# Patient Record
Sex: Male | Born: 1990
Health system: Southern US, Community
[De-identification: ages and names within clinical notes are randomized; demographics above are authoritative.]

## PROBLEM LIST (undated history)

## (undated) DIAGNOSIS — F3181 Bipolar II disorder: Secondary | ICD-10-CM

## (undated) DIAGNOSIS — G4700 Insomnia, unspecified: Secondary | ICD-10-CM

## (undated) DIAGNOSIS — G4730 Sleep apnea, unspecified: Secondary | ICD-10-CM

## (undated) DIAGNOSIS — F32A Depression, unspecified: Secondary | ICD-10-CM

## (undated) DIAGNOSIS — E785 Hyperlipidemia, unspecified: Secondary | ICD-10-CM

## (undated) DIAGNOSIS — F419 Anxiety disorder, unspecified: Secondary | ICD-10-CM

## (undated) DIAGNOSIS — F329 Major depressive disorder, single episode, unspecified: Secondary | ICD-10-CM

## (undated) DIAGNOSIS — F909 Attention-deficit hyperactivity disorder, unspecified type: Secondary | ICD-10-CM

## (undated) HISTORY — DX: Depression, unspecified: F32.A

## (undated) HISTORY — PX: OTHER SURGICAL HISTORY: SHX169

## (undated) HISTORY — DX: Major depressive disorder, single episode, unspecified: F32.9

## (undated) HISTORY — DX: Anxiety disorder, unspecified: F41.9

---
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
## 2011-02-21 ENCOUNTER — Ambulatory Visit (INDEPENDENT_AMBULATORY_CARE_PROVIDER_SITE_OTHER): Payer: BC Managed Care – PPO | Admitting: Internal Medicine

## 2011-02-21 ENCOUNTER — Other Ambulatory Visit (INDEPENDENT_AMBULATORY_CARE_PROVIDER_SITE_OTHER): Payer: BC Managed Care – PPO

## 2011-02-21 ENCOUNTER — Encounter: Payer: Self-pay | Admitting: Internal Medicine

## 2011-02-21 VITALS — BP 108/80 | HR 97 | Temp 97.4°F | Wt 172.0 lb

## 2011-02-21 DIAGNOSIS — Z0000 Encounter for general adult medical examination without abnormal findings: Secondary | ICD-10-CM

## 2011-02-21 DIAGNOSIS — L858 Other specified epidermal thickening: Secondary | ICD-10-CM | POA: Insufficient documentation

## 2011-02-21 DIAGNOSIS — F411 Generalized anxiety disorder: Secondary | ICD-10-CM

## 2011-02-21 DIAGNOSIS — F419 Anxiety disorder, unspecified: Secondary | ICD-10-CM

## 2011-02-21 DIAGNOSIS — Z79899 Other long term (current) drug therapy: Secondary | ICD-10-CM | POA: Insufficient documentation

## 2011-02-21 LAB — CBC WITH DIFFERENTIAL/PLATELET
Basophils Absolute: 0 10*3/uL (ref 0.0–0.1)
Basophils Relative: 0.6 % (ref 0.0–3.0)
Eosinophils Absolute: 0.1 10*3/uL (ref 0.0–0.7)
Eosinophils Relative: 0.8 % (ref 0.0–5.0)
HCT: 44.8 % (ref 39.0–52.0)
Hemoglobin: 15 g/dL (ref 13.0–17.0)
Lymphocytes Relative: 32.4 % (ref 12.0–46.0)
Lymphs Abs: 2.6 10*3/uL (ref 0.7–4.0)
MCHC: 33.5 g/dL (ref 30.0–36.0)
MCV: 86.2 fl (ref 78.0–100.0)
Monocytes Absolute: 0.5 10*3/uL (ref 0.1–1.0)
Monocytes Relative: 6.7 % (ref 3.0–12.0)
Neutro Abs: 4.8 10*3/uL (ref 1.4–7.7)
Neutrophils Relative %: 59.5 % (ref 43.0–77.0)
Platelets: 362 10*3/uL (ref 150.0–400.0)
RBC: 5.2 Mil/uL (ref 4.22–5.81)
RDW: 12.7 % (ref 11.5–14.6)
WBC: 8.1 10*3/uL (ref 4.5–10.5)

## 2011-02-21 LAB — LIPID PANEL
Cholesterol: 198 mg/dL (ref 0–200)
HDL: 46.3 mg/dL (ref >39.00)
LDL Cholesterol: 121 mg/dL — ABNORMAL HIGH (ref 0–99)
Total CHOL/HDL Ratio: 4
Triglycerides: 152 mg/dL — ABNORMAL HIGH (ref 0.0–149.0)
VLDL: 30.4 mg/dL (ref 0.0–40.0)

## 2011-02-21 MED ORDER — AMMONIUM LACTATE 12 % EX LOTN: TOPICAL_LOTION | Freq: Every day | CUTANEOUS | Status: DC

## 2011-02-21 NOTE — Patient Instructions (Signed)
Health Maintenance in Males MAINTAIN REGULAR HEALTH EXAMS  Maintain a healthy diet and normal weight. Increased weight leads to problems with blood pressure and diabetes. Decrease fat in the diet and increase exercise. Obtain a proper diet from your caregiver if necessary.   High blood pressure causes heart and blood vessel problems. Check blood pressures regularly and keep your blood pressure at normal limits. Aerobic exercise helps this. Persistent elevations of blood pressure should be treated with medications if weight loss and exercise are ineffective.   Avoid smoking, drinking in excess (more than 2 drinks per day), or use of street drugs. Do not share needles with anyone. Ask for help if you need assistance or instructions on stopping the use of alcohol, cigarettes, or drugs.   Maintain normal blood lipids and cholesterol. Your caregiver can give you information to lower your risk of heart disease or stroke.   Ask your caregiver if you are in need of early heart disease screening because of a strong family history of heart disease or signs of elevated testosterone (male sex hormone) levels. These can predispose you to early heart disease.   Practice safe sex. Practicing safe sex decreases your risk for a sexually transmitted infection (STI). Some of the STIs are gonorrhea, chlamydia, syphilis, trichimonas, herpes, human papillomavirus (HPV), and human immunodeficiency virus (HIV). Herpes, HIV, and HPV are viral illnesses that have no cure. These can result in disability, cancer, and death.   It is not safe for someone who has AIDS or is HIV positive to have unprotected sex with a partner who is HIV positive. The reason for this is the fact that there are many different strains of HIV. If you have a strain that is readily treated with medications and then suddenly introduce a strain from a partner that has no further treatment options, you may suddenly have a strain of HIV that is untreatable.  Even if you are both positive for HIV, it is still necessary to practice safe sex.   Use sunscreen with a SPF of 15 or greater. Being outside in the sun when your shadow caused by the sun is shorter than you are, means you are being exposed to sun at greater intensity. Lighter skinned people are at a greater risk of skin cancer.   Keep carbon monoxide and smoke detectors in your home and functioning at all times. Change the batteries every 6 months.   Do monthly examinations of your testicles. The best time to do this is after a hot shower or bath when the tissues are loose. Notify your caregivers of any lumps, tenderness, or changes in size or shape.   Notify your caregiver of new moles or changes in moles, especially if there is a change in shape or color. Also notify your caregiver if a mole is larger than the size of a pencil eraser.   Stay current with your tetanus shots and other required immunizations.  The Body Mass Index (BMI) is a way of measuring how much of your body is fat. Having a BMI above 27 increases the risk of heart disease, diabetes, hypertension, stroke, and other problems related to obesity. Document Released: 01/05/2008 Document Re-Released: 12/27/2009 [employer] Patient Information 2011 [employer]. 

## 2011-02-22 LAB — COMPREHENSIVE METABOLIC PANEL
AST: 18 U/L (ref 0–37)
Alkaline Phosphatase: 52 U/L (ref 39–117)
BUN: 10 mg/dL (ref 6–23)
Creatinine, Ser: 0.8 mg/dL (ref 0.4–1.5)
Total Bilirubin: 0.6 mg/dL (ref 0.3–1.2)

## 2011-02-22 NOTE — Assessment & Plan Note (Signed)
Labs ordered.

## 2011-02-22 NOTE — Assessment & Plan Note (Signed)
Exam done, labs ordered, and pt ed material was given 

## 2011-02-22 NOTE — Assessment & Plan Note (Signed)
Try lac-hydrin lotion

## 2011-02-22 NOTE — Progress Notes (Signed)
  Subjective:    Patient ID: Alexander Harrington, male    DOB: 1990/11/16, 20 y.o.   MRN: [id number]  HPI New to me for a complete physical, his only complaint is a long history of rash on his upper arms.    Review of Systems  Constitutional: Negative for fever, chills, diaphoresis, activity change, appetite change, fatigue and unexpected weight change.  HENT: Negative.   Eyes: Negative.   Respiratory: Negative.   Cardiovascular: Negative.   Gastrointestinal: Negative.   Genitourinary: Negative.   Musculoskeletal: Negative.   Skin: Positive for rash. Negative for color change, pallor and wound.  Neurological: Negative.   Hematological: Negative.   Psychiatric/Behavioral: Negative for suicidal ideas, hallucinations, behavioral problems, confusion, sleep disturbance, self-injury, dysphoric mood, decreased concentration and agitation. The patient is nervous/anxious. The patient is not hyperactive.        Objective:   Physical Exam  Vitals reviewed. Constitutional: He is oriented to person, place, and time. He appears well-developed and well-nourished. No distress.  HENT:  Head: Normocephalic and atraumatic.  Right Ear: External ear normal.  Left Ear: External ear normal.  Nose: Nose normal.  Mouth/Throat: Oropharynx is clear and moist. No oropharyngeal exudate.  Eyes: Conjunctivae and EOM are normal. Pupils are equal, round, and reactive to light. Right eye exhibits no discharge. Left eye exhibits no discharge. No scleral icterus.  Neck: Normal range of motion. Neck supple. No JVD present. No tracheal deviation present. No thyromegaly present.  Cardiovascular: Normal rate, regular rhythm, normal heart sounds and intact distal pulses.  Exam reveals no gallop and no friction rub.   No murmur heard. Pulmonary/Chest: Effort normal and breath sounds normal. No stridor. No respiratory distress. He has no wheezes. He has no rales. He exhibits no tenderness.  Abdominal: Soft. Bowel sounds are  normal. He exhibits no distension and no mass. There is no tenderness. There is no rebound and no guarding. Hernia confirmed negative in the right inguinal area and confirmed negative in the left inguinal area.  Genitourinary: Testes normal and penis normal. Right testis shows no mass, no swelling and no tenderness. Right testis is descended. Cremasteric reflex is not absent on the right side. Left testis shows no mass, no swelling and no tenderness. Left testis is descended. Cremasteric reflex is not absent on the left side. Circumcised. No penile tenderness. No discharge found.  Musculoskeletal: Normal range of motion. He exhibits no edema and no tenderness.  Lymphadenopathy:    He has no cervical adenopathy.       Right: No inguinal adenopathy present.       Left: No inguinal adenopathy present.  Neurological: He is alert and oriented to person, place, and time. He has normal reflexes. He displays normal reflexes. No cranial nerve deficit. He exhibits normal muscle tone. Coordination normal.  Skin: Skin is warm, dry and intact. Rash noted. No abrasion, no bruising, no burn, no ecchymosis, no lesion, no petechiae and no purpura noted. Rash is papular. Rash is not macular, not nodular, not pustular, not vesicular and not urticarial. He is not diaphoretic. No cyanosis or erythema. No pallor. Nails show no clubbing.       He has hyperpigmented perifollicular lesions on both of his upper arms  Psychiatric: He has a normal mood and affect. His behavior is normal. Judgment and thought content normal.          Assessment & Plan:

## 2011-02-22 NOTE — Assessment & Plan Note (Signed)
Cont f/up with Dr. Evelene Croon

## 2011-02-23 ENCOUNTER — Encounter: Payer: Self-pay | Admitting: Internal Medicine

## 2011-02-24 LAB — LAMOTRIGINE LEVEL: Lamotrigine Lvl: 7.2 ug/mL (ref 4.0–18.0)

## 2011-11-28 ENCOUNTER — Encounter (HOSPITAL_COMMUNITY): Payer: Self-pay | Admitting: Emergency Medicine

## 2011-11-28 ENCOUNTER — Emergency Department (HOSPITAL_COMMUNITY)
Admission: EM | Admit: 2011-11-28 | Discharge: 2011-11-28 | Disposition: A | Discharge status: Home / Self Care | Payer: BC Managed Care – PPO | Attending: Emergency Medicine | Admitting: Emergency Medicine

## 2011-11-28 ENCOUNTER — Emergency Department (HOSPITAL_COMMUNITY): Payer: BC Managed Care – PPO

## 2011-11-28 DIAGNOSIS — Z79899 Other long term (current) drug therapy: Secondary | ICD-10-CM | POA: Insufficient documentation

## 2011-11-28 DIAGNOSIS — F341 Dysthymic disorder: Secondary | ICD-10-CM | POA: Insufficient documentation

## 2011-11-28 DIAGNOSIS — R05 Cough: Secondary | ICD-10-CM | POA: Insufficient documentation

## 2011-11-28 DIAGNOSIS — R1031 Right lower quadrant pain: Secondary | ICD-10-CM | POA: Insufficient documentation

## 2011-11-28 DIAGNOSIS — R112 Nausea with vomiting, unspecified: Secondary | ICD-10-CM | POA: Insufficient documentation

## 2011-11-28 DIAGNOSIS — R5381 Other malaise: Secondary | ICD-10-CM | POA: Insufficient documentation

## 2011-11-28 DIAGNOSIS — R059 Cough, unspecified: Secondary | ICD-10-CM | POA: Insufficient documentation

## 2011-11-28 DIAGNOSIS — J189 Pneumonia, unspecified organism: Secondary | ICD-10-CM

## 2011-11-28 DIAGNOSIS — R509 Fever, unspecified: Secondary | ICD-10-CM | POA: Insufficient documentation

## 2011-11-28 LAB — BASIC METABOLIC PANEL
BUN: 9 mg/dL (ref 6–23)
Chloride: 101 mEq/L (ref 96–112)
GFR calc Af Amer: >90 mL/min (ref >90)
GFR calc non Af Amer: >90 mL/min (ref >90)
Glucose, Bld: 98 mg/dL (ref 70–99)
Potassium: 3.4 mEq/L — ABNORMAL LOW (ref 3.5–5.1)
Sodium: 136 mEq/L (ref 135–145)

## 2011-11-28 LAB — URINALYSIS, ROUTINE W REFLEX MICROSCOPIC
Hgb urine dipstick: NEGATIVE
Nitrite: NEGATIVE
Specific Gravity, Urine: 1.009 (ref 1.005–1.030)
Urobilinogen, UA: 0.2 mg/dL (ref 0.0–1.0)
pH: 6.5 (ref 5.0–8.0)

## 2011-11-28 LAB — DIFFERENTIAL
Eosinophils Absolute: 0.2 10*3/uL (ref 0.0–0.7)
Lymphocytes Relative: 7 % — ABNORMAL LOW (ref 12–46)
Lymphs Abs: 1.7 10*3/uL (ref 0.7–4.0)
Monocytes Relative: 8 % (ref 3–12)
Neutro Abs: 19.1 10*3/uL — ABNORMAL HIGH (ref 1.7–7.7)
Neutrophils Relative %: 83 % — ABNORMAL HIGH (ref 43–77)

## 2011-11-28 LAB — CBC
Hemoglobin: 14.3 g/dL (ref 13.0–17.0)
MCH: 28.4 pg (ref 26.0–34.0)
Platelets: 324 10*3/uL (ref 150–400)
RBC: 5.03 MIL/uL (ref 4.22–5.81)
WBC: 22.8 10*3/uL — ABNORMAL HIGH (ref 4.0–10.5)

## 2011-11-28 MED ORDER — IOHEXOL 300 MG/ML  SOLN
100.0000 mL | Freq: Once | INTRAMUSCULAR | Status: AC | PRN
  Administered 2011-11-28: 100 mL via INTRAVENOUS

## 2011-11-28 MED ORDER — ONDANSETRON HCL 4 MG/2ML IJ SOLN
4.0000 mg | Freq: Once | INTRAMUSCULAR | Status: AC
  Administered 2011-11-28: 4 mg via INTRAVENOUS
  Filled 2011-11-28: qty 2

## 2011-11-28 MED ORDER — AZITHROMYCIN 250 MG PO TABS: ORAL_TABLET | ORAL | Status: AC

## 2011-11-28 MED ORDER — HYDROMORPHONE HCL PF 1 MG/ML IJ SOLN
1.0000 mg | Freq: Once | INTRAMUSCULAR | Status: AC
  Administered 2011-11-28: 1 mg via INTRAVENOUS
  Filled 2011-11-28: qty 1

## 2011-11-28 MED ORDER — SODIUM CHLORIDE 0.9 % IV BOLUS (SEPSIS)
1000.0000 mL | Freq: Once | INTRAVENOUS | Status: AC
  Administered 2011-11-28: 1000 mL via INTRAVENOUS

## 2011-11-28 MED ORDER — AZITHROMYCIN 250 MG PO TABS
500.0000 mg | ORAL_TABLET | Freq: Once | ORAL | Status: AC
  Administered 2011-11-28: 500 mg via ORAL
  Filled 2011-11-28: qty 2

## 2011-11-28 NOTE — ED Notes (Signed)
Pt transported to CT ?

## 2011-11-28 NOTE — ED Provider Notes (Signed)
Medical screening examination/treatment/procedure(s) were performed by non-physician practitioner and as supervising physician I was immediately available for consultation/collaboration.  Leler Brion, MD 11/28/11 2350 

## 2011-11-28 NOTE — ED Provider Notes (Signed)
History     CSN: [id number]  Arrival date & time 11/28/11  1443   None     Chief Complaint  Patient presents with  . Nausea  . Emesis  . elevated WBC     (Consider location/radiation/quality/duration/timing/severity/associated sxs/prior treatment) HPI  Patient presents to the ED with abdominal pain. He states that he has been having fevers, nausea, RLQ pain, some weakness for the past couple of days. He went to his [facility] clinic and was told that he may have appendicitis and that his white count was elevated and that he needed to go to the ED to be evaluated and get a scan. The patient states that his pain has been intermittent sharp pains. He does have a mild cough.His temperature is 99.4 degrees. He is in no acute distress and is answering questions appropriately.   Past Medical History  Diagnosis Date  . Depression   . Anxiety     Dr. Evelene Croon    History reviewed. No pertinent past surgical history.  Family History  Problem Relation Age of Onset  . Arthritis Other   . Hyperlipidemia Other   . Stroke Other   . Heart disease Other   . Mental illness Other     History  Substance Use Topics  . Smoking status: Never Smoker   . Smokeless tobacco: Not on file  . Alcohol Use: Yes      Review of Systems   HEENT: denies blurry vision or change in hearing PULMONARY: Denies difficulty breathing and SOB CARDIAC: denies chest pain or heart palpitations MUSCULOSKELETAL:  denies being unable to ambulate ABDOMEN AL: denies vomiting and diarrhea GU: denies loss of bowel or urinary control NEURO: denies numbness and tingling in extremities   Allergies  Codeine  Home Medications   Current Outpatient Rx  Name Route Sig Dispense Refill  . CLONAZEPAM 0.5 MG PO TABS Oral Take 0.5 mg by mouth See admin instructions. He can take up to 6 times a day.    . IBUPROFEN 200 MG PO TABS Oral Take 400 mg by mouth every 6 (six) hours as needed. For fever.    Marland Kitchen LITHIUM CARBONATE 300 MG PO  CAPS Oral Take 300-600 mg by mouth See admin instructions. He takes one capsule in the morning and two capsules at bedtime.    Marland Kitchen OLANZAPINE-FLUOXETINE HCL 6-25 MG PO CAPS Oral Take 1-2 capsules by mouth every evening.     . AZITHROMYCIN 250 MG PO TABS  Take 1 tab PO q day for 5 days. 5 each 0    BP 103/65  Pulse 103  Temp(Src) 99.4 F (37.4 C) (Oral)  Resp 18  SpO2 94%  Physical Exam  Nursing note and vitals reviewed. Constitutional: He is oriented to person, place, and time. He appears well-developed and well-nourished. No distress.  HENT:  Head: Normocephalic and atraumatic.  Right Ear: External ear normal.  Left Ear: External ear normal.  Eyes: Conjunctivae and EOM are normal. Pupils are equal, round, and reactive to light.  Neck: Normal range of motion. Neck supple.  Cardiovascular: Normal rate and regular rhythm.   Pulmonary/Chest: Effort normal. He has no wheezes. He has no rales. He exhibits no tenderness.  Abdominal: Soft. He exhibits no mass. There is tenderness (RLQ). There is no rebound and no guarding.  Musculoskeletal: Normal range of motion.  Neurological: He is oriented to person, place, and time.  Skin: Skin is warm and dry.    ED Course  Procedures (including critical care  time)  Labs Reviewed  CBC - Abnormal; Notable for the following:    WBC 22.8 (*)    All other components within normal limits  DIFFERENTIAL - Abnormal; Notable for the following:    Neutrophils Relative 83 (*)    Neutro Abs 19.1 (*)    Lymphocytes Relative 7 (*)    Monocytes Absolute 1.9 (*)    All other components within normal limits  BASIC METABOLIC PANEL - Abnormal; Notable for the following:    Potassium 3.4 (*)    All other components within normal limits  URINALYSIS, ROUTINE W REFLEX MICROSCOPIC   Ct Abdomen Pelvis W Contrast  11/28/2011  *RADIOLOGY REPORT*  Clinical Data: Right lower quadrant pain.  Nausea vomiting.  CT ABDOMEN AND PELVIS WITH CONTRAST  Technique:   Multidetector CT imaging of the abdomen and pelvis was performed following the standard protocol during bolus administration of intravenous contrast.  Contrast: OMNIPAQUE IOHEXOL 300 MG/ML  SOLN  Comparison: None.  Findings: Imaging through the lung bases shows a central patchy alveolar disease in the right middle lobe and both lower lobes. There is confluent airspace disease in the lingula.  The liver, spleen, stomach, duodenum, pancreas, gallbladder, and adrenal glands are unremarkable.  Persistent fetal lobation is seen in the kidneys bilaterally.  No renal stone or mass.  No hydronephrosis.  Bowel loops are unremarkable aside from prominent stool volume in the colon.  No abdominal aortic aneurysm.  There is no free fluid in the abdomen.  An increased number of lymph nodes is seen in the small bowel mesentery although no individual node meets CT criteria for pathologic enlargement.  Imaging through the pelvis shows a distended bladder.  There is no pelvic sidewall lymphadenopathy.  Mild bilateral ureteral fullness is probably related to the distended bladder.  No free fluid in the pelvis.  No substantial diverticular change in the colon.  There is no diverticulitis.  The terminal ileum is normal.  The appendix is normal.  Bone windows reveal no worrisome lytic or sclerotic osseous lesions.  IMPRESSION: No acute findings in the abdomen or pelvis. Prominent stool volume raises the question of clinical constipation.  Patchy airspace disease in both lower lobes with confluent airspace consolidation in the posterior lingula.  Original Report Authenticated By: ERIC A. MANSELL, M.D.     1. Community acquired pneumonia       MDM  Patients CT scan shows that the patient has a bilateral pneumonia and no appendicitis. The patient was given 500mg  Azithromycin in the ED and a Rx for 5 more days of 250mg  Azithromycin. He is to see his PCP Dr. Yetta Barre in two weeks for a repeat xray. He is to come back to the ED or  see his PCP sooner if his symptoms worsen.  Pt has been advised of the symptoms that warrant their return to the ED. Patient has voiced understanding and has agreed to follow-up with the PCP or specialist.        Dorthula Matas, PA 11/28/11 1918

## 2011-11-28 NOTE — ED Notes (Signed)
Pt reports having dizziness, N/V, weakness, headache, blurred vision, decreased appetite, with right-sided abdominal pain and urinary frequency x2-3 days. Pt is A/O x4. Skin warm and dry. Respirations even and unlabored. NAD noted at this time.

## 2011-11-28 NOTE — Discharge Instructions (Signed)

## 2011-12-31 ENCOUNTER — Ambulatory Visit (INDEPENDENT_AMBULATORY_CARE_PROVIDER_SITE_OTHER)
Admission: RE | Admit: 2011-12-31 | Discharge: 2011-12-31 | Disposition: A | Discharge status: Home / Self Care | Payer: BC Managed Care – PPO | Source: Ambulatory Visit | Attending: Internal Medicine | Admitting: Internal Medicine

## 2011-12-31 ENCOUNTER — Other Ambulatory Visit (INDEPENDENT_AMBULATORY_CARE_PROVIDER_SITE_OTHER): Payer: BC Managed Care – PPO

## 2011-12-31 ENCOUNTER — Encounter: Payer: Self-pay | Admitting: Internal Medicine

## 2011-12-31 ENCOUNTER — Ambulatory Visit (INDEPENDENT_AMBULATORY_CARE_PROVIDER_SITE_OTHER): Payer: BC Managed Care – PPO | Admitting: Internal Medicine

## 2011-12-31 VITALS — BP 118/70 | HR 88 | Temp 98.0°F | Resp 20 | Wt 201.8 lb

## 2011-12-31 DIAGNOSIS — Z79899 Other long term (current) drug therapy: Secondary | ICD-10-CM

## 2011-12-31 DIAGNOSIS — R918 Other nonspecific abnormal finding of lung field: Secondary | ICD-10-CM

## 2011-12-31 DIAGNOSIS — R9389 Abnormal findings on diagnostic imaging of other specified body structures: Secondary | ICD-10-CM | POA: Insufficient documentation

## 2011-12-31 DIAGNOSIS — F329 Major depressive disorder, single episode, unspecified: Secondary | ICD-10-CM

## 2011-12-31 DIAGNOSIS — J189 Pneumonia, unspecified organism: Secondary | ICD-10-CM

## 2011-12-31 DIAGNOSIS — F3289 Other specified depressive episodes: Secondary | ICD-10-CM

## 2011-12-31 DIAGNOSIS — D72829 Elevated white blood cell count, unspecified: Secondary | ICD-10-CM

## 2011-12-31 DIAGNOSIS — F32A Depression, unspecified: Secondary | ICD-10-CM | POA: Insufficient documentation

## 2011-12-31 LAB — CBC WITH DIFFERENTIAL/PLATELET
Basophils Absolute: 0 10*3/uL (ref 0.0–0.1)
Eosinophils Absolute: 0.5 10*3/uL (ref 0.0–0.7)
HCT: 45.9 % (ref 39.0–52.0)
Hemoglobin: 15.3 g/dL (ref 13.0–17.0)
Lymphs Abs: 3.3 10*3/uL (ref 0.7–4.0)
MCHC: 33.3 g/dL (ref 30.0–36.0)
MCV: 86.2 fl (ref 78.0–100.0)
Monocytes Absolute: 0.7 10*3/uL (ref 0.1–1.0)
Neutro Abs: 5.6 10*3/uL (ref 1.4–7.7)
RDW: 13.5 % (ref 11.5–14.6)

## 2011-12-31 LAB — COMPREHENSIVE METABOLIC PANEL
AST: 33 U/L (ref 0–37)
Albumin: 4.4 g/dL (ref 3.5–5.2)
Alkaline Phosphatase: 49 U/L (ref 39–117)
Calcium: 9.1 mg/dL (ref 8.4–10.5)
Chloride: 104 mEq/L (ref 96–112)
Potassium: 4.1 mEq/L (ref 3.5–5.1)
Sodium: 140 mEq/L (ref 135–145)
Total Protein: 8 g/dL (ref 6.0–8.3)

## 2011-12-31 NOTE — Assessment & Plan Note (Signed)
CXR today.  

## 2011-12-31 NOTE — Assessment & Plan Note (Signed)
I will check his CXR today 

## 2011-12-31 NOTE — Progress Notes (Signed)
Subjective:    Patient ID: Alexander Harrington, male    DOB: May 19, 1991, 21 y.o.   MRN: [id number]  HPI He returns for f/up after a recent ER visit where he was treated for PNA with a Zpak. He feels well today and he offers no complaints.   Review of Systems  Constitutional: Negative for fever, chills, diaphoresis, activity change, appetite change, fatigue and unexpected weight change.  HENT: Negative.   Eyes: Negative.   Respiratory: Negative for apnea, cough, chest tightness, shortness of breath, wheezing and stridor.   Cardiovascular: Negative for chest pain, palpitations and leg swelling.  Gastrointestinal: Negative for nausea, vomiting, abdominal pain, diarrhea, constipation, blood in stool and abdominal distention.  Genitourinary: Negative.   Musculoskeletal: Negative for myalgias, back pain, joint swelling, arthralgias and gait problem.  Skin: Negative for color change, pallor, rash and wound.  Neurological: Negative.   Hematological: Negative for adenopathy. Does not bruise/bleed easily.  Psychiatric/Behavioral: Negative.        Objective:   Physical Exam  Vitals reviewed. Constitutional: He is oriented to person, place, and time. He appears well-developed and well-nourished. No distress.  HENT:  Head: Normocephalic and atraumatic.  Mouth/Throat: Oropharynx is clear and moist. No oropharyngeal exudate.  Eyes: Conjunctivae are normal. Right eye exhibits no discharge. Left eye exhibits no discharge. No scleral icterus.  Neck: Normal range of motion. Neck supple. No JVD present. No tracheal deviation present. No thyromegaly present.  Cardiovascular: Normal rate, regular rhythm, normal heart sounds and intact distal pulses.  Exam reveals no gallop and no friction rub.   No murmur heard. Pulmonary/Chest: Effort normal and breath sounds normal. No stridor. No respiratory distress. He has no wheezes. He has no rales. He exhibits no tenderness.  Abdominal: Soft. Bowel sounds are normal.  He exhibits no distension and no mass. There is no tenderness. There is no rebound and no guarding.  Musculoskeletal: Normal range of motion. He exhibits no tenderness.  Lymphadenopathy:    He has no cervical adenopathy.  Neurological: He is oriented to person, place, and time.  Skin: Skin is warm and dry. No rash noted. He is not diaphoretic. No erythema. No pallor.  Psychiatric: He has a normal mood and affect. His behavior is normal. Judgment and thought content normal.     Lab Results  Component Value Date   WBC 22.8* 11/28/2011   HGB 14.3 11/28/2011   HCT 42.4 11/28/2011   PLT 324 11/28/2011   GLUCOSE 98 11/28/2011   CHOL 198 02/21/2011   TRIG 152.0* 02/21/2011   HDL 46.30 02/21/2011   LDLCALC 121* 02/21/2011   ALT 15 02/21/2011   AST 18 02/21/2011   NA 136 11/28/2011   K 3.4* 11/28/2011   CL 101 11/28/2011   CREATININE 0.82 11/28/2011   BUN 9 11/28/2011   CO2 24 11/28/2011   TSH 1.71 02/21/2011   Ct Abdomen Pelvis W Contrast  11/28/2011  *RADIOLOGY REPORT*  Clinical Data: Right lower quadrant pain.  Nausea vomiting.  CT ABDOMEN AND PELVIS WITH CONTRAST  Technique:  Multidetector CT imaging of the abdomen and pelvis was performed following the standard protocol during bolus administration of intravenous contrast.  Contrast: OMNIPAQUE IOHEXOL 300 MG/ML  SOLN  Comparison: None.  Findings: Imaging through the lung bases shows a central patchy alveolar disease in the right middle lobe and both lower lobes. There is confluent airspace disease in the lingula.  The liver, spleen, stomach, duodenum, pancreas, gallbladder, and adrenal glands are unremarkable.  Persistent fetal lobation  is seen in the kidneys bilaterally.  No renal stone or mass.  No hydronephrosis.  Bowel loops are unremarkable aside from prominent stool volume in the colon.  No abdominal aortic aneurysm.  There is no free fluid in the abdomen.  An increased number of lymph nodes is seen in the small bowel mesentery although no individual node meets CT  criteria for pathologic enlargement.  Imaging through the pelvis shows a distended bladder.  There is no pelvic sidewall lymphadenopathy.  Mild bilateral ureteral fullness is probably related to the distended bladder.  No free fluid in the pelvis.  No substantial diverticular change in the colon.  There is no diverticulitis.  The terminal ileum is normal.  The appendix is normal.  Bone windows reveal no worrisome lytic or sclerotic osseous lesions.  IMPRESSION: No acute findings in the abdomen or pelvis. Prominent stool volume raises the question of clinical constipation.  Patchy airspace disease in both lower lobes with confluent airspace consolidation in the posterior lingula.  Original Report Authenticated By: ERIC A. MANSELL, M.D.     Assessment & Plan:

## 2011-12-31 NOTE — Assessment & Plan Note (Signed)
In light of the lithium therapy I will check his lytes, renal function, and thyroid function

## 2011-12-31 NOTE — Patient Instructions (Signed)

## 2011-12-31 NOTE — Assessment & Plan Note (Signed)
He appears to be doing well.  

## 2011-12-31 NOTE — Assessment & Plan Note (Signed)
For a repeat CBC today 

## 2012-01-01 ENCOUNTER — Encounter: Payer: Self-pay | Admitting: Internal Medicine

## 2012-04-07 ENCOUNTER — Encounter (HOSPITAL_COMMUNITY): Payer: Self-pay

## 2012-04-07 ENCOUNTER — Emergency Department (HOSPITAL_COMMUNITY)
Admission: EM | Admit: 2012-04-07 | Discharge: 2012-04-08 | Disposition: A | Discharge status: Other Acute Inpatient Hospital | Payer: BC Managed Care – PPO | Attending: Emergency Medicine | Admitting: Emergency Medicine

## 2012-04-07 DIAGNOSIS — R45851 Suicidal ideations: Secondary | ICD-10-CM | POA: Insufficient documentation

## 2012-04-07 DIAGNOSIS — F329 Major depressive disorder, single episode, unspecified: Secondary | ICD-10-CM | POA: Insufficient documentation

## 2012-04-07 DIAGNOSIS — F3289 Other specified depressive episodes: Secondary | ICD-10-CM | POA: Insufficient documentation

## 2012-04-07 DIAGNOSIS — F32A Depression, unspecified: Secondary | ICD-10-CM

## 2012-04-07 HISTORY — DX: Bipolar II disorder: F31.81

## 2012-04-07 LAB — COMPREHENSIVE METABOLIC PANEL
ALT: 26 U/L (ref 0–53)
AST: 23 U/L (ref 0–37)
Albumin: 4.5 g/dL (ref 3.5–5.2)
Alkaline Phosphatase: 68 U/L (ref 39–117)
BUN: 10 mg/dL (ref 6–23)
CO2: 26 mEq/L (ref 19–32)
Calcium: 9.4 mg/dL (ref 8.4–10.5)
Chloride: 99 mEq/L (ref 96–112)
Creatinine, Ser: 0.99 mg/dL (ref 0.50–1.35)
GFR calc Af Amer: >90 mL/min (ref >90)
GFR calc non Af Amer: >90 mL/min (ref >90)
Glucose, Bld: 95 mg/dL (ref 70–99)
Potassium: 3.5 mEq/L (ref 3.5–5.1)
Sodium: 136 mEq/L (ref 135–145)
Total Bilirubin: 0.4 mg/dL (ref 0.3–1.2)
Total Protein: 8 g/dL (ref 6.0–8.3)

## 2012-04-07 LAB — RAPID URINE DRUG SCREEN, HOSP PERFORMED
Amphetamines: POSITIVE — AB
Barbiturates: NOT DETECTED
Benzodiazepines: NOT DETECTED
Cocaine: NOT DETECTED
Opiates: NOT DETECTED
Tetrahydrocannabinol: NOT DETECTED

## 2012-04-07 LAB — URINALYSIS, ROUTINE W REFLEX MICROSCOPIC
Bilirubin Urine: NEGATIVE
Glucose, UA: NEGATIVE mg/dL
Hgb urine dipstick: NEGATIVE
Ketones, ur: NEGATIVE mg/dL
Nitrite: NEGATIVE
Protein, ur: NEGATIVE mg/dL
Specific Gravity, Urine: 1.015 (ref 1.005–1.030)
Urobilinogen, UA: 0.2 mg/dL (ref 0.0–1.0)
pH: 7 (ref 5.0–8.0)

## 2012-04-07 LAB — CBC WITH DIFFERENTIAL/PLATELET
Basophils Absolute: 0.1 10*3/uL (ref 0.0–0.1)
Basophils Relative: 1 % (ref 0–1)
Eosinophils Absolute: 0.2 10*3/uL (ref 0.0–0.7)
Eosinophils Relative: 2 % (ref 0–5)
HCT: 45.7 % (ref 39.0–52.0)
Hemoglobin: 15.3 g/dL (ref 13.0–17.0)
Lymphocytes Relative: 21 % (ref 12–46)
Lymphs Abs: 2.5 10*3/uL (ref 0.7–4.0)
MCH: 27.9 pg (ref 26.0–34.0)
MCHC: 33.5 g/dL (ref 30.0–36.0)
MCV: 83.4 fL (ref 78.0–100.0)
Monocytes Absolute: 0.9 10*3/uL (ref 0.1–1.0)
Monocytes Relative: 8 % (ref 3–12)
Neutro Abs: 7.9 10*3/uL — ABNORMAL HIGH (ref 1.7–7.7)
Neutrophils Relative %: 69 % (ref 43–77)
Platelets: 465 10*3/uL — ABNORMAL HIGH (ref 150–400)
RBC: 5.48 MIL/uL (ref 4.22–5.81)
RDW: 11.7 % (ref 11.5–15.5)
WBC: 11.6 10*3/uL — ABNORMAL HIGH (ref 4.0–10.5)

## 2012-04-07 LAB — URINE MICROSCOPIC-ADD ON

## 2012-04-07 LAB — ETHANOL: Alcohol, Ethyl (B): <11 mg/dL (ref 0–11)

## 2012-04-07 LAB — LITHIUM LEVEL: Lithium Lvl: 0.35 mEq/L — ABNORMAL LOW (ref 0.80–1.40)

## 2012-04-07 MED ORDER — LITHIUM CARBONATE 300 MG PO CAPS: 300.0000 mg | ORAL_CAPSULE | ORAL | Status: DC

## 2012-04-07 MED ORDER — LORAZEPAM 1 MG PO TABS: 1.0000 mg | ORAL_TABLET | Freq: Three times a day (TID) | ORAL | Status: DC | PRN

## 2012-04-07 MED ORDER — IBUPROFEN 200 MG PO TABS: 600.0000 mg | ORAL_TABLET | Freq: Three times a day (TID) | ORAL | Status: DC | PRN

## 2012-04-07 MED ORDER — LITHIUM CARBONATE 300 MG PO CAPS
300.0000 mg | ORAL_CAPSULE | Freq: Every day | ORAL | Status: DC
  Administered 2012-04-08: 300 mg via ORAL
  Filled 2012-04-07: qty 1

## 2012-04-07 MED ORDER — CLONAZEPAM 0.5 MG PO TABS
1.0000 mg | ORAL_TABLET | Freq: Three times a day (TID) | ORAL | Status: DC | PRN
  Administered 2012-04-07 (×2): 1 mg via ORAL
  Filled 2012-04-07 (×2): qty 2

## 2012-04-07 MED ORDER — BUPROPION HCL ER (XL) 300 MG PO TB24
450.0000 mg | ORAL_TABLET | Freq: Every day | ORAL | Status: DC
  Administered 2012-04-07 – 2012-04-08 (×2): 450 mg via ORAL
  Filled 2012-04-07 (×2): qty 1

## 2012-04-07 MED ORDER — ONDANSETRON HCL 4 MG PO TABS: 4.0000 mg | ORAL_TABLET | Freq: Three times a day (TID) | ORAL | Status: DC | PRN

## 2012-04-07 MED ORDER — ACETAMINOPHEN 325 MG PO TABS: 650.0000 mg | ORAL_TABLET | ORAL | Status: DC | PRN

## 2012-04-07 MED ORDER — LURASIDONE HCL 40 MG PO TABS
120.0000 mg | ORAL_TABLET | Freq: Every day | ORAL | Status: DC
  Administered 2012-04-07: 120 mg via ORAL
  Filled 2012-04-07 (×2): qty 3

## 2012-04-07 MED ORDER — ZOLPIDEM TARTRATE 5 MG PO TABS
5.0000 mg | ORAL_TABLET | Freq: Every evening | ORAL | Status: DC | PRN
  Administered 2012-04-07: 5 mg via ORAL
  Filled 2012-04-07: qty 1

## 2012-04-07 MED ORDER — LITHIUM CARBONATE 300 MG PO CAPS
600.0000 mg | ORAL_CAPSULE | Freq: Every day | ORAL | Status: DC
  Administered 2012-04-07: 600 mg via ORAL
  Filled 2012-04-07: qty 2

## 2012-04-07 NOTE — ED Notes (Signed)
Pt has one bag of personal belongings locked in locker 26,

## 2012-04-07 NOTE — ED Provider Notes (Signed)
History    21yM with depression and SI. Hx of depression, but worsening. Recently transferred from [employer] to [place]. States he feels like his friends are more distant and he is having trouble adjusting. Was living by self until transferred and now living with parents. States he is taking his medications like he is supposed to. Reports feeling hazy at times and states he is having trouble remembering things, especially when he is trying to study. Reports thoughts of suicide. States over the summer he was planning on using the money he earned from his summer job to buy a handgun. States he never followed through with his plan. States he would usually just cry it out or try to sleep it off, but thoughts intensifying  CSN: [id number]  Arrival date & time 04/07/12  1229   First MD Initiated Contact with Patient 04/07/12 1416      Chief Complaint  Patient presents with  . Suicidal  . Medical Clearance    (Consider location/radiation/quality/duration/timing/severity/associated sxs/prior treatment) HPI  Past Medical History  Diagnosis Date  . Depression   . Anxiety     Dr. Evelene Croon  . Bipolar 2 disorder     History reviewed. No pertinent past surgical history.  Family History  Problem Relation Age of Onset  . Arthritis Other   . Hyperlipidemia Other   . Stroke Other   . Heart disease Other   . Mental illness Other     History  Substance Use Topics  . Smoking status: Never Smoker   . Smokeless tobacco: Never Used  . Alcohol Use: Yes     occasionally      Review of Systems   Review of symptoms negative unless otherwise noted in HPI.   Allergies  Codeine  Home Medications   Current Outpatient Rx  Name Route Sig Dispense Refill  . BUPROPION HCL ER (XL) 150 MG PO TB24 Oral Take 450 mg by mouth daily.    Marland Kitchen CLONAZEPAM 0.5 MG PO TABS Oral Take 1 mg by mouth 3 (three) times daily as needed. He can take up to 6 times a day, as needed for anxiety    . IBUPROFEN 200  MG PO TABS Oral Take 400 mg by mouth every 6 (six) hours as needed. Headache, pain    . LITHIUM CARBONATE 300 MG PO CAPS Oral Take 300-600 mg by mouth See admin instructions. He takes one capsule in the morning and two capsules at bedtime.    Marland Kitchen LURASIDONE HCL 120 MG PO TABS Oral Take 1 tablet by mouth at bedtime.      BP 142/89  Pulse 102  Temp 98.7 F (37.1 C) (Oral)  Resp 18  SpO2 100%  Physical Exam  Nursing note and vitals reviewed. Constitutional: He appears well-developed and well-nourished. No distress.  HENT:  Head: Normocephalic and atraumatic.  Eyes: Conjunctivae normal are normal. Right eye exhibits no discharge. Left eye exhibits no discharge.  Neck: Neck supple.  Cardiovascular: Normal rate, regular rhythm and normal heart sounds.  Exam reveals no gallop and no friction rub.   No murmur heard. Pulmonary/Chest: Effort normal and breath sounds normal. No respiratory distress.  Abdominal: Soft. He exhibits no distension. There is no tenderness.  Musculoskeletal: He exhibits no edema and no tenderness.  Neurological: He is alert.  Skin: Skin is warm and dry.  Psychiatric: Thought content normal.       Speech is clear and content is appropriate. Affect is flat. Does not appear  to be responding to internal stimuli. No evidence of significant cognitive impairment.    ED Course  Procedures (including critical care time)  Labs Reviewed  CBC WITH DIFFERENTIAL - Abnormal; Notable for the following:    WBC 11.6 (*)     Platelets 465 (*)     Neutro Abs 7.9 (*)     All other components within normal limits  URINE RAPID DRUG SCREEN (HOSP PERFORMED) - Abnormal; Notable for the following:    Amphetamines POSITIVE (*)     All other components within normal limits  URINALYSIS, ROUTINE W REFLEX MICROSCOPIC - Abnormal; Notable for the following:    Leukocytes, UA TRACE (*)     All other components within normal limits  LITHIUM LEVEL - Abnormal; Notable for the following:     Lithium Lvl 0.35 (*)     All other components within normal limits  COMPREHENSIVE METABOLIC PANEL  ETHANOL  URINE MICROSCOPIC-ADD ON   No results found.   1. Depression   2. Suicidal ideation       MDM  21 year old male with severe depression and suicidal thoughts. W/u initiated. Will obtain telepsych consult.        Raeford Razor, MD 04/07/12 1650

## 2012-04-07 NOTE — ED Notes (Signed)
Sitter at bedside.

## 2012-04-07 NOTE — ED Notes (Signed)
Pt's father at bedside. NAD noted at this time.

## 2012-04-07 NOTE — ED Notes (Signed)
Hamid Broerman(pt's father) home number:[phone] and the cell number: [phone]

## 2012-04-07 NOTE — ED Notes (Signed)
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 States since he transferred he had to move back home with his parents. States he feels like his friends are more distant and he is having trouble adjusting. States he has been battling severe depression for a long time. States he is taking his medications like he is supposed to. Reports feeling hazy at times and states he is having trouble remembering things, especially when he is trying to study. Reports thoughts of suicide. States over the summer he was planning on using the money he earned from his summer job to buy a handgun.  States he never followed through with his plan. States he would usually just cry it out or try to sleep it off, but this time the depression has been worse and he decided to get some help. Pt went to counseling center at [facility] and they referred him here.  Pt denies HI or hallucinations.

## 2012-04-07 NOTE — ED Notes (Signed)
Dr. Reece Leader called from [facility] and states he will call back when he is ready for tele-psych. States pt is 3rd on his list.

## 2012-04-07 NOTE — ED Notes (Signed)
Patient reports that he is "miserable" When asked to explain stated "opposite of happy."  Patient states he had a plan for suicide, but did not want to talk about it. Patient has had previous attempts by smashing his face into glass objects like picture frames, doors, etc. And cutting his wrists.  Patient also reports that he was banging his today on a window, table, and a wall.

## 2012-04-07 NOTE — ED Notes (Signed)
Dr. Kohut at bedside 

## 2012-04-07 NOTE — ED Notes (Signed)
Family at bedside. 

## 2012-04-07 NOTE — ED Notes (Signed)
SOC notified of need for telepsych. Waiting for callback.

## 2012-04-08 ENCOUNTER — Encounter (HOSPITAL_COMMUNITY): Payer: Self-pay | Admitting: *Deleted

## 2012-04-08 ENCOUNTER — Inpatient Hospital Stay (HOSPITAL_COMMUNITY)
Admission: AD | Admit: 2012-04-08 | Discharge: 2012-04-11 | DRG: 430 | Disposition: A | Discharge status: Home / Self Care | Payer: BC Managed Care – PPO | Attending: Psychiatry | Admitting: Psychiatry

## 2012-04-08 DIAGNOSIS — F1994 Other psychoactive substance use, unspecified with psychoactive substance-induced mood disorder: Secondary | ICD-10-CM | POA: Diagnosis present

## 2012-04-08 DIAGNOSIS — F419 Anxiety disorder, unspecified: Secondary | ICD-10-CM

## 2012-04-08 DIAGNOSIS — Z79899 Other long term (current) drug therapy: Secondary | ICD-10-CM

## 2012-04-08 DIAGNOSIS — F332 Major depressive disorder, recurrent severe without psychotic features: Principal | ICD-10-CM

## 2012-04-08 DIAGNOSIS — F411 Generalized anxiety disorder: Secondary | ICD-10-CM | POA: Diagnosis present

## 2012-04-08 MED ORDER — LURASIDONE HCL 40 MG PO TABS
120.0000 mg | ORAL_TABLET | Freq: Every day | ORAL | Status: DC
  Administered 2012-04-08: 120 mg via ORAL
  Filled 2012-04-08 (×4): qty 3

## 2012-04-08 MED ORDER — LITHIUM CARBONATE 300 MG PO CAPS: 300.0000 mg | ORAL_CAPSULE | ORAL | Status: DC

## 2012-04-08 MED ORDER — ALUM & MAG HYDROXIDE-SIMETH 200-200-20 MG/5ML PO SUSP: 30.0000 mL | ORAL | Status: DC | PRN

## 2012-04-08 MED ORDER — ACETAMINOPHEN 325 MG PO TABS
650.0000 mg | ORAL_TABLET | Freq: Four times a day (QID) | ORAL | Status: DC | PRN
  Administered 2012-04-09: 650 mg via ORAL

## 2012-04-08 MED ORDER — BUPROPION HCL ER (XL) 150 MG PO TB24
450.0000 mg | ORAL_TABLET | Freq: Every day | ORAL | Status: DC
  Administered 2012-04-08 – 2012-04-11 (×4): 450 mg via ORAL
  Filled 2012-04-08 (×9): qty 1

## 2012-04-08 MED ORDER — IBUPROFEN 200 MG PO TABS: 400.0000 mg | ORAL_TABLET | Freq: Four times a day (QID) | ORAL | Status: DC | PRN

## 2012-04-08 MED ORDER — TRAZODONE HCL 50 MG PO TABS
50.0000 mg | ORAL_TABLET | Freq: Every evening | ORAL | Status: DC | PRN
  Administered 2012-04-08 – 2012-04-10 (×5): 50 mg via ORAL
  Filled 2012-04-08 (×2): qty 6
  Filled 2012-04-08 (×8): qty 1

## 2012-04-08 MED ORDER — MAGNESIUM HYDROXIDE 400 MG/5ML PO SUSP: 30.0000 mL | Freq: Every day | ORAL | Status: DC | PRN

## 2012-04-08 MED ORDER — HYDROXYZINE HCL 25 MG PO TABS: 25.0000 mg | ORAL_TABLET | Freq: Three times a day (TID) | ORAL | Status: DC | PRN

## 2012-04-08 MED ORDER — NICOTINE 21 MG/24HR TD PT24: 21.0000 mg | MEDICATED_PATCH | Freq: Every day | TRANSDERMAL | Status: DC

## 2012-04-08 NOTE — ED Provider Notes (Addendum)
Resting easily.  Stable vitals.  Psych recommends inpatient for now, increase wellbutrin to 450 mg QD which has been done.  Awaiting placement.  Alexander Harrington. [name], MD 04/08/12 0839    12:30 PM Pt accepted at [facility] by Dr. Dorathy Daft. Micahel Omlor, MD 04/08/12 1231

## 2012-04-08 NOTE — BH Assessment (Signed)
Assessment Note   Alexander Harrington is a 21 y.o. male who presents to [facility] with SI and plan to shoot self.  This writer attempted to assess pt, however pt was too drowsy and sleeping, the info gathered is collateral from parents and telepsych consult.  Pt states that he recently transferred from [facility] [employer] [employer] to [place].  Pt is now living with his parents and was living alone previously and also left his friends at [employer].  Pt says she feels distant from them and having trouble adjusting.  Pt has long hx of depression and is compliant with meds.  Pt reports decrease in memory and "feeling hazy" at times.  Pt told the that he has a current plan but did not disclose details, however pt says during the summer he was going to buy a gun with the money he made from a working.  Pt never purchased gun.  Pt says depression is worsening and to cope he usually cries or sleeps.  Telepsych completed and recommends inpt admission for stabalization.    Axis I: Major Depression, Recurrent severe Axis II: Deferred Axis III:  Past Medical History  Diagnosis Date  . Depression   . Anxiety     Dr. Evelene Croon  . Bipolar 2 disorder    Axis IV: other psychosocial or environmental problems and problems related to social environment Axis V: 41-50 serious symptoms  Past Medical History:  Past Medical History  Diagnosis Date  . Depression   . Anxiety     Dr. Evelene Croon  . Bipolar 2 disorder     History reviewed. No pertinent past surgical history.  Family History:  Family History  Problem Relation Age of Onset  . Arthritis Other   . Hyperlipidemia Other   . Stroke Other   . Heart disease Other   . Mental illness Other     Social History:  reports that he has never smoked. He has never used smokeless tobacco. He reports that he drinks alcohol. He reports that he does not use illicit drugs.  Additional Social History:  Alcohol / Drug Use Pain Medications: None  Prescriptions: None  Over the Counter:  None  History of alcohol / drug use?: Yes Longest period of sobriety (when/how long): None   CIWA: CIWA-Ar BP: 133/81 mmHg Pulse Rate: 115  COWS:    Allergies:  Allergies  Allergen Reactions  . Codeine Rash    Home Medications:  (Not in a hospital admission)  OB/GYN Status:  No LMP for male patient.  General Assessment Data Location of Assessment: [facility] Living Arrangements: Parent Can pt return to current living arrangement?: Yes Admission Status: Voluntary Is patient capable of signing voluntary admission?: Yes Transfer from: Acute Hospital Referral Source: MD  Education Status Is patient currently in school?: Yes Current Grade: Junior  Highest grade of school patient has completed: Sophomore  Name of school: Education officer, community person: None   Risk to self Suicidal Ideation: Yes-Currently Present Suicidal Intent: Yes-Currently Present Is patient at risk for suicide?: Yes Suicidal Plan?: Yes-Currently Present Specify Current Suicidal Plan: Shoot self with gun  Access to Means: Yes Specify Access to Suicidal Means: Pt was going to use money earned from summer job to buy gun  What has been your use of drugs/alcohol within the last 12 months?: Pt denies  Previous Attempts/Gestures: No How many times?: 0  Other Self Harm Risks: None  Triggers for Past Attempts: Unknown Intentional Self Injurious Behavior: None Family  Suicide History: No Recent stressful life event(s): Other (Comment) (Adjustment to new school and living with parents) Persecutory voices/beliefs?: No Depression: Yes Depression Symptoms: Loss of interest in usual pleasures Substance abuse history and/or treatment for substance abuse?: No Suicide prevention information given to non-admitted patients: Not applicable  Risk to Others Homicidal Ideation: No Thoughts of Harm to Others: No Current Homicidal Intent: No Current Homicidal Plan: No Access to Homicidal Means: No Identified Victim: None    History of harm to others?: No Assessment of Violence: None Noted Violent Behavior Description: None  Does patient have access to weapons?: No Criminal Charges Pending?: No Does patient have a court date: No  Psychosis Hallucinations: None noted Delusions: None noted  Mental Status Report Appear/Hygiene: Other (Comment) (Appropriate ) Eye Contact: Poor Motor Activity: Unremarkable Speech: Logical/coherent Level of Consciousness: Drowsy;Sleeping Mood: Depressed;Sad Affect: Depressed;Sad Anxiety Level: None Thought Processes: Coherent;Relevant Judgement: Impaired Orientation: Person;Place;Time;Situation Obsessive Compulsive Thoughts/Behaviors: None  Cognitive Functioning Concentration: Normal Memory: Recent Intact;Remote Intact IQ: Average Insight: Poor Impulse Control: Poor Appetite: Fair Weight Loss: 0  Weight Gain: 0  Sleep: Decreased Total Hours of Sleep:  (Unk ) Vegetative Symptoms: None  ADLScreening [facility] Assessment Services) Patient's cognitive ability adequate to safely complete daily activities?: Yes Patient able to express need for assistance with ADLs?: Yes Independently performs ADLs?: Yes (appropriate for developmental age)  Abuse/Neglect [facility]) Physical Abuse: Denies Verbal Abuse: Denies Sexual Abuse: Denies  Prior Inpatient Therapy Prior Inpatient Therapy: No Prior Therapy Dates: None  Prior Therapy Facilty/Provider(s): None  Reason for Treatment: None   Prior Outpatient Therapy Prior Outpatient Therapy: No Prior Therapy Dates: None  Prior Therapy Facilty/Provider(s): None  Reason for Treatment: None   ADL Screening (condition at time of admission) Patient's cognitive ability adequate to safely complete daily activities?: Yes Patient able to express need for assistance with ADLs?: Yes Independently performs ADLs?: Yes (appropriate for developmental age) Weakness of Legs: None Weakness of Arms/Hands: None  Home Assistive  Devices/Equipment Home Assistive Devices/Equipment: None  Therapy Consults (therapy consults require a physician order) PT Evaluation Needed: No OT Evalulation Needed: No SLP Evaluation Needed: No Abuse/Neglect Assessment (Assessment to be complete while patient is alone) Physical Abuse: Denies Verbal Abuse: Denies Sexual Abuse: Denies Exploitation of patient/patient's resources: Denies Self-Neglect: Denies Values / Beliefs Cultural Requests During Hospitalization: None Spiritual Requests During Hospitalization: None Consults Spiritual Care Consult Needed: No Social Work Consult Needed: No Merchant navy officer (For Healthcare) Advance Directive: Patient does not have advance directive;Patient would not like information Pre-existing out of facility DNR order (yellow form or pink MOST form): No Nutrition Screen- [facility] Adult/[facility] Patient's home diet: Regular Have you recently lost weight without trying?: No Have you been eating poorly because of a decreased appetite?: No Malnutrition Screening Tool Score: 0   Additional Information 1:1 In Past 12 Months?: No CIRT Risk: No Elopement Risk: No Does patient have medical clearance?: Yes     Disposition:  Disposition Disposition of Patient: Inpatient treatment program;Referred to [facility] ) Type of inpatient treatment program: Adult Patient referred to: Other (Comment) [facility] )  On Site Evaluation by:   Reviewed with Physician:     Murrell Redden 04/08/2012 4:40 AM

## 2012-04-08 NOTE — BHH Counselor (Signed)
Pt accepted by Dr. Georgiana Spinner. Dan Humphreys [phone]. Support paperwork completed.

## 2012-04-08 NOTE — Progress Notes (Signed)
Patient ID: Alexander Harrington, male   DOB: Dec 11, 1990, 21 y.o.   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 Pt c/o increased anxiety and depression since changing his medications from Cymblax to Latuda 2 weeks ago and is also having difficulty with concentration and insomnia. Pt also takes lithium and reports fuzzy thinking and poor memory. He feels helpless and hopeless with crying spells and panic attacks and mania. Pt affect is flat and anxious and his mood is depressed but he is cooperative with staff. Pt has a hx bipolar, anxiety and depression. Pt endorses passive SI but contracts for safety. Writer oriented pt to [facility] and pt hand boook was provided. 15 minute checks were initiated for safety.

## 2012-04-08 NOTE — Progress Notes (Signed)
Trayvion denied SI and HI.  Denied A/V hallucinations.  Denied pain.   Patient's family visited tonight.   Went to recreation and dining room for dinner.

## 2012-04-08 NOTE — Progress Notes (Signed)
Psychoeducational Group Note  Date:  04/08/2012 Time:  2000  Group Topic/Focus:  Wrap-Up Group:   The focus of this group is to help patients review their daily goal of treatment and discuss progress on daily workbooks.  Participation Level:  Minimal  Participation Quality:  Attentive  Affect:  Depressed and Flat  Cognitive:  Oriented  Insight:  Limited  Engagement in Group:  Limited  Additional Comments:  Patient shared that he is apprehensive about what will happen tomorrow and that he will try to attend groups and that he was not able to attend any groups since he only arrived at the hospital late this afternoon.  Tasheem Elms, Newton Pigg 04/08/2012, 9:34 PM

## 2012-04-08 NOTE — Tx Team (Signed)
Initial Interdisciplinary Treatment Plan  PATIENT STRENGTHS: (choose at least two) Ability for insight Average or above average intelligence Communication skills General fund of knowledge Motivation for treatment/growth Physical Health Supportive family/friends  PATIENT STRESSORS: Educational concerns Loss of Girlfriend* Medication change or noncompliance   PROBLEM LIST: Problem List/Patient Goals Date to be addressed Date deferred Reason deferred Estimated date of resolution  Anxiety 04/08/2012     Depression 04/08/2012     Suicidal Ideation 04/08/2012     Mental Illness 04/08/2012                                    DISCHARGE CRITERIA:  Ability to meet basic life and health needs Improved stabilization in mood, thinking, and/or behavior Motivation to continue treatment in a less acute level of care Reduction of life-threatening or endangering symptoms to within safe limits Verbal commitment to aftercare and medication compliance  PRELIMINARY DISCHARGE PLAN: Attend aftercare/continuing care group Outpatient therapy Participate in family therapy Return to previous living arrangement  PATIENT/FAMIILY INVOLVEMENT: This treatment plan has been presented to and reviewed with the patient, Alexander Harrington, and/or family member.  The patient and family have been given the opportunity to ask questions and make suggestions.  Alexander Harrington Alexander Harrington 04/08/2012, 4:11 PM

## 2012-04-08 NOTE — Progress Notes (Signed)
Pt is new admit to the unit this afternoon.  Pt reports he has had increasing depression since changing schools.  He now lives with his parents.  He has not had any contact with his girlfriend since he changed schools and it "feels like she has died".  There are financial issues involved also.  Pt went to the psychologist at [facility] who took him to the ED.  Pt says he was having crying spells and thoughts to kill himself.  Pt has been appropriate on the unit.  He contracts for safety.  Pt encouraged to make his needs known to staff.  He voices no needs/concerns at this time.  Safety maintained with q15 minute checks.

## 2012-04-09 DIAGNOSIS — F332 Major depressive disorder, recurrent severe without psychotic features: Secondary | ICD-10-CM | POA: Diagnosis present

## 2012-04-09 MED ORDER — RISPERIDONE 0.25 MG PO TABS
0.2500 mg | ORAL_TABLET | Freq: Three times a day (TID) | ORAL | Status: DC
  Administered 2012-04-10 – 2012-04-11 (×5): 0.25 mg via ORAL
  Filled 2012-04-09 (×2): qty 1
  Filled 2012-04-09 (×2): qty 15
  Filled 2012-04-09 (×3): qty 1
  Filled 2012-04-09: qty 15
  Filled 2012-04-09 (×4): qty 1

## 2012-04-09 MED ORDER — RISPERIDONE 0.5 MG PO TABS
0.5000 mg | ORAL_TABLET | Freq: Every day | ORAL | Status: DC
  Administered 2012-04-09 – 2012-04-10 (×2): 0.5 mg via ORAL
  Filled 2012-04-09 (×5): qty 1

## 2012-04-09 MED ORDER — RISPERIDONE 0.25 MG PO TABS: 0.2500 mg | ORAL_TABLET | Freq: Two times a day (BID) | ORAL | Status: DC

## 2012-04-09 NOTE — BHH Suicide Risk Assessment (Signed)
Suicide Risk Assessment  Admission Assessment     Nursing information obtained from:  Patient Demographic factors:  Male;Adolescent or young adult;Caucasian;Gay, lesbian, or bisexual orientation Current Mental Status:  Suicidal ideation indicated by patient;Suicide plan;Self-harm behaviors Loss Factors:  Loss of significant relationship (girlfriend from old school) Historical Factors:  Prior suicide attempts;Family history of mental illness or substance abuse Risk Reduction Factors:  Sense of responsibility to family;Employed;Living with another person, especially a relative;Positive social support  CLINICAL FACTORS:   Severe Anxiety and/or Agitation Personality Disorders:   Cluster B Previous Psychiatric Diagnoses and Treatments  COGNITIVE FEATURES THAT CONTRIBUTE TO RISK:  Thought constriction (tunnel vision)    SUICIDE RISK:   Moderate:  Frequent suicidal ideation with limited intensity, and duration, some specificity in terms of plans, no associated intent, good self-control, limited dysphoria/symptomatology, some risk factors present, and identifiable protective factors, including available and accessible social support.  Reason for hospitalization: .severe depression with past history of suicide attempt Diagnosis:   Axis I: Anxiety Disorder NOS, Major Depression, Recurrent severe and Benzodiazepine use. Axis II: Deferred Axis III:  Past Medical History  Diagnosis Date  . Anxiety     Dr. Evelene Croon  . Depression   . Bipolar 2 disorder    Axis IV: other psychosocial or environmental problems Axis V: 21-30 behavior considerably influenced by delusions or hallucinations OR serious impairment in judgment, communication OR inability to function in almost all areas  ADL's:  Intact  Sleep: Fair  Appetite:  Fair  Suicidal Ideation:  Has has suicide attempt in the past, been seriously depressed and fearful that he might attempt. Homicidal Ideation:  Pt denies any thoughts, plans,  intent of homicide  AEB (as evidenced by): per pt report  Mental Status Examination/Evaluation: Objective:  Appearance: Casual  Eye Contact::  Fair  Speech:  Clear and Coherent  Volume:  Normal  Mood:  Anxious, Depressed and Irritable  Affect:  Congruent  Thought Process:  Coherent  Orientation:  Full  Thought Content:  WDL  Suicidal Thoughts:  Yes.  without intent/plan  Homicidal Thoughts:  No  Memory:  Immediate;   Fair Recent;   Fair Remote;   Fair  Judgement:  Impaired  Insight:  Lacking  Psychomotor Activity:  Normal  Concentration:  Fair  Recall:  Fair  Akathisia:  No  Handed:  Right  AIMS (if indicated):     Assets:  Communication Skills Desire for Improvement  Sleep:  Number of Hours: 5.5    Vital Signs:Blood pressure 140/90, pulse 69, temperature 97.9 F (36.6 C), temperature source Oral, resp. rate 16, height 5\' 8"  (1.727 m), weight 89.812 kg (198 lb). Current Medications: Current Facility-Administered Medications  Medication Dose Route Frequency Provider Last Rate Last Dose  . acetaminophen (TYLENOL) tablet 650 mg  650 mg Oral Q6H PRN Sanjuana Kava, NP   650 mg at 04/09/12 1721  . alum & mag hydroxide-simeth (MAALOX/MYLANTA) 200-200-20 MG/5ML suspension 30 mL  30 mL Oral Q4H PRN Sanjuana Kava, NP      . buPROPion (WELLBUTRIN XL) 24 hr tablet 450 mg  450 mg Oral Daily Sanjuana Kava, NP   450 mg at 04/09/12 0803  . hydrOXYzine (ATARAX/VISTARIL) tablet 25 mg  25 mg Oral TID PRN Sanjuana Kava, NP      . ibuprofen (ADVIL,MOTRIN) tablet 400 mg  400 mg Oral Q6H PRN Sanjuana Kava, NP      . magnesium hydroxide (MILK OF MAGNESIA) suspension 30 mL  30 mL  Oral Daily PRN Sanjuana Kava, NP      . risperiDONE (RISPERDAL) tablet 0.25 mg  0.25 mg Oral TID AC Mike Craze, MD      . risperiDONE (RISPERDAL) tablet 0.5 mg  0.5 mg Oral QHS Mike Craze, MD      . traZODone (DESYREL) tablet 50 mg  50 mg Oral QHS,MR X 1 Mickie D. Adams, PA   50 mg at 04/08/12 2142  . DISCONTD:  lurasidone (LATUDA) tablet 120 mg  120 mg Oral QHS Sanjuana Kava, NP   120 mg at 04/08/12 2141  . DISCONTD: risperiDONE (RISPERDAL) tablet 0.25 mg  0.25 mg Oral BID Mike Craze, MD        Lab Results: No results found for this or any previous visit (from the past 48 hour(s)).  Physical Findings: AIMS: Facial and Oral Movements Muscles of Facial Expression: None, normal Lips and Perioral Area: None, normal Jaw: None, normal Tongue: None, normal,Extremity Movements Upper (arms, wrists, hands, fingers): None, normal Lower (legs, knees, ankles, toes): None, normal, Trunk Movements Neck, shoulders, hips: None, normal, Overall Severity Severity of abnormal movements (highest score from questions above): None, normal Incapacitation due to abnormal movements: None, normal Patient's awareness of abnormal movements (rate only patient's report): No Awareness, Dental Status Current problems with teeth and/or dentures?: Yes (feels he has cavities) Does patient usually wear dentures?: No  CIWA:  CIWA-Ar Total: 6  COWS:  COWS Total Score: 5   Risk: Risk of harm to self is elevated by his impulsivity and depression and past history of suicide attempt  Risk of harm to others is minimal in that he has not been involved in fights or had any legal charges filed on him.  Treatment Plan Summary: Daily contact with patient to assess and evaluate symptoms and progress in treatment Medication management Mood/anxiety less than 3/10 where the scale is 1 is the best and 10 is the worst No suicidal or homicidal thoughts for at least 48 hours.  Plan: Admit, start Risperdal for empty feeling inside, Wellbutrin for focus benefit without causing depression to be bad.  Discussed the risks, benefits, and probable clinical course with and without treatment.  Pt is agreeable to the current course of treatment. We will continue on q. 15 checks the unit protocol. At this time there is no clinical indication for  one-to-one observation as patient contract for safety and presents little risk to harm themself and others.  We will increase collateral information. I encourage patient to participate in group milieu therapy. Pt will be seen in treatment team soon for further treatment and appropriate discharge planning. Please see history and physical note for more detailed information ELOS: 3 to 5 days.   Ardith Lewman 04/09/2012, 7:10 PM

## 2012-04-09 NOTE — Tx Team (Signed)
Interdisciplinary Treatment Plan Update (Adult)  Date:  04/09/2012  Time Reviewed:  10:03 AM   Progress in Treatment: Attending groups:   Yes   Participating in groups:  Yes Taking medication as prescribed:  Yes Tolerating medication:  Yes Family/Significant othe contact made:  Patient understands diagnosis:  Yes Discussing patient identified problems/goals with staff: Yes Medical problems stabilized or resolved: Yes Denies suicidal/homicidal ideation:Yes Issues/concerns per patient self-inventory:  Other:  New problem(s) identified:  Reason for Continuation of Hospitalization: Anxiety Depression Medication stabilization  Interventions implemented related to continuation of hospitalization:  Medication management, coping skills development and   Medication Management; safety checks q 15 mins  Additional comments:  Estimated length of stay: 2-4 days  Discharge Plan:   New goal(s):  Review of initial/current patient goals per problem list:    1.  Goal(s):  Eliminate SI  Met:  Yes  Target date: d/c  As evidenced by: Patient will no longer endorse SI/other thought self harm    2.  Goal (s):  Reduce depression/anxiety ( Rated at five and ten today)   Met:  No  Target date: d/c  As evidenced by:  Patient will rate symptoms at four or below    3.  Goal(s):  .stabilize on meds   Met:  No  Target date: d/c  As evidenced by: Patient will report being stable on medications - symptoms have decreased    4.  Goal(s):  Refer for outpatient follow up   Met:  No  Target date: d/c  As evidenced by: Outpatient follow up will be scheduled  Attendees: Patien 04/09/2012 10:03 AM  Family:     Physician:  Orson Aloe, MD 04/09/2012 10:03 AM   Nursing:   Berneice Heinrich, RN 04/09/2012 10:03 AM   CaseManager:  Juline Patch, LCSW 04/09/2012 10:03 AM   Counselor:  Angus Palms, LCSW 04/09/2012 10:03 AM   Other:  Reyes Ivan, LCSWA     04/09/2012  04/09/2012 Other:  Fredia Beets, MSW Intern    04/09/2012 10:04 AM

## 2012-04-09 NOTE — Progress Notes (Signed)
Group Note  Date:  04/09/2012 Time:  1:15  Group Topic/Focus:  Emotion Regulation  Participation Level:  Minimal  Participation Quality:  Attentive  Affect:  Appropriate  Cognitive:  Alert  Insight:  Good  Engagement in Group:  Limited  Additional Comments:  Alexander Harrington was mostly quiet, but seemed to pay attention throughout group and shared that walking out of the room when feeling overwhelmed has been a useful strategy for him when arguments escalate.  Annaston Upham S 04/09/2012, 3:09 PM

## 2012-04-09 NOTE — Progress Notes (Signed)
Psychoeducational Group Note  Date:  04/09/2012 Time:  1100  Group Topic/Focus:  Personal Choices and Values:   The focus of this group is to help patients assess and explore the importance of values in their lives, how their values affect their decisions, how they express their values and what opposes their expression.  Participation Level:  Active  Participation Quality:  Appropriate, Sharing and Supportive  Affect:  Appropriate  Cognitive:  Appropriate  Insight:  Good  Engagement in Group:  Good  Additional Comments:  none  Kay Shippy, Genia Plants 04/09/2012, 12:07 PM

## 2012-04-09 NOTE — H&P (Signed)
Medical/psychiatric screening examination/treatment/procedure(s) were performed by non-physician practitioner and as supervising physician I was immediately available for consultation/collaboration.  I have seen and examined this patient and agree with the major elements of this evaluation.  
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
## 2012-04-09 NOTE — Progress Notes (Signed)
D) Patient quiet and isolative upon my assessment. Patient states slept " fair", appetite is "improving" today. Patient endorses passive SI, contracts verbally for safety with RN. Patient denies HI, denies A/V hallucinations at this time.   A) Patient offered support and encouragement. Patient seen by MD to evaluate care plan and medications. Patient verbalized understanding of care plan. Patient remains safe on unit with Q15 minute checks for safety.   R) Patient verbalizes understanding of care plan.  Patient has a plan to "maintain a better diet, change my environment and outlook, and cut out stressful activities from my life" once he is discharged from [facility]. Patient engaging with peers on unit. Patient attending groups in day room and meals in dining room. Patient verbalizes no questions/concerns at this time. Will continue to monitor.

## 2012-04-09 NOTE — Progress Notes (Signed)
[facility] MD Progress Note  04/09/2012 7:03 PM  Diagnosis:   Axis I: Anxiety Disorder NOS, Major Depression, Recurrent severe and Benzodiazepine use. Axis II: Deferred Axis III:  Past Medical History  Diagnosis Date  . Anxiety     Dr. Evelene Croon  . Depression   . Bipolar 2 disorder    Axis IV: other psychosocial or environmental problems Axis V: 21-30 behavior considerably influenced by delusions or hallucinations OR serious impairment in judgment, communication OR inability to function in almost all areas  ADL's:  Intact  Sleep: Fair  Appetite:  Fair  Suicidal Ideation:  Has has suicide attempt in the past, been seriously depressed and fearful that he might attempt. Homicidal Ideation:  Pt denies any thoughts, plans, intent of homicide  AEB (as evidenced by): per pt report  Mental Status Examination/Evaluation: Objective:  Appearance: Casual  Eye Contact::  Fair  Speech:  Clear and Coherent  Volume:  Normal  Mood:  Anxious, Depressed and Irritable  Affect:  Congruent  Thought Process:  Coherent  Orientation:  Full  Thought Content:  WDL  Suicidal Thoughts:  Yes.  without intent/plan  Homicidal Thoughts:  No  Memory:  Immediate;   Fair Recent;   Fair Remote;   Fair  Judgement:  Impaired  Insight:  Lacking  Psychomotor Activity:  Normal  Concentration:  Fair  Recall:  Fair  Akathisia:  No  Handed:  Right  AIMS (if indicated):     Assets:  Communication Skills Desire for Improvement  Sleep:  Number of Hours: 5.5    Vital Signs:Blood pressure 140/90, pulse 69, temperature 97.9 F (36.6 C), temperature source Oral, resp. rate 16, height 5\' 8"  (1.727 m), weight 89.812 kg (198 lb). Current Medications: Current Facility-Administered Medications  Medication Dose Route Frequency Provider Last Rate Last Dose  . acetaminophen (TYLENOL) tablet 650 mg  650 mg Oral Q6H PRN Sanjuana Kava, NP   650 mg at 04/09/12 1721  . alum & mag hydroxide-simeth (MAALOX/MYLANTA) 200-200-20  MG/5ML suspension 30 mL  30 mL Oral Q4H PRN Sanjuana Kava, NP      . buPROPion (WELLBUTRIN XL) 24 hr tablet 450 mg  450 mg Oral Daily Sanjuana Kava, NP   450 mg at 04/09/12 0803  . hydrOXYzine (ATARAX/VISTARIL) tablet 25 mg  25 mg Oral TID PRN Sanjuana Kava, NP      . ibuprofen (ADVIL,MOTRIN) tablet 400 mg  400 mg Oral Q6H PRN Sanjuana Kava, NP      . magnesium hydroxide (MILK OF MAGNESIA) suspension 30 mL  30 mL Oral Daily PRN Sanjuana Kava, NP      . risperiDONE (RISPERDAL) tablet 0.25 mg  0.25 mg Oral TID AC Mike Craze, MD      . risperiDONE (RISPERDAL) tablet 0.5 mg  0.5 mg Oral QHS Mike Craze, MD      . traZODone (DESYREL) tablet 50 mg  50 mg Oral QHS,MR X 1 Mickie D. Adams, PA   50 mg at 04/08/12 2142  . DISCONTD: lurasidone (LATUDA) tablet 120 mg  120 mg Oral QHS Sanjuana Kava, NP   120 mg at 04/08/12 2141  . DISCONTD: risperiDONE (RISPERDAL) tablet 0.25 mg  0.25 mg Oral BID Mike Craze, MD        Lab Results: No results found for this or any previous visit (from the past 48 hour(s)).  Physical Findings: AIMS: Facial and Oral Movements Muscles of Facial Expression: None, normal Lips and Perioral  Area: None, normal Jaw: None, normal Tongue: None, normal,Extremity Movements Upper (arms, wrists, hands, fingers): None, normal Lower (legs, knees, ankles, toes): None, normal, Trunk Movements Neck, shoulders, hips: None, normal, Overall Severity Severity of abnormal movements (highest score from questions above): None, normal Incapacitation due to abnormal movements: None, normal Patient's awareness of abnormal movements (rate only patient's report): No Awareness, Dental Status Current problems with teeth and/or dentures?: Yes (feels he has cavities) Does patient usually wear dentures?: No  CIWA:  CIWA-Ar Total: 6  COWS:  COWS Total Score: 5   Treatment Plan Summary: Daily contact with patient to assess and evaluate symptoms and progress in treatment Medication  management Mood/anxiety less than 3/10 where the scale is 1 is the best and 10 is the worst No suicidal or homicidal thoughts for at least 48 hours.  Plan: Admit, start Risperdal for empty feeling inside, Wellbutrin for focus benefit without causing depression to be bad.  Discussed the risks, benefits, and probable clinical course with and without treatment.  Pt is agreeable to the current course of treatment.   Alexander Harrington 04/09/2012, 7:03 PM

## 2012-04-09 NOTE — Progress Notes (Signed)
[facility] Group Notes:  (Counselor/Nursing/MHT/Case Management/Adjunct)  04/09/2012 12:56 PM  Type of Therapy:  Psychoeducational Skills  Participation Level:  Minimal  Participation Quality:  Appropriate and Attentive  Affect:  Appropriate  Cognitive:  Alert, Appropriate and Oriented  Insight:  Good  Engagement in Group:  Good  Engagement in Therapy:  n/a  Modes of Intervention:  Activity, Education, Problem-solving, Socialization and Support  Summary of Progress/Problems: Alexander Harrington attended Psychoeducational group on labels. 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 Alexander Harrington was called out of group for a consult before group discussed what labels are, how we use them, and listed positive and negative labels the have used or been called.    [name] 04/09/2012, 12:56 PM

## 2012-04-09 NOTE — Progress Notes (Signed)
04/09/2012         Time: 1500      Group Topic/Focus: The focus of this group is on enhancing patients' problem solving skills, which involves identifying the problem, brainstorming solutions and choosing and trying a solution.  Participation Level: Active  Participation Quality: Attentive  Affect: Blunted  Cognitive: Oriented   Additional Comments: None.    Alexander Harrington 04/09/2012 3:36 PM

## 2012-04-10 MED ORDER — BUPROPION HCL ER (XL) 150 MG PO TB24: 450.0000 mg | ORAL_TABLET | Freq: Every day | ORAL | Status: DC

## 2012-04-10 MED ORDER — RISPERIDONE 0.25 MG PO TABS: ORAL_TABLET | ORAL | Status: DC

## 2012-04-10 MED ORDER — TRAZODONE HCL 50 MG PO TABS: 50.0000 mg | ORAL_TABLET | Freq: Every evening | ORAL | Status: DC | PRN

## 2012-04-10 NOTE — Progress Notes (Signed)
  D) Patient quiet but cooperative upon my assessment. Patient states slept "okay," and  appetite is " improving." Patient rates depression as   3/10, patient rates hopeless feelings as  4/10. Patient denies SI/HI, denies A/V hallucinations.   A) Patient offered support and encouragement, patient encouraged to discuss feelings/concerns with staff. Patient verbalized understanding. Patient monitored Q15 minutes for safety. Patient met with MD to discuss today's goals and plan of care.  R) Patient active on unit, attending groups in day room and meals in dining room.  Patient very insightful today with a plan to "maintain better emotional care of myself and seek help earlier rather than later. Patient taking medications as ordered. Will continue to monitor.

## 2012-04-10 NOTE — Progress Notes (Signed)
Pt observed in the dayroom watching TV and interacting with his peers.  He attended evening group.  He contracts for safety.  Denies HI/AV.  He still rates his depression/hopelessness high.  He looks sad with flat affect.  Encouraged pt to make his needs known to staff.  Pt voiced understanding.  Pt is unsure of his discharge plan, but does plan to return to his parents home.  Safety maintained with q15 minute checks.

## 2012-04-10 NOTE — Progress Notes (Signed)
[facility] MD Progress Note  04/10/2012 12:50 PM  S: "I'm doing better today, just tired. I slept a lot last night. My mood is better today. No suicidal thoughts".  Diagnosis:   Axis I: Major depressive disorder, recurrent episode, severe, without mention of psychotic behavior Axis II: Cluster B Traits Axis III:  Past Medical History  Diagnosis Date  . Anxiety     Dr. Evelene Croon  . Depression   . Bipolar 2 disorder    Axis IV: educational problems and other psychosocial or environmental problems Axis V: 41-50 serious symptoms  ADL's:  Intact  Sleep: Good  Appetite:  Good  Suicidal Ideation: "No" Plan:  No Intent:  No Means:  No Homicidal Ideation:  Plan:  No Intent:  No Means:  No  AEB (as evidenced by): Per patient's reports.  Mental Status Examination/Evaluation: Objective:  Appearance: Casual  Eye Contact::  Good  Speech:  Clear and Coherent  Volume:  Normal  Mood:  Depressed, but I feel a little better today.  Affect:  Flat  Thought Process:  Coherent  Orientation:  Full  Thought Content:  Obsessions and Rumination  Suicidal Thoughts:  No  Homicidal Thoughts:  No  Memory:  Immediate;   Good Recent;   Good Remote;   Good  Judgement:  Fair  Insight:  Fair  Psychomotor Activity:  Normal  Concentration:  Good  Recall:  Good  Akathisia:  No  Handed:  Right  AIMS (if indicated):     Assets:  Desire for Improvement  Sleep:  Number of Hours: 6.75    Vital Signs:Blood pressure 140/90, pulse 69, temperature 97.9 F (36.6 C), temperature source Oral, resp. rate 16, height 5\' 8"  (1.727 m), weight 89.812 kg (198 lb). Current Medications: Current Facility-Administered Medications  Medication Dose Route Frequency Provider Last Rate Last Dose  . acetaminophen (TYLENOL) tablet 650 mg  650 mg Oral Q6H PRN Sanjuana Kava, NP   650 mg at 04/09/12 1721  . alum & mag hydroxide-simeth (MAALOX/MYLANTA) 200-200-20 MG/5ML suspension 30 mL  30 mL Oral Q4H PRN Sanjuana Kava, NP        . buPROPion (WELLBUTRIN XL) 24 hr tablet 450 mg  450 mg Oral Daily Sanjuana Kava, NP   450 mg at 04/10/12 0803  . hydrOXYzine (ATARAX/VISTARIL) tablet 25 mg  25 mg Oral TID PRN Sanjuana Kava, NP      . ibuprofen (ADVIL,MOTRIN) tablet 400 mg  400 mg Oral Q6H PRN Sanjuana Kava, NP      . magnesium hydroxide (MILK OF MAGNESIA) suspension 30 mL  30 mL Oral Daily PRN Sanjuana Kava, NP      . risperiDONE (RISPERDAL) tablet 0.25 mg  0.25 mg Oral TID AC Mike Craze, MD   0.25 mg at 04/10/12 1148  . risperiDONE (RISPERDAL) tablet 0.5 mg  0.5 mg Oral QHS Mike Craze, MD   0.5 mg at 04/09/12 2139  . traZODone (DESYREL) tablet 50 mg  50 mg Oral QHS,MR X 1 Mickie D. Adams, PA   50 mg at 04/09/12 2341  . DISCONTD: lurasidone (LATUDA) tablet 120 mg  120 mg Oral QHS Sanjuana Kava, NP   120 mg at 04/08/12 2141  . DISCONTD: risperiDONE (RISPERDAL) tablet 0.25 mg  0.25 mg Oral BID Mike Craze, MD        Lab Results: No results found for this or any previous visit (from the past 48 hour(s)).  Physical Findings: AIMS: Facial and  Oral Movements Muscles of Facial Expression: None, normal Lips and Perioral Area: None, normal Jaw: None, normal Tongue: None, normal,Extremity Movements Upper (arms, wrists, hands, fingers): None, normal Lower (legs, knees, ankles, toes): None, normal, Trunk Movements Neck, shoulders, hips: None, normal, Overall Severity Severity of abnormal movements (highest score from questions above): None, normal Incapacitation due to abnormal movements: None, normal Patient's awareness of abnormal movements (rate only patient's report): No Awareness, Dental Status Current problems with teeth and/or dentures?: Yes (feels he has cavities) Does patient usually wear dentures?: No  CIWA:  CIWA-Ar Total: 6  COWS:  COWS Total Score: 5   Treatment Plan Summary: Daily contact with patient to assess and evaluate symptoms and progress in treatment Medication management  Plan: Continue  current treatment plan.  Armandina Stammer I 04/10/2012, 12:50 PM

## 2012-04-10 NOTE — Progress Notes (Signed)
D: Patient calm and cooperative.  Patient in dayroom on approach.  Patient watching peers play card game.  Patient initiated conversation with Clinical research associate.  Patient states he has been tired and sluggish today.  Patient states it could be from his medications.  Patient states she has learned that he is not the only one going through something.  Patient states he enjoys groups and although he does not share much he states he is listening to others stories.  Patient states he does not feel alone. A: Staff to monitor Q 15 mins for safety.  Encouragement and support offered.  Scheduled mediations administered per orders R: Patient remains safe on the unit.  Patient attended karaoke group tonight.  Patient taking administered medications.  Patient remains calm and cooperative and interacting in milieu.

## 2012-04-10 NOTE — Progress Notes (Signed)
  [facility] Group Notes: (Counselor/Nursing/MHT/Case Management/Adjunct)  04/10/2012 @1 :15pm  Advocacy and Community in Recovery  Type of Therapy: Group Therapy  Participation Level: Minimal Participation Quality: Appropriate  Affect: Appropriate  Cognitive: Appropriate  Insight: None Engagement in Group: Minimal  Engagement in Therapy: None  Modes of Intervention: Support and Exploration  Summary of Progress/Problems: Alexander Harrington participated in discussion of recovery stories. He was attentive but did not engage verbally.  [name]  04/10/2012 1:03 PM

## 2012-04-10 NOTE — Progress Notes (Signed)
I agree with this note.  

## 2012-04-11 DIAGNOSIS — F1994 Other psychoactive substance use, unspecified with psychoactive substance-induced mood disorder: Secondary | ICD-10-CM | POA: Diagnosis present

## 2012-04-11 MED ORDER — BUPROPION HCL ER (XL) 150 MG PO TB24
450.0000 mg | ORAL_TABLET | Freq: Every day | ORAL | Status: DC
  Filled 2012-04-11 (×2): qty 9

## 2012-04-11 NOTE — Progress Notes (Signed)
Psychoeducational Group Note  Date:  04/11/2012 Time: 1030  Group Topic/Focus:  Therapuetic activity   Participation Level:  Activity   Participation Quality:  Appropriate   Affect:  Appropriate  Cognitive:  Appropriate  Insight:  Good  Engagement in Group:  Good  Additional Comments:  Pt. Participated in a therapeutic activity in which patients engage with each other and learn about their commonalities.   Ruta Hinds [facility] 04/11/2012, 1:36 PM

## 2012-04-11 NOTE — Discharge Summary (Signed)
HomePhysician Discharge Summary Note  Patient:  Alexander Harrington is an 21 y.o., male MRN:  [id number] DOB:  July 31, 1990 Patient phone:  [phone] (home)  Patient address:   [address] [place] [address]   Date of Admission:  04/08/2012 Date of Discharge: 04/11/2012  Discharge Diagnoses: Principal Problem:  *Major depressive disorder, recurrent episode, severe, without mention of psychotic behavior  Axis Diagnosis:  Axis I: Major depressive disorder, recurrent episode, severe, without mention of psychotic behavior  Axis II: Cluster B Traits  Axis III:  Past Medical History   Diagnosis  Date   .  Anxiety      Dr. Evelene Croon   .  Depression    .  Bipolar 2 disorder    Axis IV: educational problems and other psychosocial or environmental problems  Axis V: 61-70 mild symptoms   Level of Care:  OP  Hospital Course:   This is a 21 year old Caucasian male, admitted to [facility] from the [facility] ED with complaints of suicidal ideations with plans to shoot self. Patient reports, "My dad took me to [facility] last Monday morning per recommendation of my school psychiatrist. The school psychiatrist thought that I was going downwards. I have been feeling very depressed, a lot of crying spells, unable to function at school and self isolation. I have been having a very difficult time sleeping at night. I have problems completing home work, unable to attend classes because I was feeling down and out. I feel very fatigued emotionally and physically tired to complete any task. I have a lot of anxiety and poor focus. I have been depressed x 2 years. It has worsened this past 2 weeks due to stress and social problems. I am stressing myself out because I know I am not doing well at school. My grades dropped from a B average to barely a C. GPA average. I know that I was not doing well in what I should be doing at school in the first place. I transferred from the [employer] to [employer]  because the program that I was doing at the [employer] was not going well. I had to transfer to this school that I don't like. I also, left my friends at my old school, one friend in particular, a girl. We were very good friends, but I think that I am falling in love with her. I can't get to tell her how I feel about her because she stopped talking to me. She has visited me once here at [facility] since I transferred out of the [employer]. I can't get her off my mind. I am constantly thinking about what she is doing and whom, I keep thinking, who is she with now? Since starting at [employer], I have been living with my parents to curtail some college cost. I find myself feeling detatched from my parents. I believe living at home with my parents is making my depression worse. I don't like [employer]. My memory is slipping away from me, it feels hazy and lapses on me. I have attempted suicide by overdose in the past".   While a patient in this hospital, [name] received medication management for headache, depression, and insomnia. They were ordered and received Motrin, Wellbutrin, Risperdal, and Trazodone for these conditions. They were also enrolled in group counseling sessions and activities in which they participated actively.   Patient attended treatment team meeting this am and met with  treatment team members. Pt symptoms, treatment plan and response to treatment discussed. [name] endorsed that their symptoms have improved. Pt also stated that they are stable for discharge.  In other to maintain control of mood, pain problems, and sleep wake cycle, they will continue psychiatric care on outpatient basis. They will follow-up at [facility] on 9/25 with Tomasa Rand at 1000 as well as with Dr Westley Chandler on 9/21 at 1345.  In addition they were instructed to take all your medications as prescribed by your mental healthcare provider, to report any adverse effects and or reactions from your medicines  to your outpatient provider promptly, patient is instructed and cautioned to not engage in alcohol and or illegal drug use while on prescription medicines, in the event of worsening symptoms, patient is instructed to call the crisis hotline, 911 and or go to the nearest ED for appropriate evaluation and treatment of symptoms.   Upon discharge, patient adamantly denies suicidal, homicidal ideations, auditory, visual hallucinations and or delusional thinking. They left [facility] with all personal belongings via personal transportation in no apparent distress.  Consults:  None  Significant Diagnostic Studies:  Labs: UDA positive for amphetamines, Lithium level 0.35  CMET, CBC, TSH, BAL, UA non contributory  Discharge Vitals:   Blood pressure 118/77, pulse 98, temperature 97.7 F (36.5 C), temperature source Oral, resp. rate 14, height 5\' 8"  (1.727 m), weight 89.812 kg (198 lb)..  Mental Status Exam: See Mental Status Examination and Suicide Risk Assessment completed by Attending Physician prior to discharge.  Discharge destination:  Home  Is patient on multiple antipsychotic therapies at discharge:  No  Has Patient had three or more failed trials of antipsychotic monotherapy by history: N/A Recommended Plan for Multiple Antipsychotic Therapies: N/A    Medication List     As of 04/11/2012  3:43 PM    STOP taking these medications         KLONOPIN 0.5 MG tablet   Generic drug: clonazePAM      LATUDA 120 MG Tabs   Generic drug: Lurasidone HCl      lithium carbonate 300 MG capsule      TAKE these medications      Indication    ADVIL 200 MG tablet   Generic drug: ibuprofen   Take 400 mg by mouth every 6 (six) hours as needed. Headache, pain       buPROPion 150 MG 24 hr tablet   Commonly known as: WELLBUTRIN XL   Take 3 tablets (450 mg total) by mouth daily. For depression.       risperiDONE 0.25 MG tablet   Commonly known as: RISPERDAL   Take by mouth 1 three times a day for  anxiety, and 2 at bedtime for insomnia       traZODone 50 MG tablet   Commonly known as: DESYREL   Take 1 tablet (50 mg total) by mouth at bedtime and may repeat dose one time if needed. For insomnia.            Follow-up Information    Follow up with [facility] On 04/16/2012. (Patient has an appointment with Tomasa Rand at the [facility] on Wednesday, April 16, 2012 at 10 am.)    Contact information:   [address] [place], [place] [address]  [phone]      Follow up with Dr. Lafayette Dragon. On 04/12/2012. (Patient has an appointment with Dr.Carr on Saturday, Septmeber 21, 2013 at 1:45)    Contact  information:   [address] [place], [place] [address]  [phone]        Follow-up recommendations:   Activities: Resume typical activities Diet: Resume typical diet Tests: none Other: Follow up with outpatient provider and report any side effects to out patient prescriber.  Comments:  Take all your medications as prescribed by your mental healthcare provider. Report any adverse effects and or reactions from your medicines to your outpatient provider promptly. Patient is instructed and cautioned to not engage in alcohol and or illegal drug use while on prescription medicines. In the event of worsening symptoms, patient is instructed to call the crisis hotline, 911 and or go to the nearest ED for appropriate evaluation and treatment of symptoms.  SignedOrson Aloe 04/11/2012 3:43 PM

## 2012-04-11 NOTE — Progress Notes (Signed)
Pt discharged per MD orders; pt currently denies SI/HI and auditory/visual hallucinations; pt was given education by RN regarding follow-up appointments and medications and pt denied any questions or concerns about these instructions; pt was then escorted to search room to retrieve his belongings by RN before being discharged to hospital lobby. 

## 2012-04-11 NOTE — Progress Notes (Signed)
[facility] Family/Significant Other Suicide Prevention Education  Suicide Prevention Education: Discussed the warning signs of depression and suicidality (i.e., threatening or talking about hurting or killing himself, looking for means, talking about death or dying; feeling hopeless, giving away personal belongings, rage, recklessness, feeling trapped, substance abuse, withdrawal, mood swings, purposelessness). Mr. Bercier was encouraged to continue to listen, be nonjudgmental, ask direct questions about the Pt.'s intentions, stay with him if intention to commit suicide is observed, and remove any access to means. Mr. Wilcher was advised to call 911, [facility], or 1-800-suicide if concerned about risk.  Education Completed; Surveyor, quantity (f), has been identified by the patient as the family member/significant other with whom the patient will be residing, and identified as the person(s) who will aid the patient in the event of a mental health crisis (suicidal ideations/suicide attempt).  With written consent from the patient, the family member/significant other has been provided the following suicide prevention education, prior to the and/or following the discharge of the patient.  The suicide prevention education provided includes the following:  Suicide risk factors  Suicide prevention and interventions  National Suicide Hotline telephone number  [facility] assessment telephone number  [facility] Emergency Assistance 911  [facility] and/or Residential Mobile Crisis Unit telephone number  Request made of family/significant other to:  Remove weapons (e.g., guns, rifles, knives), all items previously/currently identified as safety concern.    Remove drugs/medications (over-the-counter, prescriptions, illicit drugs), all items previously/currently identified as a safety concern.  The family member/significant other verbalizes understanding of the suicide prevention  education information provided.  The family member/significant other agrees to remove the items of safety concern listed above.  Jone Baseman 04/11/2012, 12:02 PM

## 2012-04-11 NOTE — Progress Notes (Signed)
[facility] Case Management Discharge Plan:  Will you be returning to the same living situation after discharge: Yes,  Patient will return to parents home. At discharge, do you have transportation home?:Yes,  Patients family will transport. Do you have the ability to pay for your medications:Yes,  Patient has insurance.  Interagency Information:     Release of information consent forms completed and in the chart;  Patient's signature needed at discharge.  Patient to Follow up at:  Follow-up Information    Follow up with [facility] On 04/16/2012. (Patient has an appointment with Tomasa Rand at the [facility] on Wednesday, April 16, 2012 at 10 am.)    Contact information:   [address] [place], [place] [address]  [phone]      Follow up with Dr. Lafayette Dragon. On 04/12/2012. (Patient has an appointment with Dr.Carr on Saturday, Septmeber 21, 2013 at 1:45)    Contact information:   [address] [place], [place] [address]  [phone]         Patient denies SI/HI:   Yes,  Patient is no longer endorsing SI or other thoughts of self-harm.    Safety Planning and Suicide Prevention discussed:  Yes,  Safety Planning and Suicide Prevention discussed during aftercare planning group.  Barrier to discharge identified:No.  Summary and Recommendations:   Rudransh Bellanca L 04/11/2012, 10:04 AM

## 2012-04-11 NOTE — [facility] Suicide Risk Assessment (Signed)
Suicide Risk Assessment  Discharge Assessment     Current Mental Status by Physician: Patient denies suicidal or homicidal ideation, hallucinations, illusions, or delusions. Patient engages with good eye contact, is able to focus adequately in a one to one setting, and has clear goal directed thoughts. Patient speaks with a natural conversational volume, rate, and tone. Anxiety was reported at 3 on a scale of 1 the least and 10 the most. Depression was reported at 3 on the same scale. Patient is oriented times 4, recent and remote memory intact. Judgement: Improved from admission Insight: improved from admission  Demographic factors:  Male;Adolescent or young adult;Caucasian;Gay, lesbian, or bisexual orientation Loss Factors:  Loss of significant relationship (girlfriend from old school) Historical Factors:  Prior suicide attempts;Family history of mental illness or substance abuse Risk Reduction Factors:  Sense of responsibility to family;Employed;Living with another person, especially a relative;Positive social support  Continued Clinical Symptoms:  Depression:   Insomnia Personality Disorders:   Cluster B Previous Psychiatric Diagnoses and Treatments  Discharge Diagnoses: Axis I: Major depressive disorder, recurrent episode, severe, without mention of psychotic behavior  Axis II: Cluster B Traits  Axis III:  Past Medical History   Diagnosis  Date   .  Anxiety      Dr. Evelene Croon   .  Depression    .  Bipolar 2 disorder     Axis IV: educational problems and other psychosocial or environmental problems  Axis V: 61-70 mild symptoms   Cognitive Features That Contribute To Risk:  Thought constriction (tunnel vision)    Suicide Risk:  Minimal: No identifiable suicidal ideation.  Patients presenting with no risk factors but with morbid ruminations; may be classified as minimal risk based on the severity of the depressive symptoms  Labs:  No results found for this or any previous  visit (from the past 72 hour(s)). RISK REDUCTION FACTORS: What pt has learned from hospital stay is that they are not alone and there are things to watch for to prevent suicide.  Risk of self harm is elevated by his depression and impulsivity and prior attempt, but he has decided that he has his family and himself to live for.  Risk of harm to others is minimal in that he has not been involved in fights or had any legal charges filed on him.  Pt seen in treatment team where he divulged the above information. The treatment team concluded that he was ready for discharge and had met his goals for an inpatient setting.  PLAN: Discharge home Continue   Medication List     As of 04/11/2012  3:34 PM    STOP taking these medications         KLONOPIN 0.5 MG tablet   Generic drug: clonazePAM      LATUDA 120 MG Tabs   Generic drug: Lurasidone HCl      lithium carbonate 300 MG capsule      TAKE these medications      Indication    ADVIL 200 MG tablet   Generic drug: ibuprofen   Take 400 mg by mouth every 6 (six) hours as needed. Headache, pain       buPROPion 150 MG 24 hr tablet   Commonly known as: WELLBUTRIN XL   Take 3 tablets (450 mg total) by mouth daily. For depression.       risperiDONE 0.25 MG tablet   Commonly known as: RISPERDAL   Take by mouth 1 three times a day for anxiety,  and 2 at bedtime for insomnia       traZODone 50 MG tablet   Commonly known as: DESYREL   Take 1 tablet (50 mg total) by mouth at bedtime and may repeat dose one time if needed. For insomnia.        Follow-up recommendations:  Activities: Resume typical activities Diet: Resume typical diet Tests: none Other: Follow up with outpatient provider and report any side effects to out patient prescriber.  Dan Humphreys, Sally-Ann Cutbirth 04/11/2012 3:34 PM

## 2012-04-11 NOTE — Progress Notes (Signed)
[facility] Group Notes:  (Counselor/Nursing/MHT/Case Management/Adjunct)  04/11/2012 3:18 PM  Type of Therapy:  Psychoeducational Skills  Participation Level:  Minimal  Participation Quality:  Sharing  Affect:  Appropriate  Cognitive:  Appropriate  Insight:  Good  Engagement in Group:  Good  Engagement in Therapy:  Good  Modes of Intervention:  Problem-solving  Summary of Progress/Problems: Pt. Was attentive during group focused on relapse prevention and recognizing the behaviors that may precede relapse. Pt. Shared that he isolates more prior to episodes of depression. Jonna Clark, LPC   Jone Baseman 04/11/2012, 3:18 PM

## 2012-04-11 NOTE — Progress Notes (Addendum)
Interdisciplinary Treatment Plan Update (Adult)  Date:  04/11/2012  Time Reviewed:  9:46 AM   Progress in Treatment: Attending groups:   Yes   Participating in groups:  Yes Taking medication as prescribed:  Yes Tolerating medication:  Yes Family/Significant othe contact made: Contact to be made by counselor with patient's family prior to discharge Patient understands diagnosis:  Yes Discussing patient identified problems/goals with staff: Yes Medical problems stabilized or resolved: Yes Denies suicidal/homicidal ideation:Yes Issues/concerns per patient self-inventory:  Other:  New problem(s) identified:  Reason for Continuation of Hospitalization:  Interventions implemented related to continuation of hospitalization:  Additional comments:  Estimated length of stay: Discharge home today  Discharge Plan:  Home with family and outpatient follow up  New goal(s):  Review of initial/current patient goals per problem list:    1.  Goal(s): Eliminate SI/other thoughts of self harm   Met:  Yes  Target date: d/c  As evidenced by: Patient will no endorses SI/HI or other thoughts of self harm  2.  Goal (s):  Reduce depression/anxiety   Met:  Yes  Target date: d/c  As evidenced by: Patient currently rating symptoms at four or below    3.  Goal(s): .stabilize on meds   Met:  Yes  Target date: d/c  As evidenced by:  Patient reports being stabilized on medications - less symptomatic    4.  Goal(s):  Refer for outpatient follow up (rates both at three/four today)   Met:  Yes  Target date: d/c  As evidenced by: Follow up appointment scheduled    Attendees: Patient:  Alexander Harrington 04/11/2012 9:55 AM  Nursing:  Nestor Ramp, RN 04/11/2012 9:56 AM  Physician:  Orson Aloe, MD 04/11/2012 9:46 AM   Nursing:   Charlyne Mom, RN 04/11/2012 9:46 AM   CaseManager:  Juline Patch, LCSW 04/11/2012 9:46 AM   Counselor:  Boneta Lucks, [facility] 04/11/2012 9:46 AM   Other:   Reyes Ivan, LCSWA     .

## 2012-04-14 NOTE — Progress Notes (Signed)
Patient Discharge Instructions:  After Visit Summary (AVS):   Faxed to:  04/14/2012 Psychiatric Admission Assessment Note:   Faxed to:  04/14/2012 Suicide Risk Assessment - Discharge Assessment:   Faxed to:  04/14/2012 Faxed/Sent to the Next Level Care provider:  04/14/2012  Faxed to [facility] richard @ [phone]  [name], 04/14/2012, 12:47 PM

## 2013-04-07 ENCOUNTER — Other Ambulatory Visit (HOSPITAL_COMMUNITY): Payer: BC Managed Care – PPO | Attending: Psychiatry | Admitting: Psychiatry

## 2013-04-07 ENCOUNTER — Encounter (HOSPITAL_COMMUNITY): Payer: Self-pay

## 2013-04-07 DIAGNOSIS — F329 Major depressive disorder, single episode, unspecified: Secondary | ICD-10-CM

## 2013-04-07 DIAGNOSIS — F3162 Bipolar disorder, current episode mixed, moderate: Secondary | ICD-10-CM

## 2013-04-07 DIAGNOSIS — F411 Generalized anxiety disorder: Secondary | ICD-10-CM | POA: Insufficient documentation

## 2013-04-07 DIAGNOSIS — F909 Attention-deficit hyperactivity disorder, unspecified type: Secondary | ICD-10-CM | POA: Insufficient documentation

## 2013-04-07 DIAGNOSIS — F419 Anxiety disorder, unspecified: Secondary | ICD-10-CM

## 2013-04-07 NOTE — Progress Notes (Signed)
Patient ID: Alexander Harrington, male   DOB: [birth year], 22 y.o.   MRN: [id number] D:  This is a 22 year old, single, caucasian male, admitted due to worsening depressive and anxiety symptoms with SI (plan: OD).  Discussed safety options with pt.  Pt is able to contract for safety.  Previous suicide attempt (overdose on 11-17-10).  Also, a hx of self injurious harm (superficial cuts on upper arms, hands and legs.)  States recent self mutilation was ~ one year ago, when he was admitted at [facility].  Denies HI or A/V hallucinations.  Reports symptoms worsening a couple of weeks ago.t self. Patient reports feeling very depressed, a lot of crying spells, unable to function at school and self isolation. "I have been having a very difficult time sleeping at night. I have problems completing home work, unable to attend classes because I was feeling down and out. I feel very fatigued emotionally and physically tired to complete any task. I have a lot of anxiety and unable to focus. I have been depressed x 2 years."  Trigger/Stressors:  1) School ([employer]:  Senior Year):  Pt moved into the dorm August 2014.  " I transferred from the [employer] to [employer] (Fall 2012) because the program that I was doing at the [employer] was not going well. I had to transfer to this school that I don't like. I also, left my friends at my old school, one friend in particular, a girl. We were very good friends, but I think that I was falling in love with her. I can't get to tell her how I feel about her because she stopped talking to me. She has visited me once here at [facility] since I transferred out of the [employer] of the [employer]. I can't get her off my mind. I am constantly thinking about what she is doing and whom, I keep thinking, who is she with now? Since starting at [employer], I have been living with my parents to curtail some college cost. I find myself feeling detatched from my parents. I believe living at home with my parents is making my depression  worse. I don't like [employer]. "  2)  Relationship Issues:  "I have a fear of being alone."  Reports meeting a male at a conference in [place] this summer.  Apparently, the male hasn't made contact with pt.  Pt reports having conflict with his parents due to school.  "We argue a lot about school.  There's a lot of miscommunication."  Reports that mother suffers with severe depression and currently gets ECT at [facility]. Childhood:  "Really good."  States he was a happy kid until age 22.  Reports that's when all the bullying started.  "I never fit in with the other kids.  I wasn't popular."  States he did very well in school.  Pt was in the accelerated/AP classes.  Received his first "B" in the 8th grade.  States due to the bullying he started getting "C's" in the 10th grade. Maternal Grandparents had substance abuse issues. Sibling:  Pt is an only child. Pt reports his support system includes his father, cousins and close friends.  Pt is employed by [employer] and [employer].   Drugs/ETOH:  Admits to drinking ETOH (beer) two-six beers 2-3 times per week.  Admits to drinking a glass of wine with dinner last night and prior to that having five or six beers on 04-03-13. Pt did admit to  THC use.  States he smokes a few hits occasionally.  Most recently smoked this past weekend.  Denies any other drugs. Pt completed all forms.  Scored 30 on the burns.  Pt will attend [facility] for two weeks.  A:  Oriented pt.  Informed Dr. Evelene Croon and Jerolyn Shin, [facility] of admit.  Provided pt with an orientation folder.  Will refer pt to [facility], LCSW.  Encouraged support groups.  R:  Pt receptive.

## 2013-04-08 ENCOUNTER — Other Ambulatory Visit (HOSPITAL_COMMUNITY): Payer: BC Managed Care – PPO | Admitting: Psychiatry

## 2013-04-08 ENCOUNTER — Encounter (HOSPITAL_COMMUNITY): Payer: Self-pay

## 2013-04-08 DIAGNOSIS — F329 Major depressive disorder, single episode, unspecified: Secondary | ICD-10-CM

## 2013-04-08 DIAGNOSIS — F419 Anxiety disorder, unspecified: Secondary | ICD-10-CM

## 2013-04-08 DIAGNOSIS — F332 Major depressive disorder, recurrent severe without psychotic features: Secondary | ICD-10-CM

## 2013-04-08 NOTE — Progress Notes (Signed)
    Daily Group Progress Note  Program: IOP  Group Time: 9:00-10:30 am   Participation Level: Active  Behavioral Response: Appropriate  Type of Therapy:  Process Group  Summary of Progress: Today was Pts first day in the group after being referred for the treatment of depression and anxiety symptoms. Pt was introduced, was attentive and observed the group process.      Group Time: 10:30 am - 12:00 pm   Participation Level:  Active  Behavioral Response: Appropriate  Type of Therapy: Psycho-education Group  Summary of Progress: Pt learned about the symptoms of depression and how to identify them to reduce symptoms and identify increases in symptoms.   Carman Ching, LCSW

## 2013-04-08 NOTE — Progress Notes (Signed)
Psychiatric Assessment Adult  Patient Identification:  Alexander Harrington Date of Evaluation:  04/08/2013 Chief Complaint: Depression History of Chief Complaint:   Chief Complaint  Patient presents with  . Depression  . 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 who is referred by Jerolyn Shin, and advocate at [facility] for intensive outpatient treatment for his increased depression and anxiety. He reports that his anxiety and depression have increased over the past week or so. His depression is severely affecting his ability to function in his educational endeavors. He expresses an inability to sleep at night, then he sleeps during the day and misses classes. He is experiencing frequent crying spells, feeling sad and worthless as well as hopeless, poor energy and a lack of motivation, and a desire to isolate. He also reports that he worries excessively, experiences panic attacks, and has social anxiety. He was bullied when he was in middle school and high school, and he has intrusive thoughts, and a tendency to ruminate. He also endorses an inability to maintain focus and concentrate, being easily distracted, losing items, procrastinating, and having trouble finishing projects. He denies any periods with decreased need for sleep, but does endorse a period of increased mood and energy that are situational, as well as some impulsive spending behaviors. He currently denies any suicidal thoughts, but reports that he had some passive suicidal ideation 2 days ago. He denies that he had any plan or intention on harming himself. He denies experiencing homicidal ideation or auditory or visual hallucinations.  Alexander Harrington was hospitalized one year ago at [facility] on advice by his school psychiatrist because he expresses having some suicidal thoughts to shoot himself. He was discharged with the diagnosis of major depressive disorder, as well as cluster B. personality disorder  traits. He has been seeing Dr. Milagros Evener for several years, and reports that he has been diagnosed with bipolar disorder and ADHD in the past. He currently does not have an outpatient therapist, but expresses an interest in obtaining one.  Review of Systems  Constitutional: Negative.   HENT: Negative.   Eyes: Negative.   Respiratory: Negative.   Cardiovascular: Negative.   Gastrointestinal: Negative.   Endocrine: Negative.   Genitourinary: Negative.   Musculoskeletal: Negative.   Skin: Negative.   Allergic/Immunologic: Negative.   Neurological: Negative.   Hematological: Negative.   Psychiatric/Behavioral: Positive for suicidal ideas, sleep disturbance, dysphoric mood and decreased concentration. The patient is nervous/anxious.    Physical Exam  Constitutional: He is oriented to person, place, and time. He appears well-developed and well-nourished.  HENT:  Head: Normocephalic and atraumatic.  Eyes: Pupils are equal, round, and reactive to light.  Neck: Normal range of motion.  Musculoskeletal: Normal range of motion.  Neurological: He is alert and oriented to person, place, and time.    Depressive Symptoms: depressed mood, anhedonia, insomnia, feelings of worthlessness/guilt, difficulty concentrating, hopelessness, suicidal thoughts without plan, anxiety, panic attacks, loss of energy/fatigue,  (Hypo) Manic Symptoms:   Elevated Mood:  Yes Irritable Mood:  No Grandiosity:  No Distractibility:  Yes Labiality of Mood:  Yes Delusions:  No Hallucinations:  No Impulsivity:  Yes Sexually Inappropriate Behavior:  No Financial Extravagance:  Yes Flight of Ideas:  No  Anxiety Symptoms: Excessive Worry:  Yes Panic Symptoms:  Yes Agoraphobia:  No Obsessive Compulsive: No  Symptoms: None, Specific Phobias:  No Social Anxiety:  Yes  Psychotic Symptoms:  Hallucinations: No None Delusions:  No Paranoia:  No   Ideas of Reference:  No  PTSD Symptoms: Ever had a  traumatic exposure:  Yes, bullied in school Had a traumatic exposure in the last month:  No Re-experiencing: Yes Intrusive Thoughts Hypervigilance:  Yes Hyperarousal: Yes Difficulty Concentrating Sleep Avoidance: Yes Decreased Interest/Participation  Traumatic Brain Injury: No   Past Psychiatric History: Diagnosis: Maj. depressive disorder, bipolar disorder, ADHD   Hospitalizations: [facility] September 2013   Outpatient Care: Dr. Milagros Evener, previously Dr. Sharyon Medicus   Substance Abuse Care: None   Self-Mutilation: History of cutting   Suicidal Attempts: Denies   Violent Behaviors: Denies    Past Medical History:   Past Medical History  Diagnosis Date  . Anxiety     Dr. Evelene Croon  . Depression   . Bipolar 2 disorder    History of Loss of Consciousness:  No Seizure History:  No Cardiac History:  No Allergies:   Allergies  Allergen Reactions  . Amphetamines Other (See Comments)    Caused depression that went away after stopping the amphetamine  . Benzodiazepines Other (See Comments)    Got cloudy thinking, loss of memory, suicidal thinking on Klonopin.  . Codeine Rash   Current Medications:  Current Outpatient Prescriptions  Medication Sig Dispense Refill  . ARIPiprazole (ABILIFY) 20 MG tablet Take 20 mg by mouth daily.      Marland Kitchen buPROPion (WELLBUTRIN XL) 150 MG 24 hr tablet Take 3 tablets (450 mg total) by mouth daily. For depression.  30 tablet  0  . clonazePAM (KLONOPIN) 2 MG tablet Take 2 mg by mouth 2 (two) times daily.      Marland Kitchen Desvenlafaxine ER (KHEDEZLA) 50 MG TB24 Take 50 mg by mouth daily.      Marland Kitchen ibuprofen (ADVIL) 200 MG tablet Take 400 mg by mouth every 6 (six) hours as needed. Headache, pain       No current facility-administered medications for this visit.    Previous Psychotropic Medications:  Medication Dose   Abilify   20 mg daily   Wellbutrin 450 mg daily   Klonopin   2 mg twice daily   Khedezla   50 mg daily            Substance Abuse History in the  last 12 months: Substance Age of 1st Use Last Use Amount Specific Type  Nicotine      Alcohol      Cannabis    one week ago      Medical Consequences of Substance Abuse:  none  Legal Consequences of Substance Abuse:  none   Family Consequences of Substance Abuse:  none   Blackouts:  No DT's:  No Withdrawal Symptoms:  No None  Social History: Alexander Harrington was born in [place], [place], and grew up in [place], [place]. 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 He lives off campus with roommates. He denies any legal problems. He reports that he is spiritual but acting not stick. His hobbies include watching movies, reading, and writing. His social support system consists of his father, mother, friends, and cousins.   Family History:   Family History  Problem Relation Age of Onset  . Arthritis Mother   . Mental illness Mother   . Depression Mother   . Drug abuse Maternal Grandfather   . Alcohol abuse Maternal Grandfather   . Drug abuse Maternal Grandmother   .  Alcohol abuse Maternal Grandmother     Mental Status Examination/Evaluation: Objective:  Appearance: Casual  Eye Contact::  Good  Speech:  Clear and Coherent  Volume:  Normal  Mood:   dysphoric and mildly anxious   Affect:  Blunt  Thought Process:  Linear  Orientation:  Full (Time, Place, and Person)  Thought Content:  WDL  Suicidal Thoughts:  No  Homicidal Thoughts:  No  Judgement:  Fair  Insight:  Lacking  Psychomotor Activity:  Normal  Akathisia:  No  Handed:    AIMS (if indicated):    Assets:  Communication Skills Desire for Improvement Housing Physical Health Social Support Vocational/Educational    Laboratory/X-Ray Psychological Evaluation(s)        Assessment:    AXIS I ADHD, inattentive type, Generalized Anxiety Disorder and Mood Disorder NOS  AXIS II Deferred  AXIS III  Past Medical History  Diagnosis Date  . Anxiety     Dr. Evelene Croon  . Depression   . Bipolar 2 disorder      AXIS IV educational problems and problems related to social environment  AXIS V 51-60 moderate symptoms   Treatment Plan/Recommendations:  Plan of Care:  admit to IOP and continue medications per outpatient psychiatrist. Refer for individual therapy after completion of IOP   Laboratory:    Psychotherapy:  attend groups   Medications:   Abilify   20 mg daily   Wellbutrin 450 mg daily   Klonopin   2 mg twice daily   Khedezla   50 mg daily    Routine PRN Medications:  No  Consultations:  none   Safety Concerns:   suicidal thoughts  Other:      [facility] 9/17/201410:36 AM

## 2013-04-09 ENCOUNTER — Other Ambulatory Visit (HOSPITAL_COMMUNITY): Payer: BC Managed Care – PPO | Admitting: Psychiatry

## 2013-04-09 DIAGNOSIS — F419 Anxiety disorder, unspecified: Secondary | ICD-10-CM

## 2013-04-09 DIAGNOSIS — F329 Major depressive disorder, single episode, unspecified: Secondary | ICD-10-CM

## 2013-04-09 NOTE — Progress Notes (Signed)
    Daily Group Progress Note  Program: IOP  Group Time: 9:00-10:30 am   Participation Level: Active  Behavioral Response: Appropriate  Type of Therapy:  Process Group  Summary of Progress: Pt shared about feeling like he failed at school and with romantic relationships and feeling uncertain about his future and how this contributes to feelings of depression.      Group Time: 10:30 am - 12:00 pm   Participation Level:  Active  Behavioral Response: Appropriate  Type of Therapy: Psycho-education Group  Summary of Progress: Pt learned about anxiety symptoms and how to identify them to reduce severity.  Carman Ching, LCSW

## 2013-04-09 NOTE — Progress Notes (Signed)
    Daily Group Progress Note  Program: IOP  Group Time: 9:00-10:30 am   Participation Level: Active  Behavioral Response: Appropriate  Type of Therapy:  Process Group  Summary of Progress: Pt shared feeling like he is not happy with where he is in his educational and personal life and how he compares himself to his peers and becomes disappointed in himself. Pt also said he gets angry with his parents because he feels that is the only way he can release feelings of anger and he lacks other effective coping skills.      Group Time: 10:30 am - 12:00 pm   Participation Level:  Active  Behavioral Response: Appropriate  Type of Therapy: Psycho-education Group  Summary of Progress: Pt learned about Heartmath and how to use it to reduce feelings of anxiety.   Carman Ching, LCSW

## 2013-04-10 ENCOUNTER — Other Ambulatory Visit (HOSPITAL_COMMUNITY): Payer: BC Managed Care – PPO | Admitting: Psychiatry

## 2013-04-10 DIAGNOSIS — F329 Major depressive disorder, single episode, unspecified: Secondary | ICD-10-CM

## 2013-04-10 DIAGNOSIS — F419 Anxiety disorder, unspecified: Secondary | ICD-10-CM

## 2013-04-10 NOTE — Progress Notes (Signed)
    Daily Group Progress Note  Program: IOP  Group Time: 9:00-10:30 am   Participation Level: Active  Behavioral Response: Appropriate  Type of Therapy:  Process Group  Summary of Progress: Pt appears to participate in the parts of group that interest him and disengages from the other portions. Pt was redirected from texting during group time while others where sharing. Pt had minimal participating but continues focusing on not being where he wants to be in school and his personal life. He thinks he was thrown into depression due to being overwhelmed with too many obligations.      Group Time: 10:30 am - 12:00 pm   Participation Level:  Active  Behavioral Response: Appropriate  Type of Therapy: Psycho-education Group  Summary of Progress:  Pt practiced using the breathing technique - heartmath - to manage anxiety and stress.   Carman Ching, LCSW

## 2013-04-13 ENCOUNTER — Other Ambulatory Visit (HOSPITAL_COMMUNITY): Payer: BC Managed Care – PPO | Admitting: Psychiatry

## 2013-04-13 DIAGNOSIS — F419 Anxiety disorder, unspecified: Secondary | ICD-10-CM

## 2013-04-13 DIAGNOSIS — F329 Major depressive disorder, single episode, unspecified: Secondary | ICD-10-CM

## 2013-04-13 NOTE — Progress Notes (Signed)
    Daily Group Progress Note  Program: IOP  Group Time: 9:00-10:30 am   Participation Level: Active  Behavioral Response: Appropriate  Type of Therapy:  Process Group  Summary of Progress: Pt reports feeling "ok". He chose to step out of the room during relaxation out of fear he would not be able to sit still, but was able to sit still and be attentive the entire group. Pt provided support to another group member but did not share personally on how he is doing. He has been withdrawn about sharing his own stressors unless called upon. He is being encouraged to share more.      Group Time: 10:30 am - 12:00 pm   Participation Level:  Active  Behavioral Response: Appropriate  Type of Therapy: Psycho-education Group  Summary of Progress: Pt participated in a group with a focus on grief and loss and identified current losses impacted overall wellness and effective grieving strategies.   Carman Ching, LCSW

## 2013-04-14 ENCOUNTER — Telehealth (HOSPITAL_COMMUNITY): Payer: Self-pay | Admitting: Psychiatry

## 2013-04-14 ENCOUNTER — Other Ambulatory Visit (HOSPITAL_COMMUNITY): Payer: BC Managed Care – PPO

## 2013-04-15 ENCOUNTER — Other Ambulatory Visit (HOSPITAL_COMMUNITY): Payer: BC Managed Care – PPO | Admitting: Psychiatry

## 2013-04-15 DIAGNOSIS — F419 Anxiety disorder, unspecified: Secondary | ICD-10-CM

## 2013-04-15 DIAGNOSIS — F329 Major depressive disorder, single episode, unspecified: Secondary | ICD-10-CM

## 2013-04-15 NOTE — Progress Notes (Signed)
    Daily Group Progress Note  Program: IOP  Group Time: 9:00-10:30 am   Participation Level: Active  Behavioral Response: Appropriate  Type of Therapy:  Process Group  Summary of Progress: Pt presents with improved mood and affect. He provided empathy to other members, but did not voluntarily share about his own current stressors. Pt is being encouraged to share and assertive his needs within the group. Pt continues to work on dealing with issues of depression related to school and relationship stressors and feeling dissatisfied with where he is in his life.      Group Time: 10:30 am - 12:00 pm   Participation Level:  Active  Behavioral Response: Appropriate  Type of Therapy: Psycho-education Group  Summary of Progress: Pt learned the DBT skill of Distress Tolerance and how to use ACCEPTS to manage difficult feelings.   Carman Ching, LCSW

## 2013-04-16 ENCOUNTER — Other Ambulatory Visit (HOSPITAL_COMMUNITY): Payer: BC Managed Care – PPO

## 2013-04-16 ENCOUNTER — Telehealth (HOSPITAL_COMMUNITY): Payer: Self-pay | Admitting: Psychiatry

## 2013-04-17 ENCOUNTER — Other Ambulatory Visit (HOSPITAL_COMMUNITY): Payer: BC Managed Care – PPO | Admitting: Psychiatry

## 2013-04-17 DIAGNOSIS — F329 Major depressive disorder, single episode, unspecified: Secondary | ICD-10-CM

## 2013-04-17 DIAGNOSIS — F419 Anxiety disorder, unspecified: Secondary | ICD-10-CM

## 2013-04-17 NOTE — Progress Notes (Signed)
    Daily Group Progress Note  Program: IOP  Group Time: 9:00-10:30 am   Participation Level: Active  Behavioral Response: Appropriate  Type of Therapy:  Process Group  Summary of Progress: Pt was quiet until called upon to share. Pt struggles with participating in the relaxation activity each day and appears "accelerated". Pt said he has racing, worrisome thoughts that get out of control when he is alone and not distracted from them. He expressed frustrations with his depression episodes and said he still does not know what direction his life should take both personally and professionally.      Group Time: 10:30 am - 12:00 pm   Participation Level:  Active  Behavioral Response: Appropriate  Type of Therapy: Psycho-education Group  Summary of Progress: Pt learned about the DBT skill of Distress Tolerance and how to use the skills to manage difficult feelings and situations.   Carman Ching, LCSW

## 2013-04-20 ENCOUNTER — Other Ambulatory Visit (HOSPITAL_COMMUNITY): Payer: BC Managed Care – PPO | Admitting: Psychiatry

## 2013-04-20 DIAGNOSIS — F329 Major depressive disorder, single episode, unspecified: Secondary | ICD-10-CM

## 2013-04-20 DIAGNOSIS — F419 Anxiety disorder, unspecified: Secondary | ICD-10-CM

## 2013-04-20 NOTE — Progress Notes (Signed)
    Daily Group Progress Note  Program: IOP  Group Time: 9:00-10:30 am   Participation Level: Active  Behavioral Response: Appropriate  Type of Therapy:  Process Group  Summary of Progress: Pt reports feeling very tired today due to not sleeping. Pt shared for the first time about being severely bullied from 7th-10th grade and how this caused his onset of depression. Pt said he never felt like he "fit" in anywhere and this pattern repeated in college recently. Pt is struggling with low self-esteem from these experiences and trying to figure out where he feels he fits. He is talking more and receiving support each day.      Group Time: 10:30 am - 12:00 pm   Participation Level:  None  Behavioral Response: none  Type of Therapy: none  Summary of Progress: group ended early today  Niasha Devins E, LCSW

## 2013-04-21 ENCOUNTER — Other Ambulatory Visit (HOSPITAL_COMMUNITY): Payer: BC Managed Care – PPO | Admitting: Psychiatry

## 2013-04-21 DIAGNOSIS — F419 Anxiety disorder, unspecified: Secondary | ICD-10-CM

## 2013-04-21 DIAGNOSIS — F329 Major depressive disorder, single episode, unspecified: Secondary | ICD-10-CM

## 2013-04-21 NOTE — Progress Notes (Signed)
    Daily Group Progress Note  Program: IOP  Group Time: 9:00-10:30 am   Participation Level: Active  Behavioral Response: Appropriate  Type of Therapy:  Process Group  Summary of Progress: pt reports improved mood for the first time since joining the group. Pt has opened up a lot this week and shared about being bullied and the effect this had on his self-esteem and depression. Today Pt processed the rejection of a romantic relationship and how he is still attached to this girl from two years ago and does not know how to move on. Pt appears to have som socially awkward behaviors and struggles on some level interaction socially with others. Pt is aware of this difference and it causes him sadness. Pt said he would like to have a relationship with a girl that is meaningful.      Group Time: 10:30 am - 12:00 pm   Participation Level:  Active  Behavioral Response: Appropriate  Type of Therapy: Psycho-education Group  Summary of Progress: Pt learned about the skill of mindfulness to use to reduce anxiety and depression.   Carman Ching, LCSW

## 2013-04-22 ENCOUNTER — Other Ambulatory Visit (HOSPITAL_COMMUNITY): Payer: BC Managed Care – PPO | Attending: Psychiatry | Admitting: Psychiatry

## 2013-04-22 DIAGNOSIS — F332 Major depressive disorder, recurrent severe without psychotic features: Secondary | ICD-10-CM | POA: Insufficient documentation

## 2013-04-22 DIAGNOSIS — F329 Major depressive disorder, single episode, unspecified: Secondary | ICD-10-CM

## 2013-04-22 DIAGNOSIS — F419 Anxiety disorder, unspecified: Secondary | ICD-10-CM

## 2013-04-22 NOTE — Progress Notes (Signed)
    Daily Group Progress Note  Program: IOP  Group Time: 9:00-10:30 am   Participation Level: Active  Behavioral Response: Appropriate  Type of Therapy:  Process Group  Summary of Progress: Pt reports feeling "very tired from not sleeping well last night". Pt said he has not been taking his sleep medication regularly and was encouraged to take it nightly. Pt expressed feeling "discouraged" last night when his friends chose to leave his house to do a different activity that Pt was not interested in. Pts feelings were hurt and he felt abandoned by them. Pt becomes depressed when his social relationships don't go as planned. Pt also struggles with low self esteem and staying confident with himself no matter the opinions of others.      Group Time: 10:30 am - 12:00 pm   Participation Level:  Active  Behavioral Response: Appropriate  Type of Therapy: Psycho-education Group  Summary of Progress: Pt was given an introduction to boundary setting and learned about personality styles that interfere with feeling comfortable setting healthy limits with others.   Carman Ching, LCSW

## 2013-04-23 ENCOUNTER — Other Ambulatory Visit (HOSPITAL_COMMUNITY): Payer: BC Managed Care – PPO | Admitting: Psychiatry

## 2013-04-23 DIAGNOSIS — F419 Anxiety disorder, unspecified: Secondary | ICD-10-CM

## 2013-04-23 DIAGNOSIS — F329 Major depressive disorder, single episode, unspecified: Secondary | ICD-10-CM

## 2013-04-23 NOTE — Progress Notes (Signed)
    Daily Group Progress Note  Program: IOP  Group Time: 9:00-10:30 am   Participation Level: Active  Behavioral Response: Appropriate  Type of Therapy:  Process Group  Summary of Progress: Pt reports elevated mood again today, but described difficulty with being made fun of at work by a co-worker. Pt described examples of not standing up for himself and struggles with feeling guilty setting limits with others. Pt is working on Psychiatrist to help him feel more confident in himself and interact more effectively with others.      Group Time: 10:30 am - 12:00 pm   Participation Level:  Active  Behavioral Response: Appropriate  Type of Therapy: Psycho-education Group  Summary of Progress: pt learned about the ways to set healthy boundaries with others to ensure wellness and practiced using personal life examples.   Carman Ching, LCSW

## 2013-04-24 ENCOUNTER — Other Ambulatory Visit (HOSPITAL_COMMUNITY): Payer: BC Managed Care – PPO | Admitting: Psychiatry

## 2013-04-24 DIAGNOSIS — F329 Major depressive disorder, single episode, unspecified: Secondary | ICD-10-CM

## 2013-04-24 DIAGNOSIS — F419 Anxiety disorder, unspecified: Secondary | ICD-10-CM

## 2013-04-24 NOTE — Progress Notes (Signed)
    Daily Group Progress Note  Program: IOP  Group Time: 9:00-10:30 am   Participation Level: Active  Behavioral Response: Appropriate  Type of Therapy:  Process Group  Summary of Progress: Pt said he had anxiety and depression last night after reflecting on learning that he needs to quit his second job at [employer]. Pt said it was too much to balance school and his other responsibilities and it was making him unwell. Pt said he discussed the decision with his parents and they were in support of it. Pt drafted an email notifying his employer of his resignation, but was hesitant to send it because he was unsure of himself and his working. Pt was given reassurance from the group and encouraged to be more confident in himself and his own decisions. Pt is working on improving his self-esteem and self-confidence.      Group Time: 10:30 am - 12:00 pm   Participation Level:  Active  Behavioral Response: Appropriate  Type of Therapy: Psycho-education Group  Summary of Progress: Pt learned about how to use a communication technique to enhance listening and communication.   Carman Ching, LCSW

## 2013-04-27 ENCOUNTER — Other Ambulatory Visit (HOSPITAL_COMMUNITY): Payer: BC Managed Care – PPO | Admitting: Psychiatry

## 2013-04-27 DIAGNOSIS — F329 Major depressive disorder, single episode, unspecified: Secondary | ICD-10-CM

## 2013-04-27 DIAGNOSIS — F419 Anxiety disorder, unspecified: Secondary | ICD-10-CM

## 2013-04-27 NOTE — Progress Notes (Signed)
    Daily Group Progress Note  Program: IOP  Group Time: 9:00-10:30 am   Participation Level: Active  Behavioral Response: Appropriate  Type of Therapy:  Process Group  Summary of Progress: Pt presented with elevated mood and affect an had a confidence about him that he has not presented with before. Pt bought a card for a member leaving the group and passed it around for others to sign. Pt has shown great compassion and empathy towards other members. Pt said he feels a sense of relief after quitting his second job last week and giving him more time to focus on his own wellness.      Group Time: 10:30 am - 12:00 pm   Participation Level:  Active  Behavioral Response: Appropriate  Type of Therapy: Psycho-education Group  Summary of Progress:  Pt participated in a group with a focus on grief and loss and identified current losses impacting overall wellness.   Carman Ching, LCSW

## 2013-04-28 ENCOUNTER — Other Ambulatory Visit (HOSPITAL_COMMUNITY): Payer: BC Managed Care – PPO | Admitting: Psychiatry

## 2013-04-28 ENCOUNTER — Ambulatory Visit (HOSPITAL_COMMUNITY): Payer: Self-pay | Admitting: Psychiatry

## 2013-04-28 DIAGNOSIS — F419 Anxiety disorder, unspecified: Secondary | ICD-10-CM

## 2013-04-28 DIAGNOSIS — F329 Major depressive disorder, single episode, unspecified: Secondary | ICD-10-CM

## 2013-04-28 NOTE — Progress Notes (Signed)
Patient ID: Alexander Harrington, male   DOB: 1990-12-18, 22 y.o.   MRN: [id number] D: This is a 22 year old, single, caucasian male, admitted due to worsening depressive and anxiety symptoms with SI (plan: OD). Discussed safety options with pt. Pt was able to contract for safety. Previous suicide attempt (overdose on 11-17-10). Also, a hx of self injurious harm (superficial cuts on upper arms, hands and legs.) States recent self mutilation was ~ one year ago, when he was admitted at [facility]. Denies HI or A/V hallucinations. Reports symptoms worsening a couple of weeks ago.  Patient reports feeling very depressed, a lot of crying spells, unable to function at school and self isolation. "I have been having a very difficult time sleeping at night. I have problems completing home work, unable to attend classes because I was feeling down and out. I feel very fatigued emotionally and physically tired to complete any task. I have a lot of anxiety and unable to focus. I have been depressed x 2 years." Trigger/Stressors: 1) School ([employer]: Senior Year): Pt moved into the dorm August 2014. " I transferred from the [employer] to [employer] (Fall 2012) because the program that I was doing at the [employer] was not going well. I had to transfer to this school that I don't like. I also, left my friends at my old school, one friend in particular, a girl. We were very good friends, but I think that I was falling in love with her. I can't get to tell her how I feel about her because she stopped talking to me. She has visited me once here at [facility] since I transferred out of the [employer] of the [employer]. I can't get her off my mind. I am constantly thinking about what she is doing and whom, I keep thinking, who is she with now? Since starting at [employer], I have been living with my parents to curtail some college cost. I find myself feeling detatched from my parents. I believe living at home with my parents is making my depression worse. I don't  like [employer]. " 2) Relationship Issues: "I have a fear of being alone." Reports meeting a male at a conference in [place] this summer. Apparently, the male hasn't made contact with pt. Pt reports having conflict with his parents due to school. "We argue a lot about school. There's a lot of miscommunication." Reports that mother suffers with severe depression and currently gets ECT at [facility]. Pt completed MH-IOP today.  Reports continued struggle with flucuating mood, crying spells, negative thinking, isolation, low self esteem, poor sleep, and panic attacks.  Denies current SI/HI or A/V hallucinations.  Admits to some paranoia.  "I feel like there is a conspiracy against me."  Pt is applying coping skills learned.  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  Will return to other job tomorrow, without any restrictions.  A:  D/C today.  F/U with Dr. Evelene Croon on 05-22-13 @ 1:15pm and Boneta Lucks, [facility] on 05-13-13 @ 1:30pm.  Encouraged support groups. RTW tomorrow.  R:  Pt receptive.

## 2013-04-28 NOTE — Progress Notes (Signed)
    Daily Group Progress Note  Program: IOP  Group Time: 9:00-10:30 am   Participation Level: Active  Behavioral Response: Appropriate  Type of Therapy:  Process Group  Summary of Progress: Today was Pts final day in the group and he reports minimal depression and anxiety symptoms and improved self esteem. Pt had brought memorable gifts and personalized cards for each member and said goodbye to them and told them how much they have contributed to his wellness. Pt processed a past history of being bullied and his struggles with romantic relationships and gained some self-confidence through the support from the group members. Pt said he was "sad to leave".      Group Time: 10:30 am - 12:00 pm   Participation Level:  Active  Behavioral Response: Appropriate  Type of Therapy: Psycho-education Group  Summary of Progress: Pt participated in his goodbye ceremony and had closure with the other group members.   Carman Ching, LCSW

## 2013-04-28 NOTE — Progress Notes (Signed)
Patient ID: Alexander Harrington, male   DOB: 10/23/1990, 22 y.o.   MRN: [id number] Discharge Note  Patient:  Deklen Popelka is an 22 y.o., male DOB:  Jun 23, 1991  Date of Admission:  04/07/13  Date of Discharge:  04/28/13  Reason for Admission:  Patient is a 22 year old Caucasian single employed man who was referred from her psychiatrist for intensive outpatient program as patient was experiencing increased depression, anxiety and having worsening of depression.  Patient was complaining of crying spells, passive suicidal thoughts and needed help.  Please see admission note for more details.  Hospital Course:  Patient participated in intensive outpatient program.  There were no changes in his medication.  He was verbal and vocal in the program.  He reported less intense symptoms with the medication and group therapy.  His psychiatrist recently started antidepressant with change in his doses and a decision was made not to change his medication and doses.  Patient denies any side effects of medication.  He is sleeping better.  He denies any hallucinations, paranoia or any active or passive suicidal thought.  He still has a lot of anxiety and nervousness and sometime feeling groggy with the Klonopin but denies any aggression violence are poor impulse control.  Patient is living with a roommate however he has good family support.  His parent's lives close by.  He has cut down his job and currently working on one job.  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  He scheduled to see his psychiatrist on October 31 and he will see Boneta Lucks his counselor on October 22.  Patient does not report any side effects of medication.  Mental Status at Discharge:  The patient is a young man who is casually dressed and fairly groomed.  He appears anxious and described his mood as nervous .  His affect is mood appropriate.  He maintained good eye contact.  He denies any active or passive suicidal thoughts or homicidal thoughts.  There  were no flight of ideas or any loose association.  He denies any auditory or visual hallucination.  There were no paranoia or delusions present at this time.  He has no tremors or shakes.  His attention and concentration is fair.  He is alert and oriented x3.  His insight judgment and impulse control is okay.  Lab Results: No results found for this or any previous visit (from the past 48 hour(s)).  Current outpatient prescriptions:ARIPiprazole (ABILIFY) 20 MG tablet, Take 20 mg by mouth daily., Disp: , Rfl: ;  buPROPion (WELLBUTRIN XL) 150 MG 24 hr tablet, Take 3 tablets (450 mg total) by mouth daily. For depression., Disp: 30 tablet, Rfl: 0;  clonazePAM (KLONOPIN) 2 MG tablet, Take 2 mg by mouth 2 (two) times daily., Disp: , Rfl: ;  Desvenlafaxine ER (KHEDEZLA) 50 MG TB24, Take 50 mg by mouth daily., Disp: , Rfl:  ibuprofen (ADVIL) 200 MG tablet, Take 400 mg by mouth every 6 (six) hours as needed. Headache, pain, Disp: , Rfl: ;  BRINTELLIX 20 MG TABS, , Disp: , Rfl: ;  dextroamphetamine (DEXEDRINE SPANSULE) 15 MG 24 hr capsule, , Disp: , Rfl:   Axis Diagnosis:   Axis I: Major Depression, Recurrent severe Axis II: Deferred Axis III:  Past Medical History  Diagnosis Date  . Anxiety     Dr. Evelene Croon  . Depression   . Bipolar 2 disorder    Axis IV: other psychosocial or environmental problems and problems with primary support group Axis V: 61-70 mild  symptoms   Level of Care:  OP  Discharge destination:  Home  Is patient on multiple antipsychotic therapies at discharge:  No    Has Patient had three or more failed trials of antipsychotic monotherapy by history:  No  Patient phone:  [phone] (home)  Patient address:   [address] [place] [address],   Follow-up recommendations:  Activity:  As tolerated Diet:  No change  Comments:  Patient will followup with Dr. Evelene Croon on 05/22/13  The patient received suicide prevention pamphlet:  Yes Belongings returned:   Valuables  ARFEEN,SYED T.

## 2013-04-28 NOTE — Patient Instructions (Signed)
Patient completed MH-IOP today.  Will follow up with Boneta Lucks, [facility] on 05-13-13 @ 1:30pm and Dr. Evelene Croon on 05-22-13 @ 1:15pm.  Encouraged support groups.

## 2013-05-13 ENCOUNTER — Ambulatory Visit (HOSPITAL_COMMUNITY): Payer: Self-pay | Admitting: Psychiatry

## 2013-05-27 ENCOUNTER — Ambulatory Visit (INDEPENDENT_AMBULATORY_CARE_PROVIDER_SITE_OTHER): Payer: BC Managed Care – PPO | Admitting: Psychiatry

## 2013-05-27 DIAGNOSIS — F329 Major depressive disorder, single episode, unspecified: Secondary | ICD-10-CM

## 2013-05-28 NOTE — Progress Notes (Signed)
Patient ID: Alexander Harrington, male   DOB: [birth year], 22 y.o.   MRN: [id number]  Presenting Problem Chief Complaint: depression  What are the main stressors in your life right now, how long? Broken hearted regarding romantic interest, lost best friend when he left [employer], left school [employer] and transferred to [employer]  Previous mental health services Have you ever been treated for a mental health problem? Yes. Dr. Evelene Croon  Are you currently seeing a therapist or counselor, counselor's name? Yes   Have you ever had a mental health hospitalization? Yes   Have you ever been treated with medication? Yes   Have you ever had suicidal thoughts? Yes   Risk factors for Suicide Demographic factors:  Male and Adolescent or young adult Current mental status: no current suicidal ideation Loss factors: Loss of significant relationship Historical factors: prior suicidal ideation Risk Reduction factors: Living with another person, especially a relative Clinical factors:  Anxiety, depression Cognitive features that contribute to risk: none  SUICIDE RISK:  Mild:  Suicidal ideation of limited frequency, intensity, duration, and specificity.  There are no identifiable plans, no associated intent, mild dysphoria and related symptoms, good self-control (both objective and subjective assessment), few other risk factors, and identifiable protective factors, including available and accessible social support.  Social/family history Have you been married, how many times?  no  Do you have children?  no  Who lives in your current household? Lives with mother and father  Military history: No   Religious/spiritual involvement:  What religion/faith base are you? deferred  Family of origin (childhood history)  Where were you born? [place] Where did you grow up? [place]  Describe the atmosphere of the household where you grew up: supportive, loving Do you have siblings, step/half siblings,  list names, relation, sex, age? No   Are your parents separated/divorced, when and why? No   Are your parents alive? Yes   Social supports (personal and professional): mother, father, extended family  Education How many grades have you completed? some college Did you have any problems in school, what type? Yes. Depression began in the 7th grade  Medications prescribed for these problems? 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 No   Have you ever been exposed to something traumatic, describe? No   Substance use None reported    Mental Status: General Appearance Luretha Murphy:  Casual Eye Contact:  Good Motor Behavior:  Normal Speech:  Normal Level of Consciousness:  Alert Mood:  Dysphoric Affect:  Blunt Anxiety Level:  minimal Thought Process:  Coherent Thought Content:  WNL Perception:  Normal Judgment:  Good Insight:  Present Cognition:  wnl  Diagnosis AXIS I Depression  AXIS II No diagnosis  AXIS III Past Medical History  Diagnosis Date  . Anxiety     Dr. Evelene Croon  . Depression   . Bipolar 2 disorder     AXIS IV other psychosocial or environmental problems  AXIS V 51-60 moderate symptoms   Plan: Pt. To return in 2 weeks for continued assessment.  _________________________________________           Boneta Lucks, Ph.D., LPC, NCC

## 2013-06-12 ENCOUNTER — Ambulatory Visit (INDEPENDENT_AMBULATORY_CARE_PROVIDER_SITE_OTHER): Payer: BC Managed Care – PPO | Admitting: Psychiatry

## 2013-06-12 ENCOUNTER — Encounter (HOSPITAL_COMMUNITY): Payer: Self-pay | Admitting: Psychiatry

## 2013-06-12 DIAGNOSIS — F332 Major depressive disorder, recurrent severe without psychotic features: Secondary | ICD-10-CM

## 2013-06-12 NOTE — Progress Notes (Signed)
   THERAPIST PROGRESS NOTE  Session Time: 9:00-9:50  Participation Level: Minimal  Behavioral Response: CasualDrowsy and LethargicDysphoric  Type of Therapy: Individual Therapy  Treatment Goals addressed: emotion regulation, self-esteem  Interventions: CBT  Summary: Alexander Harrington is a 22 y.o. male who presents with depression.   Suicidal/Homicidal: Nowithout intent/plan  Therapist Response: Pt. Presents as tired, lethargic, flat affect. Pt. Complains of headache and that he has not been able to sleep during the past week because of anxiety. 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 Focused on Pt. Strengths i.e., creativity, intelligence, desire for authenticity/genuineness in others.  Plan: Return again in 2 weeks.  Diagnosis: Axis I: Major Depression, Recurrent severe    Axis II: No diagnosis    Wynonia Musty 06/12/2013

## 2013-06-15 ENCOUNTER — Ambulatory Visit (HOSPITAL_COMMUNITY): Payer: Self-pay | Admitting: Psychiatry

## 2013-06-22 ENCOUNTER — Ambulatory Visit (HOSPITAL_COMMUNITY): Payer: Self-pay | Admitting: Psychiatry

## 2013-08-31 ENCOUNTER — Ambulatory Visit (HOSPITAL_COMMUNITY): Payer: Self-pay | Admitting: Psychiatry

## 2013-10-16 ENCOUNTER — Ambulatory Visit (INDEPENDENT_AMBULATORY_CARE_PROVIDER_SITE_OTHER): Payer: BC Managed Care – PPO | Admitting: Psychiatry

## 2013-10-16 ENCOUNTER — Ambulatory Visit (HOSPITAL_COMMUNITY): Payer: Self-pay | Admitting: Psychiatry

## 2013-10-16 DIAGNOSIS — F332 Major depressive disorder, recurrent severe without psychotic features: Secondary | ICD-10-CM

## 2013-10-16 NOTE — Progress Notes (Signed)
   THERAPIST PROGRESS NOTE  Session Time: 3:00-4:00  Participation Level: Active  Behavioral Response: CasualAlertDepressed  Type of Therapy: Individual Therapy  Treatment Goals addressed: emotion regulation, coping skills  Interventions: CBT  Summary: Remijio Holleran is a 23 y.o. male who presents with severe depression.   Suicidal/Homicidal: Nowithout intent/plan  Therapist Response: Pt. Reports that he continues to struggle with severe depression. Pt. Reports that he had to reduce class schedule for the semester to 3 hours because of poor motivation and lethargy. Pt. Still depends on parents as primary source of support. Pt. Receives messages from father that reinforce sense of failure regarding ability to effectively manage depression and feeling that he is a failure for having to delay school progress. Session focused on developing positive coping skills (i.e., journaling, free writing without judgement of what is on the page) and challenges "ideal" image of self and acceptance of authentic self that is reflective of genuine interests, dreams, desires for future behavior.  Plan: Return again in 3 weeks.  Diagnosis: Axis I: Depressive Disorder NOS    Axis II: No diagnosis    Renford Dills 10/16/2013

## 2013-11-17 ENCOUNTER — Ambulatory Visit (INDEPENDENT_AMBULATORY_CARE_PROVIDER_SITE_OTHER): Payer: BC Managed Care – PPO | Admitting: Psychiatry

## 2013-11-17 DIAGNOSIS — F332 Major depressive disorder, recurrent severe without psychotic features: Secondary | ICD-10-CM

## 2013-11-17 NOTE — Progress Notes (Signed)
   THERAPIST PROGRESS NOTE Session Time: 3:00-3:50   Participation Level: Active   Behavioral Response: CasualAlertDepressed   Type of Therapy: Individual Therapy   Treatment Goals addressed: emotion regulation, coping skills   Interventions: CBT   Summary: Alexander Harrington is a 23 y.o. male who presents with severe depression.   Suicidal/Homicidal: Nowithout intent/plan   Therapist Response: Pt. Continues to present as depressed, flat affect, blunted speech. Pt. Reports that the 3 hour class that he is taking was a disappointment because he was very familiar with the subject matter and had strong ideas about how the class should be taught. Pt. Reports that significant stressor is decision making about what he should do for the summer. Pt. Reports that his parents think it would be a good idea for him to leave [facility] for the summer but he is not sure if he should go to [place] or [place] where he has a few friends or family. Pt. Reports that he would like to go to [place] but his parents are concerned about his ability to travel alone. Pt. Demonstrates significant difficulty when asked to assess the costs and benefits associated with each option. Pt. Reports that he feels sleepy and lethargic most of the time. Pt. Reports that he has lost 10-15 pounds in the last month because he is now prescribed stimulants, but he cannot report any change in the severity of his depression. Pt. Rates today's level of depression a 6.5 on 10 point depression scale. [name] was introduced to [employer] joined the session to provide information and handouts for [name] to consider with his parents. Pt. Was encouraged to find time during the course of the day and ask himself "what do I want most at this moment" and to keep a journal of things that give him contentment and pleasure. Pt. Was encouraged to eat when hungry, try to sleep when he feels tired and to engage in physical exercise when he feels energy.  Plan:  Return again in 3 weeks.   Diagnosis: Axis I: Depressive Disorder NOS   Axis II: No diagnosis   Renford Dills 11/17/2013

## 2013-11-24 ENCOUNTER — Ambulatory Visit (HOSPITAL_COMMUNITY): Payer: Self-pay | Admitting: Psychiatry

## 2013-12-01 ENCOUNTER — Encounter (HOSPITAL_COMMUNITY): Payer: Self-pay | Admitting: Psychiatry

## 2013-12-01 ENCOUNTER — Ambulatory Visit (INDEPENDENT_AMBULATORY_CARE_PROVIDER_SITE_OTHER): Payer: BC Managed Care – PPO | Admitting: Psychiatry

## 2013-12-01 VITALS — BP 141/99 | HR 117 | Ht 70.0 in | Wt 219.6 lb

## 2013-12-01 DIAGNOSIS — F411 Generalized anxiety disorder: Secondary | ICD-10-CM

## 2013-12-01 DIAGNOSIS — F909 Attention-deficit hyperactivity disorder, unspecified type: Secondary | ICD-10-CM

## 2013-12-01 DIAGNOSIS — F332 Major depressive disorder, recurrent severe without psychotic features: Secondary | ICD-10-CM

## 2013-12-01 NOTE — Progress Notes (Signed)
Psychiatric Assessment Adult  Patient Identification:  Alexander Harrington Date of Evaluation:  12/01/2013 Chief Complaint: Alexander Harrington Consult  History of Chief Complaint:  No chief complaint on file.   HPI Patient is a 23 years old with h/o MDD, recurrent severe, per Dr. Harlene Ramus He reports having depression since middle school, age 75 years old, and ADHD. He's been on multiple antidepressants. Medications he's tried:  brintellix 20 mg po QD, abilify 20 mg. Currently, taking des venlafaxine ER 50 mg, bupropion XL 150 mg po x3 , clonazepam 2 mg, twice daily.  Currently, in psychotherapy with Eloise Levels x 6 months. Sleeping 6-8 hours, sometimes more or less; appetite is poor. Patient is also on Dextroamphetamine 15 mg x 2 in AM for ADHD. Tried to commit suicide in April 2012 via overdose. He denies SI/HI/AVH. He denies any psychological or neurological conditions. He denies metal devices, or tumors, no pacemakers, no defibrillators, no ICP, no head trauma. He scored 22 on PHQ9, 44 on Beck's Test, which is extreme. Discussed R/R/B/O about [place] with parents and patient. Answered questions of the parents and patient.  Review of Systems Physical Exam  Depressive Symptoms: depressed mood, anhedonia, insomnia, hypersomnia, psychomotor agitation, psychomotor retardation, fatigue, feelings of worthlessness/guilt, hopelessness, impaired memory, anxiety, panic attacks, insomnia, hypersomnia, loss of energy/fatigue, disturbed sleep, weight loss, weight gain, increased appetite, decreased appetite,  (Hypo) Manic Symptoms:   Elevated Mood:  No Irritable Mood:  No Grandiosity:  No Distractibility:  Yes Labiality of Mood:  No Delusions:  No Hallucinations:  No Impulsivity:  No Sexually Inappropriate Behavior:  No Financial Extravagance:  No Flight of Ideas:  No  Anxiety Symptoms: Excessive Worry:  Yes Panic Symptoms:  Yes Agoraphobia:  No Obsessive Compulsive: Yes  Symptoms: None, Specific  Phobias:  No Social Anxiety:  No  Psychotic Symptoms:  Hallucinations: No None Delusions:  No Paranoia:  No   Ideas of Reference:  No  PTSD Symptoms: Ever had a traumatic exposure:  No Had a traumatic exposure in the last month:  No Re-experiencing: No None Hypervigilance:  No Hyperarousal: No None Avoidance: No None  Traumatic Brain Injury: No   Past Psychiatric History: Diagnosis:MDD, recurrent, severe; Anxiety unspecified  Hospitalizations: once 2013 to [place]  Outpatient Care:yes   Substance Abuse Care: no  Self-Mutilation: cutting, last time in 2013  Suicidal Attempts: once   Violent Behaviors: destruction of property    Past Medical History:   Past Medical History  Diagnosis Date  . Anxiety     Dr. Toy Care  . Depression   . Bipolar 2 disorder    History of Loss of Consciousness:  No Seizure History:  No Cardiac History:  No Allergies:   Allergies  Allergen Reactions  . Amphetamines Other (See Comments)    Caused depression that went away after stopping the amphetamine  . Benzodiazepines Other (See Comments)    Got cloudy thinking, loss of memory, suicidal thinking on Klonopin.  . Codeine Rash   Current Medications:  Current Outpatient Prescriptions  Medication Sig Dispense Refill  . ARIPiprazole (ABILIFY) 20 MG tablet Take 20 mg by mouth daily.      Marland Kitchen BRINTELLIX 20 MG TABS       . buPROPion (WELLBUTRIN XL) 150 MG 24 hr tablet Take 3 tablets (450 mg total) by mouth daily. For depression.  30 tablet  0  . clonazePAM (KLONOPIN) 2 MG tablet Take 2 mg by mouth 2 (two) times daily.      Marland Kitchen Desvenlafaxine ER [facility])  50 MG TB24 Take 50 mg by mouth daily.      Marland Kitchen dextroamphetamine (DEXEDRINE SPANSULE) 15 MG 24 hr capsule       . ibuprofen (ADVIL) 200 MG tablet Take 400 mg by mouth every 6 (six) hours as needed. Headache, pain       No current facility-administered medications for this visit.    Previous Psychotropic Medications:  Medication Dose   Dexedrine   15 mg x 2  Clonazepam   2 mg, 2 times daily  Bupropion XL   150 mg x 3   Brintellix   20 mg  Des venlafaxine ER   50 mg  Abilify    20 mg       Substance Abuse History in the last 12 months:None  Substance Age of 1st Use Last Use Amount Specific Type  Nicotine      Alcohol  age 55   last night   3 bottles of beer    Cannabis      Opiates      Cocaine      Methamphetamines      LSD      Ecstasy      Benzodiazepines  age 46   last night   clonazepam 2 mg HS   Caffeine      Inhalants      Others:                         Social History: Current Place of Residence:  [place] of Birth: [place] Family Members: lives with parents  Marital Status:  Single Children:  Na   Sons: na   Daughters:  Relationships: None  Education:  Tree surgeon, media studies  Educational Problems/Performance:  Religious Beliefs/Practices:Not religous History of Abuse: none 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 Legal History: none  Hobbies/Interests: writing, reading, watching films   Family History:   Family History  Problem Relation Age of Onset  . Arthritis Mother   . Mental illness Mother   . Depression Mother   . Drug abuse Maternal Grandfather   . Alcohol abuse Maternal Grandfather   . Drug abuse Maternal Grandmother   . Alcohol abuse Maternal Grandmother     Mental Status Examination/Evaluation: Objective:  Appearance: Casual  Eye Contact::  Fair  Speech:  Slow  Volume:  Decreased  Mood:  Depressed and anxious  Affect:  Depressed  Thought Process:  Goal Directed, Linear and Logical  Orientation:  Full (Time, Place, and Person)  Thought Content:  Obsessions and Rumination  Suicidal Thoughts:  No  Homicidal Thoughts:  No  Judgement:  Fair  Insight:  Fair  Psychomotor Activity:  Psychomotor Retardation  Akathisia:  No  Handed:  Right  AIMS (if indicated):  No Abnormal Movement   Assets:  Leisure Time Physical  Health Resilience Social Support    Laboratory/X-Ray Psychological Evaluation(s)   NA Dr. Dwyane Dee MD/Latria Mccarron   Assessment:  Axis I: ADHD, combined type and Major Depression, Recurrent severe  AXIS I ADHD, combined type and Major Depression, Recurrent severe  AXIS II Deferred  AXIS III Past Medical History  Diagnosis Date  . Anxiety     Dr. Toy Care  . Depression   . Bipolar 2 disorder      AXIS IV economic problems, educational problems, housing problems, occupational problems, other psychosocial or environmental problems, problems related to legal system/crime, problems related to social environment, problems with  access to health care Harrington and problems with primary support group  AXIS V 41-50 serious symptoms   Treatment Plan/Recommendations:Patient is a 23 years old with h/o MDD, recurrent severe, per Dr. Harlene Ramus He reports having depression since middle school, age 47 years old, and ADHD. He's been on multiple antidepressants.Medications he's tried:  brintellix 20 mg po QD. Currently, taking fetzima, bupropion XL 150 mg po, clonazepam 1 mg prn. Currently, he is in psychotherapy with Eloise Levels x 6 months. Sleeping 6-8 hours, sometimes more or less; appetite is poor. Patient is also on Dextroamphetamine 15 mg x 2 in AM for ADHD.Tried to commit suicide in April 2012 via overdose. He denies SI/HI/AVH. He denies any psychological or neurological conditions. He denies metal devices, or tumors, no pacemakers, no defibrillators, no ICP, no head trauma. He scored 22 on PHQ9, 44 on Beck's Test, which is extreme. Discussed R/R/B/O about [place] with parents and patient. Answered questions of the parents and patient. Explained that we would have to taper off clonazepam because it lowers the seizure threshold. Educated patient about alcohol use and TMS, and the risk while undergoing this procedure. Patient states he drinks occasionally with friends. Pt verbalized understanding. If pt can come off  the clonazepam, he would be a candidate for [place]. He has refractory depression; he's been on 4 different antidepressants, without relief. He has gone through several rounds of psychotherapy. Patient will need to learn to develop coping skills to handle anxiety.   Plan of Care: medication and therapy  Laboratory:  NA  Psychotherapy: yes   Medications: Bupropion XL 150 mg po; Desvenlaxafine 40 mg po, Clonazepam 2mg , two times daily prn anxiety  Routine PRN Medications:  Yes  Consultations: as needed   Safety Concerns:  None   Other:      Madison Hickman, NP 5/12/20151:13 PM

## 2013-12-15 ENCOUNTER — Ambulatory Visit (HOSPITAL_COMMUNITY): Payer: Self-pay | Admitting: Psychiatry

## 2013-12-29 ENCOUNTER — Ambulatory Visit (INDEPENDENT_AMBULATORY_CARE_PROVIDER_SITE_OTHER): Payer: BC Managed Care – PPO | Admitting: Psychiatry

## 2013-12-29 DIAGNOSIS — F332 Major depressive disorder, recurrent severe without psychotic features: Secondary | ICD-10-CM

## 2013-12-30 NOTE — Progress Notes (Signed)
   THERAPIST PROGRESS NOTE  Session Time: 2:00-2:50  Participation Level: Active   Behavioral Response: CasualAlertDepressed   Type of Therapy: Individual Therapy   Treatment Goals addressed: emotion regulation, coping skills   Interventions: CBT   Summary: Alexander Harrington is a 23 y.o. male who presents with severe depression.   Suicidal/Homicidal: Nowithout intent/plan   Therapist Response: Pt. Continues to present as depressed, flat affect, blunted speech. Pt. Reports recent disappointments including planning his 23rd birthday party and feeling disapointed because several friends were not able to come and others had to leave early. Pt. Also discussed disappointment with his summer work schedule, wanting more hours at work spread across the week instead of 8 hours on Fridays. Pt.'s disappointment was validated and Pt. Was encouraged to think about the situations in terms what he can change, adapt to or accept. Pt. 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 Pt. Discussed disappointment with insurance company as he continues to wait for authorization for [place]. Pt. 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 Pt. Was asked to think about behaviors in the last 24 hours that would support this intention. Pt. Was not able to think about anything in the last 24 hours but was encouraged to think about devoting at least 10 minutes a day to script writing. Discussed pattern of all-or-nothing thinking and how this way of thinking keeps him from starting film projects.  Plan: Return again in 3 weeks.   Diagnosis: Axis I: Depressive Disorder NOS   Axis II: No diagnosis    Renford Dills 12/30/2013

## 2014-01-05 ENCOUNTER — Ambulatory Visit (HOSPITAL_COMMUNITY): Payer: BC Managed Care – PPO | Admitting: Psychiatry

## 2014-01-05 ENCOUNTER — Encounter (HOSPITAL_COMMUNITY): Payer: Self-pay | Admitting: Psychiatry

## 2014-01-05 VITALS — BP 131/89 | HR 125 | Ht 70.0 in | Wt 217.0 lb

## 2014-01-05 DIAGNOSIS — F3189 Other bipolar disorder: Secondary | ICD-10-CM | POA: Insufficient documentation

## 2014-01-05 DIAGNOSIS — F909 Attention-deficit hyperactivity disorder, unspecified type: Secondary | ICD-10-CM | POA: Diagnosis not present

## 2014-01-05 DIAGNOSIS — F411 Generalized anxiety disorder: Secondary | ICD-10-CM | POA: Diagnosis not present

## 2014-01-05 DIAGNOSIS — F332 Major depressive disorder, recurrent severe without psychotic features: Secondary | ICD-10-CM

## 2014-01-05 DIAGNOSIS — Z598 Other problems related to housing and economic circumstances: Secondary | ICD-10-CM | POA: Insufficient documentation

## 2014-01-05 DIAGNOSIS — Z5987 Material hardship due to limited financial resources, not elsewhere classified: Secondary | ICD-10-CM | POA: Insufficient documentation

## 2014-01-05 NOTE — Progress Notes (Signed)
Patient ID: Alexander Harrington, male   DOB: 25-Jun-1991, 23 y.o.   MRN: [id number] Pt reported to [facility] for [facility] for Repetitve Transcranial Magnetic Stimulation treatment for Major Depressive Disorder. Pt also received his first treatment session. Pt completed a PHQ-9 prior to procedure, scoring a 23 (severe depression). Pt also completed a Beck's Depression Inventory, scoring a 41 (extreme depression). Pt read and signed an Informed Consent Agreement for [place] treatment prior to procedure. MD calculated pt's  treatment location and dose. Tx parameters are as follows: Tx A/P -- 12.7 cm, SOA -- 35 degrees, Coil Angle -- 0 degrees, Dose -- 1.36 SMT. Upon beginning tx session, pt endorsed discomfort at the tx site. Coil angle was increased to +5 degrees for pt comfort. Pt reported that this setting felt worse, so coil angle was increased to +10 degrees. Pt reported that this setting was even worse, so coil angle was returned to 0 degrees. Pt was able to tolerate this coil angle setting without issue. Facial twitching was observed, but pt reported that it was tolerable. %MT titrated up to 95%. Will attempt to titrate up to 110% at next treatment.

## 2014-01-06 ENCOUNTER — Other Ambulatory Visit (HOSPITAL_COMMUNITY): Payer: BC Managed Care – PPO | Attending: Psychiatry | Admitting: *Deleted

## 2014-01-06 VITALS — BP 134/86 | HR 124

## 2014-01-06 DIAGNOSIS — F332 Major depressive disorder, recurrent severe without psychotic features: Secondary | ICD-10-CM

## 2014-01-06 NOTE — Progress Notes (Signed)
Patient ID: Alexander Harrington, male   DOB: 1991-01-23, 23 y.o.   MRN: [id number] Pt reported to [facility] for Repetitve Transcranial Magnetic Stimulation treatment for Major Depressive Disorder. Pt reported that he felt exhausted yesterday after treatment. Pt also reported that he had a generalized headache yesterday after treatment "about a 5 or 6" in severity on a scale of 0-10. Pt reported that he "took some Tylenol or Advil" for this, and the headache resolved after about 3 hours. Pt reported that the headache was not unbearable. Pt tolerated tx well. %MT titrated up to 110%. Will attempt to titrate up to 120% at next treatment. Pt reported the tx was not as uncomfortable today as it was the previous day.

## 2014-01-07 ENCOUNTER — Other Ambulatory Visit (HOSPITAL_COMMUNITY): Payer: BC Managed Care – PPO | Attending: Psychiatry | Admitting: *Deleted

## 2014-01-07 VITALS — BP 140/86 | HR 112

## 2014-01-07 DIAGNOSIS — F332 Major depressive disorder, recurrent severe without psychotic features: Secondary | ICD-10-CM | POA: Diagnosis not present

## 2014-01-07 NOTE — Progress Notes (Signed)
Patient ID: Alexander Harrington, male   DOB: 03/26/1991, 23 y.o.   MRN: [id number] Pt reported to [facility] for Repetitve Transcranial Magnetic Stimulation treatment for Major Depressive Disorder. Pt reported that he "had a down day yesterday." Pt reported that he had a mild headache yesterday after tx, but it was not as bad as the one he experienced after his first treatment session. Pt tolerated tx well. %MT titrated up to 115%. Attempted to titrate up to 120%, but NeuroStar system would not allow this increase with the current treatment parameters. Will contact customer service to determine necessary adjustments so the %MT can be increased to 120% at next tx session.

## 2014-01-07 NOTE — Progress Notes (Signed)
Patient ID: Alexander Harrington, male   DOB: 1990-08-25, 23 y.o.   MRN: [id number] [place] motor threshold mapping done

## 2014-01-08 ENCOUNTER — Other Ambulatory Visit (HOSPITAL_COMMUNITY): Payer: Self-pay

## 2014-01-11 ENCOUNTER — Ambulatory Visit (INDEPENDENT_AMBULATORY_CARE_PROVIDER_SITE_OTHER): Payer: BC Managed Care – PPO | Admitting: Psychiatry

## 2014-01-11 ENCOUNTER — Other Ambulatory Visit (HOSPITAL_COMMUNITY): Payer: BC Managed Care – PPO | Attending: Psychiatry | Admitting: *Deleted

## 2014-01-11 VITALS — BP 145/95 | HR 107

## 2014-01-11 DIAGNOSIS — F332 Major depressive disorder, recurrent severe without psychotic features: Secondary | ICD-10-CM | POA: Diagnosis not present

## 2014-01-11 NOTE — Progress Notes (Signed)
Patient ID: Alexander Harrington, male   DOB: [birth year], 23 y.o.   MRN: [id number] Pt reported to [facility] for Repetitve Transcranial Magnetic Stimulation treatment for Major Depressive Disorder. Pt reported that he did not sleep well over the weekend. Pt stated that he got 5-6 hours of sleep each night of the weekend. Pt reported that his sleep quality is erratic. Pt tolerated tx well. %MT remained at 115% for the duration of tx, as this writer did not adjust the interval prior to beginning tx session. Interval must be increased in order for NeuroStar to allow %MT to be increased to 120%. Will increase interval prior to next tx in order to titrate %MT up to 120%.

## 2014-01-12 ENCOUNTER — Other Ambulatory Visit (HOSPITAL_COMMUNITY): Payer: BC Managed Care – PPO | Attending: Psychiatry | Admitting: *Deleted

## 2014-01-12 VITALS — BP 141/98 | HR 92

## 2014-01-12 DIAGNOSIS — F332 Major depressive disorder, recurrent severe without psychotic features: Secondary | ICD-10-CM | POA: Diagnosis not present

## 2014-01-12 NOTE — Progress Notes (Signed)
   THERAPIST PROGRESS NOTE  Session Time: 1:00-1:50  Participation Level: Active   Behavioral Response: CasualAlertDepressed   Type of Therapy: Individual Therapy   Treatment Goals addressed: emotion regulation, coping skills   Interventions: CBT   Summary: Yoshio Seliga is a 23 y.o. male who presents with severe depression.   Suicidal/Homicidal: Nowithout intent/plan    Therapist Response: Pt. Presents with primarily flat and dysphoric affect, lethargic. Pt. Does present with some brightening to his affect and more talkative since the last session. Pt. Reports that he has had four TMS treatments since his last session and that he believes that the sessions are going well except for a headache after the first session. Pt. Requested time to process recent interactions with a friend that Pt. Received as overly critical. Pt. Reports that the Pt. Was angered by his decision to spend his summer travel money on a PS4 and other issues that the Pt. Perceived as minor and not the friend's concern. Pt. Stated that he did not respond to his friend's last text. Pt. Was encouraged to identify the feeling i.e., anger, sadness, disappointment, confusion and find a time to communicate his feelings to his friend. Therapist checked in with the pt. About finding 5-10 minutes a day to dedicate to his writing. Pt. Reported that he had not had time to do this despite a limited work schedule of approximately 8 hours a week and attending his [place] sessions. Pt. Was encouraged to think about small behavioral changes that he can make such as free writing for 5 minutes and walking for 5 minutes from his front door and 5 minutes back to his house.  Plan: Continue with CBT based therapy. Return again in 2 weeks.   Diagnosis: Axis I: Depressive Disorder NOS   Axis II: No diagnosis     Renford Dills 01/12/2014

## 2014-01-12 NOTE — Progress Notes (Signed)
Patient ID: Alexander Harrington, male   DOB: [birth year], 23 y.o.   MRN: [id number] Pt reported to [facility] for Repetitve Transcranial Magnetic Stimulation treatment for Major Depressive Disorder. Pt reported that he drank 3 beers yesterday evening. Pt reported that he has been experiencing intermittent mild headaches some afternoons after tx, but he is not sure if they are associated with TMS tx, as he experienced intermittent headaches similar to these before beginning [place] tx. Pt tolerated tx well. Interval was increased by 2 seconds to 28 seconds prior to tx. This increased total tx time to 40 minutes. This was done so that %MT could be increased to 120%, per [employer] customer service. %MT titrated up to 120%, where it remained for the rest of tx. All future tx sessions will be conducted at 120% MT.

## 2014-01-13 ENCOUNTER — Other Ambulatory Visit (HOSPITAL_COMMUNITY): Payer: BC Managed Care – PPO | Attending: Psychiatry | Admitting: *Deleted

## 2014-01-13 VITALS — BP 133/85 | HR 100 | Wt 215.8 lb

## 2014-01-13 DIAGNOSIS — F332 Major depressive disorder, recurrent severe without psychotic features: Secondary | ICD-10-CM

## 2014-01-13 NOTE — Progress Notes (Signed)
Patient ID: Alexander Harrington, male   DOB: 02-Dec-1990, 23 y.o.   MRN: [id number] Pt reported to [facility] for Repetitve Transcranial Magnetic Stimulation treatment for Major Depressive Disorder. Pt complained of moderate discomfort, however wanted to continue at the current %MT of 120% when offered to adjust settings by Probation officer. Pt stated that he slept longer than usual this morning and had no change in medication and diet (caffeine intake). Pt completed the PHQ-9 with a score of 22. Pt tolerated tx at %MT 120% without issues.

## 2014-01-14 ENCOUNTER — Other Ambulatory Visit (HOSPITAL_COMMUNITY): Payer: Self-pay | Admitting: *Deleted

## 2014-01-15 ENCOUNTER — Other Ambulatory Visit (HOSPITAL_COMMUNITY): Payer: BC Managed Care – PPO | Attending: Psychiatry | Admitting: *Deleted

## 2014-01-15 VITALS — BP 123/83 | HR 88

## 2014-01-15 DIAGNOSIS — F332 Major depressive disorder, recurrent severe without psychotic features: Secondary | ICD-10-CM

## 2014-01-15 NOTE — Progress Notes (Signed)
Patient ID: Collen Vincent, male   DOB: [birth year], 23 y.o.   MRN: [id number] Pt reported to [facility] for Repetitve Transcranial Magnetic Stimulation treatment for Major Depressive Disorder. Pt complained of moderate discomfort, coil angle was adjusted +5 degrees for patient comfort. Pt did not attend treatment session on yesterday (6/25) due to not feeling well. Writer discussed the importance of consecutive visits for best treatment results. Pt stated that he did not sleep well last night and was experiencing back pain. After coil angle adjustment, Pt tolerated treatment well at %MT 120%.

## 2014-01-18 ENCOUNTER — Other Ambulatory Visit (HOSPITAL_COMMUNITY): Payer: BC Managed Care – PPO | Attending: Psychiatry | Admitting: *Deleted

## 2014-01-18 VITALS — BP 132/88 | HR 108

## 2014-01-18 DIAGNOSIS — F332 Major depressive disorder, recurrent severe without psychotic features: Secondary | ICD-10-CM | POA: Diagnosis not present

## 2014-01-18 NOTE — Progress Notes (Signed)
Patient ID: Alexander Harrington, male   DOB: 10-Jul-1991, 23 y.o.   MRN: [id number] Pt reported to [facility] for Repetitive Transcranial Magnetic Stimulation treatment for Major Depressive Disorder. Pt reports no changes from last tx and that he had a good, but quiet, weekend. Pt complained of some discomfort when the tx started, but after readjusting the coil, pt stated that it felt like it normally does. Pt states there is mild discomfort, but it is tolerable and pt was comfortable to continue tx. Pt tolerated tx well. %MT remained at 120% for the duration of tx.

## 2014-01-19 ENCOUNTER — Other Ambulatory Visit (HOSPITAL_COMMUNITY): Payer: BC Managed Care – PPO | Attending: Psychiatry | Admitting: *Deleted

## 2014-01-19 VITALS — BP 134/98 | HR 121

## 2014-01-19 DIAGNOSIS — F332 Major depressive disorder, recurrent severe without psychotic features: Secondary | ICD-10-CM

## 2014-01-19 NOTE — Progress Notes (Signed)
Patient ID: Alexander Harrington, male   DOB: May 31, 1991, 23 y.o.   MRN: [id number] Pt reported to [facility] for Repetitive Transcranial Magnetic Stimulation treatment for Major Depressive Disorder. Pt reports no changes from last tx, but pt is still not sleeping well. Pt reports some fluctuation in his mood, but he is seeing improvemnts Pt complained of no discomfort during the tx. Pt tolerated tx well. %MT remained at 120% for the duration of tx.

## 2014-01-20 ENCOUNTER — Other Ambulatory Visit (HOSPITAL_COMMUNITY): Payer: BC Managed Care – PPO | Attending: Psychiatry | Admitting: *Deleted

## 2014-01-20 VITALS — BP 135/95 | HR 100 | Ht 70.0 in | Wt 215.6 lb

## 2014-01-20 DIAGNOSIS — F332 Major depressive disorder, recurrent severe without psychotic features: Secondary | ICD-10-CM

## 2014-01-20 NOTE — Progress Notes (Addendum)
Patient ID: Alexander Harrington, male   DOB: Jul 19, 1991, 23 y.o.   MRN: [id number] Pt reported to [facility] for Repetitive Transcranial Magnetic Stimulation treatment for Major Depressive Disorder. Pt reports no changes from last tx, but is having trouble sleeping. Pt had no complaints of discomfort during the tx. Pt tolerated tx well. %MT remained at 120% for the duration of tx. Pt completed a PHQ-9 with a score of 17.

## 2014-01-21 ENCOUNTER — Other Ambulatory Visit (HOSPITAL_COMMUNITY): Payer: Self-pay | Admitting: *Deleted

## 2014-01-21 NOTE — Progress Notes (Signed)
Patient ID: Alexander Harrington, male   DOB: 02-28-1991, 23 y.o.   MRN: [id number] Pt did not report for treatment. Pt was contacted via phone call and voice message was left.

## 2014-01-22 ENCOUNTER — Other Ambulatory Visit (HOSPITAL_COMMUNITY): Payer: Self-pay

## 2014-01-25 ENCOUNTER — Other Ambulatory Visit (HOSPITAL_COMMUNITY): Payer: BC Managed Care – PPO | Attending: Psychiatry | Admitting: *Deleted

## 2014-01-25 VITALS — BP 133/93 | HR 103

## 2014-01-25 DIAGNOSIS — F332 Major depressive disorder, recurrent severe without psychotic features: Secondary | ICD-10-CM

## 2014-01-25 NOTE — Progress Notes (Signed)
Patient ID: Alexander Harrington, male   DOB: 01/20/1991, 23 y.o.   MRN: [id number] Pt reported to [facility] for Repetitive Transcranial Magnetic Stimulation treatment for Major Depressive Disorder. Pt reports that he missed his last tx session due to not feeling well and over-sleeping. Pt reports no major changes from last tx, but pt is still not sleeping well, espcially last night. Pt reports that he drank a little more alcohol this weekend for the holiday, but was social and interacted with friends. Pt reports that he drank a few beers last night. Pt complained of no discomfort during the tx. Pt tolerated tx well. %MT remained at 120% for the duration of tx.

## 2014-01-26 ENCOUNTER — Other Ambulatory Visit (HOSPITAL_COMMUNITY): Payer: BC Managed Care – PPO | Attending: Psychiatry | Admitting: *Deleted

## 2014-01-26 VITALS — BP 142/103 | HR 96

## 2014-01-26 DIAGNOSIS — F332 Major depressive disorder, recurrent severe without psychotic features: Secondary | ICD-10-CM | POA: Diagnosis not present

## 2014-01-26 NOTE — Progress Notes (Signed)
Patient ID: Alexander Harrington, male   DOB: 1990/12/05, 23 y.o.   MRN: [id number] Pt reported to [facility] for Repetitive Transcranial Magnetic Stimulation treatment for Major Depressive Disorder. Pt reports no major changes from last tx, but pt was able to sleep more yesterday. Pt reports sleeping during the day and going to bed at 9pm and sleeping through the night. Pt reports that he is not experiencing the headaches that he was at the beginning of tx. Pt complained of no discomfort during the tx. Pt tolerated tx well. %MT remained at 120% for the duration of tx.

## 2014-01-27 ENCOUNTER — Other Ambulatory Visit (HOSPITAL_COMMUNITY): Payer: BC Managed Care – PPO | Attending: Psychiatry | Admitting: *Deleted

## 2014-01-27 VITALS — BP 138/96 | HR 120 | Ht 70.0 in | Wt 215.4 lb

## 2014-01-27 DIAGNOSIS — F332 Major depressive disorder, recurrent severe without psychotic features: Secondary | ICD-10-CM

## 2014-01-27 NOTE — Progress Notes (Signed)
Patient ID: Alexander Harrington, male   DOB: 08-09-1990, 23 y.o.   MRN: [id number] Pt reported to [facility] for Repetitve Transcranial Magnetic Stimulation treatment for Major Depressive Disorder. Pt reported that his caffeine intake was dramatically increased yesterday 7/7. Pt reported that he drank 1 12 oz. Red Bull energy drink, along with "a lot of soda and some coffee." Pt reported that he consumed 1 cup of coffee this morning prior to tx, as well. Pt reported feeling jittery. Pt requested to complete the weekly PHQ-9 depression inventory tomorrow, as opposed to today (PHQ-9s usually completed on Wednesdays). Pt reported that he feels that his score would not be accurate today, as he is "riding a high" due to recent developments in a relationship with a girl he likes. Pt tolerated tx well. %MT remained at 120% for the duration of tx.

## 2014-01-28 ENCOUNTER — Other Ambulatory Visit (HOSPITAL_COMMUNITY): Payer: BC Managed Care – PPO | Attending: Psychiatry | Admitting: *Deleted

## 2014-01-28 VITALS — BP 146/80 | HR 103

## 2014-01-28 DIAGNOSIS — F332 Major depressive disorder, recurrent severe without psychotic features: Secondary | ICD-10-CM | POA: Diagnosis not present

## 2014-01-28 NOTE — Progress Notes (Signed)
Patient ID: Alexander Harrington, male   DOB: 19-Nov-1990, 23 y.o.   MRN: [id number] Pt reported to [facility] for Repetitve Transcranial Magnetic Stimulation treatment for Major Depressive Disorder. Pt completed a PHQ-9, scoring a 17 (moderately severe depression). This is identical to his score from the previous week, but reduced from his initial score of 23. Pt is making progress re: his depression. Pt tolerated tx well. %MT remained at 120% for the duration of tx.

## 2014-01-29 ENCOUNTER — Other Ambulatory Visit (HOSPITAL_COMMUNITY): Payer: Self-pay

## 2014-02-01 ENCOUNTER — Other Ambulatory Visit (HOSPITAL_COMMUNITY): Payer: BC Managed Care – PPO | Attending: Psychiatry | Admitting: *Deleted

## 2014-02-01 VITALS — BP 146/96 | HR 90

## 2014-02-01 DIAGNOSIS — F332 Major depressive disorder, recurrent severe without psychotic features: Secondary | ICD-10-CM

## 2014-02-01 NOTE — Progress Notes (Signed)
Patient ID: Alexander Harrington, male   DOB: 03-29-1991, 22 y.o.   MRN: [id number] Pt reported to [facility] for Repetitve Transcranial Magnetic Stimulation treatment for Major Depressive Disorder. Pt missed previous tx scheduled for 01/29/14. Pt stated that he was unable to sleep at all the night before the missed session, and finally fell asleep that morning, so he slept through his appointment time. Pt endorsed high levels of anxiety this morning, to which he attributed his elevated BP and pulse. Pt had a first date with a person he is interested in on 01/31/14, and he stated he is "overthinking" the next step in the relationship, which causes him anxiety. Pt tolerated tx well. %MT remained at 120% for the duration of tx.

## 2014-02-02 ENCOUNTER — Other Ambulatory Visit (HOSPITAL_COMMUNITY): Payer: BC Managed Care – PPO | Attending: Psychiatry | Admitting: *Deleted

## 2014-02-02 VITALS — BP 132/90 | HR 108

## 2014-02-02 DIAGNOSIS — F332 Major depressive disorder, recurrent severe without psychotic features: Secondary | ICD-10-CM | POA: Diagnosis not present

## 2014-02-02 NOTE — Progress Notes (Signed)
Patient ID: Alexander Harrington, male   DOB: 1990-10-31, 23 y.o.   MRN: [id number] Pt reported to [facility] for Repetitve Transcranial Magnetic Stimulation treatment for Major Depressive Disorder. Pt reported continued anxiety re: a potential romantic relationship. Pt's affect more flat than previous day. Pt tolerated tx well. %MT remained at 120% for the duration of tx.

## 2014-02-03 ENCOUNTER — Other Ambulatory Visit (HOSPITAL_COMMUNITY): Payer: Self-pay

## 2014-02-04 ENCOUNTER — Other Ambulatory Visit (HOSPITAL_COMMUNITY): Payer: BC Managed Care – PPO | Attending: Psychiatry | Admitting: *Deleted

## 2014-02-04 ENCOUNTER — Ambulatory Visit (INDEPENDENT_AMBULATORY_CARE_PROVIDER_SITE_OTHER): Payer: BC Managed Care – PPO | Admitting: Psychiatry

## 2014-02-04 VITALS — BP 130/94 | HR 116 | Ht 70.0 in

## 2014-02-04 DIAGNOSIS — F332 Major depressive disorder, recurrent severe without psychotic features: Secondary | ICD-10-CM

## 2014-02-04 NOTE — Progress Notes (Signed)
Patient ID: Alexander Harrington, male   DOB: 30-Nov-1990, 23 y.o.   MRN: [id number] Pt reported to [facility] for Repetitve Transcranial Magnetic Stimulation treatment for Major Depressive Disorder. Pt reported that he feels that he is making some progress re: his depression with [place] tx. Pt stated "I have been going through some stuff recently, and had all this happened 2 months ago, I would not have handled it as well." Pt completed a PHQ-9 with a score of 15. Pt tolerated tx well. During tx, pt stated that his body position was becoming uncomfortable as "I have been having some problems with my back." Coil removed from pt's head, pt repositioned body, pt's head realigned, coil replaced, and tx resumed. Pt reported that his comfort level was tolerable after adjusting his position. %MT remained at 120% for the duration of tx.

## 2014-02-04 NOTE — Progress Notes (Signed)
   THERAPIST PROGRESS NOTE  Session Time: 1:00-1:50   Participation Level: Active   Behavioral Response: CasualAlertDysphoric  Type of Therapy: Individual Therapy   Treatment Goals addressed: emotion regulation, coping skills   Interventions: CBT   Summary: Alexander Harrington is a 23 y.o. male who presents with severe depression.   Suicidal/Homicidal: Nowithout intent/plan   Therapist Response: Pt. Presents with significant improvement in affect since our last session. Pt. Continues to present with moderate flat affect, but smiles and laughs appropriately and makes appropriate eye contact. Pt. Reports that his [place] sessions are "going". Pt. Reports that he continues to interact in friend group and is working at his job on campus. Pt. Reports that he met a girl online and asked her out on a date. Pt. Not sure about the current status of the relationship but encouraged by his ability to initiate the relationship. Pt. Reports that it is difficult for him to determine if the [place] is working but he reports improvement in his ability to manage disappointments and frustrations without become very depressed. Session focused on Pt.'s tendency to become narrowly focused in relationships and how he might broaden his attention.  Plan: Return again in 3 weeks.   Diagnosis: Axis I: Depressive Disorder NOS   Axis II: No diagnosis     Renford Dills 02/04/2014

## 2014-02-05 ENCOUNTER — Other Ambulatory Visit (HOSPITAL_COMMUNITY): Payer: BC Managed Care – PPO | Attending: Psychiatry | Admitting: *Deleted

## 2014-02-05 VITALS — BP 145/94 | HR 125

## 2014-02-05 DIAGNOSIS — F332 Major depressive disorder, recurrent severe without psychotic features: Secondary | ICD-10-CM | POA: Diagnosis not present

## 2014-02-05 NOTE — Progress Notes (Signed)
Patient ID: Alexander Harrington, male   DOB: Sep 16, 1990, 23 y.o.   MRN: [id number] Pt reported to [facility] for Repetitve Transcranial Magnetic Stimulation treatment for Major Depressive Disorder. In response to the pt's elevated vital signs, writer asked pt if he felt anxious. Pt endorsed anxiety, and stated that he woke up this morning feeling anxious, which occurs frequently. Pt was unable to identify a trigger for the anxiety he has been experiencing this morning. Pt later reported that he has had no contact with someone in whom he is romantically interested in several days, and this may be contributing to his anxiety. Pt tolerated tx well. During treatment, pt complained of discomfort in his neck. Coil removed from head, pt allowed to stretch, head realigned, coil replaced, and tx resumed. Pt's discomfort was tolerable after this intervention. %MT remained at 120% for the duration of tx.

## 2014-02-08 ENCOUNTER — Other Ambulatory Visit (HOSPITAL_COMMUNITY): Payer: BC Managed Care – PPO | Attending: Psychiatry | Admitting: *Deleted

## 2014-02-08 VITALS — BP 129/88 | HR 120

## 2014-02-08 DIAGNOSIS — F332 Major depressive disorder, recurrent severe without psychotic features: Secondary | ICD-10-CM | POA: Diagnosis not present

## 2014-02-08 NOTE — Progress Notes (Signed)
Patient ID: Alexander Harrington, male   DOB: 1991/07/14, 23 y.o.   MRN: [id number] Pt reported to [facility] for Repetitve Transcranial Magnetic Stimulation treatment for Major Depressive Disorder. Pt presented with flat affect. Writer facilitated psychoeducation on deep breathing techniques priors to checking pt's vitals today, as the pt's BP has been consistently high, and the pt frequently endorses anxiety. Pt more conversational during tx session today. Pt tolerated tx well. %MT remained at 120% for the duration of tx.

## 2014-02-09 ENCOUNTER — Other Ambulatory Visit (HOSPITAL_COMMUNITY): Payer: BC Managed Care – PPO | Attending: Psychiatry | Admitting: *Deleted

## 2014-02-09 VITALS — BP 129/86 | HR 125

## 2014-02-09 DIAGNOSIS — F332 Major depressive disorder, recurrent severe without psychotic features: Secondary | ICD-10-CM

## 2014-02-09 NOTE — Progress Notes (Signed)
Patient ID: Alexander Harrington, male   DOB: 1990/12/16, 23 y.o.   MRN: [id number] Pt reported to [facility] for Repetitve Transcranial Magnetic Stimulation treatment for Major Depressive Disorder. Pt reported that he felt stressed from being late to this appointment. Writer facilitated relaxation/deep breathing exercise with pt to reduce stress. Pt's blood pressure in normal range. Pt tolerated tx well. Pt was talkative during tx, holding an active conversation with this Probation officer for the duration of the session. %MT remained at 120% for the duration of tx.

## 2014-02-10 ENCOUNTER — Other Ambulatory Visit (HOSPITAL_COMMUNITY): Payer: BC Managed Care – PPO | Attending: Psychiatry | Admitting: *Deleted

## 2014-02-10 VITALS — BP 142/85 | HR 130 | Ht 70.0 in | Wt 216.6 lb

## 2014-02-10 DIAGNOSIS — F332 Major depressive disorder, recurrent severe without psychotic features: Secondary | ICD-10-CM | POA: Diagnosis not present

## 2014-02-10 NOTE — Progress Notes (Signed)
Patient ID: Alexander Harrington, male   DOB: 15-May-1991, 23 y.o.   MRN: [id number] Pt reported to [facility] for Repetitve Transcranial Magnetic Stimulation treatment for Major Depressive Disorder. Pt completed a PHQ-9 with a score of 17 (moderately severe depression). Pt also completed a Beck's Depression Inventory with a score of 35 (severe depression). Pt's current Beck's score is decreased from his initial score of 41 (extreme depression) on 01/05/14. The pt's PHQ-9 is slightly increased from the previous week's score of 15, but his depression seems to be improving overall. Pt stated that he has been feeling more depressed the past 2 days, but not as depressed as he felt before beginning [place] tx. Pt's overall progress warrants continued [place] tx. Pt tolerated tx well. %MT remained at 120% for the duration of tx.

## 2014-02-11 ENCOUNTER — Other Ambulatory Visit (HOSPITAL_COMMUNITY): Payer: BC Managed Care – PPO | Attending: Psychiatry | Admitting: *Deleted

## 2014-02-11 VITALS — BP 136/88 | HR 130

## 2014-02-11 DIAGNOSIS — F332 Major depressive disorder, recurrent severe without psychotic features: Secondary | ICD-10-CM

## 2014-02-11 NOTE — Progress Notes (Signed)
Patient ID: Alexander Harrington, male   DOB: 1990-10-21, 23 y.o.   MRN: [id number] Pt reported to [facility] for Repetitve Transcranial Magnetic Stimulation treatment for Major Depressive Disorder. Pt reported that his anxiety remains high, and he cannot identify a trigger for it. Pt states "nothing really causes it. It feels more generalized." Pt tolerated tx well. %MT remained at 120% for the duration of tx.

## 2014-02-12 ENCOUNTER — Other Ambulatory Visit (HOSPITAL_COMMUNITY): Payer: BC Managed Care – PPO | Attending: Psychiatry | Admitting: *Deleted

## 2014-02-12 VITALS — BP 135/85 | HR 127

## 2014-02-12 DIAGNOSIS — F332 Major depressive disorder, recurrent severe without psychotic features: Secondary | ICD-10-CM

## 2014-02-12 NOTE — Progress Notes (Signed)
Patient ID: Alexander Harrington, male   DOB: 1991-03-15, 23 y.o.   MRN: [id number] Pt reported to [facility] for Repetitve Transcranial Magnetic Stimulation treatment for Major Depressive Disorder. Pt presented with brighter affect than normal today. Pt very conversive during tx, holding a conversation with this Probation officer for the duration of tx. Pt also laughing frequently at appropriate times. Pt tolerated tx well. %MT remained at 120% for the duration of tx.

## 2014-02-15 ENCOUNTER — Other Ambulatory Visit (HOSPITAL_COMMUNITY): Payer: BC Managed Care – PPO | Attending: Psychiatry | Admitting: *Deleted

## 2014-02-15 VITALS — BP 143/95 | HR 127

## 2014-02-15 DIAGNOSIS — F332 Major depressive disorder, recurrent severe without psychotic features: Secondary | ICD-10-CM | POA: Diagnosis not present

## 2014-02-15 NOTE — Progress Notes (Signed)
Patient ID: Alexander Harrington, male   DOB: 1990-07-31, 23 y.o.   MRN: [id number] Pt reported to [facility] for Repetitve Transcranial Magnetic Stimulation treatment for Major Depressive Disorder. Pt reported that he experienced an increased level of depressive symptoms over the weekend; including ruminative thoughts, isolation, decreased motivation, fatigue, and increased sleep. Pt reported "I feel a little better today, but still not great." Pt tolerated tx well. %MT remained at 120% for the duration of tx.

## 2014-02-16 ENCOUNTER — Other Ambulatory Visit (HOSPITAL_COMMUNITY): Payer: BC Managed Care – PPO | Attending: Psychiatry | Admitting: *Deleted

## 2014-02-16 VITALS — BP 144/96 | HR 86

## 2014-02-16 DIAGNOSIS — F332 Major depressive disorder, recurrent severe without psychotic features: Secondary | ICD-10-CM

## 2014-02-16 NOTE — Progress Notes (Signed)
Patient ID: Alexander Harrington, male   DOB: 30-Jan-1991, 24 y.o.   MRN: [id number] Pt reported to [facility] for Repetitve Transcranial Magnetic Stimulation treatment for Major Depressive Disorder. Pt reported that he felt very fatigued. Pt reported that he got approximately 5-6 hours of sleep last night from about 3:00am -- 9:00am. Pt was unable to identify a reason for his fatigue. Pt presented with flat affect. Pt tolerated tx well. %MT remained at 120% for the duration of tx. Pt had no complaints after tx.

## 2014-02-17 ENCOUNTER — Other Ambulatory Visit (HOSPITAL_COMMUNITY): Payer: BC Managed Care – PPO | Attending: Psychiatry | Admitting: *Deleted

## 2014-02-17 VITALS — BP 136/90 | HR 113 | Ht 70.0 in | Wt 222.2 lb

## 2014-02-17 DIAGNOSIS — F332 Major depressive disorder, recurrent severe without psychotic features: Secondary | ICD-10-CM

## 2014-02-17 NOTE — Progress Notes (Signed)
Patient ID: Alexander Harrington, male   DOB: [birth year], 24 y.o.   MRN: [id number] Pt reported to [facility] for Repetitve Transcranial Magnetic Stimulation treatment for Major Depressive Disorder. Pt reported that he consumed 4 beers last night, with the last beer consumed around 11:00pm. Pt reported no headache or elevated levels of fatigue this morning. Pt completed a PHQ-9 with a score of 19. This is increased from the previous week's score of 17. Pt had difficulty identifying a trigger for the increase in his depressive symptoms. Pt reported that he has been feeling more depressed, and he feels guilty about this as he believes that he should be feeling better. Pt also reported that he has not had much contact with his best friend recently, and this has taken a toll on his mood. Pt tolerated tx well. %MT remained at 120% for the duration of tx. Pt with no complaints post-tx.

## 2014-02-18 ENCOUNTER — Other Ambulatory Visit (HOSPITAL_COMMUNITY): Payer: BC Managed Care – PPO | Attending: Psychiatry | Admitting: *Deleted

## 2014-02-18 VITALS — BP 143/92 | HR 111

## 2014-02-18 DIAGNOSIS — F332 Major depressive disorder, recurrent severe without psychotic features: Secondary | ICD-10-CM

## 2014-02-18 NOTE — Progress Notes (Signed)
Patient ID: Alexander Harrington, male   DOB: 16-Jul-1991, 23 y.o.   MRN: [id number] Pt reported to [facility] for Repetitve Transcranial Magnetic Stimulation treatment for Major Depressive Disorder.  Pt not looking forward to moving his belongings out of his apartment later today. Despite this, pt conversive and affect brighter than normal throughout tx session. Pt engaged in appropriate humor with writer, laughing several times during tx. Pt reported no changes in medication regimen, sleep, appetite, caffeine/alcohol/drug consumption, or metal implant status since previous tx sesison. Pt tolerated tx well. %MT remained at 120% for the duration of tx. Pt with no complaints post-tx.

## 2014-02-19 ENCOUNTER — Other Ambulatory Visit (HOSPITAL_COMMUNITY): Payer: BC Managed Care – PPO | Attending: Psychiatry | Admitting: *Deleted

## 2014-02-19 VITALS — BP 129/97 | HR 109

## 2014-02-19 DIAGNOSIS — F332 Major depressive disorder, recurrent severe without psychotic features: Secondary | ICD-10-CM

## 2014-02-19 NOTE — Progress Notes (Signed)
Patient ID: Alexander Harrington, male   DOB: Aug 12, 1990, 23 y.o.   MRN: [id number] Pt reported to [facility] for Repetitve Transcranial Magnetic Stimulation treatment for Major Depressive Disorder. Pt presented with lethargic and flat affect. Upon placing BP cuff on pt's arm, writer smelled what he thought was alcohol on pt's breath. Writer investigated, asking pt if he had been drinking alcohol. Pt denied that he had. MD notified of situation. MD instructed writer to proceed with tx based on pt's report that he had not been drinking alcohol. Pt reported no changes since the previous tx re: appetite, food/caffeince/alcohol/drug consumption, sleep, metal implants, etc. Pt tolerated tx well. %MT remained at 120% for the duration of tx. Pt with no complaints post-tx.

## 2014-02-23 ENCOUNTER — Other Ambulatory Visit (HOSPITAL_COMMUNITY): Payer: BC Managed Care – PPO | Attending: Psychiatry | Admitting: *Deleted

## 2014-02-23 VITALS — BP 128/88 | HR 115

## 2014-02-23 DIAGNOSIS — F332 Major depressive disorder, recurrent severe without psychotic features: Secondary | ICD-10-CM | POA: Diagnosis not present

## 2014-02-23 NOTE — Progress Notes (Signed)
Patient ID: Alexander Harrington, male   DOB: [birth year], 23 y.o.   MRN: [id number] Pt reported to [facility] for Repetitve Transcranial Magnetic Stimulation treatment for Major Depressive Disorder. Pt stated that he was unable to come in for tx the previous day bc he was not able to sleep the night before. Pt endorsed racing thoughts as the cause for this. Pt reported that he was able to sleep well last night, sleeping for approximately 7 hours. Pt reported no change in medication regimen, appetite/inakte, alcohol/substance use, or caffeine consumption since his previous tx session. Pt tolerated tx well. %MT remained at 120% for the duration of tx. Pt with no complaints post-tx, and he departed from the clinic without issue.

## 2014-02-24 ENCOUNTER — Other Ambulatory Visit (HOSPITAL_COMMUNITY): Payer: BC Managed Care – PPO | Attending: Psychiatry | Admitting: *Deleted

## 2014-02-24 VITALS — BP 130/84 | HR 93 | Ht 70.0 in | Wt 220.0 lb

## 2014-02-24 DIAGNOSIS — F332 Major depressive disorder, recurrent severe without psychotic features: Secondary | ICD-10-CM

## 2014-02-24 NOTE — Progress Notes (Signed)
Patient ID: Alexander Harrington, male   DOB: May 03, 1991, 23 y.o.   MRN: [id number] Pt reported to [facility] for Repetitve Transcranial Magnetic Stimulation treatment for Major Depressive Disorder. Pt reported that there were no changes in his medication regimen, appetite/intake, caffeine consumption, substance use, or metal implant status since the previous tx session. Pt stated that he did not sleep well last night, getting only about 5 hours of sleep total. Pt also reported that he drank 1 beer last night. Pt completed a PHQ-9 with a score of 18 (Moderately severe depression). 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 Pt tolerated tx well. %MT remained at 120% for the duration of tx. Pt with no complaints post-tx. Pt departed from clinic without issue.

## 2014-02-26 ENCOUNTER — Other Ambulatory Visit (HOSPITAL_COMMUNITY): Payer: BC Managed Care – PPO | Attending: Psychiatry

## 2014-03-01 ENCOUNTER — Other Ambulatory Visit (HOSPITAL_COMMUNITY): Payer: BC Managed Care – PPO | Attending: Psychiatry | Admitting: *Deleted

## 2014-03-01 VITALS — BP 126/84 | HR 108

## 2014-03-01 DIAGNOSIS — F332 Major depressive disorder, recurrent severe without psychotic features: Secondary | ICD-10-CM

## 2014-03-01 NOTE — Progress Notes (Signed)
Patient ID: Alexander Harrington, male   DOB: 05-19-1991, 23 y.o.   MRN: [id number] Pt reported to [facility] for Repetitve Transcranial Magnetic Stimulation treatment for Major Depressive Disorder. Pt reported that he did not sleep well over the weekend, so he took 3 mg of Klonopin last night rather than 2 mg in an effort to sleep better. Pt still did not sleep well -- fell asleep at 4:00am and got 3-4 hours of sleep. Pt tolerated tx well. %MT remained at 120% for the duration of tx. Pt with no complaints post-tx. Pt departed from clinic without issue.

## 2014-03-02 ENCOUNTER — Other Ambulatory Visit (HOSPITAL_COMMUNITY): Payer: BC Managed Care – PPO | Attending: Psychiatry | Admitting: *Deleted

## 2014-03-02 VITALS — BP 128/80 | HR 108

## 2014-03-02 DIAGNOSIS — F332 Major depressive disorder, recurrent severe without psychotic features: Secondary | ICD-10-CM | POA: Diagnosis not present

## 2014-03-02 NOTE — Progress Notes (Signed)
Patient ID: Alexander Harrington, male   DOB: [birth year], 23 y.o.   MRN: [id number] Pt reported to [facility] for Repetitve Transcranial Magnetic Stimulation treatment for Major Depressive Disorder. Pt reported that he slept somewhat better the previous night, getting a total of about 6 hours of sleep. Pt reported no change in his appetite/intake, alcohol/substance use, medication regimen, or metal implant status. Pt tolerated tx well. %MT remained at 120% for the duration of tx. Pt with no complaints post-tx. Pt departed from clinic without issue.

## 2014-03-03 ENCOUNTER — Other Ambulatory Visit (HOSPITAL_COMMUNITY): Payer: BC Managed Care – PPO | Attending: Psychiatry | Admitting: *Deleted

## 2014-03-03 VITALS — BP 130/76 | HR 108 | Ht 70.0 in | Wt 221.2 lb

## 2014-03-03 DIAGNOSIS — F332 Major depressive disorder, recurrent severe without psychotic features: Secondary | ICD-10-CM | POA: Diagnosis not present

## 2014-03-03 NOTE — Progress Notes (Signed)
Patient ID: Alexander Harrington, male   DOB: 09-Jan-1991, 23 y.o.   MRN: [id number] Pt reported to [facility] for Repetitve Transcranial Magnetic Stimulation treatment for Major Depressive Disorder. Pt reported that he slept better last night, getting about 8 hours of sleep in total. Pt still feels tired today. Pt presents with flat, lethargic affect. Pt reported no change in medication regimen, intake/appetite, alcohol.substnace use, caffeine consumption, or metal implant status since previous tx. Pt completed a PHQ-9 with a score of 17 (moderately severe depression. Pt reported he has been experiencing life stressors which may be impeding his progress; including increased isolation/feeling alone, as well as anxiety re: starting the school year, which begins next week. Pt tolerated tx well. %MT remained at 120% for the duration of tx. Pt with no complaints post-tx. Pt departed from clinic without issue.

## 2014-03-05 ENCOUNTER — Other Ambulatory Visit (HOSPITAL_COMMUNITY): Payer: BC Managed Care – PPO

## 2014-03-08 ENCOUNTER — Other Ambulatory Visit (HOSPITAL_COMMUNITY): Payer: BC Managed Care – PPO

## 2014-03-09 ENCOUNTER — Other Ambulatory Visit (HOSPITAL_COMMUNITY): Payer: BC Managed Care – PPO | Attending: Psychiatry | Admitting: *Deleted

## 2014-03-09 VITALS — BP 134/94 | HR 98

## 2014-03-09 DIAGNOSIS — F332 Major depressive disorder, recurrent severe without psychotic features: Secondary | ICD-10-CM

## 2014-03-09 NOTE — Progress Notes (Signed)
Patient ID: Alexander Harrington, male   DOB: 1991/05/25, 23 y.o.   MRN: [id number] Pt reported to [facility] for Repetitve Transcranial Magnetic Stimulation treatment for Major Depressive Disorder. Pt reported no change in medication regimen, alcohol/substance use, caffeine consumption, sleep pattern, or metal implant status since previous tx. Pt began class at [employer] yesterday 03/08/14. Pt stated he is still unsure if he wants to take classes this semester, as he is not sure that he can complete the semester, but he feels stuck, so he hasn't made a decision. Pt tolerated tx well. %MT remained at 120% for the duration of tx. Pt with no complaints post-tx. Writer requested that pt complete a PHQ-9 after tx, but pt requested to complete it after his next tx session scheduled for 03/11/14. Pt departed from clinic without issue.

## 2014-03-11 ENCOUNTER — Other Ambulatory Visit (INDEPENDENT_AMBULATORY_CARE_PROVIDER_SITE_OTHER): Payer: BC Managed Care – PPO | Admitting: *Deleted

## 2014-03-11 VITALS — BP 126/80 | HR 104 | Ht 70.0 in | Wt 223.0 lb

## 2014-03-11 DIAGNOSIS — F332 Major depressive disorder, recurrent severe without psychotic features: Secondary | ICD-10-CM

## 2014-03-11 NOTE — Progress Notes (Signed)
Patient ID: Alexander Harrington, male   DOB: January 27, 1991, 23 y.o.   MRN: [id number] Pt reported to [facility] for Repetitve Transcranial Magnetic Stimulation treatment for Major Depressive Disorder. Pt reported no change in medication regimen, alcohol/substance use, or metal implant status since previous tx. Pt presented with lethargic affect. Pt reported "I probably slept too much last night." Pt stated he went to sleep around 10:00pm last night and didn't get up until approximately 10:00am this morning. Pt stated he woke up often during the night. Pt reported that he did not sleep well the night of 03/09/14, which caused him to be tired yesterday, which led him to oversleep last night. Pt reported minor fluctuations in his caffeine consumption, as well, in that he drank a cup of coffee yesterday morning and a caffeinated soft drink yesterday afternoon, but did not consume any caffeine so far today. Pt completed a PHQ-9 with a score of 18 (moderately severe depression). This is a slight increase from the previous week's score of 17. Pt attributes the continuation of this level of depression to current stressors in his life at this time, including feeling as though he should not attend college this semester due to "needing to get in a better place," as well as familial conflicts that arise from this as his parents want him to stay in school. Pt stated that he has to make up his mind for good today, as tuition is due tomorrow, and he has to pay if he is going to stay in school. Pt states he does not know what to do, and obviously feels a lot of stress and pressure as a result of this decision. Will continue with tx to promote antidepressive benefit. Pt tolerated tx well. %MT remained at 120% for the duration of tx. Pt with no complaints post-tx. Pt departed from clinic without issue.

## 2014-03-12 ENCOUNTER — Other Ambulatory Visit (HOSPITAL_COMMUNITY): Payer: BC Managed Care – PPO

## 2014-03-12 ENCOUNTER — Ambulatory Visit (HOSPITAL_COMMUNITY): Payer: Self-pay | Admitting: Psychiatry

## 2014-03-16 ENCOUNTER — Other Ambulatory Visit (HOSPITAL_COMMUNITY): Payer: BC Managed Care – PPO

## 2014-03-22 ENCOUNTER — Encounter (HOSPITAL_COMMUNITY): Payer: Self-pay

## 2014-03-22 ENCOUNTER — Telehealth (HOSPITAL_COMMUNITY): Payer: Self-pay | Admitting: *Deleted

## 2014-03-22 NOTE — Telephone Encounter (Signed)
Pt called to schedule final TMS tx. Pt's final tx session was scheduled for the previous week, but pt no showed and staff was unable to reach him via telephone. Pt stated on this call that he had been sick, so he wasn't able to make it in for his final session. Final TMS tx session scheduled for 03/22/14 4:00pm.

## 2014-03-23 ENCOUNTER — Other Ambulatory Visit (HOSPITAL_COMMUNITY): Payer: BC Managed Care – PPO | Attending: Psychiatry | Admitting: *Deleted

## 2014-03-23 VITALS — BP 126/78 | HR 116 | Ht 70.0 in | Wt 222.8 lb

## 2014-03-23 DIAGNOSIS — F332 Major depressive disorder, recurrent severe without psychotic features: Secondary | ICD-10-CM

## 2014-03-23 NOTE — Progress Notes (Signed)
Patient ID: Alexander Harrington, male   DOB: 1990-11-01, 23 y.o.   MRN: [id number] Pt reported to [facility] for Repetitve Transcranial Magnetic Stimulation treatment for Major Depressive Disorder. This is the final tx session in the pt's course of [place] tx. Pt completed a PHQ-9 with a score of 16 (moderately severe depression). This is reduced from his previous score of 18 (moderately severe depression) taken on 03/11/14, as well as his baseline score of 23 (severe depression) taken on 01/05/14. Pt also completed a Beck's Depression Inventory with a score of 28 (moderate depression). This is reduced from his previous score of 35 (severe depression) taken on 02/10/14, as well as his baseline score of 41(extreme depression) taken on 01/05/14. Pt reported no change in medication regimen, alcohol/substance use, caffeine consumption, sleep pattern, or metal implant status since previous tx. Pt reported he is currently feeling motivated to make behavioral changes in his life to continue to improve re: his depression, such as exercising regularly, writing, and managing his time better. Pt is currently in contemplation re: what changes to make re: all of these. Pt tolerated tx well. %MT remained at 120% for the duration of tx. Pt with no complaints post-tx. Pt departed from clinic without issue. Will schedule follow up with [place] NP to debrief concerning pt's experience with [place].

## 2014-03-24 ENCOUNTER — Ambulatory Visit (HOSPITAL_COMMUNITY): Payer: Self-pay | Admitting: Psychiatry

## 2014-04-14 ENCOUNTER — Encounter (HOSPITAL_COMMUNITY): Payer: Self-pay | Admitting: Psychiatry

## 2014-04-14 ENCOUNTER — Ambulatory Visit (INDEPENDENT_AMBULATORY_CARE_PROVIDER_SITE_OTHER): Payer: BC Managed Care – PPO | Admitting: Psychiatry

## 2014-04-14 VITALS — BP 134/94 | HR 108 | Ht 70.0 in | Wt 222.8 lb

## 2014-04-14 DIAGNOSIS — F331 Major depressive disorder, recurrent, moderate: Secondary | ICD-10-CM

## 2014-04-14 DIAGNOSIS — F321 Major depressive disorder, single episode, moderate: Secondary | ICD-10-CM

## 2014-04-14 NOTE — Progress Notes (Signed)
   [place] Follow-up Outpatient Visit  Alexander Harrington 08-27-1990  Date:  04/14/14 Subjective: Pt is here for follow up Doing much better. He is less anxious and depressed. He responded to the [place] treated. Initially, he had a PHQ 9 and Beck's of 41. On 02/05/15- PHQ 9 was 15, latest one is PHQ 9 is 15, and Becks' is 2, and today, PHQ 9 is 11, and Beck's of 22. He is doing well. He denies SI/HI/AVH.   There were no vitals filed for this visit.  Mental Status Examination  Appearance: casual  Alert: Yes Attention: fair  Cooperative: Yes Eye Contact: Fair Speech: wnl  Psychomotor Activity: Psychomotor Retardation Memory/Concentration: fair  Oriented: time/date, situation and day of week Mood: Dysphoric Affect: Constricted Thought Processes and Associations: Linear Fund of Knowledge: Fair Thought Content: preoccupations Insight: Fair Judgement: Fair  Diagnosis:  MDD, recurrent, moderate  Treatment Plan:  Brintellix 20 mg po for depression Dexedrine 15 mg po 24 hours  Abilify 20 mg for mood stabilization  Madison Hickman, NP

## 2014-07-20 ENCOUNTER — Ambulatory Visit (INDEPENDENT_AMBULATORY_CARE_PROVIDER_SITE_OTHER): Payer: BC Managed Care – PPO | Admitting: Internal Medicine

## 2014-07-20 ENCOUNTER — Encounter: Payer: Self-pay | Admitting: Internal Medicine

## 2014-07-20 VITALS — BP 108/76 | HR 102 | Temp 97.8°F | Resp 18 | Ht 70.0 in | Wt 224.0 lb

## 2014-07-20 DIAGNOSIS — J209 Acute bronchitis, unspecified: Secondary | ICD-10-CM | POA: Insufficient documentation

## 2014-07-20 MED ORDER — AZITHROMYCIN 250 MG PO TABS
ORAL_TABLET | ORAL | Status: DC
Start: 2014-07-20 — End: 2014-11-09

## 2014-07-20 MED ORDER — FLUTICASONE PROPIONATE 50 MCG/ACT NA SUSP: 2.0000 | Freq: Every day | NASAL | Status: DC

## 2014-07-20 NOTE — Progress Notes (Signed)
Pre visit review using our clinic review tool, if applicable. No additional management support is needed unless otherwise documented below in the visit note. 

## 2014-07-20 NOTE — Patient Instructions (Signed)
We have sent in an antibiotic called azithromycin. Take 2 pills today, then starting tomorrow take 1 pill a day until it's gone.   The other medicine we have sent in is called flonase and 2 puffs in each nostril daily to help the congestion dry up. It may take several weeks for the cough to disappear entirely.   Acute Bronchitis Bronchitis is inflammation of the airways that extend from the windpipe into the lungs (bronchi). The inflammation often causes mucus to develop. This leads to a cough, which is the most common symptom of bronchitis.  In acute bronchitis, the condition usually develops suddenly and goes away over time, usually in a couple weeks. Smoking, allergies, and asthma can make bronchitis worse. Repeated episodes of bronchitis may cause further lung problems.  CAUSES Acute bronchitis is most often caused by the same virus that causes a cold. The virus can spread from person to person (contagious) through coughing, sneezing, and touching contaminated objects. SIGNS AND SYMPTOMS   Cough.   Fever.   Coughing up mucus.   Body aches.   Chest congestion.   Chills.   Shortness of breath.   Sore throat.  DIAGNOSIS  Acute bronchitis is usually diagnosed through a physical exam. Your health care provider will also ask you questions about your medical history. Tests, such as chest X-rays, are sometimes done to rule out other conditions.  TREATMENT  Acute bronchitis usually goes away in a couple weeks. Oftentimes, no medical treatment is necessary. Medicines are sometimes given for relief of fever or cough. Antibiotic medicines are usually not needed but may be prescribed in certain situations. In some cases, an inhaler may be recommended to help reduce shortness of breath and control the cough. A cool mist vaporizer may also be used to help thin bronchial secretions and make it easier to clear the chest.  HOME CARE INSTRUCTIONS  Get plenty of rest.   Drink enough fluids  to keep your urine clear or pale yellow (unless you have a medical condition that requires fluid restriction). Increasing fluids may help thin your respiratory secretions (sputum) and reduce chest congestion, and it will prevent dehydration.   Take medicines only as directed by your health care provider.  If you were prescribed an antibiotic medicine, finish it all even if you start to feel better.  Avoid smoking and secondhand smoke. Exposure to cigarette smoke or irritating chemicals will make bronchitis worse. If you are a smoker, consider using nicotine gum or skin patches to help control withdrawal symptoms. Quitting smoking will help your lungs heal faster.   Reduce the chances of another bout of acute bronchitis by washing your hands frequently, avoiding people with cold symptoms, and trying not to touch your hands to your mouth, nose, or eyes.   Keep all follow-up visits as directed by your health care provider.  SEEK MEDICAL CARE IF: Your symptoms do not improve after 1 week of treatment.  SEEK IMMEDIATE MEDICAL CARE IF:  You develop an increased fever or chills.   You have chest pain.   You have severe shortness of breath.  You have bloody sputum.   You develop dehydration.  You faint or repeatedly feel like you are going to pass out.  You develop repeated vomiting.  You develop a severe headache. MAKE SURE YOU:   Understand these instructions.  Will watch your condition.  Will get help right away if you are not doing well or get worse. Document Released: 08/16/2004 Document Revised: 11/23/2013 Document  Reviewed: 12/30/2012 ExitCare Patient Information 2015 [place], [place]. This information is not intended to replace advice given to you by your health care provider. Make sure you discuss any questions you have with your health care provider.

## 2014-07-20 NOTE — Progress Notes (Signed)
   Subjective:    Patient ID: Alexander Harrington, male    DOB: 1991-05-12, 23 y.o.   MRN: [id number]  HPI The patient is a 23 YO man coming in today for a cough and congestion. He is having symptoms since Dec 17th though they are worse in the last week. He admits chills but isn't sure if he has had fever. He is coughing up yellow stuff. Nasal congestion and hoarseness in the morning. He denies ear pain or drainage. Denies chest pain and SOB.   Review of Systems  Constitutional: Positive for chills, activity change and fatigue. Negative for fever and appetite change.  HENT: Positive for congestion, postnasal drip, rhinorrhea, sinus pressure and sore throat. Negative for ear discharge, ear pain, facial swelling and hearing loss.   Respiratory: Positive for cough. Negative for chest tightness, shortness of breath and wheezing.   Cardiovascular: Negative for chest pain, palpitations and leg swelling.  Gastrointestinal: Negative.   Neurological: Negative.       Objective:   Physical Exam  Constitutional: He is oriented to person, place, and time. He appears well-developed and well-nourished.  HENT:  Head: Normocephalic and atraumatic.  Right Ear: External ear normal.  Left Ear: External ear normal.  Oropharynx with erythema, no exudates. Nasal turbinates with excessive white to yellow mucus and swelling.   Eyes: EOM are normal.  Neck: Normal range of motion.  Cardiovascular: Normal rate and regular rhythm.   No murmur heard. Pulmonary/Chest: Effort normal and breath sounds normal. No respiratory distress. He has no wheezes. He has no rales.  Abdominal: Soft.  Neurological: He is alert and oriented to person, place, and time.  Skin: Skin is warm and dry.   Filed Vitals:   07/20/14 1341  BP: 108/76  Pulse: 102  Temp: 97.8 F (36.6 C)  TempSrc: Oral  Resp: 18  Height: 5\' 10"  (1.778 m)  Weight: 224 lb (101.606 kg)  SpO2: 96%      Assessment & Plan:

## 2014-07-20 NOTE — Assessment & Plan Note (Signed)
Given length of illness likely bacterial invasion and rx for azithromycin. Also rx for flonase for excessive nasal congestion and drainage.

## 2014-10-07 ENCOUNTER — Encounter: Payer: Self-pay | Admitting: Internal Medicine

## 2014-11-09 ENCOUNTER — Other Ambulatory Visit (INDEPENDENT_AMBULATORY_CARE_PROVIDER_SITE_OTHER): Payer: BC Managed Care – PPO

## 2014-11-09 ENCOUNTER — Ambulatory Visit (INDEPENDENT_AMBULATORY_CARE_PROVIDER_SITE_OTHER): Payer: BC Managed Care – PPO | Admitting: Internal Medicine

## 2014-11-09 ENCOUNTER — Encounter: Payer: Self-pay | Admitting: Internal Medicine

## 2014-11-09 VITALS — BP 124/88 | HR 80 | Temp 99.1°F | Resp 18 | Ht 70.0 in | Wt 226.0 lb

## 2014-11-09 DIAGNOSIS — Z0000 Encounter for general adult medical examination without abnormal findings: Secondary | ICD-10-CM

## 2014-11-09 LAB — LIPID PANEL
Cholesterol: 237 mg/dL — ABNORMAL HIGH (ref 0–200)
HDL: 35.7 mg/dL — ABNORMAL LOW (ref >39.00)
LDL Cholesterol: 163 mg/dL — ABNORMAL HIGH (ref 0–99)
NONHDL: 201.3
TRIGLYCERIDES: 193 mg/dL — AB (ref 0.0–149.0)
Total CHOL/HDL Ratio: 7
VLDL: 38.6 mg/dL (ref 0.0–40.0)

## 2014-11-09 LAB — TSH: TSH: 0.76 u[IU]/mL (ref 0.35–4.50)

## 2014-11-09 LAB — CBC WITH DIFFERENTIAL/PLATELET
BASOS PCT: 0.3 % (ref 0.0–3.0)
Basophils Absolute: 0 10*3/uL (ref 0.0–0.1)
EOS PCT: 0.1 % (ref 0.0–5.0)
Eosinophils Absolute: 0 10*3/uL (ref 0.0–0.7)
HEMATOCRIT: 47.6 % (ref 39.0–52.0)
HEMOGLOBIN: 16.3 g/dL (ref 13.0–17.0)
LYMPHS ABS: 2.1 10*3/uL (ref 0.7–4.0)
Lymphocytes Relative: 15.6 % (ref 12.0–46.0)
MCHC: 34.2 g/dL (ref 30.0–36.0)
MCV: 82.2 fl (ref 78.0–100.0)
MONOS PCT: 4.9 % (ref 3.0–12.0)
Monocytes Absolute: 0.7 10*3/uL (ref 0.1–1.0)
Neutro Abs: 10.7 10*3/uL — ABNORMAL HIGH (ref 1.4–7.7)
Neutrophils Relative %: 79.1 % — ABNORMAL HIGH (ref 43.0–77.0)
Platelets: 435 10*3/uL — ABNORMAL HIGH (ref 150.0–400.0)
RBC: 5.8 Mil/uL (ref 4.22–5.81)
RDW: 12.2 % (ref 11.5–15.5)
WBC: 13.6 10*3/uL — AB (ref 4.0–10.5)

## 2014-11-09 LAB — COMPREHENSIVE METABOLIC PANEL
ALT: 34 U/L (ref 0–53)
AST: 24 U/L (ref 0–37)
Albumin: 4.8 g/dL (ref 3.5–5.2)
Alkaline Phosphatase: 66 U/L (ref 39–117)
BUN: 12 mg/dL (ref 6–23)
CALCIUM: 9.8 mg/dL (ref 8.4–10.5)
CHLORIDE: 103 meq/L (ref 96–112)
CO2: 27 meq/L (ref 19–32)
CREATININE: 1 mg/dL (ref 0.40–1.50)
GFR: 97.65 mL/min (ref >60.00)
Glucose, Bld: 101 mg/dL — ABNORMAL HIGH (ref 70–99)
Potassium: 3.9 mEq/L (ref 3.5–5.1)
Sodium: 137 mEq/L (ref 135–145)
Total Bilirubin: 0.6 mg/dL (ref 0.2–1.2)
Total Protein: 8.1 g/dL (ref 6.0–8.3)

## 2014-11-09 NOTE — Assessment & Plan Note (Signed)
Vaccines were reviewed Exam done Labs ordered Pt ed material was given 

## 2014-11-09 NOTE — Progress Notes (Signed)
   Subjective:    Patient ID: Alexander Harrington, male    DOB: Apr 01, 1991, 24 y.o.   MRN: [id number]  HPI Comments: He is here for a CPX today - offers no new complaints.     Review of Systems  Constitutional: Negative.  Negative for fever, chills, diaphoresis, appetite change and fatigue.  HENT: Negative.  Negative for trouble swallowing.   Eyes: Negative.   Respiratory: Negative.   Cardiovascular: Negative.  Negative for chest pain, palpitations and leg swelling.  Gastrointestinal: Negative.  Negative for nausea, abdominal pain, diarrhea and constipation.  Endocrine: Negative.   Genitourinary: Negative.   Musculoskeletal: Negative.   Skin: Negative.   Allergic/Immunologic: Negative.   Neurological: Negative.   Hematological: Negative.   Psychiatric/Behavioral: Positive for dysphoric mood. Negative for suicidal ideas, hallucinations, behavioral problems, confusion, sleep disturbance, self-injury, decreased concentration and agitation. The patient is nervous/anxious. The patient is not hyperactive.        Objective:   Physical Exam  Constitutional: He is oriented to person, place, and time. He appears well-developed and well-nourished. No distress.  HENT:  Head: Normocephalic and atraumatic.  Mouth/Throat: Oropharynx is clear and moist. No oropharyngeal exudate.  Eyes: Conjunctivae are normal. Right eye exhibits no discharge. Left eye exhibits no discharge. No scleral icterus.  Neck: Normal range of motion. Neck supple. No JVD present. No tracheal deviation present. No thyromegaly present.  Cardiovascular: Normal rate, regular rhythm, normal heart sounds and intact distal pulses.  Exam reveals no gallop and no friction rub.   No murmur heard. Pulmonary/Chest: Effort normal and breath sounds normal. No stridor. No respiratory distress. He has no wheezes. He has no rales. He exhibits no tenderness.  Abdominal: Soft. Bowel sounds are normal. He exhibits no distension and no mass. There  is no tenderness. There is no rebound and no guarding.  Musculoskeletal: Normal range of motion. He exhibits no edema or tenderness.  Lymphadenopathy:    He has no cervical adenopathy.  Neurological: He is oriented to person, place, and time.  Skin: Skin is warm and dry. No rash noted. He is not diaphoretic. No erythema. No pallor.  Psychiatric: He has a normal mood and affect. His behavior is normal. Judgment and thought content normal.     Lab Results  Component Value Date   WBC 11.6* 04/07/2012   HGB 15.3 04/07/2012   HCT 45.7 04/07/2012   PLT 465* 04/07/2012   GLUCOSE 95 04/07/2012   CHOL 198 02/21/2011   TRIG 152.0* 02/21/2011   HDL 46.30 02/21/2011   LDLCALC 121* 02/21/2011   ALT 26 04/07/2012   AST 23 04/07/2012   NA 136 04/07/2012   K 3.5 04/07/2012   CL 99 04/07/2012   CREATININE 0.99 04/07/2012   BUN 10 04/07/2012   CO2 26 04/07/2012   TSH 2.00 12/31/2011       Assessment & Plan:

## 2014-11-09 NOTE — Patient Instructions (Signed)

## 2014-11-09 NOTE — Progress Notes (Signed)
Pre visit review using our clinic review tool, if applicable. No additional management support is needed unless otherwise documented below in the visit note. 

## 2015-02-08 ENCOUNTER — Ambulatory Visit (INDEPENDENT_AMBULATORY_CARE_PROVIDER_SITE_OTHER)
Admission: RE | Admit: 2015-02-08 | Discharge: 2015-02-08 | Disposition: A | Discharge status: Home / Self Care | Payer: BC Managed Care – PPO | Source: Ambulatory Visit | Attending: Internal Medicine | Admitting: Internal Medicine

## 2015-02-08 ENCOUNTER — Other Ambulatory Visit (INDEPENDENT_AMBULATORY_CARE_PROVIDER_SITE_OTHER): Payer: BC Managed Care – PPO

## 2015-02-08 ENCOUNTER — Encounter: Payer: Self-pay | Admitting: Internal Medicine

## 2015-02-08 ENCOUNTER — Ambulatory Visit (INDEPENDENT_AMBULATORY_CARE_PROVIDER_SITE_OTHER): Payer: BC Managed Care – PPO | Admitting: Internal Medicine

## 2015-02-08 VITALS — BP 116/82 | HR 121 | Temp 98.6°F | Resp 16 | Ht 70.0 in | Wt 227.0 lb

## 2015-02-08 DIAGNOSIS — D72829 Elevated white blood cell count, unspecified: Secondary | ICD-10-CM | POA: Diagnosis not present

## 2015-02-08 DIAGNOSIS — R059 Cough, unspecified: Secondary | ICD-10-CM

## 2015-02-08 DIAGNOSIS — R05 Cough: Secondary | ICD-10-CM | POA: Diagnosis not present

## 2015-02-08 DIAGNOSIS — E038 Other specified hypothyroidism: Secondary | ICD-10-CM | POA: Insufficient documentation

## 2015-02-08 LAB — CBC WITH DIFFERENTIAL/PLATELET
BASOS ABS: 0 10*3/uL (ref 0.0–0.1)
Basophils Relative: 0.3 % (ref 0.0–3.0)
EOS ABS: 0.1 10*3/uL (ref 0.0–0.7)
Eosinophils Relative: 0.9 % (ref 0.0–5.0)
HEMATOCRIT: 47.9 % (ref 39.0–52.0)
Hemoglobin: 15.9 g/dL (ref 13.0–17.0)
LYMPHS PCT: 35.2 % (ref 12.0–46.0)
Lymphs Abs: 3.9 10*3/uL (ref 0.7–4.0)
MCHC: 33.2 g/dL (ref 30.0–36.0)
MCV: 83.4 fl (ref 78.0–100.0)
Monocytes Absolute: 0.8 10*3/uL (ref 0.1–1.0)
Monocytes Relative: 7.4 % (ref 3.0–12.0)
NEUTROS PCT: 56.2 % (ref 43.0–77.0)
Neutro Abs: 6.2 10*3/uL (ref 1.4–7.7)
Platelets: 371 10*3/uL (ref 150.0–400.0)
RBC: 5.74 Mil/uL (ref 4.22–5.81)
RDW: 12.4 % (ref 11.5–15.5)
WBC: 11 10*3/uL — ABNORMAL HIGH (ref 4.0–10.5)

## 2015-02-08 LAB — TSH: TSH: 1.22 u[IU]/mL (ref 0.35–4.50)

## 2015-02-08 NOTE — Progress Notes (Signed)
Subjective:  Patient ID: Alexander Harrington, male    DOB: 12/14/1990  Age: 24 y.o. MRN: [id number]  CC: Cough   HPI Alexander Harrington presents for a nonproductive cough for 1 week that is improving. At the onset of the cough a week ago he had fever and felt like the glands in his neck were swollen but all of those symptoms have resolved.  Outpatient Prescriptions Prior to Visit  Medication Sig Dispense Refill  . dextroamphetamine (DEXEDRINE SPANSULE) 15 MG 24 hr capsule     . ARIPiprazole (ABILIFY) 20 MG tablet Take 20 mg by mouth daily.    . clonazePAM (KLONOPIN) 2 MG tablet Take 2 mg by mouth 2 (two) times daily.    . fluticasone (FLONASE) 50 MCG/ACT nasal spray Place 2 sprays into both nostrils daily. 16 g 6  . ibuprofen (ADVIL) 200 MG tablet Take 400 mg by mouth every 6 (six) hours as needed. Headache, pain     No facility-administered medications prior to visit.    ROS Review of Systems  Constitutional: Positive for fatigue. Negative for fever, chills, diaphoresis, activity change, appetite change and unexpected weight change.  HENT: Negative.  Negative for congestion, ear pain, facial swelling, rhinorrhea, sinus pressure, sneezing, sore throat, tinnitus, trouble swallowing and voice change.   Eyes: Negative.   Respiratory: Positive for cough. Negative for choking, chest tightness, shortness of breath, wheezing and stridor.   Cardiovascular: Negative.  Negative for chest pain, palpitations and leg swelling.  Gastrointestinal: Negative.  Negative for nausea, vomiting, abdominal pain, diarrhea, constipation and blood in stool.  Endocrine: Negative.   Genitourinary: Negative.   Musculoskeletal: Negative.  Negative for myalgias, back pain, joint swelling, arthralgias and neck pain.  Skin: Negative.  Negative for rash.  Allergic/Immunologic: Negative.   Neurological: Negative.  Negative for dizziness, tremors, syncope, speech difficulty, light-headedness and headaches.  Hematological:  Negative.   Psychiatric/Behavioral: Negative for suicidal ideas, sleep disturbance, self-injury and dysphoric mood. The patient is nervous/anxious.     Objective:  BP 116/82 mmHg  Pulse 121  Temp(Src) 98.6 F (37 C) (Oral)  Resp 16  Ht 5\' 10"  (1.778 m)  Wt 227 lb (102.967 kg)  BMI 32.57 kg/m2  SpO2 95%  BP Readings from Last 3 Encounters:  02/08/15 116/82  11/09/14 124/88  07/20/14 108/76    Wt Readings from Last 3 Encounters:  02/08/15 227 lb (102.967 kg)  11/09/14 226 lb (102.513 kg)  07/20/14 224 lb (101.606 kg)    Physical Exam  Constitutional: He is oriented to person, place, and time.  Non-toxic appearance. He does not have a sickly appearance. He does not appear ill. No distress.  HENT:  Mouth/Throat: Oropharynx is clear and moist. No oropharyngeal exudate.  Eyes: Conjunctivae are normal. Right eye exhibits no discharge. Left eye exhibits no discharge. No scleral icterus.  Neck: Normal range of motion. Neck supple. No JVD present. No tracheal deviation present. No thyromegaly present.  Cardiovascular: Normal rate, regular rhythm, normal heart sounds and intact distal pulses.  Exam reveals no gallop and no friction rub.   No murmur heard. Pulmonary/Chest: Effort normal and breath sounds normal. No stridor. No respiratory distress. He has no wheezes. He has no rales. He exhibits no tenderness.  Abdominal: Soft. Bowel sounds are normal. He exhibits no distension and no mass. There is no tenderness. There is no rebound and no guarding.  Musculoskeletal: Normal range of motion. He exhibits no edema or tenderness.  Lymphadenopathy:       Head (  right side): No submental, no submandibular, no tonsillar, no preauricular, no posterior auricular and no occipital adenopathy present.       Head (left side): No submental, no submandibular, no tonsillar, no preauricular, no posterior auricular and no occipital adenopathy present.    He has no cervical adenopathy.       Right  cervical: No superficial cervical, no deep cervical and no posterior cervical adenopathy present.      Left cervical: No superficial cervical, no deep cervical and no posterior cervical adenopathy present.    He has no axillary adenopathy.       Right axillary: No pectoral and no lateral adenopathy present.       Left axillary: No pectoral and no lateral adenopathy present.      Right: No supraclavicular adenopathy present.       Left: No supraclavicular adenopathy present.  Neurological: He is oriented to person, place, and time.  Skin: Skin is warm and dry. No rash noted. He is not diaphoretic. No erythema. No pallor.  Psychiatric: His speech is normal and behavior is normal. Judgment and thought content normal. His mood appears anxious. His affect is not angry. Cognition and memory are normal. He does not exhibit a depressed mood.    Lab Results  Component Value Date   WBC 11.0* 02/08/2015   HGB 15.9 02/08/2015   HCT 47.9 02/08/2015   PLT 371.0 02/08/2015   GLUCOSE 101* 11/09/2014   CHOL 237* 11/09/2014   TRIG 193.0* 11/09/2014   HDL 35.70* 11/09/2014   LDLCALC 163* 11/09/2014   ALT 34 11/09/2014   AST 24 11/09/2014   NA 137 11/09/2014   K 3.9 11/09/2014   CL 103 11/09/2014   CREATININE 1.00 11/09/2014   BUN 12 11/09/2014   CO2 27 11/09/2014   TSH 1.22 02/08/2015    No results found.  Assessment & Plan:   Alexander Harrington was seen today for cough.  Diagnoses and all orders for this visit:  Leukocytosis- his white blood cell count is stable, the rest of his cell lines are all within normal limits. This is a benign chronic condition for him that does not require any further evaluation at this time. Orders: -     CBC with Differential/Platelet; Future  Cough- his exam and chest x-ray are normal. He has a viral upper respiratory infection. His symptoms are improving and he doesn't want anything for the cough. Orders: -     DG Chest 2 View; Future  Other specified  hypothyroidism- his TSH is in the normal range, will continue to current dose of Synthroid. Orders: -     TSH; Future   I have discontinued Alexander Harrington's ibuprofen, ARIPiprazole, and fluticasone. I am also having him maintain his dextroamphetamine, REXULTI, clonazePAM, Vitamin D (Ergocalciferol), FETZIMA, and levothyroxine.  Meds ordered this encounter  Medications  . REXULTI 2 MG TABS    Sig: TK 1 T PO  QHS    Refill:  12  . clonazePAM (KLONOPIN) 1 MG tablet    Sig: TK 1 T PO TID PRN    Refill:  3  . Vitamin D, Ergocalciferol, (DRISDOL) 50000 UNITS CAPS capsule    Sig: TK 1 C PO TWICE WEEKLY    Refill:  12  . FETZIMA 80 MG CP24    Sig: TK ONE C PO  D    Refill:  12  . levothyroxine (SYNTHROID, LEVOTHROID) 25 MCG tablet    Sig: TK 1 T PO  QAM ON AN  EMPTY STOMACH    Refill:  0     Follow-up: Return in about 3 weeks (around 03/01/2015).  Scarlette Calico, MD

## 2015-02-08 NOTE — Progress Notes (Signed)
Pre visit review using our clinic review tool, if applicable. No additional management support is needed unless otherwise documented below in the visit note. 

## 2015-02-08 NOTE — Patient Instructions (Signed)
Upper Respiratory Infection, Adult An upper respiratory infection (URI) is also known as the common cold. It is often caused by a type of germ (virus). Colds are easily spread (contagious). You can pass it to others by kissing, coughing, sneezing, or drinking out of the same glass. Usually, you get better in 1 or 2 weeks.  HOME CARE   Only take medicine as told by your doctor.  Use a warm mist humidifier or breathe in steam from a hot shower.  Drink enough water and fluids to keep your pee (urine) clear or pale yellow.  Get plenty of rest.  Return to work when your temperature is back to normal or as told by your doctor. You may use a face mask and wash your hands to stop your cold from spreading. GET HELP RIGHT AWAY IF:   After the first few days, you feel you are getting worse.  You have questions about your medicine.  You have chills, shortness of breath, or brown or red spit (mucus).  You have yellow or brown snot (nasal discharge) or pain in the face, especially when you bend forward.  You have a fever, puffy (swollen) neck, pain when you swallow, or white spots in the back of your throat.  You have a bad headache, ear pain, sinus pain, or chest pain.  You have a high-pitched whistling sound when you breathe in and out (wheezing).  You have a lasting cough or cough up blood.  You have sore muscles or a stiff neck. MAKE SURE YOU:   Understand these instructions.  Will watch your condition.  Will get help right away if you are not doing well or get worse. Document Released: 12/26/2007 Document Revised: 10/01/2011 Document Reviewed: 10/14/2013 [employer] Patient Information 2015 [employer]. This information is not intended to replace advice given to you by your health care provider. Make sure you discuss any questions you have with your health care provider.  

## 2015-07-12 ENCOUNTER — Ambulatory Visit: Payer: BC Managed Care – PPO | Admitting: Licensed Clinical Social Worker

## 2015-07-19 ENCOUNTER — Ambulatory Visit (INDEPENDENT_AMBULATORY_CARE_PROVIDER_SITE_OTHER): Payer: BC Managed Care – PPO | Admitting: Licensed Clinical Social Worker

## 2015-07-19 DIAGNOSIS — F314 Bipolar disorder, current episode depressed, severe, without psychotic features: Secondary | ICD-10-CM

## 2015-07-26 ENCOUNTER — Ambulatory Visit: Payer: BC Managed Care – PPO | Admitting: Licensed Clinical Social Worker

## 2015-08-30 ENCOUNTER — Other Ambulatory Visit (INDEPENDENT_AMBULATORY_CARE_PROVIDER_SITE_OTHER): Payer: BC Managed Care – PPO

## 2015-08-30 ENCOUNTER — Ambulatory Visit (INDEPENDENT_AMBULATORY_CARE_PROVIDER_SITE_OTHER): Payer: BC Managed Care – PPO | Admitting: Internal Medicine

## 2015-08-30 ENCOUNTER — Encounter: Payer: Self-pay | Admitting: Internal Medicine

## 2015-08-30 VITALS — BP 104/80 | HR 132 | Temp 97.7°F | Wt 225.0 lb

## 2015-08-30 DIAGNOSIS — R000 Tachycardia, unspecified: Secondary | ICD-10-CM | POA: Diagnosis not present

## 2015-08-30 DIAGNOSIS — R0789 Other chest pain: Secondary | ICD-10-CM

## 2015-08-30 DIAGNOSIS — E038 Other specified hypothyroidism: Secondary | ICD-10-CM | POA: Diagnosis not present

## 2015-08-30 DIAGNOSIS — F332 Major depressive disorder, recurrent severe without psychotic features: Secondary | ICD-10-CM | POA: Diagnosis not present

## 2015-08-30 LAB — HEPATIC FUNCTION PANEL
ALBUMIN: 4.9 g/dL (ref 3.5–5.2)
ALT: 41 U/L (ref 0–53)
AST: 26 U/L (ref 0–37)
Alkaline Phosphatase: 68 U/L (ref 39–117)
BILIRUBIN DIRECT: 0.1 mg/dL (ref 0.0–0.3)
BILIRUBIN TOTAL: 0.6 mg/dL (ref 0.2–1.2)
TOTAL PROTEIN: 8.4 g/dL — AB (ref 6.0–8.3)

## 2015-08-30 LAB — BASIC METABOLIC PANEL
BUN: 11 mg/dL (ref 6–23)
CALCIUM: 9.9 mg/dL (ref 8.4–10.5)
CHLORIDE: 100 meq/L (ref 96–112)
CO2: 29 mEq/L (ref 19–32)
CREATININE: 0.94 mg/dL (ref 0.40–1.50)
GFR: 104.17 mL/min (ref >60.00)
Glucose, Bld: 109 mg/dL — ABNORMAL HIGH (ref 70–99)
Potassium: 4 mEq/L (ref 3.5–5.1)
Sodium: 136 mEq/L (ref 135–145)

## 2015-08-30 LAB — CBC WITH DIFFERENTIAL/PLATELET
BASOS ABS: 0.1 10*3/uL (ref 0.0–0.1)
BASOS PCT: 0.6 % (ref 0.0–3.0)
Eosinophils Absolute: 0.1 10*3/uL (ref 0.0–0.7)
Eosinophils Relative: 0.9 % (ref 0.0–5.0)
HCT: 50.4 % (ref 39.0–52.0)
Hemoglobin: 16.9 g/dL (ref 13.0–17.0)
LYMPHS ABS: 2.8 10*3/uL (ref 0.7–4.0)
Lymphocytes Relative: 31.9 % (ref 12.0–46.0)
MCHC: 33.5 g/dL (ref 30.0–36.0)
MCV: 83.2 fl (ref 78.0–100.0)
MONOS PCT: 6.9 % (ref 3.0–12.0)
Monocytes Absolute: 0.6 10*3/uL (ref 0.1–1.0)
NEUTROS ABS: 5.2 10*3/uL (ref 1.4–7.7)
NEUTROS PCT: 59.7 % (ref 43.0–77.0)
PLATELETS: 425 10*3/uL — AB (ref 150.0–400.0)
RBC: 6.07 Mil/uL — ABNORMAL HIGH (ref 4.22–5.81)
RDW: 12.4 % (ref 11.5–15.5)
WBC: 8.7 10*3/uL (ref 4.0–10.5)

## 2015-08-30 LAB — GLUCOSE, POCT (MANUAL RESULT ENTRY): POC Glucose: 101 mg/dl — AB (ref 70–99)

## 2015-08-30 LAB — TSH: TSH: 1.8 u[IU]/mL (ref 0.35–4.50)

## 2015-08-30 MED ORDER — PROPRANOLOL HCL ER 80 MG PO CP24: 80.0000 mg | ORAL_CAPSULE | Freq: Every day | ORAL | Status: DC

## 2015-08-30 MED ORDER — LEVOMILNACIPRAN HCL ER 80 MG PO CP24: ORAL_CAPSULE | ORAL | Status: DC

## 2015-08-30 NOTE — Assessment & Plan Note (Signed)
2/17 ?due to meds Hold Fetzima for now Cut back on caffeine Start Propranolol EKG, Labs

## 2015-08-30 NOTE — Progress Notes (Signed)
   Subjective:  Patient ID: Alexander Harrington, male    DOB: 1991-02-11  Age: 25 y.o. MRN: [id number]  CC: No chief complaint on file.   HPI Alexander Harrington presents for HA, chest tightness, rapid HR off and on  Outpatient Prescriptions Prior to Visit  Medication Sig Dispense Refill  . dextroamphetamine (DEXEDRINE SPANSULE) 15 MG 24 hr capsule     . levothyroxine (SYNTHROID, LEVOTHROID) 25 MCG tablet TK 1 T PO  QAM ON AN EMPTY STOMACH  0  . REXULTI 2 MG TABS TK 1 T PO  QHS  12  . Vitamin D, Ergocalciferol, (DRISDOL) 50000 UNITS CAPS capsule TK 1 C PO TWICE WEEKLY  12  . clonazePAM (KLONOPIN) 1 MG tablet TK 1 T PO TID PRN  3  . Force 80 MG CP24 Reported on 08/30/2015  12   No facility-administered medications prior to visit.    ROS Review of Systems  Objective:  BP 104/80 mmHg  Pulse 132  Temp(Src) 97.7 F (36.5 C) (Oral)  Wt 225 lb (102.059 kg)  SpO2 98%  BP Readings from Last 3 Encounters:  08/30/15 104/80  02/08/15 116/82  11/09/14 124/88    Wt Readings from Last 3 Encounters:  08/30/15 225 lb (102.059 kg)  02/08/15 227 lb (102.967 kg)  11/09/14 226 lb (102.513 kg)    Physical Exam  Lab Results  Component Value Date   WBC 11.0* 02/08/2015   HGB 15.9 02/08/2015   HCT 47.9 02/08/2015   PLT 371.0 02/08/2015   GLUCOSE 101* 11/09/2014   CHOL 237* 11/09/2014   TRIG 193.0* 11/09/2014   HDL 35.70* 11/09/2014   LDLCALC 163* 11/09/2014   ALT 34 11/09/2014   AST 24 11/09/2014   NA 137 11/09/2014   K 3.9 11/09/2014   CL 103 11/09/2014   CREATININE 1.00 11/09/2014   BUN 12 11/09/2014   CO2 27 11/09/2014   TSH 1.22 02/08/2015    Dg Chest 2 View  02/08/2015  CLINICAL DATA:  Cough, congestion, shortness of breath since last week. EXAM: CHEST  2 VIEW COMPARISON:  12/31/2011 FINDINGS: The heart size and mediastinal contours are within normal limits. Both lungs are clear. The visualized skeletal structures are unremarkable. IMPRESSION: No active cardiopulmonary disease.  Electronically Signed   By: Rolm Baptise M.D.   On: 02/08/2015 14:00    Assessment & Plan:   There are no diagnoses linked to this encounter. I have discontinued Mr. Soy's clonazePAM. I am also having him maintain his dextroamphetamine, REXULTI, Vitamin D (Ergocalciferol), levothyroxine, FETZIMA, and OLANZapine-FLUoxetine.  Meds ordered this encounter  Medications  . FETZIMA 120 MG CP24    Sig: Take 1 capsule by mouth daily.    Refill:  10  . OLANZapine-FLUoxetine (SYMBYAX) 3-25 MG capsule    Sig: Take 1 capsule by mouth daily.    Refill:  5     Follow-up: No Follow-up on file.  Walker Kehr, MD

## 2015-08-30 NOTE — Patient Instructions (Signed)
Hold Fetzima for now Cut back on caffeine

## 2015-08-30 NOTE — Progress Notes (Signed)
Pre visit review using our clinic review tool, if applicable. No additional management support is needed unless otherwise documented below in the visit note. 

## 2015-09-01 NOTE — Assessment & Plan Note (Signed)
Hold Fetzima for now

## 2015-09-01 NOTE — Assessment & Plan Note (Addendum)
On Levothroid Labs 

## 2015-09-06 ENCOUNTER — Ambulatory Visit (INDEPENDENT_AMBULATORY_CARE_PROVIDER_SITE_OTHER): Payer: BC Managed Care – PPO | Admitting: Internal Medicine

## 2015-09-06 ENCOUNTER — Encounter: Payer: Self-pay | Admitting: Internal Medicine

## 2015-09-06 VITALS — BP 138/88 | HR 90 | Temp 97.9°F | Resp 16 | Ht 70.0 in | Wt 227.0 lb

## 2015-09-06 DIAGNOSIS — F1994 Other psychoactive substance use, unspecified with psychoactive substance-induced mood disorder: Secondary | ICD-10-CM

## 2015-09-06 DIAGNOSIS — R000 Tachycardia, unspecified: Secondary | ICD-10-CM | POA: Diagnosis not present

## 2015-09-06 NOTE — Progress Notes (Signed)
Pre visit review using our clinic review tool, if applicable. No additional management support is needed unless otherwise documented below in the visit note. 

## 2015-09-06 NOTE — Progress Notes (Signed)
Subjective:  Patient ID: Alexander Harrington, male    DOB: 06-16-1991  Age: 25 y.o. MRN: [id number]  CC: Tachycardia   HPI Maksym Laubenstein presents for f/up on elevated heart rate, he was seen a week ago and asked to stop fetzima which he has done and to start propranolol, he was experiencing HA and chest tightness but those symptoms have resolved. He reveals to me today that he has been taking dextroamphetamine that was prescribed by his psychiatrist, he took a dose of that this morning. He feels much better today with no changes in his baseline level of anxiety and insomnia.  Outpatient Prescriptions Prior to Visit  Medication Sig Dispense Refill  . levothyroxine (SYNTHROID, LEVOTHROID) 25 MCG tablet TK 1 T PO  QAM ON AN EMPTY STOMACH  0  . propranolol ER (INDERAL LA) 80 MG 24 hr capsule Take 1 capsule (80 mg total) by mouth daily. 30 capsule 3  . REXULTI 2 MG TABS TK 1 T PO  QHS  12  . Vitamin D, Ergocalciferol, (DRISDOL) 50000 UNITS CAPS capsule TK 1 C PO TWICE WEEKLY  12  . Levomilnacipran HCl ER (FETZIMA) 80 MG CP24 1 po qd (Patient not taking: Reported on 09/06/2015) 30 capsule 3   No facility-administered medications prior to visit.    ROS Review of Systems  Constitutional: Positive for fatigue. Negative for chills, activity change, appetite change and unexpected weight change.  HENT: Negative.   Eyes: Negative.   Respiratory: Negative.  Negative for cough, choking, chest tightness, shortness of breath and stridor.   Cardiovascular: Negative.  Negative for chest pain, palpitations and leg swelling.  Gastrointestinal: Negative.  Negative for nausea, vomiting, abdominal pain, diarrhea, constipation and blood in stool.  Endocrine: Negative.   Genitourinary: Negative.   Musculoskeletal: Negative.  Negative for neck pain.  Skin: Negative.   Allergic/Immunologic: Negative.   Neurological: Negative.  Negative for dizziness, weakness, light-headedness and headaches.  Hematological:  Negative.   Psychiatric/Behavioral: Positive for sleep disturbance. Negative for suicidal ideas, confusion, self-injury, dysphoric mood, decreased concentration and agitation. The patient is nervous/anxious. The patient is not hyperactive.     Objective:  BP 138/88 mmHg  Pulse 90  Temp(Src) 97.9 F (36.6 C) (Oral)  Ht 5\' 10"  (1.778 m)  Wt 227 lb (102.967 kg)  BMI 32.57 kg/m2  SpO2 99%  BP Readings from Last 3 Encounters:  09/06/15 138/88  08/30/15 104/80  02/08/15 116/82    Wt Readings from Last 3 Encounters:  09/06/15 227 lb (102.967 kg)  08/30/15 225 lb (102.059 kg)  02/08/15 227 lb (102.967 kg)    Physical Exam  Constitutional: He is oriented to person, place, and time. He appears well-developed and well-nourished. No distress.  HENT:  Head: Normocephalic and atraumatic.  Mouth/Throat: Oropharynx is clear and moist. Mucous membranes are not pale, dry and not cyanotic. No oropharyngeal exudate, posterior oropharyngeal edema, posterior oropharyngeal erythema or tonsillar abscesses.  Eyes: Right eye exhibits no discharge. Left eye exhibits no discharge. No scleral icterus. Right pupil is not reactive. Right pupil is round. Left pupil is not reactive. Left pupil is round. Pupils are equal.  Bilateral pupils are dilated and non-reactive  Neck: Normal range of motion. Neck supple. No JVD present. No tracheal deviation present. No thyromegaly present.  Cardiovascular: Normal rate, regular rhythm, normal heart sounds and intact distal pulses.  Exam reveals no gallop and no friction rub.   No murmur heard. Pulmonary/Chest: Effort normal and breath sounds normal. No stridor. No respiratory  distress. He has no wheezes. He has no rales. He exhibits no tenderness.  Abdominal: Soft. Bowel sounds are normal. He exhibits no distension and no mass. There is no tenderness. There is no rebound and no guarding.  Musculoskeletal: Normal range of motion. He exhibits no edema or tenderness.    Lymphadenopathy:    He has no cervical adenopathy.  Neurological: He is oriented to person, place, and time.  Skin: Skin is warm and dry. No rash noted. He is not diaphoretic. No erythema. No pallor.  Psychiatric: He has a normal mood and affect. His behavior is normal. Judgment and thought content normal.  Vitals reviewed.   Lab Results  Component Value Date   WBC 8.7 08/30/2015   HGB 16.9 08/30/2015   HCT 50.4 08/30/2015   PLT 425.0* 08/30/2015   GLUCOSE 109* 08/30/2015   CHOL 237* 11/09/2014   TRIG 193.0* 11/09/2014   HDL 35.70* 11/09/2014   LDLCALC 163* 11/09/2014   ALT 41 08/30/2015   AST 26 08/30/2015   NA 136 08/30/2015   K 4.0 08/30/2015   CL 100 08/30/2015   CREATININE 0.94 08/30/2015   BUN 11 08/30/2015   CO2 29 08/30/2015   TSH 1.80 08/30/2015    Dg Chest 2 View  02/08/2015  CLINICAL DATA:  Cough, congestion, shortness of breath since last week. EXAM: CHEST  2 VIEW COMPARISON:  12/31/2011 FINDINGS: The heart size and mediastinal contours are within normal limits. Both lungs are clear. The visualized skeletal structures are unremarkable. IMPRESSION: No active cardiopulmonary disease. Electronically Signed   By: Rolm Baptise M.D.   On: 02/08/2015 14:00    Assessment & Plan:   Oniel was seen today for tachycardia.  Diagnoses and all orders for this visit:  Tachycardia- this is most likely related to his use of an amphetamine, I will check his UDS today to see how high his level is, he is better off of fetzima and on propranolol, will cont with that, he will f/up with his psych about antidepressants  Substance induced mood disorder ([place]) - psych f/up as indicated  I have discontinued Mr. Taul's Levomilnacipran HCl ER. I am also having him maintain his REXULTI, Vitamin D (Ergocalciferol), levothyroxine, and propranolol ER.  No orders of the defined types were placed in this encounter.     Follow-up: No Follow-up on file.  Scarlette Calico, MD

## 2015-09-06 NOTE — Patient Instructions (Signed)

## 2015-09-14 ENCOUNTER — Encounter: Payer: Self-pay | Admitting: Nurse Practitioner

## 2015-09-14 ENCOUNTER — Encounter: Payer: Self-pay | Admitting: Internal Medicine

## 2015-09-14 ENCOUNTER — Ambulatory Visit (INDEPENDENT_AMBULATORY_CARE_PROVIDER_SITE_OTHER): Payer: BC Managed Care – PPO | Admitting: Nurse Practitioner

## 2015-09-14 VITALS — BP 116/88 | HR 107 | Temp 97.8°F | Ht 70.0 in | Wt 224.0 lb

## 2015-09-14 DIAGNOSIS — J069 Acute upper respiratory infection, unspecified: Secondary | ICD-10-CM | POA: Diagnosis not present

## 2015-09-14 DIAGNOSIS — B9789 Other viral agents as the cause of diseases classified elsewhere: Principal | ICD-10-CM

## 2015-09-14 MED ORDER — MOMETASONE FUROATE 50 MCG/ACT NA SUSP: 2.0000 | Freq: Every day | NASAL | Status: DC

## 2015-09-14 NOTE — Assessment & Plan Note (Signed)
New onset Will treat conservatively due to probable viral nature Nasonex sent to pharmacy, instructions given Continue OTC measures FU prn worsening/failure to improve.

## 2015-09-14 NOTE — Patient Instructions (Addendum)
Nasonex and continue your home regimens.   Laryngitis Laryngitis is inflammation of your vocal cords. This causes hoarseness, coughing, loss of voice, sore throat, or a dry throat. Your vocal cords are two bands of muscles that are found in your throat. When you speak, these cords come together and vibrate. These vibrations come out through your mouth as sound. When your vocal cords are inflamed, your voice sounds different. Laryngitis can be temporary (acute) or long-term (chronic). Most cases of acute laryngitis improve with time. Chronic laryngitis is laryngitis that lasts for more than three weeks. CAUSES Acute laryngitis may be caused by:  A viral infection.  Lots of talking, yelling, or singing. This is also called vocal strain.  Bacterial infections. Chronic laryngitis may be caused by:  Vocal strain.  Injury to your vocal cords.  Acid reflux (gastroesophageal reflux disease or GERD).  Allergies.  Sinus infection.  Smoking.  Alcohol abuse.  Breathing in chemicals or dust.  Growths on the vocal cords. RISK FACTORS Risk factors for laryngitis include:  Smoking.  Alcohol abuse.  Having allergies. SIGNS AND SYMPTOMS Symptoms of laryngitis may include:  Low, hoarse voice.  Loss of voice.  Dry cough.  Sore throat.  Stuffy nose. DIAGNOSIS Laryngitis may be diagnosed by:  Physical exam.  Throat culture.  Blood test.  Laryngoscopy. This procedure allows your health care provider to look at your vocal cords with a mirror or viewing tube. TREATMENT Treatment for laryngitis depends on what is causing it. Usually, treatment involves resting your voice and using medicines to soothe your throat. However, if your laryngitis is caused by a bacterial infection, you may need to take antibiotic medicine. If your laryngitis is caused by a growth, you may need to have a procedure to remove it. HOME CARE INSTRUCTIONS  Drink enough fluid to keep your urine clear or  pale yellow.  Breathe in moist air. Use a humidifier if you live in a dry climate.  Take medicines only as directed by your health care provider.  If you were prescribed an antibiotic medicine, finish it all even if you start to feel better.  Do not smoke cigarettes or electronic cigarettes. If you need help quitting, ask your health care provider.  Talk as little as possible. Also avoid whispering, which can cause vocal strain.  Write instead of talking. Do this until your voice is back to normal. SEEK MEDICAL CARE IF:  You have a fever.  You have increasing pain.  You have difficulty swallowing. SEEK IMMEDIATE MEDICAL CARE IF:  You cough up blood.  You have trouble breathing.   This information is not intended to replace advice given to you by your health care provider. Make sure you discuss any questions you have with your health care provider.   Document Released: 07/09/2005 Document Revised: 07/30/2014 Document Reviewed: 12/22/2013 Elsevier Interactive Patient Education [employer].

## 2015-09-14 NOTE — Progress Notes (Signed)
Patient ID: Alexander Harrington, male    DOB: Apr 01, 1991  Age: 25 y.o. MRN: [id number]  CC: Laryngitis   HPI Alexander Harrington presents for CC of laryngitis x 5 days.   1) Fatigue  Laryngitis  Denies heart burn  Headache on right side of head   Treatment to date:  Throat spray from parents Mucinex  Nyquil  Tea with Honey   Sick contacts: Denies  History Alexander Harrington has a past medical history of Anxiety; Depression; and Bipolar 2 disorder ([place]).   Alexander Harrington has no past surgical history on file.   His family history includes Alcohol abuse in his maternal grandfather and maternal grandmother; Arthritis in his mother; Depression in his mother; Drug abuse in his maternal grandfather and maternal grandmother; Mental illness in his mother.Alexander Harrington reports that Alexander Harrington has never smoked. Alexander Harrington has never used smokeless tobacco. Alexander Harrington reports that Alexander Harrington drinks about 3.6 oz of alcohol per week. Alexander Harrington reports that Alexander Harrington uses illicit drugs about once per week.  Outpatient Prescriptions Prior to Visit  Medication Sig Dispense Refill  . levothyroxine (SYNTHROID, LEVOTHROID) 25 MCG tablet TK 1 T PO  QAM ON AN EMPTY STOMACH  0  . propranolol ER (INDERAL LA) 80 MG 24 hr capsule Take 1 capsule (80 mg total) by mouth daily. 30 capsule 3  . REXULTI 2 MG TABS TK 1 T PO  QHS  12  . Vitamin D, Ergocalciferol, (DRISDOL) 50000 UNITS CAPS capsule TK 1 C PO TWICE WEEKLY  12   No facility-administered medications prior to visit.    ROS Review of Systems  Constitutional: Positive for diaphoresis and fatigue. Negative for fever and chills.  HENT: Positive for ear pain, postnasal drip, rhinorrhea, sore throat and voice change. Negative for congestion, dental problem, drooling, ear discharge, facial swelling, hearing loss, mouth sores, nosebleeds, sinus pressure, sneezing, tinnitus and trouble swallowing.   Respiratory: Positive for cough. Negative for wheezing.   Gastrointestinal: Negative for nausea, vomiting and diarrhea.  Skin: Negative for rash.     Objective:  BP 116/88 mmHg  Pulse 107  Temp(Src) 97.8 F (36.6 C) (Oral)  Ht 5\' 10"  (1.778 m)  Wt 224 lb (101.606 kg)  BMI 32.14 kg/m2  SpO2 96%  Physical Exam  Constitutional: Alexander Harrington is oriented to person, place, and time. Alexander Harrington appears well-developed and well-nourished. No distress.  HENT:  Head: Normocephalic and atraumatic.  Right Ear: External ear normal.  Left Ear: External ear normal.  Mouth/Throat: Oropharynx is clear and moist.  TM's clear bilaterally Sounds slightly raspy  Eyes: EOM are normal. Pupils are equal, round, and reactive to light. Right eye exhibits no discharge. Left eye exhibits no discharge. No scleral icterus.  Neck: Normal range of motion. Neck supple. No thyromegaly present.  Cardiovascular: Regular rhythm and normal heart sounds.  Exam reveals no gallop and no friction rub.   No murmur heard. Pulmonary/Chest: Effort normal and breath sounds normal. No respiratory distress. Alexander Harrington has no wheezes. Alexander Harrington has no rales. Alexander Harrington exhibits no tenderness.  Lymphadenopathy:    Alexander Harrington has no cervical adenopathy.  Neurological: Alexander Harrington is alert and oriented to person, place, and time.  Skin: Skin is warm and dry. No rash noted. Alexander Harrington is not diaphoretic.  Psychiatric: Judgment and thought content normal.  Flat affect, hyperactive movements in the exam room   Assessment & Plan:   Alexander Harrington was seen today for laryngitis.  Diagnoses and all orders for this visit:  Viral URI with cough  Other orders -     mometasone (  NASONEX) 50 MCG/ACT nasal spray; Place 2 sprays into the nose daily.  I am having Alexander Harrington start on mometasone. I am also having him maintain his REXULTI, Vitamin D (Ergocalciferol), levothyroxine, and propranolol ER.  Meds ordered this encounter  Medications  . mometasone (NASONEX) 50 MCG/ACT nasal spray    Sig: Place 2 sprays into the nose daily.    Dispense:  17 g    Refill:  11    Order Specific Question:  Supervising Provider    Answer:  Crecencio Mc [[id number]]      Follow-up: Return if symptoms worsen or fail to improve.

## 2015-09-14 NOTE — Progress Notes (Signed)
Pre visit review using our clinic review tool, if applicable. No additional management support is needed unless otherwise documented below in the visit note. 

## 2015-09-16 ENCOUNTER — Other Ambulatory Visit: Payer: Self-pay | Admitting: Nurse Practitioner

## 2015-09-16 MED ORDER — AMOXICILLIN 500 MG PO CAPS: 500.0000 mg | ORAL_CAPSULE | Freq: Two times a day (BID) | ORAL | Status: DC

## 2015-09-23 ENCOUNTER — Encounter: Payer: Self-pay | Admitting: Internal Medicine

## 2015-10-04 ENCOUNTER — Ambulatory Visit (INDEPENDENT_AMBULATORY_CARE_PROVIDER_SITE_OTHER): Payer: BC Managed Care – PPO | Admitting: Internal Medicine

## 2015-10-04 ENCOUNTER — Encounter: Payer: Self-pay | Admitting: Internal Medicine

## 2015-10-04 VITALS — BP 120/72 | HR 102 | Temp 98.2°F | Resp 20 | Wt 224.0 lb

## 2015-10-04 DIAGNOSIS — S41111A Laceration without foreign body of right upper arm, initial encounter: Secondary | ICD-10-CM

## 2015-10-04 DIAGNOSIS — S41119A Laceration without foreign body of unspecified upper arm, initial encounter: Secondary | ICD-10-CM | POA: Insufficient documentation

## 2015-10-04 DIAGNOSIS — R490 Dysphonia: Secondary | ICD-10-CM | POA: Diagnosis not present

## 2015-10-04 DIAGNOSIS — J029 Acute pharyngitis, unspecified: Secondary | ICD-10-CM | POA: Insufficient documentation

## 2015-10-04 MED ORDER — PREDNISONE 10 MG PO TABS: ORAL_TABLET | ORAL | Status: DC

## 2015-10-04 MED ORDER — AZITHROMYCIN 250 MG PO TABS
ORAL_TABLET | ORAL | Status: DC
Start: 2015-10-04 — End: 2015-10-10

## 2015-10-04 NOTE — Assessment & Plan Note (Signed)
Mild to mod, for antibx course,  to f/u any worsening symptoms or concerns 

## 2015-10-04 NOTE — Progress Notes (Signed)
Subjective:    Patient ID: Alexander Harrington, male    DOB: 09-25-1990, 25 y.o.   MRN: [id number]  HPI   Here with 3 wks acute onset fever, facial pressure, headache, general weakness and malaise, with mild ST and non prod cough as well as persistent hoarseness.   but pt denies chest pain, wheezing, increased sob or doe, orthopnea, PND, increased LE swelling, palpitations, dizziness or syncope. Overall not better with nasal steroid and amoxil course.   Pt denies polydipsia, polyuria, Has not had this illness prior.  No sick contacts  Incidentally with an accidental laceration to mild right lower arm, no further bleeding and no s/s cellulitis, but wondering if considered "deep" Past Medical History  Diagnosis Date  . Anxiety     Dr. Toy Care  . Depression   . Bipolar 2 disorder ([place])    No past surgical history on file.  reports that he has never smoked. He has never used smokeless tobacco. He reports that he drinks about 3.6 oz of alcohol per week. He reports that he uses illicit drugs about once per week. family history includes Alcohol abuse in his maternal grandfather and maternal grandmother; Arthritis in his mother; Depression in his mother; Drug abuse in his maternal grandfather and maternal grandmother; Mental illness in his mother. Allergies  Allergen Reactions  . Amphetamines Other (See Comments)    Caused depression that went away after stopping the amphetamine  . Benzodiazepines Other (See Comments)    Got cloudy thinking, loss of memory, suicidal thinking on Klonopin.  . Codeine Rash   Current Outpatient Prescriptions on File Prior to Visit  Medication Sig Dispense Refill  . levothyroxine (SYNTHROID, LEVOTHROID) 25 MCG tablet TK 1 T PO  QAM ON AN EMPTY STOMACH  0  . mometasone (NASONEX) 50 MCG/ACT nasal spray Place 2 sprays into the nose daily. 17 g 11  . propranolol ER (INDERAL LA) 80 MG 24 hr capsule Take 1 capsule (80 mg total) by mouth daily. 30 capsule 3  . REXULTI 2 MG TABS TK  1 T PO  QHS  12  . Vitamin D, Ergocalciferol, (DRISDOL) 50000 UNITS CAPS capsule TK 1 C PO TWICE WEEKLY  12   No current facility-administered medications on file prior to visit.     Review of Systems  Constitutional: Negative for unusual diaphoresis or night sweats HENT: Negative for ringing in ear or discharge Eyes: Negative for double vision or worsening visual disturbance.  Respiratory: Negative for choking and stridor.   Gastrointestinal: Negative for vomiting or other signifcant bowel change Genitourinary: Negative for hematuria or change in urine volume.  Musculoskeletal: Negative for other MSK pain or swelling Skin: Negative for color change and worsening wound.  Neurological: Negative for tremors and numbness other than noted  Psychiatric/Behavioral: Negative for decreased concentration or agitation other than above       Objective:   Physical Exam BP 120/72 mmHg  Pulse 102  Temp(Src) 98.2 F (36.8 C) (Oral)  Resp 20  Wt 224 lb (101.606 kg)  SpO2 97% VS noted, mild ill, fatigued, hoarse to speak Constitutional: Pt appears in no significant distress HENT: Head: NCAT.  Right Ear: External ear normal.  Left Ear: External ear normal.  Bilat tm's with mild erythema.  Max sinus areas non tender.  Pharynx with mild erythema, no exudate Eyes: . Pupils are equal, round, and reactive to light. Conjunctivae and EOM are normal Neck: Normal range of motion. Neck supple.  Cardiovascular: Normal rate and regular  rhythm.   Pulmonary/Chest: Effort normal and breath sounds without rales or wheezing.  Neurological: Pt is alert. Not confused , motor grossly intact Skin: Skin is warm. No rash, no LE edema, left post arm with superficial laceration approx 6 cm below the elbow, approx 1.5 cm long, but 2-3 mm deep, no bleeding or signs of cellulitis, no swelling or redness Psychiatric: Pt behavior is normal. No agitation. dysphoric mood     Assessment & Plan:

## 2015-10-04 NOTE — Assessment & Plan Note (Signed)
Accidental, 24 days old, relatively superficial, no bleeding, no s/s cellulitis, ok for otc triple antibx if worsens, o/w vaseline and bandaid until healed

## 2015-10-04 NOTE — Patient Instructions (Signed)
Please take all new medication as prescribed - the antibiotic, and prednisone  Please continue all other medications as before, and refills have been done if requested.  Please have the pharmacy call with any other refills you may need.  Please keep your appointments with your specialists as you may have planned      

## 2015-10-04 NOTE — Progress Notes (Signed)
Pre visit review using our clinic review tool, if applicable. No additional management support is needed unless otherwise documented below in the visit note. 

## 2015-10-04 NOTE — Assessment & Plan Note (Signed)
Likely related to acute illness, declines cxr, ok for predpac asd,  to f/u any worsening symptoms or concerns

## 2015-10-10 ENCOUNTER — Encounter: Payer: Self-pay | Admitting: Family Medicine

## 2015-10-10 ENCOUNTER — Ambulatory Visit (INDEPENDENT_AMBULATORY_CARE_PROVIDER_SITE_OTHER): Payer: BC Managed Care – PPO | Admitting: Family Medicine

## 2015-10-10 ENCOUNTER — Telehealth: Payer: Self-pay | Admitting: Internal Medicine

## 2015-10-10 VITALS — BP 112/90 | HR 98 | Temp 98.1°F | Ht 70.0 in | Wt 225.2 lb

## 2015-10-10 DIAGNOSIS — R0982 Postnasal drip: Secondary | ICD-10-CM

## 2015-10-10 DIAGNOSIS — R490 Dysphonia: Secondary | ICD-10-CM

## 2015-10-10 MED ORDER — IPRATROPIUM BROMIDE 0.03 % NA SOLN: 2.0000 | Freq: Three times a day (TID) | NASAL | Status: DC

## 2015-10-10 NOTE — Progress Notes (Signed)
[place] at [facility] [address] [place], [place] [address] [phone]  Date:  10/10/2015   Name:  Alexander Harrington   DOB:  04-Apr-1991   MRN:  [id number]  PCP:  Scarlette Calico, MD    Chief Complaint: Laryngitis   History of Present Illness:  Alexander Harrington is a 25 y.o. very pleasant male patient who presents with the following:  Here today with a hoarse voice for one month.  States that he has been seen 3 times for this issue.   A week ago he was given azithromycin and steroids. He feels that he is much improved since his most recent medications use but is not well yet.  He is concerned that this could mean something more serious such as cancer- "I'm kind of a hypochondriac."  No ST, but he does feel like he has PND; he notes drainage down his throat Occasional cough No fever.   He can swallow ok- nothing seems to get stuck in his throat.   Never a smoker He feels like he is a bit swollen under his chin and wonders if this is his glands He also notes some fatigue.   Here with his dad today  History of anxiety, depression- he is treated with inderal LA  Patient Active Problem List   Diagnosis Date Noted  . Acute pharyngitis 10/04/2015  . Hoarseness of voice 10/04/2015  . Arm laceration 10/04/2015  . Viral URI with cough 09/14/2015  . Tachycardia 08/30/2015  . Leukocytosis 02/08/2015  . Other specified hypothyroidism 02/08/2015  . MDD (major depressive disorder), recurrent episode, severe ([place]) 12/01/2013  . Substance induced mood disorder ([place]) 04/11/2012    Class: Acute  . Severe episode of recurrent major depressive disorder ([place]) 04/09/2012    Class: Acute  . Encounter for long-term (current) use of other medications 02/21/2011  . Routine general medical examination at a health care facility 02/21/2011  . Keratosis pilaris 02/21/2011    Past Medical History  Diagnosis Date  . Anxiety     Dr. Toy Care  . Depression    . Bipolar 2 disorder ([place])     No past surgical history on file.  Social History  Substance Use Topics  . Smoking status: Never Smoker   . Smokeless tobacco: Never Used  . Alcohol Use: 3.6 oz/week    6 Cans of beer per week     Comment: occasionally; 2-6 beers    Family History  Problem Relation Age of Onset  . Arthritis Mother   . Mental illness Mother   . Depression Mother   . Drug abuse Maternal Grandfather   . Alcohol abuse Maternal Grandfather   . Drug abuse Maternal Grandmother   . Alcohol abuse Maternal Grandmother     Allergies  Allergen Reactions  . Amphetamines Other (See Comments)    Caused depression that went away after stopping the amphetamine  . Benzodiazepines Other (See Comments)    Got cloudy thinking, loss of memory, suicidal thinking on Klonopin.  . Codeine Rash    Medication list has been reviewed and updated.  Current Outpatient Prescriptions on File Prior to Visit  Medication Sig Dispense Refill  . levothyroxine (SYNTHROID, LEVOTHROID) 25 MCG tablet TK 1 T PO  QAM ON AN EMPTY STOMACH  0  . mometasone (NASONEX) 50 MCG/ACT nasal spray Place 2 sprays into the nose daily. 17 g 11  . propranolol ER (INDERAL LA) 80 MG 24 hr capsule  Take 1 capsule (80 mg total) by mouth daily. 30 capsule 3   No current facility-administered medications on file prior to visit.    Review of Systems:  As per HPI- otherwise negative.   Physical Examination: Filed Vitals:   10/10/15 1012  BP: 112/90  Pulse: 98  Temp: 98.1 F (36.7 C)   Filed Vitals:   10/10/15 1012  Height: 5\' 10"  (1.778 m)  Weight: 225 lb 3.2 oz (102.15 kg)   Body mass index is 32.31 kg/(m^2). Ideal Body Weight: Weight in (lb) to have BMI = 25: 173.9  GEN: WDWN, NAD, Non-toxic, A & O x 3, obese, poorly groomed today HEENT: Atraumatic, Normocephalic. Neck supple. No masses, No LAD.  Bilateral TM wnl, oropharynx normal.  PEERL,EOMI.   Ears and Nose: No external deformity. CV: RRR, No  M/G/R. No JVD. No thrill. No extra heart sounds. PULM: CTA B, no wheezes, crackles, rhonchi. No retractions. No resp. distress. No accessory muscle use. EXTR: No c/c/e NEURO Normal gait.  PSYCH: Normally interactive. Conversant. Not depressed or anxious appearing.  Calm demeanor.    Assessment and Plan: PND (post-nasal drip) - Plan: ipratropium (ATROVENT) 0.03 % nasal spray  Hoarse voice quality - Plan: ipratropium (ATROVENT) 0.03 % nasal spray  Here today with concern about hoarse voice today- he is improving since treatment with pred and azithromycin but is not yet well.  Reassured him that oropharyngeal cancer would be very unlikely in a pt like himself- young, never smoker. Will try atrovent nasal spray for presumed PND.  However he will be sure to let me know if he does not continue to improve and in that case I will refer him to ENT  Signed Lamar Blinks, MD

## 2015-10-10 NOTE — Progress Notes (Signed)
Pre visit review using our clinic review tool, if applicable. No additional management support is needed unless otherwise documented below in the visit note. 

## 2015-10-10 NOTE — Telephone Encounter (Signed)
yes

## 2015-10-10 NOTE — Telephone Encounter (Signed)
LVM advising patient to call and schedule new patient appointment with Dr. Lorelei Pont

## 2015-10-10 NOTE — Telephone Encounter (Signed)
Patient would like to transfer from Dr. Ronnald Ramp to Dr. Lorelei Pont please advise

## 2015-10-10 NOTE — Patient Instructions (Signed)
I do not think that your voice hoarseness means anything dangerous or bad. Try the atrovent spray as needed up to three times a day for drainage and drip If you are not continuing to improve over the next 10- 14 days send me a message and I can refer you to ENT for further evaluation.  However as we discussed throat cancers would be unlikely in a non- smoker of you are!

## 2015-11-15 ENCOUNTER — Telehealth: Payer: Self-pay | Admitting: *Deleted

## 2015-11-15 NOTE — Telephone Encounter (Signed)
Unable to reach patient at time of pre-visit call. Left message for patient to return call when available.  

## 2015-11-16 ENCOUNTER — Other Ambulatory Visit (HOSPITAL_COMMUNITY)
Admission: RE | Admit: 2015-11-16 | Discharge: 2015-11-16 | Disposition: A | Discharge status: Home / Self Care | Payer: BC Managed Care – PPO | Source: Ambulatory Visit | Attending: Family Medicine | Admitting: Family Medicine

## 2015-11-16 ENCOUNTER — Encounter: Payer: Self-pay | Admitting: Family Medicine

## 2015-11-16 ENCOUNTER — Ambulatory Visit (INDEPENDENT_AMBULATORY_CARE_PROVIDER_SITE_OTHER): Payer: BC Managed Care – PPO | Admitting: Family Medicine

## 2015-11-16 VITALS — BP 132/90 | HR 95 | Temp 97.9°F | Ht 70.0 in | Wt 229.0 lb

## 2015-11-16 DIAGNOSIS — Z113 Encounter for screening for infections with a predominantly sexual mode of transmission: Secondary | ICD-10-CM | POA: Insufficient documentation

## 2015-11-16 DIAGNOSIS — R1030 Lower abdominal pain, unspecified: Secondary | ICD-10-CM | POA: Diagnosis not present

## 2015-11-16 LAB — POCT URINALYSIS DIPSTICK
BILIRUBIN UA: NEGATIVE
Blood, UA: NEGATIVE
Glucose, UA: NEGATIVE
Ketones, UA: NEGATIVE
LEUKOCYTES UA: NEGATIVE
NITRITE UA: NEGATIVE
Protein, UA: NEGATIVE
Spec Grav, UA: >=1.03
Urobilinogen, UA: 4
pH, UA: 6

## 2015-11-16 NOTE — Patient Instructions (Addendum)
Your urine looks fine - let me know if your symptoms do not continue to clear up.  Otherwise I think you are doing fine!  I will do an STI screen on your urine as well

## 2015-11-16 NOTE — Progress Notes (Addendum)
[place] at [facility] [address], [place], [place] [address] [phone]  Date:  11/16/2015   Name:  Alexander Harrington   DOB:  Jul 26, 1990   MRN:  [id number]  PCP:  Lamar Blinks, MD    Chief Complaint: Groin Pain   History of Present Illness:  Alexander Harrington is a 25 y.o. very pleasant male patient who presents with the following:  Seen by myself about a month ago with PND.  Here today for a "general check up."  His laryngitis is cleared up now.  He did have some groin/ testicle sx and was seen for this at UC last week.  He reports that there was no real dx for his groin pain but it is now mostly gone.  He thinks it may have been caused by driving to [place] and back in snug jeans.    He does not have dysuria, but notes that "I can feel it a little bit more" when he does urinate.  He has not noted any penile DC.  He would like STI screening just to be on the safe side  Recent labs (february of this year) were normal  Patient Active Problem List   Diagnosis Date Noted  . Hoarseness of voice 10/04/2015  . Tachycardia 08/30/2015  . Leukocytosis 02/08/2015  . Other specified hypothyroidism 02/08/2015  . MDD (major depressive disorder), recurrent episode, severe ([place]) 12/01/2013  . Substance induced mood disorder ([place]) 04/11/2012    Class: Acute  . Severe episode of recurrent major depressive disorder ([place]) 04/09/2012    Class: Acute  . Encounter for long-term (current) use of other medications 02/21/2011  . Routine general medical examination at a health care facility 02/21/2011  . Keratosis pilaris 02/21/2011    Past Medical History  Diagnosis Date  . Anxiety     Dr. Toy Care  . Depression   . Bipolar 2 disorder ([place])     No past surgical history on file.  Social History  Substance Use Topics  . Smoking status: Never Smoker   . Smokeless tobacco: Never Used  . Alcohol Use: 3.6 oz/week    6 Cans of beer per week   Comment: occasionally; 2-6 beers    Family History  Problem Relation Age of Onset  . Arthritis Mother   . Mental illness Mother   . Depression Mother   . Drug abuse Maternal Grandfather   . Alcohol abuse Maternal Grandfather   . Drug abuse Maternal Grandmother   . Alcohol abuse Maternal Grandmother     Allergies  Allergen Reactions  . Amphetamines Other (See Comments)    Caused depression that went away after stopping the amphetamine  . Benzodiazepines Other (See Comments)    Got cloudy thinking, loss of memory, suicidal thinking on Klonopin.  . Codeine Rash    Medication list has been reviewed and updated.  Current Outpatient Prescriptions on File Prior to Visit  Medication Sig Dispense Refill  . ipratropium (ATROVENT) 0.03 % nasal spray Place 2 sprays into the nose 3 (three) times daily. Use as needed 30 mL 6  . levothyroxine (SYNTHROID, LEVOTHROID) 25 MCG tablet TK 1 T PO  QAM ON AN EMPTY STOMACH  0  . mometasone (NASONEX) 50 MCG/ACT nasal spray Place 2 sprays into the nose daily. 17 g 11  . propranolol ER (INDERAL LA) 80 MG 24 hr capsule Take 1 capsule (80 mg total) by mouth daily. 30 capsule 3   No current  facility-administered medications on file prior to visit.    Review of Systems:  As per HPI- otherwise negative.   Physical Examination: Filed Vitals:   11/16/15 1129  BP: 132/90  Pulse: 95  Temp: 97.9 F (36.6 C)   Filed Vitals:   11/16/15 1129  Height: 5\' 10"  (1.778 m)  Weight: 229 lb (103.874 kg)   Body mass index is 32.86 kg/(m^2). Ideal Body Weight: Weight in (lb) to have BMI = 25: 173.9  GEN: WDWN, NAD, Non-toxic, A & O x 3, obese, looks well HEENT: Atraumatic, Normocephalic. Neck supple. No masses, No LAD.  He asks me to check his cervical nodes again which I did- normal exam Ears and Nose: No external deformity. CV: RRR, No M/G/R. No JVD. No thrill. No extra heart sounds. PULM: CTA B, no wheezes, crackles, rhonchi. No retractions. No resp.  distress. No accessory muscle use. ABD: S, NT, ND EXTR: No c/c/e NEURO Normal gait.  PSYCH: Normally interactive. Conversant. Not depressed or anxious appearing.  Calm demeanor.  Gu: normal testicles and penis.  No masses, tenderness, redness, heat or discharge noted   Results for orders placed or performed in visit on 11/16/15  POCT Urinalysis Dipstick  Result Value Ref Range   Color, UA Golden    Clarity, UA Clear    Glucose, UA Neg    Bilirubin, UA Neg    Ketones, UA Neg    Spec Grav, UA >=1.030    Blood, UA Neg    pH, UA 6.0    Protein, UA Neg    Urobilinogen, UA 4.0    Nitrite, UA Neg    Leukocytes, UA Negative Negative    Assessment and Plan: Groin pain, unspecified laterality - Plan: POCT Urinalysis Dipstick, Urine cytology ancillary only  Here today with concern about groin pain-this is now nearly resolved.   Ordered gonorrhea, chlamydia and trich for him today but reassured that his urine appears quite clear.  Sx may have been called by sitting in jeans for many hours  Signed Lamar Blinks, MD  Called and [facility] 4/27- labs are negative.    Results for orders placed or performed in visit on 11/16/15  POCT Urinalysis Dipstick  Result Value Ref Range   Color, UA Golden    Clarity, UA Clear    Glucose, UA Neg    Bilirubin, UA Neg    Ketones, UA Neg    Spec Grav, UA >=1.030    Blood, UA Neg    pH, UA 6.0    Protein, UA Neg    Urobilinogen, UA 4.0    Nitrite, UA Neg    Leukocytes, UA Negative Negative  Urine cytology ancillary only  Result Value Ref Range   Chlamydia Negative    Neisseria gonorrhea Negative    Trichomonas Negative

## 2015-11-16 NOTE — Progress Notes (Signed)
Pre visit review using our clinic tool,if applicable. No additional management support is needed unless otherwise documented below in the visit note.  

## 2015-11-17 LAB — URINE CYTOLOGY ANCILLARY ONLY
Chlamydia: NEGATIVE
NEISSERIA GONORRHEA: NEGATIVE
Trichomonas: NEGATIVE

## 2015-11-21 ENCOUNTER — Ambulatory Visit: Payer: Self-pay | Admitting: Family

## 2015-11-23 ENCOUNTER — Ambulatory Visit: Payer: BC Managed Care – PPO | Admitting: Family Medicine

## 2015-11-23 ENCOUNTER — Telehealth: Payer: Self-pay | Admitting: Family Medicine

## 2015-11-23 NOTE — Telephone Encounter (Signed)
Pt called in at 2:30 to cancel his appt. Pt says that he is no longer having the groin pain that he was having.   Should pt be charged?

## 2015-11-23 NOTE — Telephone Encounter (Signed)
Pt

## 2015-11-24 ENCOUNTER — Encounter: Payer: Self-pay | Admitting: Family Medicine

## 2015-11-24 NOTE — Telephone Encounter (Signed)
Alexander Mclean, MD  Alexander Harrington           No, but please do send him a letter reminding him of the 24 hour cancellation policy and that he will be charged next time. thanks         Sent out copy of office policy to pt.

## 2015-11-24 NOTE — Telephone Encounter (Signed)
Charge or no charge? °

## 2015-11-25 ENCOUNTER — Encounter: Payer: Self-pay | Admitting: Family Medicine

## 2015-11-25 NOTE — Telephone Encounter (Signed)
Waiving fee and mailing reminder letter

## 2015-12-30 ENCOUNTER — Other Ambulatory Visit: Payer: Self-pay | Admitting: Internal Medicine

## 2015-12-30 DIAGNOSIS — F4322 Adjustment disorder with anxiety: Secondary | ICD-10-CM

## 2016-01-18 ENCOUNTER — Ambulatory Visit (INDEPENDENT_AMBULATORY_CARE_PROVIDER_SITE_OTHER): Payer: BC Managed Care – PPO | Admitting: Family Medicine

## 2016-01-18 ENCOUNTER — Encounter (INDEPENDENT_AMBULATORY_CARE_PROVIDER_SITE_OTHER): Payer: Self-pay

## 2016-01-18 VITALS — BP 131/84 | HR 98 | Temp 97.9°F | Ht 70.0 in | Wt 240.0 lb

## 2016-01-18 DIAGNOSIS — F411 Generalized anxiety disorder: Secondary | ICD-10-CM

## 2016-01-18 DIAGNOSIS — N50819 Testicular pain, unspecified: Secondary | ICD-10-CM

## 2016-01-18 DIAGNOSIS — N509 Disorder of male genital organs, unspecified: Secondary | ICD-10-CM | POA: Diagnosis not present

## 2016-01-18 NOTE — Progress Notes (Signed)
Pre visit review using our clinic review tool, if applicable. No additional management support is needed unless otherwise documented below in the visit note. 

## 2016-01-18 NOTE — Progress Notes (Signed)
[place] at [facility] [address], [place], [place] [address] [phone]  Date:  01/18/2016   Name:  Alexander Harrington   DOB:  07/13/1991   MRN:  [id number]  PCP:  Lamar Blinks, MD    Chief Complaint: Groin Pain   History of Present Illness:  Alexander Harrington is a 25 y.o. very pleasant male patient who presents with the following:  I saw this pt last in April at which time he had noted some groin pain after a long drive.  By the time of our visit the sx was really resolved, and he had negative UTI and STI testing.   He states that he has been dx with a somatization disorder by his psychiatrist.  "so I'm not sure what pain is real and what may be just in my head."   He now notes that he has "more upper groin pains that have been more sort of persistent." Admits that he tends to think about this a lot and this seems to make it worse.  States that the pain can be on both sides but is more on the left.  His pains were worse a week ago but better the last couple of days  He also felt like there was a tender area on one of his testicles.  No penile discharge.  No dysuria.   No belly pain, no vomiting or fever He was last sexually active about 4 years ago Office Visit on 11/16/2015  Component Date Value Ref Range Status  . Color, UA 11/16/2015 Golden   Final  . Clarity, UA 11/16/2015 Clear   Final  . Glucose, UA 11/16/2015 Neg   Final  . Bilirubin, UA 11/16/2015 Neg   Final  . Ketones, UA 11/16/2015 Neg   Final  . Spec Grav, UA 11/16/2015 >=1.030   Final  . Blood, UA 11/16/2015 Neg   Final  . pH, UA 11/16/2015 6.0   Final  . Protein, UA 11/16/2015 Neg   Final  . Urobilinogen, UA 11/16/2015 4.0   Final  . Nitrite, UA 11/16/2015 Neg   Final  . Leukocytes, UA 11/16/2015 Negative  Negative Final  . Chlamydia 11/16/2015 Negative   Final   Normal Reference Range - Negative  . Neisseria gonorrhea 11/16/2015 Negative   Final   Normal  Reference Range - Negative  . Trichomonas 11/16/2015 Negative   Final   Normal Reference Range - Negative     Patient Active Problem List   Diagnosis Date Noted  . Hoarseness of voice 10/04/2015  . Tachycardia 08/30/2015  . Leukocytosis 02/08/2015  . Other specified hypothyroidism 02/08/2015  . MDD (major depressive disorder), recurrent episode, severe ([place]) 12/01/2013  . Substance induced mood disorder ([place]) 04/11/2012    Class: Acute  . Severe episode of recurrent major depressive disorder ([place]) 04/09/2012    Class: Acute  . Encounter for long-term (current) use of other medications 02/21/2011  . Routine general medical examination at a health care facility 02/21/2011  . Keratosis pilaris 02/21/2011    Past Medical History  Diagnosis Date  . Anxiety     Dr. [facility]  . Depression   . Bipolar 2 disorder ([place])     No past surgical history on file.  Social History  Substance Use Topics  . Smoking status: Never Smoker   . Smokeless tobacco: Never Used  . Alcohol Use: 3.6 oz/week    6 Cans of beer per week  Comment: occasionally; 2-6 beers    Family History  Problem Relation Age of Onset  . Arthritis Mother   . Mental illness Mother   . Depression Mother   . Drug abuse Maternal Grandfather   . Alcohol abuse Maternal Grandfather   . Drug abuse Maternal Grandmother   . Alcohol abuse Maternal Grandmother     Allergies  Allergen Reactions  . Amphetamines Other (See Comments)    Caused depression that went away after stopping the amphetamine  . Benzodiazepines Other (See Comments)    Got cloudy thinking, loss of memory, suicidal thinking on Klonopin.  . Codeine Rash    Medication list has been reviewed and updated.  Current Outpatient Prescriptions on File Prior to Visit  Medication Sig Dispense Refill  . clonazePAM (KLONOPIN) 1 MG tablet Take 1 mg by mouth 2 (two) times daily.    Marland Kitchen ipratropium (ATROVENT) 0.03 % nasal spray Place 2 sprays into the nose 3  (three) times daily. Use as needed 30 mL 6  . levothyroxine (SYNTHROID, LEVOTHROID) 25 MCG tablet TK 1 T PO  QAM ON AN EMPTY STOMACH  0  . mometasone (NASONEX) 50 MCG/ACT nasal spray Place 2 sprays into the nose daily. 17 g 11  . propranolol ER (INDERAL LA) 80 MG 24 hr capsule TAKE 1 CAPSULE(80 MG) BY MOUTH DAILY 30 capsule 6   No current facility-administered medications on file prior to visit.    Review of Systems:  As per HPI- otherwise negative.    Physical Examination: Filed Vitals:   01/18/16 0941  BP: 136/95  Pulse: 98  Temp: 97.9 F (36.6 C)   Filed Vitals:   01/18/16 0941  Height: 5\' 10"  (1.778 m)  Weight: 240 lb (108.863 kg)   Body mass index is 34.44 kg/(m^2). Ideal Body Weight: Weight in (lb) to have BMI = 25: 173.9  GEN: WDWN, NAD, Non-toxic, A & O x 3, obese, looks well HEENT: Atraumatic, Normocephalic. Neck supple. No masses, No LAD. Ears and Nose: No external deformity. CV: RRR, No M/G/R. No JVD. No thrill. No extra heart sounds. PULM: CTA B, no wheezes, crackles, rhonchi. No retractions. No resp. distress. No accessory muscle use. ABD: S, NT, ND. No rebound. No HSM. No inguinal masses or LAD EXTR: No c/c/e NEURO Normal gait.  PSYCH: Normally interactive. Conversant. Not depressed or anxious appearing.  Calm demeanor.  Normal testicles and penis. No masses or discharge noted.   Assessment and Plan: Testicle tenderness - Plan: US Scrotum  Anxiety state  Here today for another visit for testicular concerns.   I do not see any evidence of disease and recent urine testing was negative.  Suspect that his sx are due to somatization but will check an [place].  He agrees that if this is negative he will likely feel reassured.    Signed Lamar Blinks, MD

## 2016-01-18 NOTE — Patient Instructions (Signed)
I do not see any abnormality of your groin or testicles.  We will arrange an ultrasound to make certain but I do expect it will be normal!  We will be in touch with the details of your ultrasound asap, and then I will contact you with the results.  If this is normal I would suspect that your symptoms are due to anxiety and not to any physical problem or disease

## 2016-01-19 ENCOUNTER — Other Ambulatory Visit: Payer: Self-pay | Admitting: Family Medicine

## 2016-01-19 DIAGNOSIS — N50819 Testicular pain, unspecified: Secondary | ICD-10-CM

## 2016-01-23 ENCOUNTER — Ambulatory Visit (HOSPITAL_BASED_OUTPATIENT_CLINIC_OR_DEPARTMENT_OTHER)
Admission: RE | Admit: 2016-01-23 | Discharge: 2016-01-23 | Disposition: A | Discharge status: Home / Self Care | Payer: BC Managed Care – PPO | Source: Ambulatory Visit | Attending: Family Medicine | Admitting: Family Medicine

## 2016-01-23 DIAGNOSIS — N509 Disorder of male genital organs, unspecified: Secondary | ICD-10-CM | POA: Insufficient documentation

## 2016-01-23 DIAGNOSIS — N50819 Testicular pain, unspecified: Secondary | ICD-10-CM | POA: Diagnosis not present

## 2016-01-23 DIAGNOSIS — I861 Scrotal varices: Secondary | ICD-10-CM | POA: Insufficient documentation

## 2016-01-23 DIAGNOSIS — N503 Cyst of epididymis: Secondary | ICD-10-CM | POA: Diagnosis not present

## 2016-01-25 ENCOUNTER — Ambulatory Visit (HOSPITAL_BASED_OUTPATIENT_CLINIC_OR_DEPARTMENT_OTHER): Admission: RE | Admit: 2016-01-25 | Payer: BC Managed Care – PPO | Source: Ambulatory Visit

## 2016-01-25 ENCOUNTER — Telehealth: Payer: Self-pay | Admitting: Family Medicine

## 2016-01-25 ENCOUNTER — Ambulatory Visit (HOSPITAL_BASED_OUTPATIENT_CLINIC_OR_DEPARTMENT_OTHER): Payer: BC Managed Care – PPO

## 2016-01-25 NOTE — Telephone Encounter (Signed)
Pt called in to request his Ultra sound results. Pt is requesting a call back .   CB: [phone]

## 2016-01-26 NOTE — Telephone Encounter (Signed)
US Scrotum  01/23/2016  CLINICAL DATA:  Region pain on the left for 3 months, progressing EXAM: SCROTAL ULTRASOUND DOPPLER ULTRASOUND OF THE TESTICLES TECHNIQUE: Complete ultrasound examination of the testicles, epididymis, and other scrotal structures was performed. Color and spectral Doppler ultrasound were also utilized to evaluate blood flow to the testicles. COMPARISON:  None. FINDINGS: Right testicle Measurements: 5.2 x 2.3 x 3.1 cm. No mass or microlithiasis visualized. Left testicle Measurements: 4.5 x 2.7 x 3.0 cm. No mass or microlithiasis visualized. Right epididymis: There is a right epididymal head cyst measuring 0.4 x 0.2 x 0.4 cm. Right epididymis otherwise appears normal. Left epididymis:  Normal in size and appearance. Hydrocele:  None visualized. Varicocele: There is a small varicocele on the left. There is no appreciable right-sided varicocele. Pulsed Doppler interrogation of both testes demonstrates normal low resistance arterial and venous waveforms bilaterally. There is no scrotal wall thickening or scrotal abscess. IMPRESSION: Tiny epididymal head cyst on the right. Small left sided varicocele. There is no intratesticular mass or torsion on either side. No inflammatory foci identified on this study. Electronically Signed   By: Lowella Grip III M.D.   On: 01/23/2016 16:13   [place] Art/ven Flow Abd Pelv Doppler  01/23/2016  CLINICAL DATA:  Region pain on the left for 3 months, progressing EXAM: SCROTAL ULTRASOUND DOPPLER ULTRASOUND OF THE TESTICLES TECHNIQUE: Complete ultrasound examination of the testicles, epididymis, and other scrotal structures was performed. Color and spectral Doppler ultrasound were also utilized to evaluate blood flow to the testicles. COMPARISON:  None. FINDINGS: Right testicle Measurements: 5.2 x 2.3 x 3.1 cm. No mass or microlithiasis visualized. Left testicle Measurements: 4.5 x 2.7 x 3.0 cm. No mass or microlithiasis visualized. Right epididymis: There is a right  epididymal head cyst measuring 0.4 x 0.2 x 0.4 cm. Right epididymis otherwise appears normal. Left epididymis:  Normal in size and appearance. Hydrocele:  None visualized. Varicocele: There is a small varicocele on the left. There is no appreciable right-sided varicocele. Pulsed Doppler interrogation of both testes demonstrates normal low resistance arterial and venous waveforms bilaterally. There is no scrotal wall thickening or scrotal abscess. IMPRESSION: Tiny epididymal head cyst on the right. Small left sided varicocele. There is no intratesticular mass or torsion on either side. No inflammatory foci identified on this study. Electronically Signed   By: Lowella Grip III M.D.   On: 01/23/2016 16:13   Called him and LMOM- [place] normal. Will send him a copy on mychart

## 2016-01-30 ENCOUNTER — Other Ambulatory Visit: Payer: Self-pay | Admitting: Internal Medicine

## 2016-08-30 ENCOUNTER — Other Ambulatory Visit: Payer: Self-pay | Admitting: Family Medicine

## 2016-08-30 ENCOUNTER — Other Ambulatory Visit: Payer: Self-pay | Admitting: Emergency Medicine

## 2016-08-30 DIAGNOSIS — F4322 Adjustment disorder with anxiety: Secondary | ICD-10-CM

## 2016-11-15 ENCOUNTER — Ambulatory Visit (INDEPENDENT_AMBULATORY_CARE_PROVIDER_SITE_OTHER): Payer: BC Managed Care – PPO | Admitting: Psychiatry

## 2016-11-15 ENCOUNTER — Encounter: Payer: Self-pay | Admitting: Psychiatry

## 2016-11-15 VITALS — BP 127/85 | HR 114 | Temp 98.1°F | Wt 245.8 lb

## 2016-11-15 DIAGNOSIS — F332 Major depressive disorder, recurrent severe without psychotic features: Secondary | ICD-10-CM | POA: Diagnosis not present

## 2017-03-01 ENCOUNTER — Telehealth: Payer: Self-pay

## 2017-03-06 ENCOUNTER — Other Ambulatory Visit: Payer: Self-pay | Admitting: Family Medicine

## 2017-03-06 DIAGNOSIS — F4322 Adjustment disorder with anxiety: Secondary | ICD-10-CM

## 2017-03-18 ENCOUNTER — Telehealth: Payer: Self-pay | Admitting: Psychiatry

## 2017-03-18 NOTE — Telephone Encounter (Signed)
Spoke with patient's father today. Father is very anxious to have the patient scheduled for ECT. Advised him that just as I had told his son on Friday we need to get the insurance to do the prior authorization. They stated they will try and complete that this evening.

## 2017-03-20 ENCOUNTER — Other Ambulatory Visit: Payer: Self-pay | Admitting: Psychiatry

## 2017-03-20 ENCOUNTER — Other Ambulatory Visit
Admission: RE | Admit: 2017-03-20 | Discharge: 2017-03-20 | Disposition: A | Discharge status: Home / Self Care | Payer: BC Managed Care – PPO | Source: Ambulatory Visit | Attending: Psychiatry | Admitting: Psychiatry

## 2017-03-20 DIAGNOSIS — R200 Anesthesia of skin: Secondary | ICD-10-CM

## 2017-03-20 LAB — COMPREHENSIVE METABOLIC PANEL
ALT: 25 U/L (ref 17–63)
AST: 20 U/L (ref 15–41)
Albumin: 4.7 g/dL (ref 3.5–5.0)
Alkaline Phosphatase: 54 U/L (ref 38–126)
Anion gap: 7 (ref 5–15)
BUN: 12 mg/dL (ref 6–20)
CHLORIDE: 105 mmol/L (ref 101–111)
CO2: 27 mmol/L (ref 22–32)
Calcium: 9.5 mg/dL (ref 8.9–10.3)
Creatinine, Ser: 0.96 mg/dL (ref 0.61–1.24)
Glucose, Bld: 88 mg/dL (ref 65–99)
POTASSIUM: 4.2 mmol/L (ref 3.5–5.1)
SODIUM: 139 mmol/L (ref 135–145)
Total Bilirubin: 0.8 mg/dL (ref 0.3–1.2)
Total Protein: 8.2 g/dL — ABNORMAL HIGH (ref 6.5–8.1)

## 2017-03-20 LAB — CBC
HCT: 47.8 % (ref 40.0–52.0)
Hemoglobin: 16.1 g/dL (ref 13.0–18.0)
MCH: 28.3 pg (ref 26.0–34.0)
MCHC: 33.7 g/dL (ref 32.0–36.0)
MCV: 84 fL (ref 80.0–100.0)
PLATELETS: 389 10*3/uL (ref 150–440)
RBC: 5.7 MIL/uL (ref 4.40–5.90)
RDW: 12.6 % (ref 11.5–14.5)
WBC: 10.5 10*3/uL (ref 3.8–10.6)

## 2017-03-29 ENCOUNTER — Telehealth: Payer: Self-pay

## 2017-04-05 ENCOUNTER — Telehealth (HOSPITAL_COMMUNITY): Payer: Self-pay | Admitting: *Deleted

## 2017-04-05 NOTE — Telephone Encounter (Signed)
Called [employer] at [phone] for prior authorization of ECT. Was told by Ashtine J. That no authorziation is required if done on an outpatient basis. Use her name and date for reference number.

## 2017-04-24 ENCOUNTER — Telehealth: Payer: Self-pay

## 2017-05-07 NOTE — Telephone Encounter (Signed)
ECT: I left a voicemail message for the patient tonight. We should be getting authorization on his insurance soon and then can consider planning for ECT treatment

## 2017-05-07 NOTE — Progress Notes (Signed)
ECT: This is an ECT consultation for evaluation and possible initiation of treatment for this 26 year old man referred by his outpatient psychiatrist. Patient was seen on 11/15/2016 and his father was present also for the interview at the patient's request. Patient reports current symptoms of chronic general anxiety. Frequent worry. Self-consciousness. Also feels down and depressed much of the time. Vague hopelessness. Energy level low. Chronic difficulty with sleep. He denies any psychotic symptoms including any hallucinations. He does not have any acute suicidal intention or plan. Patient is currently seeing his outpatient psychiatrist and is on medication. He has been referred for ECT because of his long history of mood symptoms and failure to have long-standing improvement from treatment so far.  Patient is a neatly dressed and groomed young man cooperative and appropriate with the interview. Eye contact good. Psychomotor activity normal. Speech normal rate tone and volume. Affect slightly anxious but reactive and appropriate. Thoughts lucid no sign of psychosis or disordered thinking. Denies acute suicidal thought or plan to harm himself. Denies any homicidal ideation. Patient is enthusiastically interested in treatment although he currently is enrolled in classes over the summer and feels that it would be better to defer treatment until he can take the time.  Medical history: Patient has no significant chronic medical problems other than his mental health history. Nothing that would interfere with or complicate ECT course.  Substance abuse history: History of use of marijuana. Currently denies any substance abuse denies alcohol abuse. Has not required substance abuse specific treatment in the past.  Social history: Patient lives with his parents. Has a supportive relationship with his family. Currently he is enrolled in classes over the summer.  Past psychiatric history: Patient has been dealing with  symptoms of depression since adolescent years. He has had more than one psychiatric hospitalization and does have a past history of suicide attempts. He has been on multiple medications including Prozac Wellbutrin and his current medications of lamotrigine and Latuda all without sustained benefit. He does have a past history of self injury. He has not had ECT treatment in the past.  This is a 26 year old man with severe recurrent major depression severely impairing with a history of suicide attempts and hospitalizations who has not responded to several medication trials. Patient is potentially an appropriate candidate for ECT. At the time of the interview on April 26 patient was reporting that he was looking forward to his classes over the summer. We agreed that because he was not actively suicidal or psychotic there was not an emergency need to begin treatment. Patient discussed appropriate workup. We will stay in touch and planned to follow-up with likely treatment after the summer. ECT team and utilization review will be notified.

## 2017-05-10 ENCOUNTER — Ambulatory Visit (INDEPENDENT_AMBULATORY_CARE_PROVIDER_SITE_OTHER): Payer: BC Managed Care – PPO | Admitting: Medical

## 2017-05-10 ENCOUNTER — Telehealth: Payer: Self-pay

## 2017-05-10 ENCOUNTER — Ambulatory Visit (HOSPITAL_BASED_OUTPATIENT_CLINIC_OR_DEPARTMENT_OTHER)
Admission: RE | Admit: 2017-05-10 | Discharge: 2017-05-10 | Disposition: A | Discharge status: Home / Self Care | Payer: BC Managed Care – PPO | Source: Ambulatory Visit | Attending: Medical | Admitting: Medical

## 2017-05-10 ENCOUNTER — Encounter: Payer: Self-pay | Admitting: Medical

## 2017-05-10 VITALS — BP 119/84 | HR 82 | Temp 98.6°F | Resp 16 | Ht 70.0 in | Wt 220.8 lb

## 2017-05-10 DIAGNOSIS — F329 Major depressive disorder, single episode, unspecified: Secondary | ICD-10-CM | POA: Diagnosis not present

## 2017-05-10 DIAGNOSIS — F419 Anxiety disorder, unspecified: Secondary | ICD-10-CM | POA: Diagnosis not present

## 2017-05-10 DIAGNOSIS — R519 Headache, unspecified: Secondary | ICD-10-CM

## 2017-05-10 DIAGNOSIS — F32A Depression, unspecified: Secondary | ICD-10-CM

## 2017-05-10 DIAGNOSIS — R51 Headache: Secondary | ICD-10-CM | POA: Insufficient documentation

## 2017-05-10 DIAGNOSIS — R5383 Other fatigue: Secondary | ICD-10-CM | POA: Diagnosis not present

## 2017-05-10 NOTE — Progress Notes (Signed)
Subjective:    Patient ID: Alexander Harrington, male    DOB: March 02, 1991, 26 y.o.   MRN: [id number]  HPI   Pt in for some various symptoms.   Pt states he has on and off soreness back of his rt scalp/side of head area. The pain occurs randomly and intermittently. He often notes pain mostly when he swallow. This has been occurring for about 2 months. But he has pain in his head  recently daily for one week.   Pt last saw [facility] in August or so. Med changes last time did not help. Per pt on last visit had latuda was decreased. Then he started symbyax. Also stopped clonazepam. He states lamictal increased.  Pt thinks his anxiety has worsened. He admits he was not told to stop clonazepam but he did this himself.    Pt also reported some transient intermittent transient sharp pain in head. Mild head yesterday. No severe headache. Some increase of rt side posterior aspect ha at times when he swallows.  Pt thinks ha are not bad but he acknowledges maybe he is worrying too much about this.   Pt is on medication for depression. He states last couple of months poor concentration, indecisiveness and some lethargic.   Currently today no pain on swallowing.  At the end of the interview he express a lot of concern about his recent ha and asked for CT head.      Review of Systems  Constitutional: Positive for fatigue. Negative for chills and fever.       Pt not sure.  Respiratory: Negative for cough, chest tightness and shortness of breath.   Cardiovascular: Negative for chest pain and palpitations.  Gastrointestinal: Negative for abdominal pain.  Musculoskeletal: Negative for back pain.  Skin: Negative for rash.  Neurological: Positive for headaches. Negative for dizziness, syncope, weakness and light-headedness.       He feels occasional heaviness sensation.  See hpi.  Hematological: Negative for adenopathy. Does not bruise/bleed easily.  Psychiatric/Behavioral: Positive for dysphoric mood.  Negative for agitation, behavioral problems, confusion, hallucinations, self-injury and suicidal ideas. The patient is nervous/anxious.        Some apathy.   Past Medical History:  Diagnosis Date  . Anxiety    Dr. [facility]  . Bipolar 2 disorder ([place])   . Depression      Social History   Social History  . Marital status: Single    Spouse name: N/A  . Number of children: N/A  . Years of education: N/A   Occupational History  . student    Social History Main Topics  . Smoking status: Never Smoker  . Smokeless tobacco: Never Used  . Alcohol use 3.0 - 3.6 oz/week    5 - 6 Cans of beer per week     Comment: occasionally; 2-6 beers  . Drug use: Yes    Frequency: 1.0 time per week     Comment: THC this summer, occasional use  . Sexual activity: Not Currently    Birth control/ protection: Condom   Other Topics Concern  . Not on file   Social History Narrative   Caffienated drinks-no   Seat belt use often-yes   Regular Exercise-no   Smoke alarm in the home-yes   Firearms/guns in the home-no   History of physical abuse-no      04/08/2013 AHW Kasandra Knudsen was born in [place], [place], and grew up in [place], [place]. He is an only child, and reports that his childhood  was "really good." He graduated from high school and is currently a senior at [employer] in [employer] studies. He lives off campus with roommates. He denies any legal problems. He reports that he is spiritual but acting not stick. His hobbies include watching movies, reading, and writing. His social support system consists of his father, mother, friends, and cousins. 04/08/2013 AHW             No past surgical history on file.  Family History  Problem Relation Age of Onset  . Arthritis Mother   . Mental illness Mother   . Depression Mother   . Anxiety disorder Mother   . Drug abuse Maternal Grandfather   . Alcohol abuse Maternal Grandfather   . Drug abuse Maternal Grandmother   . Alcohol abuse Maternal  Grandmother     Allergies  Allergen Reactions  . Amphetamines Other (See Comments)    Caused depression that went away after stopping the amphetamine  . Benzodiazepines Other (See Comments)    Got cloudy thinking, loss of memory, suicidal thinking on Klonopin.  . Codeine Rash    Current Outpatient Prescriptions on File Prior to Visit  Medication Sig Dispense Refill  . L-Methylfolate (DEPLIN PO) Take by mouth.    . Vitamin D, Ergocalciferol, (DRISDOL) 50000 units CAPS capsule     . fluticasone (FLONASE) 50 MCG/ACT nasal spray PLACE 2 SPRAYS INTO BOTH NOSTRILS DAILY (Patient not taking: Reported on 05/10/2017) 16 g 1   No current facility-administered medications on file prior to visit.     BP 119/84   Pulse 82   Temp 98.6 F (37 C) (Oral)   Resp 16   Ht 5\' 10"  (1.778 m)   Wt 220 lb 12.8 oz (100.2 kg)   SpO2 100%   BMI 31.68 kg/m       Objective:   Physical Exam  General Mental Status- Alert. General Appearance- Not in acute distress.   Skin General: Color- Normal Color. Moisture- Normal Moisture.  Neck Carotid Arteries- Normal color. Moisture- Normal Moisture. No carotid bruits. No JVD. No trapezius tenderness  Chest and Lung Exam Auscultation: Breath Sounds:-Normal.  Cardiovascular Auscultation:Rythm- Regular. Murmurs & Other Heart Sounds:Auscultation of the heart reveals- No Murmurs.  Abdomen Inspection:-Inspeection Normal. Palpation/Percussion:Note:No mass. Palpation and Percussion of the abdomen reveal- Non Tender, Non Distended + BS, no rebound or guarding.    Neurologic Cranial Nerve exam:- CN III-XII intact(No nystagmus), symmetric smile. Drift Test:- No drift. Romberg Exam:- Negative.  Heal to Toe Gait exam:-Normal. Finger to Nose:- Normal/Intact Strength:- 5/5 equal and symmetric strength both upper and lower extremities.  HEENT Head- Normal. Ear Auditory Canal - Left- Normal. Right - Normal.Tympanic Membrane- Left- Normal. Right-  Normal. Eye Sclera/Conjunctiva- Left- Normal. Right- Normal. Nose & Sinuses Nasal Mucosa- Left- Not  Boggy and Congested. Right-  Not  Boggy and  Congested.Bilateral no  maxillary and no frontal sinus pressure. Mouth & Throat Lips: Upper Lip- Normal: no dryness, cracking, pallor, cyanosis, or vesicular eruption. Lower Lip-Normal: no dryness, cracking, pallor, cyanosis or vesicular eruption. Buccal Mucosa- Bilateral- No Aphthous ulcers. Oropharynx- No Discharge or Erythema. Tonsils: Characteristics- Bilateral- No Erythema or Congestion. Size/Enlargement- Bilateral- No enlargement. Discharge- bilateral-None.        Assessment & Plan:  For your history of depression and anxiety, I want you to continue the medications  psychiatrist prescribed. You mentioned that you stopped clonazepam but this was not advised by her psychiatrist. Your recently anxious about your headaches and I am considering that  your level of stress might be inducing tension type headache. So over the weekend I do want you to resumed the clonazepam as psychiatrist prescribed.  Also want you to call the psychiatrist on Monday and let him know that your mood has been worsening. When you do get through with his office that day  I want you to call our office and notify us. Then we will be able to try to get you in sooner. If you have any severe depression with thoughts of harm to self or others then be seen at emergency department/[facility].  I offered you labs today for fatigue but you declined.If you want to get those in the future please let me know.  You had requested CT for your headaches. I placed a order and it was authorized.  Follow-up with psychiatrist hopefully early next week or recommend trying to see your PCP.  If you can't be seen within 1 week by either then schedule with me.  Breauna Mazzeo, Percell Miller, PA-C

## 2017-05-10 NOTE — Patient Instructions (Addendum)
For your history of depression and anxiety, I want you to continue the medications  psychiatrist prescribed. You mentioned that you stopped clonazepam but this was not advised by her psychiatrist. Your recently anxious about your headaches and I am considering that your level of stress might be inducing tension type headache. So over the weekend I do want you to resumed the clonazepam as psychiatrist prescribed.  Also want you to call the psychiatrist on Monday and let him know that your mood has been worsening. When you do get through with his office that day  I want you to call our office and notify us. Then we will be able to try to get you in sooner. If you have any severe depression with thoughts of harm to self or others then be seen at emergency department/[place].  I offered you labs today for fatigue but you declined.If you want to get those in the future please let me know.  You had requested CT for your headaches. I placed a order and it was authorized.  Follow-up with psychiatrist hopefully early next week oor recommend trying to see your PCP.  If you can't be seen within 1 week by either then schedule with me.

## 2017-05-13 ENCOUNTER — Telehealth: Payer: Self-pay

## 2017-05-20 ENCOUNTER — Encounter
Admission: RE | Admit: 2017-05-20 | Discharge: 2017-05-20 | Disposition: A | Discharge status: Home / Self Care | Payer: BC Managed Care – PPO | Source: Ambulatory Visit | Attending: Psychiatry | Admitting: Psychiatry

## 2017-05-20 ENCOUNTER — Ambulatory Visit
Admission: RE | Admit: 2017-05-20 | Discharge: 2017-05-20 | Disposition: A | Discharge status: Home / Self Care | Payer: BC Managed Care – PPO | Source: Ambulatory Visit | Attending: Psychiatry | Admitting: Psychiatry

## 2017-05-20 ENCOUNTER — Ambulatory Visit: Admission: RE | Admit: 2017-05-20 | Payer: BC Managed Care – PPO | Source: Ambulatory Visit

## 2017-05-20 DIAGNOSIS — Z01810 Encounter for preprocedural cardiovascular examination: Secondary | ICD-10-CM | POA: Insufficient documentation

## 2017-05-20 DIAGNOSIS — Z01812 Encounter for preprocedural laboratory examination: Secondary | ICD-10-CM | POA: Insufficient documentation

## 2017-05-20 DIAGNOSIS — R200 Anesthesia of skin: Secondary | ICD-10-CM | POA: Diagnosis not present

## 2017-05-20 DIAGNOSIS — Z008 Encounter for other general examination: Secondary | ICD-10-CM

## 2017-05-20 LAB — URINALYSIS, COMPLETE (UACMP) WITH MICROSCOPIC
Bacteria, UA: NONE SEEN
Bilirubin Urine: NEGATIVE
Glucose, UA: NEGATIVE mg/dL
Hgb urine dipstick: NEGATIVE
KETONES UR: NEGATIVE mg/dL
Nitrite: NEGATIVE
PH: 6 (ref 5.0–8.0)
PROTEIN: NEGATIVE mg/dL
SQUAMOUS EPITHELIAL / LPF: NONE SEEN
Specific Gravity, Urine: 1.011 (ref 1.005–1.030)

## 2017-05-20 NOTE — Patient Instructions (Signed)
Your procedure is scheduled on: May 27, 2017 Monday AT 8:00 AM Report to Same Day Surgery on the 2nd floor in the [facility].   REMEMBER: Instructions that are not followed completely may result in serious medical risk up to and including death; or upon the discretion of your surgeon and anesthesiologist your surgery may need to be rescheduled.   No Alcohol for 24 hours before or after surgery.  No Smoking for 24 hours prior to surgery.  Notify your doctor if there is any change in your medical condition (cold, fever, infection).  Do not wear jewelry, make-up, hairpins, clips or nail polish.  Do not wear lotions, powders, or perfumes.   Do not shave 48 hours prior to surgery. Men may shave face and neck.  Contacts and dentures may not be worn into surgery.  Do not bring valuables to the hospital. [facility] is not responsible for any belongings or valuables.   TAKE THESE MEDICATIONS THE MORNING OF SURGERY WITH A SIP OF WATER:  None  SEE ECT INSTRUCTIONS SHEET - DONT TAKE LAMICTAL THE NIGHT BEFORE SURGERY  If you are being discharged the day of surgery, you will not be allowed to drive home. You will need someone to drive you home and stay with you that night.   If you are taking public transportation, you will need to have a responsible adult to with you.  Please call the number above if you have any questions about these instructions.

## 2017-05-24 ENCOUNTER — Telehealth (HOSPITAL_COMMUNITY): Payer: Self-pay | Admitting: *Deleted

## 2017-05-24 ENCOUNTER — Other Ambulatory Visit: Payer: Self-pay | Admitting: Psychiatry

## 2017-05-24 NOTE — Telephone Encounter (Signed)
Fax was submitted for outpatient ECT to [facility]. Heather with insurance called to authorize 6 sessions from 05/27/17-07/22/17 with #[id number].

## 2017-05-24 NOTE — Telephone Encounter (Signed)
Filled out paperwork for ECT authorization and faxed to [facility]. Awaiting response.

## 2017-05-27 ENCOUNTER — Encounter: Payer: Self-pay | Admitting: Anesthesiology

## 2017-05-27 ENCOUNTER — Encounter
Admission: RE | Admit: 2017-05-27 | Discharge: 2017-05-27 | Disposition: A | Discharge status: Home / Self Care | Payer: BC Managed Care – PPO | Source: Ambulatory Visit | Attending: Psychiatry | Admitting: Psychiatry

## 2017-05-27 DIAGNOSIS — F3181 Bipolar II disorder: Secondary | ICD-10-CM | POA: Insufficient documentation

## 2017-05-27 DIAGNOSIS — F332 Major depressive disorder, recurrent severe without psychotic features: Secondary | ICD-10-CM | POA: Diagnosis present

## 2017-05-27 DIAGNOSIS — F419 Anxiety disorder, unspecified: Secondary | ICD-10-CM | POA: Diagnosis not present

## 2017-05-27 DIAGNOSIS — Z818 Family history of other mental and behavioral disorders: Secondary | ICD-10-CM | POA: Insufficient documentation

## 2017-05-27 DIAGNOSIS — R51 Headache: Secondary | ICD-10-CM | POA: Insufficient documentation

## 2017-05-27 MED ORDER — METHOHEXITAL SODIUM 100 MG/10ML IV SOSY
PREFILLED_SYRINGE | INTRAVENOUS | Status: DC | PRN
  Administered 2017-05-27: 100 mg via INTRAVENOUS

## 2017-05-27 MED ORDER — SODIUM CHLORIDE 0.9 % IV SOLN
INTRAVENOUS | Status: DC | PRN
  Administered 2017-05-27: 10:00:00 via INTRAVENOUS

## 2017-05-27 MED ORDER — HALOPERIDOL LACTATE 5 MG/ML IJ SOLN
INTRAMUSCULAR | Status: DC | PRN
  Administered 2017-05-27: 5 mg via INTRAVENOUS

## 2017-05-27 MED ORDER — LORAZEPAM 2 MG/ML IJ SOLN
INTRAMUSCULAR | Status: AC
  Administered 2017-05-27: 2 mg via INTRAVENOUS
  Filled 2017-05-27: qty 1

## 2017-05-27 MED ORDER — HALOPERIDOL LACTATE 5 MG/ML IJ SOLN
5.0000 mg | Freq: Once | INTRAMUSCULAR | Status: AC
  Administered 2017-05-27: 5 mg via INTRAVENOUS

## 2017-05-27 MED ORDER — HALOPERIDOL LACTATE 5 MG/ML IJ SOLN
INTRAMUSCULAR | Status: AC
  Filled 2017-05-27: qty 1

## 2017-05-27 MED ORDER — MIDAZOLAM HCL 2 MG/2ML IJ SOLN
INTRAMUSCULAR | Status: DC | PRN
  Administered 2017-05-27: 2 mg via INTRAVENOUS

## 2017-05-27 MED ORDER — LORAZEPAM 2 MG/ML IJ SOLN
2.0000 mg | Freq: Once | INTRAMUSCULAR | Status: AC
  Administered 2017-05-27: 2 mg via INTRAVENOUS

## 2017-05-27 MED ORDER — SODIUM CHLORIDE 0.9 % IV SOLN
500.0000 mL | Freq: Once | INTRAVENOUS | Status: AC
  Administered 2017-05-27: 500 mL via INTRAVENOUS

## 2017-05-27 MED ORDER — SUCCINYLCHOLINE CHLORIDE 20 MG/ML IJ SOLN
INTRAMUSCULAR | Status: DC | PRN
  Administered 2017-05-27: 120 mg via INTRAVENOUS

## 2017-05-27 MED ORDER — HALOPERIDOL LACTATE 5 MG/ML IJ SOLN
INTRAMUSCULAR | Status: AC
  Administered 2017-05-27: 5 mg via INTRAVENOUS
  Filled 2017-05-27: qty 1

## 2017-05-27 MED ORDER — MIDAZOLAM HCL 2 MG/2ML IJ SOLN
INTRAMUSCULAR | Status: AC
  Filled 2017-05-27: qty 2

## 2017-05-27 NOTE — Anesthesia Post-op Follow-up Note (Signed)
Anesthesia QCDR form completed.        

## 2017-05-27 NOTE — Hospital Discharge Follow-Up (Signed)
Spoke with Loman Brooklyn, the father, regarding [place] post procedure. Hamid stated that Gildardo stated he had a severe headache and that he had vomited shortly after getting home. Education was provided regarding administering the patient Tylenol for headache relief. Hamid verbalized understanding. This nurse also stated that I would contact Dr. Weber Cooks regarding administering Toradol and zofran pre treatment to decrease the symptoms he is presently experiencing. Hamid was in agreement with the idea. Hamid stated that Aleczander was not having any additional issue. Appointment was set for Wednesday November 7.

## 2017-05-27 NOTE — H&P (Signed)
Alexander Harrington is an 26 y.o. male.   Chief Complaint: Patient complains of depression fatigue slowed thinking lack of motivation HPI: Major depression severe recurrent  Past Medical History:  Diagnosis Date  . Anxiety    Dr. Toy Care  . Bipolar 2 disorder ([place])   . Depression     History reviewed. No pertinent surgical history.  Family History  Problem Relation Age of Onset  . Arthritis Mother   . Mental illness Mother   . Depression Mother   . Anxiety disorder Mother   . Drug abuse Maternal Grandfather   . Alcohol abuse Maternal Grandfather   . Drug abuse Maternal Grandmother   . Alcohol abuse Maternal Grandmother    Social History:  reports that  has never smoked. he has never used smokeless tobacco. He reports that he drinks about 3.0 - 3.6 oz of alcohol per week. He reports that he uses drugs. Frequency: 1.00 time per week.  Allergies:  Allergies  Allergen Reactions  . Amphetamines Other (See Comments)    Caused depression that went away after stopping the amphetamine  . Benzodiazepines Other (See Comments)    Got cloudy thinking, loss of memory, suicidal thinking on Klonopin.  . Codeine Rash     (Not in a hospital admission)  No results found for this or any previous visit (from the past 48 hour(s)). No results found.  Review of Systems  Constitutional: Negative.   HENT: Negative.   Eyes: Negative.   Respiratory: Negative.   Cardiovascular: Negative.   Gastrointestinal: Negative.   Musculoskeletal: Negative.   Skin: Negative.   Neurological: Negative.   Psychiatric/Behavioral: Positive for depression, memory loss and suicidal ideas. Negative for hallucinations and substance abuse. The patient is nervous/anxious and has insomnia.     Blood pressure (!) 132/94, pulse 89, temperature 98.3 F (36.8 C), temperature source Oral, resp. rate 16, height 5\' 10"  (1.778 m), weight 99.8 kg (220 lb), SpO2 100 %. Physical Exam  Nursing note and vitals  reviewed. Constitutional: He appears well-developed and well-nourished.  HENT:  Head: Normocephalic and atraumatic.  Eyes: Conjunctivae are normal. Pupils are equal, round, and reactive to light.  Neck: Normal range of motion.  Cardiovascular: Regular rhythm and normal heart sounds.  Respiratory: Effort normal. No respiratory distress.  GI: Soft.  Musculoskeletal: Normal range of motion.  Neurological: He is alert.  Skin: Skin is warm and dry.  Psychiatric: Judgment normal. His affect is blunt. His speech is delayed. He is slowed and withdrawn. Cognition and memory are normal. He expresses suicidal ideation. He expresses no suicidal plans.     Assessment/Plan Beginning ECT today.  Patient does not feel that there is significant risk that he is going to act on suicide.  Has positive reasons not to harm himself.  He is agreeable to treatment plan.  Begin ECT today bilateral treatment follow-up 3 times a week schedule going forward.  Alethia Berthold, MD 05/27/2017, 10:12 AM

## 2017-05-27 NOTE — Anesthesia Postprocedure Evaluation (Signed)
Anesthesia Post Note  Patient: Alexander Harrington  Procedure(s) Performed: ECT TX  Patient location during evaluation: PACU Anesthesia Type: General Level of consciousness: awake and alert Pain management: pain level controlled Vital Signs Assessment: post-procedure vital signs reviewed and stable Respiratory status: spontaneous breathing, nonlabored ventilation and respiratory function stable Cardiovascular status: blood pressure returned to baseline and stable Postop Assessment: no signs of nausea or vomiting Anesthetic complications: no     Last Vitals:  Vitals:   05/27/17 1107 05/27/17 1127  BP: 117/83 117/65  Pulse: (!) 113 (!) 112  Resp: (!) 24 18  Temp: 36.6 C 36.9 C  SpO2: 95%     Last Pain:  Vitals:   05/27/17 1127  TempSrc: Oral                 Prerana Strayer

## 2017-05-27 NOTE — Discharge Instructions (Signed)
1)  The drugs that you have been given will stay in your system until tomorrow so for the       next 24 hours you should not: ° A. Drive an automobile ° B. Make any legal decisions ° C. Drink any alcoholic beverages ° °2)  You may resume your regular meals upon return home. ° °3)  A responsible adult must take you home.  Someone should stay with you for a few          hours, then be available by phone for the remainder of the treatment day. ° °4)  You May experience any of the following symptoms: ° Headache, Nausea and a dry mouth (due to the medications you were given),  temporary memory loss and some confusion, or sore muscles (a warm bath  should help this).  If you you experience any of these symptoms let us know on                your return visit. ° °5)  Report any of the following: any acute discomfort, severe headache, or temperature        greater than 100.5 F.   Also report any unusual redness, swelling, drainage, or pain         at your IV site. ° °  You may report Symptoms to:  ECT PROGRAM- [place] [facility] °         Phone: [phone], ECT Department  °         or Dr. Clapac's office [phone] ° °6)  Your next ECT Treatment is Wednesday at 8:30  ° We will call 2 days prior to your scheduled appointment for arrival times. ° °7)  Nothing to eat or drink after midnight the night before your procedure. ° °8)  Take   With a sip of water the morning of your procedure. ° °9)  Other Instructions: Call [phone] to cancel the morning of your procedure due         to illness or emergency. ° °10) We will call within 72 hours to assess how you are feeling.  °

## 2017-05-27 NOTE — Anesthesia Procedure Notes (Signed)
Performed by: Joanna Borawski, CRNA Pre-anesthesia Checklist: Patient identified, Emergency Drugs available, Suction available and Patient being monitored Patient Re-evaluated:Patient Re-evaluated prior to induction Oxygen Delivery Method: Circle system utilized Preoxygenation: Pre-oxygenation with 100% oxygen Induction Type: IV induction Ventilation: Mask ventilation without difficulty and Mask ventilation throughout procedure Airway Equipment and Method: Bite block Placement Confirmation: positive ETCO2 Dental Injury: Teeth and Oropharynx as per pre-operative assessment        

## 2017-05-27 NOTE — Transfer of Care (Signed)
Immediate Anesthesia Transfer of Care Note  Patient: Alexander Harrington  Procedure(s) Performed: ECT TX  Patient Location: PACU  Anesthesia Type:General  Level of Consciousness: awake, pateint uncooperative, confused and responds to stimulation  Airway & Oxygen Therapy: Patient Spontanous Breathing  Post-op Assessment: Report given to RN and Post -op Vital signs reviewed and stable  Post vital signs: Reviewed and stable  Last Vitals:  Vitals:   05/27/17 0804 05/27/17 1032  BP: (!) 132/94 (!) 140/102  Pulse: 89 (!) 114  Resp: 16 17  Temp: 36.8 C   SpO2: 100% 93%    Last Pain:  Vitals:   05/27/17 0804  TempSrc: Oral         Complications: No apparent anesthesia complications

## 2017-05-27 NOTE — Anesthesia Preprocedure Evaluation (Signed)
Anesthesia Evaluation  Patient identified by MRN, date of birth, ID band Patient awake    Reviewed: Allergy & Precautions, NPO status , Patient's Chart, lab work & pertinent test results  History of Anesthesia Complications Negative for: history of anesthetic complications  Airway Mallampati: III  TM Distance: >3 FB Neck ROM: Full    Dental no notable dental hx.    Pulmonary neg pulmonary ROS, neg sleep apnea, neg COPD,    breath sounds clear to auscultation- rhonchi (-) wheezing      Cardiovascular Exercise Tolerance: Good (-) hypertension(-) CAD, (-) Past MI and (-) Cardiac Stents  Rhythm:Regular Rate:Normal - Systolic murmurs and - Diastolic murmurs    Neuro/Psych PSYCHIATRIC DISORDERS Anxiety Depression Bipolar Disorder negative neurological ROS     GI/Hepatic negative GI ROS, Neg liver ROS,   Endo/Other  neg diabetesHypothyroidism   Renal/GU negative Renal ROS     Musculoskeletal negative musculoskeletal ROS (+)   Abdominal (+) + obese,   Peds  Hematology negative hematology ROS (+)   Anesthesia Other Findings Past Medical History: No date: Anxiety     Comment:  Dr. Toy Care No date: Bipolar 2 disorder ([place]) No date: Depression   Reproductive/Obstetrics                             Anesthesia Physical Anesthesia Plan  ASA: II  Anesthesia Plan:    Post-op Pain Management:    Induction: Intravenous  PONV Risk Score and Plan: 1 and Ondansetron  Airway Management Planned: Mask  Additional Equipment:   Intra-op Plan:   Post-operative Plan:   Informed Consent: I have reviewed the patients History and Physical, chart, labs and discussed the procedure including the risks, benefits and alternatives for the proposed anesthesia with the patient or authorized representative who has indicated his/her understanding and acceptance.   Dental advisory given  Plan Discussed with: CRNA  and Anesthesiologist  Anesthesia Plan Comments:         Anesthesia Quick Evaluation

## 2017-05-27 NOTE — Procedures (Signed)
ECT SERVICES Physician's Interval Evaluation & Treatment Note  Patient Identification: Alexander Harrington MRN:  [id number] Date of Evaluation:  05/27/2017 TX #: 1  MADRS: 35  MMSE: 30  P.E. Findings:  No significant physical findings.  Heart and lungs normal.  Vitals unremarkable  Psychiatric Interval Note:  Reports being depressed slowed mentally sluggish hopeless having passive suicidal thoughts with no active intent or plan  Subjective:  Patient is a 26 y.o. male seen for evaluation for Electroconvulsive Therapy. See note above  Treatment Summary:   []   Right Unilateral             [x]  Bilateral   % Energy : 1.0 ms 20%   Impedance: 1710 ohms  Seizure Energy Index: 17,572 V squared  Postictal Suppression Index: 81%  Seizure Concordance Index: 89%  Medications  Pre Shock: Brevital 100 mg succinylcholine 120 mg  Post Shock: Versed 2 mg  Seizure Duration: 52 seconds by EMG 170 seconds by EEG   Comments: Patient had a long seizure.  Versed was given during seizure to help to stop it and was given just before the end of the total seizure.  Patient had more physical movement than would be ideal and I will increase the succinylcholine to 150 mg next time.  Lungs:  [x]   Clear to auscultation               []  Other:   Heart:    [x]   Regular rhythm             []  irregular rhythm    [x]   Previous H&P reviewed, patient examined and there are NO CHANGES                 []   Previous H&P reviewed, patient examined and there are changes noted.   Alethia Berthold, MD 11/5/201810:14 AM

## 2017-05-28 ENCOUNTER — Other Ambulatory Visit: Payer: Self-pay | Admitting: Psychiatry

## 2017-05-29 ENCOUNTER — Encounter: Payer: Self-pay | Admitting: Anesthesiology

## 2017-05-29 ENCOUNTER — Encounter (HOSPITAL_BASED_OUTPATIENT_CLINIC_OR_DEPARTMENT_OTHER)
Admission: RE | Admit: 2017-05-29 | Discharge: 2017-05-29 | Disposition: A | Discharge status: Home / Self Care | Payer: BC Managed Care – PPO | Source: Ambulatory Visit | Attending: Psychiatry | Admitting: Psychiatry

## 2017-05-29 DIAGNOSIS — F332 Major depressive disorder, recurrent severe without psychotic features: Secondary | ICD-10-CM

## 2017-05-29 MED ORDER — METHOHEXITAL SODIUM 100 MG/10ML IV SOSY
PREFILLED_SYRINGE | INTRAVENOUS | Status: DC | PRN
  Administered 2017-05-29: 100 mg via INTRAVENOUS

## 2017-05-29 MED ORDER — SODIUM CHLORIDE 0.9 % IV SOLN
500.0000 mL | Freq: Once | INTRAVENOUS | Status: AC
  Administered 2017-05-29: 500 mL via INTRAVENOUS

## 2017-05-29 MED ORDER — KETOROLAC TROMETHAMINE 30 MG/ML IJ SOLN
INTRAMUSCULAR | Status: AC
  Administered 2017-05-29: 30 mg via INTRAVENOUS
  Filled 2017-05-29: qty 1

## 2017-05-29 MED ORDER — ONDANSETRON HCL 4 MG/2ML IJ SOLN
INTRAMUSCULAR | Status: AC
  Administered 2017-05-29: 4 mg via INTRAVENOUS
  Filled 2017-05-29: qty 2

## 2017-05-29 MED ORDER — SUCCINYLCHOLINE CHLORIDE 20 MG/ML IJ SOLN
INTRAMUSCULAR | Status: AC
  Filled 2017-05-29: qty 1

## 2017-05-29 MED ORDER — SUCCINYLCHOLINE CHLORIDE 200 MG/10ML IV SOSY
PREFILLED_SYRINGE | INTRAVENOUS | Status: DC | PRN
  Administered 2017-05-29: 150 mg via INTRAVENOUS

## 2017-05-29 MED ORDER — KETOROLAC TROMETHAMINE 30 MG/ML IJ SOLN
30.0000 mg | Freq: Once | INTRAMUSCULAR | Status: AC
  Administered 2017-05-29: 30 mg via INTRAVENOUS

## 2017-05-29 MED ORDER — GLYCOPYRROLATE 0.2 MG/ML IJ SOLN
INTRAMUSCULAR | Status: AC
  Administered 2017-05-29: 0.2 mg via INTRAVENOUS
  Filled 2017-05-29: qty 1

## 2017-05-29 MED ORDER — GLYCOPYRROLATE 0.2 MG/ML IJ SOLN
0.2000 mg | Freq: Once | INTRAMUSCULAR | Status: AC
  Administered 2017-05-29: 0.2 mg via INTRAVENOUS

## 2017-05-29 MED ORDER — ONDANSETRON HCL 4 MG/2ML IJ SOLN
4.0000 mg | Freq: Once | INTRAMUSCULAR | Status: AC
  Administered 2017-05-29: 4 mg via INTRAVENOUS

## 2017-05-29 MED ORDER — SODIUM CHLORIDE 0.9 % IV SOLN
INTRAVENOUS | Status: DC | PRN
  Administered 2017-05-29: 09:00:00 via INTRAVENOUS

## 2017-05-29 MED ORDER — MIDAZOLAM HCL 2 MG/2ML IJ SOLN
INTRAMUSCULAR | Status: AC
  Filled 2017-05-29: qty 4

## 2017-05-29 MED ORDER — MIDAZOLAM HCL 2 MG/2ML IJ SOLN
INTRAMUSCULAR | Status: DC | PRN
  Administered 2017-05-29: 4 mg via INTRAVENOUS

## 2017-05-29 MED ORDER — HALOPERIDOL LACTATE 5 MG/ML IJ SOLN
INTRAMUSCULAR | Status: DC | PRN
  Administered 2017-05-29: 5 mg via INTRAVENOUS

## 2017-05-29 MED ORDER — HALOPERIDOL LACTATE 5 MG/ML IJ SOLN
INTRAMUSCULAR | Status: AC
  Filled 2017-05-29: qty 1

## 2017-05-29 NOTE — Discharge Instructions (Signed)
1)  The drugs that you have been given will stay in your system until tomorrow so for the       next 24 hours you should not:  A. Drive an automobile  B. Make any legal decisions  C. Drink any alcoholic beverages  2)  You may resume your regular meals upon return home.  3)  A responsible adult must take you home.  Someone should stay with you for a few          hours, then be available by phone for the remainder of the treatment day.  4)  You May experience any of the following symptoms:  Headache, Nausea and a dry mouth (due to the medications you were given),  temporary memory loss and some confusion, or sore muscles (a warm bath  should help this).  If you you experience any of these symptoms let us know on                your return visit.  5)  Report any of the following: any acute discomfort, severe headache, or temperature        greater than 100.5 F.   Also report any unusual redness, swelling, drainage, or pain         at your IV site.    You may report Symptoms to:  [facility] [place] [facility]          Phone: [phone], ECT Department           or Dr. Prescott Gum office [phone]  6)  Your next ECT Treatment is Day Friday  Date May 31, 2017 We will call 2 days prior to your scheduled appointment for arrival times.  7)  Nothing to eat or drink after midnight the night before your procedure.  8)  Take .   With a sip of water the morning of your procedure.  9)  Other Instructions: Call [phone] to cancel the morning of your procedure due         to illness or emergency.  10) We will call within 72 hours to assess how you are feeling.

## 2017-05-29 NOTE — Transfer of Care (Signed)
Immediate Anesthesia Transfer of Care Note  Patient: Alexander Harrington  Procedure(s) Performed: ECT TX  Patient Location: PACU  Anesthesia Type:General  Level of Consciousness: sedated  Airway & Oxygen Therapy: Patient Spontanous Breathing and Patient connected to face mask oxygen  Post-op Assessment: Report given to RN and Post -op Vital signs reviewed and stable  Post vital signs: Reviewed and stable  Last Vitals:  Vitals:   05/29/17 0833 05/29/17 1100  BP: 139/89   Pulse: 92   Resp: 16   Temp: (!) 36.3 C (P) 36.6 C  SpO2: 100%     Last Pain:  Vitals:   05/29/17 0833  TempSrc: Oral         Complications: No apparent anesthesia complications

## 2017-05-29 NOTE — Anesthesia Postprocedure Evaluation (Signed)
Anesthesia Post Note  Patient: Alexander Harrington  Procedure(s) Performed: ECT TX  Patient location during evaluation: PACU Anesthesia Type: General Level of consciousness: awake and alert Pain management: pain level controlled Vital Signs Assessment: post-procedure vital signs reviewed and stable Respiratory status: spontaneous breathing, nonlabored ventilation and respiratory function stable Cardiovascular status: blood pressure returned to baseline and stable Postop Assessment: no signs of nausea or vomiting Anesthetic complications: no     Last Vitals:  Vitals:   05/29/17 1129 05/29/17 1137  BP: 126/85 (!) 139/97  Pulse: 89 88  Resp: 19 16  Temp:    SpO2: 96%     Last Pain:  Vitals:   05/29/17 1137  TempSrc:   PainSc: 0-No pain                 Bridget Westbrooks

## 2017-05-29 NOTE — H&P (Signed)
Alexander Harrington is an 26 y.o. male.   Chief Complaint: Patient had some nausea and vomiting and significant muscle pain after his last treatment.  Mood possibly slightly improved. HPI: History of recurrent severe depression beginning ECT treatment.  Past Medical History:  Diagnosis Date  . Anxiety    Dr. Toy Care  . Bipolar 2 disorder ([place])   . Depression     History reviewed. No pertinent surgical history.  Family History  Problem Relation Age of Onset  . Arthritis Mother   . Mental illness Mother   . Depression Mother   . Anxiety disorder Mother   . Drug abuse Maternal Grandfather   . Alcohol abuse Maternal Grandfather   . Drug abuse Maternal Grandmother   . Alcohol abuse Maternal Grandmother    Social History:  reports that  has never smoked. he has never used smokeless tobacco. He reports that he drinks about 3.0 - 3.6 oz of alcohol per week. He reports that he uses drugs. Frequency: 1.00 time per week.  Allergies:  Allergies  Allergen Reactions  . Amphetamines Other (See Comments)    Caused depression that went away after stopping the amphetamine  . Benzodiazepines Other (See Comments)    Got cloudy thinking, loss of memory, suicidal thinking on Klonopin.  . Codeine Rash     (Not in a hospital admission)  No results found for this or any previous visit (from the past 48 hour(s)). No results found.  Review of Systems  Constitutional: Negative.   HENT: Negative.   Eyes: Negative.   Respiratory: Negative.   Cardiovascular: Negative.   Gastrointestinal: Positive for nausea.  Musculoskeletal: Negative.   Skin: Negative.   Neurological: Negative.   Psychiatric/Behavioral: Positive for depression. Negative for hallucinations, memory loss, substance abuse and suicidal ideas. The patient is nervous/anxious. The patient does not have insomnia.     Blood pressure 139/89, pulse 92, temperature (!) 97.3 F (36.3 C), temperature source Oral, resp. rate 16, height 5\' 10"   (1.778 m), weight 98.4 kg (217 lb), SpO2 100 %. Physical Exam  Nursing note and vitals reviewed. Constitutional: He appears well-developed and well-nourished.  HENT:  Head: Normocephalic and atraumatic.  Eyes: Conjunctivae are normal. Pupils are equal, round, and reactive to light.  Neck: Normal range of motion.  Cardiovascular: Regular rhythm and normal heart sounds.  Respiratory: Effort normal and breath sounds normal. No respiratory distress.  GI: Soft.  Musculoskeletal: Normal range of motion.  Neurological: He is alert.  Skin: Skin is warm and dry.  Psychiatric: Judgment normal. His speech is delayed. He is slowed. Cognition and memory are normal. He exhibits a depressed mood. He expresses no suicidal ideation.     Assessment/Plan Patient overall seems to have tolerated treatment well.  Reassured him that we will tailor medication to address symptoms.  Continue with 3 times a week treatment.  Alethia Berthold, MD 05/29/2017, 9:42 AM

## 2017-05-29 NOTE — Anesthesia Procedure Notes (Signed)
Date/Time: 05/29/2017 10:49 AM Performed by: Dionne Bucy, CRNA Pre-anesthesia Checklist: Patient identified, Emergency Drugs available, Suction available and Patient being monitored Patient Re-evaluated:Patient Re-evaluated prior to induction Oxygen Delivery Method: Circle system utilized Preoxygenation: Pre-oxygenation with 100% oxygen Induction Type: IV induction Ventilation: Mask ventilation without difficulty and Mask ventilation throughout procedure Airway Equipment and Method: Bite block Placement Confirmation: positive ETCO2 Dental Injury: Teeth and Oropharynx as per pre-operative assessment

## 2017-05-29 NOTE — Anesthesia Preprocedure Evaluation (Addendum)
Anesthesia Evaluation  Patient identified by MRN, date of birth, ID band Patient awake    Reviewed: Allergy & Precautions, NPO status , Patient's Chart, lab work & pertinent test results, reviewed documented beta blocker date and time   History of Anesthesia Complications Negative for: history of anesthetic complications  Airway Mallampati: II  TM Distance: >3 FB     Dental  (+) Chipped   Pulmonary neg pulmonary ROS, neg sleep apnea, neg COPD,    breath sounds clear to auscultation- rhonchi (-) wheezing      Cardiovascular Exercise Tolerance: Good (-) hypertension(-) CAD, (-) Past MI and (-) Cardiac Stents  Rhythm:Regular Rate:Normal - Systolic murmurs and - Diastolic murmurs    Neuro/Psych PSYCHIATRIC DISORDERS Anxiety Depression Bipolar Disorder negative neurological ROS     GI/Hepatic negative GI ROS, Neg liver ROS,   Endo/Other  neg diabetesHypothyroidism   Renal/GU negative Renal ROS     Musculoskeletal negative musculoskeletal ROS (+)   Abdominal (+) + obese,   Peds  Hematology negative hematology ROS (+)   Anesthesia Other Findings Past Medical History: No date: Anxiety     Comment:  Dr. Toy Care No date: Bipolar 2 disorder ([place]) No date: Depression   Reproductive/Obstetrics                                                             Anesthesia Evaluation  Patient identified by MRN, date of birth, ID band Patient awake    Reviewed: Allergy & Precautions, NPO status , Patient's Chart, lab work & pertinent test results  History of Anesthesia Complications Negative for: history of anesthetic complications  Airway Mallampati: III  TM Distance: >3 FB Neck ROM: Full    Dental no notable dental hx.    Pulmonary neg pulmonary ROS, neg sleep apnea, neg COPD,    breath sounds clear to auscultation- rhonchi (-) wheezing      Cardiovascular Exercise Tolerance: Good (-)  hypertension(-) CAD, (-) Past MI and (-) Cardiac Stents  Rhythm:Regular Rate:Normal - Systolic murmurs and - Diastolic murmurs    Neuro/Psych PSYCHIATRIC DISORDERS Anxiety Depression Bipolar Disorder negative neurological ROS     GI/Hepatic negative GI ROS, Neg liver ROS,   Endo/Other  neg diabetesHypothyroidism   Renal/GU negative Renal ROS     Musculoskeletal negative musculoskeletal ROS (+)   Abdominal (+) + obese,   Peds  Hematology negative hematology ROS (+)   Anesthesia Other Findings Past Medical History: No date: Anxiety     Comment:  Dr. Toy Care No date: Bipolar 2 disorder ([place]) No date: Depression   Reproductive/Obstetrics                             Anesthesia Physical Anesthesia Plan  ASA: II  Anesthesia Plan:    Post-op Pain Management:    Induction: Intravenous  PONV Risk Score and Plan: 1 and Ondansetron  Airway Management Planned: Mask  Additional Equipment:   Intra-op Plan:   Post-operative Plan:   Informed Consent: I have reviewed the patients History and Physical, chart, labs and discussed the procedure including the risks, benefits and alternatives for the proposed anesthesia with the patient or authorized representative who has indicated his/her understanding and acceptance.   Dental advisory given  Plan Discussed  with: CRNA and Anesthesiologist  Anesthesia Plan Comments:         Anesthesia Quick Evaluation  Anesthesia Physical Anesthesia Plan  ASA: II  Anesthesia Plan: General   Post-op Pain Management:    Induction: Intravenous  PONV Risk Score and Plan: 2 and Ondansetron  Airway Management Planned: Mask  Additional Equipment:   Intra-op Plan:   Post-operative Plan:   Informed Consent: I have reviewed the patients History and Physical, chart, labs and discussed the procedure including the risks, benefits and alternatives for the proposed anesthesia with the patient or authorized  representative who has indicated his/her understanding and acceptance.     Plan Discussed with: CRNA  Anesthesia Plan Comments:        Anesthesia Quick Evaluation

## 2017-05-29 NOTE — Procedures (Signed)
ECT SERVICES Physician's Interval Evaluation & Treatment Note  Patient Identification: Alexander Harrington MRN:  [id number] Date of Evaluation:  05/29/2017 TX #: 2  MADRS:   MMSE:   P.E. Findings:  No change to physical exam.  Heart and lungs normal vitals unremarkable.  Psychiatric Interval Note:  Mood about the same possibly slightly better.  Subjective:  Patient is a 26 y.o. male seen for evaluation for Electroconvulsive Therapy. Had significant soreness and nausea with last treatment which we will pretreat for.  Treatment Summary:   []   Right Unilateral             []  Bilateral   % Energy : 1.0 ms 20%   Impedance: 790 ohms  Seizure Energy Index: 3028 V squared  Postictal Suppression Index: 58% 84%  Seizure Concordance Index: 84%  Medications  Pre Shock: Robinul 0.2 mg, Zofran 4 mg, Toradol 30 mg, Brevital 100 mg, succinylcholine 150 mg  Post Shock: Versed 4 mg Haldol 5 mg  Seizure Duration: 41 seconds by EMG 41 seconds by EEG.   Comments: Follow-up Friday and into next week  Lungs:  [x]   Clear to auscultation               []  Other:   Heart:    [x]   Regular rhythm             []  irregular rhythm    [x]   Previous H&P reviewed, patient examined and there are NO CHANGES                 []   Previous H&P reviewed, patient examined and there are changes noted.   Alethia Berthold, MD 11/7/201810:42 AM

## 2017-05-29 NOTE — Anesthesia Post-op Follow-up Note (Signed)
Anesthesia QCDR form completed.        

## 2017-05-30 ENCOUNTER — Other Ambulatory Visit: Payer: Self-pay | Admitting: Psychiatry

## 2017-05-31 ENCOUNTER — Encounter (HOSPITAL_BASED_OUTPATIENT_CLINIC_OR_DEPARTMENT_OTHER)
Admission: RE | Admit: 2017-05-31 | Discharge: 2017-05-31 | Disposition: A | Discharge status: Home / Self Care | Payer: BC Managed Care – PPO | Source: Ambulatory Visit | Attending: Psychiatry | Admitting: Psychiatry

## 2017-05-31 ENCOUNTER — Encounter: Payer: Self-pay | Admitting: Anesthesiology

## 2017-05-31 DIAGNOSIS — F332 Major depressive disorder, recurrent severe without psychotic features: Secondary | ICD-10-CM

## 2017-05-31 MED ORDER — METHOHEXITAL SODIUM 100 MG/10ML IV SOSY
PREFILLED_SYRINGE | INTRAVENOUS | Status: DC | PRN
  Administered 2017-05-31: 100 mg via INTRAVENOUS

## 2017-05-31 MED ORDER — HALOPERIDOL LACTATE 5 MG/ML IJ SOLN
INTRAMUSCULAR | Status: AC
  Filled 2017-05-31: qty 1

## 2017-05-31 MED ORDER — SODIUM CHLORIDE 0.9 % IV SOLN
500.0000 mL | Freq: Once | INTRAVENOUS | Status: AC
  Administered 2017-05-31: 500 mL via INTRAVENOUS

## 2017-05-31 MED ORDER — KETOROLAC TROMETHAMINE 30 MG/ML IJ SOLN
INTRAMUSCULAR | Status: AC
  Filled 2017-05-31: qty 1

## 2017-05-31 MED ORDER — GLYCOPYRROLATE 0.2 MG/ML IJ SOLN
0.1000 mg | Freq: Once | INTRAMUSCULAR | Status: AC
  Administered 2017-05-31: 0.1 mg via INTRAVENOUS

## 2017-05-31 MED ORDER — MIDAZOLAM HCL 2 MG/2ML IJ SOLN
4.0000 mg | Freq: Once | INTRAMUSCULAR | Status: AC
  Administered 2017-05-31: 4 mg via INTRAVENOUS

## 2017-05-31 MED ORDER — GLYCOPYRROLATE 0.2 MG/ML IJ SOLN
0.3000 mg | Freq: Once | INTRAMUSCULAR | Status: AC
  Administered 2017-05-31: 0.2 mg via INTRAVENOUS

## 2017-05-31 MED ORDER — HALOPERIDOL LACTATE 5 MG/ML IJ SOLN
INTRAMUSCULAR | Status: DC | PRN
  Administered 2017-05-31: 5 mg via INTRAVENOUS

## 2017-05-31 MED ORDER — ONDANSETRON HCL 4 MG/2ML IJ SOLN
INTRAMUSCULAR | Status: AC
  Administered 2017-05-31: 4 mg via INTRAVENOUS
  Filled 2017-05-31: qty 2

## 2017-05-31 MED ORDER — SODIUM CHLORIDE 0.9 % IV SOLN
INTRAVENOUS | Status: DC | PRN
  Administered 2017-05-31: 10:00:00 via INTRAVENOUS

## 2017-05-31 MED ORDER — SUCCINYLCHOLINE CHLORIDE 200 MG/10ML IV SOSY
PREFILLED_SYRINGE | INTRAVENOUS | Status: DC | PRN
  Administered 2017-05-31: 150 mg via INTRAVENOUS

## 2017-05-31 MED ORDER — ONDANSETRON HCL 4 MG/2ML IJ SOLN
4.0000 mg | Freq: Once | INTRAMUSCULAR | Status: AC
  Administered 2017-05-31: 4 mg via INTRAVENOUS

## 2017-05-31 MED ORDER — GLYCOPYRROLATE 0.2 MG/ML IJ SOLN
INTRAMUSCULAR | Status: AC
  Administered 2017-05-31: 0.2 mg via INTRAVENOUS
  Filled 2017-05-31: qty 2

## 2017-05-31 MED ORDER — KETOROLAC TROMETHAMINE 30 MG/ML IJ SOLN
30.0000 mg | Freq: Once | INTRAMUSCULAR | Status: AC
  Administered 2017-05-31: 30 mg via INTRAVENOUS

## 2017-05-31 MED ORDER — MIDAZOLAM HCL 2 MG/2ML IJ SOLN
INTRAMUSCULAR | Status: AC
  Filled 2017-05-31: qty 4

## 2017-05-31 NOTE — Procedures (Signed)
ECT SERVICES Physician's Interval Evaluation & Treatment Note  Patient Identification: Alexander Harrington MRN:  [id number] Date of Evaluation:  05/31/2017 TX #: 3  MADRS:   MMSE:   P.E. Findings:  No change to physical exam  Psychiatric Interval Note:  Mood reported slightly better denies suicidal ideation  Subjective:  Patient is a 26 y.o. male seen for evaluation for Electroconvulsive Therapy. Some soreness and headache  Treatment Summary:   []   Right Unilateral             [x]  Bilateral   % Energy : 1.0 ms 20%   Impedance: 1200 oh rooms  Seizure Energy Index: 4266 V squared  Postictal Suppression Index: 63%  Seizure Concordance Index: 95%  Medications  Pre Shock: Robinul 0.3 mg Zofran 4 mg Toradol 30 mg Brevital 100 mg succinylcholine 150 mg  Post Shock: Versed 4 mg Haldol 5 mg  Seizure Duration: EMG 45 seconds EEG 78 seconds   Comments: Follow-up for next week  Lungs:  [x]   Clear to auscultation               []  Other:   Heart:    [x]   Regular rhythm             []  irregular rhythm    [x]   Previous H&P reviewed, patient examined and there are NO CHANGES                 []   Previous H&P reviewed, patient examined and there are changes noted.   Alethia Berthold, MD 11/9/201811:08 AM

## 2017-05-31 NOTE — Anesthesia Postprocedure Evaluation (Signed)
Anesthesia Post Note  Patient: Alexander Harrington  Procedure(s) Performed: ECT TX  Patient location during evaluation: PACU Anesthesia Type: General Level of consciousness: awake and alert Pain management: pain level controlled Vital Signs Assessment: post-procedure vital signs reviewed and stable Respiratory status: spontaneous breathing, nonlabored ventilation, respiratory function stable and patient connected to nasal cannula oxygen Cardiovascular status: blood pressure returned to baseline and stable Postop Assessment: no apparent nausea or vomiting Anesthetic complications: no     Last Vitals:  Vitals:   05/31/17 1135 05/31/17 1200  BP:    Pulse: (!) 116 90  Resp:  16  Temp: 36.5 C 36.7 C  SpO2:      Last Pain:  Vitals:   05/31/17 1200  TempSrc: Oral                 Martha Clan

## 2017-05-31 NOTE — OR Nursing (Signed)
Patient is clm at this time able to answer some questions

## 2017-05-31 NOTE — Anesthesia Post-op Follow-up Note (Signed)
Anesthesia QCDR form completed.        

## 2017-05-31 NOTE — Transfer of Care (Signed)
Immediate Anesthesia Transfer of Care Note  Patient: Alexander Harrington  Procedure(s) Performed: ECT TX  Patient Location: PACU  Anesthesia Type:General  Level of Consciousness: sedated  Airway & Oxygen Therapy: Patient Spontanous Breathing and Patient connected to face mask oxygen  Post-op Assessment: Report given to RN and Post -op Vital signs reviewed and stable  Post vital signs: Reviewed and stable  Last Vitals:  Vitals:   05/31/17 0851  BP: (!) 149/87  Pulse: 89  Resp: 18  Temp: 37 C  SpO2: 99%    Last Pain:  Vitals:   05/31/17 0851  TempSrc: Oral         Complications: No apparent anesthesia complications

## 2017-05-31 NOTE — H&P (Signed)
Alexander Harrington is an 26 y.o. male.   Chief Complaint: Still depressed although probably some improvement no current suicidal ideation HPI: Recurrent severe depression unresponsive to medication  Past Medical History:  Diagnosis Date  . Anxiety    Dr. Toy Care  . Bipolar 2 disorder ([place])   . Depression     History reviewed. No pertinent surgical history.  Family History  Problem Relation Age of Onset  . Arthritis Mother   . Mental illness Mother   . Depression Mother   . Anxiety disorder Mother   . Drug abuse Maternal Grandfather   . Alcohol abuse Maternal Grandfather   . Drug abuse Maternal Grandmother   . Alcohol abuse Maternal Grandmother    Social History:  reports that  has never smoked. he has never used smokeless tobacco. He reports that he drinks about 3.0 - 3.6 oz of alcohol per week. He reports that he uses drugs. Frequency: 1.00 time per week.  Allergies:  Allergies  Allergen Reactions  . Amphetamines Other (See Comments)    Caused depression that went away after stopping the amphetamine  . Benzodiazepines Other (See Comments)    Got cloudy thinking, loss of memory, suicidal thinking on Klonopin.  . Codeine Rash     (Not in a hospital admission)  No results found for this or any previous visit (from the past 48 hour(s)). No results found.  Review of Systems  Constitutional: Negative.   HENT: Negative.   Eyes: Negative.   Respiratory: Negative.   Cardiovascular: Negative.   Gastrointestinal: Negative.   Musculoskeletal: Positive for myalgias.  Skin: Negative.   Neurological: Negative.   Psychiatric/Behavioral: Positive for depression. Negative for hallucinations, memory loss, substance abuse and suicidal ideas. The patient is not nervous/anxious and does not have insomnia.     Blood pressure (!) 149/87, pulse 89, temperature 98.6 F (37 C), temperature source Oral, resp. rate 18, weight 100.7 kg (222 lb), SpO2 99 %. Physical Exam  Nursing note and vitals  reviewed. Constitutional: He appears well-developed and well-nourished.  HENT:  Head: Normocephalic and atraumatic.  Eyes: Conjunctivae are normal. Pupils are equal, round, and reactive to light.  Neck: Normal range of motion.  Cardiovascular: Regular rhythm and normal heart sounds.  Respiratory: Effort normal and breath sounds normal. No respiratory distress.  GI: Soft.  Musculoskeletal: Normal range of motion.  Neurological: He is alert.  Skin: Skin is warm and dry.  Psychiatric: Judgment normal. His affect is blunt. His speech is delayed. He is slowed. He expresses no homicidal and no suicidal ideation. He exhibits abnormal recent memory.     Assessment/Plan Continue index course through next week  Alethia Berthold, MD 05/31/2017, 11:06 AM

## 2017-05-31 NOTE — Discharge Instructions (Signed)
1)  The drugs that you have been given will stay in your system until tomorrow so for the       next 24 hours you should not:  A. Drive an automobile  B. Make any legal decisions  C. Drink any alcoholic beverages  2)  You may resume your regular meals upon return home.  3)  A responsible adult must take you home.  Someone should stay with you for a few          hours, then be available by phone for the remainder of the treatment day.  4)  You May experience any of the following symptoms:  Headache, Nausea and a dry mouth (due to the medications you were given),  temporary memory loss and some confusion, or sore muscles (a warm bath  should help this).  If you you experience any of these symptoms let us know on                your return visit.  5)  Report any of the following: any acute discomfort, severe headache, or temperature        greater than 100.5 F.   Also report any unusual redness, swelling, drainage, or pain         at your IV site.    You may report Symptoms to:  [place] [facility]          Phone: [phone], ECT Department           or Dr. Prescott Gum office [phone]  6)  Your next ECT Treatment is Day Monday Date June 03, 2017  We will call 2 days prior to your scheduled appointment for arrival times.  7)  Nothing to eat or drink after midnight the night before your procedure.  8)  Take .    With a sip of water the morning of your procedure.  9)  Other Instructions: Call [phone] to cancel the morning of your procedure due         to illness or emergency.  10) We will call within 72 hours to assess how you are feeling.

## 2017-05-31 NOTE — Anesthesia Preprocedure Evaluation (Signed)
Anesthesia Evaluation  Patient identified by MRN, date of birth, ID band Patient awake    Reviewed: Allergy & Precautions, NPO status , Patient's Chart, lab work & pertinent test results, reviewed documented beta blocker date and time   History of Anesthesia Complications Negative for: history of anesthetic complications  Airway Mallampati: II  TM Distance: >3 FB     Dental  (+) Chipped   Pulmonary neg pulmonary ROS, neg sleep apnea, neg COPD,    breath sounds clear to auscultation- rhonchi (-) wheezing      Cardiovascular Exercise Tolerance: Good (-) hypertension(-) CAD, (-) Past MI and (-) Cardiac Stents  Rhythm:Regular Rate:Normal - Systolic murmurs and - Diastolic murmurs    Neuro/Psych PSYCHIATRIC DISORDERS negative neurological ROS     GI/Hepatic negative GI ROS, Neg liver ROS,   Endo/Other  neg diabetesHypothyroidism   Renal/GU negative Renal ROS     Musculoskeletal negative musculoskeletal ROS (+)   Abdominal (+) + obese,   Peds  Hematology negative hematology ROS (+)   Anesthesia Other Findings Past Medical History: No date: Anxiety     Comment:  Dr. Toy Care No date: Bipolar 2 disorder ([place]) No date: Depression   Reproductive/Obstetrics                                                              Anesthesia Evaluation  Patient identified by MRN, date of birth, ID band Patient awake    Reviewed: Allergy & Precautions, NPO status , Patient's Chart, lab work & pertinent test results  History of Anesthesia Complications Negative for: history of anesthetic complications  Airway Mallampati: III  TM Distance: >3 FB Neck ROM: Full    Dental no notable dental hx.    Pulmonary neg pulmonary ROS, neg sleep apnea, neg COPD,    breath sounds clear to auscultation- rhonchi (-) wheezing      Cardiovascular Exercise Tolerance: Good (-) hypertension(-) CAD, (-) Past MI  and (-) Cardiac Stents  Rhythm:Regular Rate:Normal - Systolic murmurs and - Diastolic murmurs    Neuro/Psych PSYCHIATRIC DISORDERS Anxiety Depression Bipolar Disorder negative neurological ROS     GI/Hepatic negative GI ROS, Neg liver ROS,   Endo/Other  neg diabetesHypothyroidism   Renal/GU negative Renal ROS     Musculoskeletal negative musculoskeletal ROS (+)   Abdominal (+) + obese,   Peds  Hematology negative hematology ROS (+)   Anesthesia Other Findings Past Medical History: No date: Anxiety     Comment:  Dr. Toy Care No date: Bipolar 2 disorder ([place]) No date: Depression   Reproductive/Obstetrics                             Anesthesia Physical Anesthesia Plan  ASA: II  Anesthesia Plan:    Post-op Pain Management:    Induction: Intravenous  PONV Risk Score and Plan: 1 and Ondansetron  Airway Management Planned: Mask  Additional Equipment:   Intra-op Plan:   Post-operative Plan:   Informed Consent: I have reviewed the patients History and Physical, chart, labs and discussed the procedure including the risks, benefits and alternatives for the proposed anesthesia with the patient or authorized representative who has indicated his/her understanding and acceptance.   Dental advisory given  Plan Discussed with: CRNA and  Anesthesiologist  Anesthesia Plan Comments:         Anesthesia Quick Evaluation  Anesthesia Physical  Anesthesia Plan  ASA: II  Anesthesia Plan: General   Post-op Pain Management:    Induction: Intravenous  PONV Risk Score and Plan: 2 and Ondansetron  Airway Management Planned: Mask  Additional Equipment:   Intra-op Plan:   Post-operative Plan:   Informed Consent: I have reviewed the patients History and Physical, chart, labs and discussed the procedure including the risks, benefits and alternatives for the proposed anesthesia with the patient or authorized representative who has indicated  his/her understanding and acceptance.     Plan Discussed with: CRNA  Anesthesia Plan Comments:         Anesthesia Quick Evaluation

## 2017-05-31 NOTE — Anesthesia Procedure Notes (Signed)
Date/Time: 05/31/2017 11:12 AM Performed by: Dionne Bucy, CRNA Pre-anesthesia Checklist: Patient identified, Emergency Drugs available, Suction available and Patient being monitored Patient Re-evaluated:Patient Re-evaluated prior to induction Oxygen Delivery Method: Circle system utilized Preoxygenation: Pre-oxygenation with 100% oxygen Induction Type: IV induction Ventilation: Mask ventilation without difficulty and Mask ventilation throughout procedure Airway Equipment and Method: Bite block Placement Confirmation: positive ETCO2 Dental Injury: Teeth and Oropharynx as per pre-operative assessment

## 2017-06-02 ENCOUNTER — Other Ambulatory Visit: Payer: Self-pay | Admitting: Psychiatry

## 2017-06-03 ENCOUNTER — Encounter: Payer: Self-pay | Admitting: Registered Nurse

## 2017-06-03 ENCOUNTER — Encounter (HOSPITAL_BASED_OUTPATIENT_CLINIC_OR_DEPARTMENT_OTHER)
Admission: RE | Admit: 2017-06-03 | Discharge: 2017-06-03 | Disposition: A | Discharge status: Home / Self Care | Payer: BC Managed Care – PPO | Source: Ambulatory Visit | Attending: Psychiatry | Admitting: Psychiatry

## 2017-06-03 DIAGNOSIS — F332 Major depressive disorder, recurrent severe without psychotic features: Secondary | ICD-10-CM | POA: Diagnosis not present

## 2017-06-03 MED ORDER — ONDANSETRON HCL 4 MG/2ML IJ SOLN
INTRAMUSCULAR | Status: AC
  Administered 2017-06-03: 4 mg via INTRAVENOUS
  Filled 2017-06-03: qty 2

## 2017-06-03 MED ORDER — SODIUM CHLORIDE 0.9 % IV SOLN
500.0000 mL | Freq: Once | INTRAVENOUS | Status: AC
  Administered 2017-06-03: 500 mL via INTRAVENOUS

## 2017-06-03 MED ORDER — MIDAZOLAM HCL 2 MG/2ML IJ SOLN
INTRAMUSCULAR | Status: DC | PRN
  Administered 2017-06-03: 4 mg via INTRAVENOUS

## 2017-06-03 MED ORDER — SUCCINYLCHOLINE CHLORIDE 20 MG/ML IJ SOLN
INTRAMUSCULAR | Status: AC
  Filled 2017-06-03: qty 1

## 2017-06-03 MED ORDER — KETOROLAC TROMETHAMINE 30 MG/ML IJ SOLN
INTRAMUSCULAR | Status: AC
  Administered 2017-06-03: 30 mg via INTRAVENOUS
  Filled 2017-06-03: qty 1

## 2017-06-03 MED ORDER — HALOPERIDOL LACTATE 5 MG/ML IJ SOLN
INTRAMUSCULAR | Status: DC | PRN
  Administered 2017-06-03: 5 mg via INTRAVENOUS

## 2017-06-03 MED ORDER — MIDAZOLAM HCL 2 MG/2ML IJ SOLN
INTRAMUSCULAR | Status: AC
  Filled 2017-06-03: qty 4

## 2017-06-03 MED ORDER — KETOROLAC TROMETHAMINE 30 MG/ML IJ SOLN
30.0000 mg | Freq: Once | INTRAMUSCULAR | Status: AC
  Administered 2017-06-03: 30 mg via INTRAVENOUS

## 2017-06-03 MED ORDER — HALOPERIDOL LACTATE 5 MG/ML IJ SOLN
INTRAMUSCULAR | Status: AC
  Filled 2017-06-03: qty 1

## 2017-06-03 MED ORDER — MIDAZOLAM HCL 2 MG/2ML IJ SOLN: 4.0000 mg | Freq: Once | INTRAMUSCULAR | Status: DC

## 2017-06-03 MED ORDER — METHOHEXITAL SODIUM 100 MG/10ML IV SOSY
PREFILLED_SYRINGE | INTRAVENOUS | Status: DC | PRN
  Administered 2017-06-03: 100 mg via INTRAVENOUS

## 2017-06-03 MED ORDER — GLYCOPYRROLATE 0.2 MG/ML IJ SOLN
INTRAMUSCULAR | Status: AC
  Administered 2017-06-03: 0.3 mg via INTRAVENOUS
  Filled 2017-06-03: qty 2

## 2017-06-03 MED ORDER — ONDANSETRON HCL 4 MG/2ML IJ SOLN
4.0000 mg | Freq: Once | INTRAMUSCULAR | Status: AC
  Administered 2017-06-03: 4 mg via INTRAVENOUS

## 2017-06-03 MED ORDER — SODIUM CHLORIDE 0.9 % IV SOLN
INTRAVENOUS | Status: DC | PRN
  Administered 2017-06-03: 10:00:00 via INTRAVENOUS

## 2017-06-03 MED ORDER — GLYCOPYRROLATE 0.2 MG/ML IJ SOLN
0.3000 mg | Freq: Once | INTRAMUSCULAR | Status: AC
  Administered 2017-06-03: 0.3 mg via INTRAVENOUS

## 2017-06-03 MED ORDER — SUCCINYLCHOLINE CHLORIDE 20 MG/ML IJ SOLN
INTRAMUSCULAR | Status: DC | PRN
  Administered 2017-06-03: 150 mg via INTRAVENOUS

## 2017-06-03 NOTE — H&P (Signed)
Alexander Harrington is an 26 y.o. male.   Chief Complaint: Noticing memory impairment but mood slightly better HPI: History of recurrent severe depression beginning index course ECT  Past Medical History:  Diagnosis Date  . Anxiety    Dr. Toy Care  . Bipolar 2 disorder ([place])   . Depression     History reviewed. No pertinent surgical history.  Family History  Problem Relation Age of Onset  . Arthritis Mother   . Mental illness Mother   . Depression Mother   . Anxiety disorder Mother   . Drug abuse Maternal Grandfather   . Alcohol abuse Maternal Grandfather   . Drug abuse Maternal Grandmother   . Alcohol abuse Maternal Grandmother    Social History:  reports that  has never smoked. he has never used smokeless tobacco. He reports that he drinks about 3.0 - 3.6 oz of alcohol per week. He reports that he uses drugs. Frequency: 1.00 time per week.  Allergies:  Allergies  Allergen Reactions  . Amphetamines Other (See Comments)    Caused depression that went away after stopping the amphetamine  . Benzodiazepines Other (See Comments)    Got cloudy thinking, loss of memory, suicidal thinking on Klonopin.  . Codeine Rash     (Not in a hospital admission)  No results found for this or any previous visit (from the past 48 hour(s)). No results found.  Review of Systems  Constitutional: Negative.   HENT: Negative.   Eyes: Negative.   Respiratory: Negative.   Cardiovascular: Negative.   Gastrointestinal: Negative.   Musculoskeletal: Negative.   Skin: Negative.   Neurological: Negative.   Psychiatric/Behavioral: Positive for memory loss. Negative for depression, hallucinations, substance abuse and suicidal ideas. The patient is not nervous/anxious and does not have insomnia.     Blood pressure (P) 130/87, pulse 91, temperature 98.5 F (36.9 C), temperature source Oral, resp. rate 18, height 5\' 10"  (1.778 m), weight 101.2 kg (223 lb), SpO2 99 %. Physical Exam  Nursing note and vitals  reviewed. Constitutional: He appears well-developed and well-nourished.  HENT:  Head: Normocephalic and atraumatic.  Eyes: Conjunctivae are normal. Pupils are equal, round, and reactive to light.  Neck: Normal range of motion.  Cardiovascular: Regular rhythm and normal heart sounds.  Respiratory: Effort normal. No respiratory distress.  GI: Soft.  Musculoskeletal: Normal range of motion.  Neurological: He is alert.  Skin: Skin is warm and dry.  Psychiatric: Judgment normal. His affect is blunt. His speech is delayed. He is slowed. He expresses no homicidal and no suicidal ideation. He exhibits abnormal recent memory.     Assessment/Plan Today is treatment number for and he appears to be showing some benefit.  Plan to continue with index course while monitoring for risk and benefit  Alethia Berthold, MD 06/03/2017, 10:05 AM

## 2017-06-03 NOTE — Anesthesia Procedure Notes (Signed)
Performed by: Hedda Slade, CRNA Pre-anesthesia Checklist: Patient identified, Emergency Drugs available, Suction available and Patient being monitored Patient Re-evaluated:Patient Re-evaluated prior to induction Oxygen Delivery Method: Circle system utilized Preoxygenation: Pre-oxygenation with 100% oxygen Induction Type: IV induction Ventilation: Mask ventilation without difficulty Airway Equipment and Method: Bite block

## 2017-06-03 NOTE — Procedures (Signed)
ECT SERVICES Physician's Interval Evaluation & Treatment Note  Patient Identification: Alexander Harrington MRN:  [id number] Date of Evaluation:  06/03/2017 TX #: 4  MADRS: 23  MMSE: 30  P.E. Findings:  No change to physical exam heart and lungs normal vitals no  Psychiatric Interval Note:  Mood somewhat improved.  Some memory problems noticed  Subjective:  Patient is a 26 y.o. male seen for evaluation for Electroconvulsive Therapy. Feeling a little better  Treatment Summary:   []   Right Unilateral             [x]  Bilateral   % Energy : 1.0 ms 20%   Impedance: 900 ohms  Seizure Energy Index:    Postictal Suppression Index:    Seizure Concordance Index:    Medications  Pre Shock: Robinul 0.3 mg Zofran 4 mg Toradol 30 mg Brevital 100 mg succinylcholine 150 mg  Post Shock: Versed 4 mg Haldol 5  Seizure Duration: 26 seconds by EMG 48 seconds by EEG   Comments: Follow-up Wednesday and Friday  Lungs:  [x]   Clear to auscultation               []  Other:   Heart:    [x]   Regular rhythm             []  irregular rhythm    [x]   Previous H&P reviewed, patient examined and there are NO CHANGES                 []   Previous H&P reviewed, patient examined and there are changes noted.   Alethia Berthold, MD 11/12/201810:07 AM

## 2017-06-03 NOTE — Transfer of Care (Signed)
Immediate Anesthesia Transfer of Care Note  Patient: Alexander Harrington  Procedure(s) Performed: ECT TX  Patient Location: PACU  Anesthesia Type:General  Level of Consciousness: awake, alert  and oriented  Airway & Oxygen Therapy: Patient Spontanous Breathing and Patient connected to face mask oxygen  Post-op Assessment: Report given to RN and Post -op Vital signs reviewed and stable  Post vital signs: Reviewed and stable  Last Vitals:  Vitals:   06/03/17 0839 06/03/17 1025  BP: (P) 130/87 123/84  Pulse:  (!) 102  Resp:  (!) 21  Temp:  36.7 C  SpO2:  96%    Last Pain:  Vitals:   06/03/17 0816  TempSrc: Oral         Complications: No apparent anesthesia complications

## 2017-06-03 NOTE — Anesthesia Postprocedure Evaluation (Signed)
Anesthesia Post Note  Patient: Alexander Harrington  Procedure(s) Performed: ECT TX  Patient location during evaluation: PACU Anesthesia Type: General Level of consciousness: awake and alert Pain management: pain level controlled Vital Signs Assessment: post-procedure vital signs reviewed and stable Respiratory status: spontaneous breathing, nonlabored ventilation and respiratory function stable Cardiovascular status: blood pressure returned to baseline and stable Postop Assessment: no signs of nausea or vomiting Anesthetic complications: no     Last Vitals:  Vitals:   06/03/17 1055 06/03/17 1103  BP: 117/88 (!) 120/97  Pulse: 99 91  Resp: 16 16  Temp:    SpO2: 96%     Last Pain:  Vitals:   06/03/17 0816  TempSrc: Oral                 Deaundra Dupriest

## 2017-06-03 NOTE — Anesthesia Preprocedure Evaluation (Signed)
Anesthesia Evaluation  Patient identified by MRN, date of birth, ID band Patient awake    Reviewed: Allergy & Precautions, NPO status , Patient's Chart, lab work & pertinent test results  History of Anesthesia Complications Negative for: history of anesthetic complications  Airway Mallampati: III  TM Distance: >3 FB Neck ROM: Full    Dental no notable dental hx.    Pulmonary neg pulmonary ROS, neg sleep apnea, neg COPD,    breath sounds clear to auscultation- rhonchi (-) wheezing      Cardiovascular Exercise Tolerance: Good (-) hypertension(-) CAD, (-) Past MI and (-) Cardiac Stents  Rhythm:Regular Rate:Normal - Systolic murmurs and - Diastolic murmurs    Neuro/Psych PSYCHIATRIC DISORDERS Anxiety Depression Bipolar Disorder negative neurological ROS     GI/Hepatic negative GI ROS, Neg liver ROS,   Endo/Other  neg diabetesHypothyroidism   Renal/GU negative Renal ROS     Musculoskeletal negative musculoskeletal ROS (+)   Abdominal (+) + obese,   Peds  Hematology negative hematology ROS (+)   Anesthesia Other Findings Past Medical History: No date: Anxiety     Comment:  Dr. Toy Care No date: Bipolar 2 disorder ([place]) No date: Depression   Reproductive/Obstetrics                             Anesthesia Physical  Anesthesia Plan  ASA: II  Anesthesia Plan:    Post-op Pain Management:    Induction: Intravenous  PONV Risk Score and Plan: 1 and Ondansetron  Airway Management Planned: Mask  Additional Equipment:   Intra-op Plan:   Post-operative Plan:   Informed Consent: I have reviewed the patients History and Physical, chart, labs and discussed the procedure including the risks, benefits and alternatives for the proposed anesthesia with the patient or authorized representative who has indicated his/her understanding and acceptance.   Dental advisory given  Plan Discussed with:  CRNA and Anesthesiologist  Anesthesia Plan Comments:         Anesthesia Quick Evaluation

## 2017-06-03 NOTE — Discharge Instructions (Signed)
1)  The drugs that you have been given will stay in your system until tomorrow so for the       next 24 hours you should not:  A. Drive an automobile  B. Make any legal decisions  C. Drink any alcoholic beverages  2)  You may resume your regular meals upon return home.  3)  A responsible adult must take you home.  Someone should stay with you for a few          hours, then be available by phone for the remainder of the treatment day.  4)  You May experience any of the following symptoms:  Headache, Nausea and a dry mouth (due to the medications you were given),  temporary memory loss and some confusion, or sore muscles (a warm bath  should help this).  If you you experience any of these symptoms let us know on                your return visit.  5)  Report any of the following: any acute discomfort, severe headache, or temperature        greater than 100.5 F.   Also report any unusual redness, swelling, drainage, or pain         at your IV site.    You may report Symptoms to:  [place] at [facility]          Phone: [phone], ECT Department           or Dr. Prescott Gum office [phone]  6)  Your next ECT Treatment is Day Wednesday  Date June 05, 2017 at 8am  We will call 2 days prior to your scheduled appointment for arrival times.  7)  Nothing to eat or drink after midnight the night before your procedure.  8)  Take .   With a sip of water the morning of your procedure.  9)  Other Instructions: Call [phone] to cancel the morning of your procedure due         to illness or emergency.  10) We will call within 72 hours to assess how you are feeling.

## 2017-06-03 NOTE — Anesthesia Post-op Follow-up Note (Signed)
Anesthesia QCDR form completed.        

## 2017-06-04 ENCOUNTER — Other Ambulatory Visit: Payer: Self-pay | Admitting: Psychiatry

## 2017-06-05 ENCOUNTER — Telehealth: Payer: Self-pay | Admitting: *Deleted

## 2017-06-05 ENCOUNTER — Telehealth: Payer: Self-pay

## 2017-06-05 ENCOUNTER — Encounter (HOSPITAL_BASED_OUTPATIENT_CLINIC_OR_DEPARTMENT_OTHER)
Admission: RE | Admit: 2017-06-05 | Discharge: 2017-06-05 | Disposition: A | Discharge status: Home / Self Care | Payer: BC Managed Care – PPO | Source: Ambulatory Visit | Attending: Psychiatry | Admitting: Psychiatry

## 2017-06-05 ENCOUNTER — Encounter: Payer: Self-pay | Admitting: Anesthesiology

## 2017-06-05 DIAGNOSIS — F332 Major depressive disorder, recurrent severe without psychotic features: Secondary | ICD-10-CM

## 2017-06-05 MED ORDER — HALOPERIDOL LACTATE 5 MG/ML IJ SOLN
INTRAMUSCULAR | Status: DC | PRN
  Administered 2017-06-05: 5 mg via INTRAVENOUS

## 2017-06-05 MED ORDER — LACTATED RINGERS IV SOLN
INTRAVENOUS | Status: DC | PRN
  Administered 2017-06-05 (×2): via INTRAVENOUS

## 2017-06-05 MED ORDER — METHOHEXITAL SODIUM 0.5 G IJ SOLR
INTRAMUSCULAR | Status: AC
  Filled 2017-06-05: qty 500

## 2017-06-05 MED ORDER — GLYCOPYRROLATE 0.2 MG/ML IJ SOLN
0.3000 mg | Freq: Once | INTRAMUSCULAR | Status: AC
  Administered 2017-06-05: 0.3 mg via INTRAVENOUS

## 2017-06-05 MED ORDER — FENTANYL CITRATE (PF) 100 MCG/2ML IJ SOLN: 25.0000 ug | INTRAMUSCULAR | Status: DC | PRN

## 2017-06-05 MED ORDER — METHOHEXITAL SODIUM 100 MG/10ML IV SOSY
PREFILLED_SYRINGE | INTRAVENOUS | Status: DC | PRN
  Administered 2017-06-05: 100 mg via INTRAVENOUS

## 2017-06-05 MED ORDER — KETOROLAC TROMETHAMINE 30 MG/ML IJ SOLN
INTRAMUSCULAR | Status: AC
  Filled 2017-06-05: qty 1

## 2017-06-05 MED ORDER — KETOROLAC TROMETHAMINE 30 MG/ML IJ SOLN
30.0000 mg | Freq: Once | INTRAMUSCULAR | Status: AC
  Administered 2017-06-05: 30 mg via INTRAVENOUS

## 2017-06-05 MED ORDER — GLYCOPYRROLATE 0.2 MG/ML IJ SOLN
INTRAMUSCULAR | Status: AC
  Filled 2017-06-05: qty 2

## 2017-06-05 MED ORDER — SUCCINYLCHOLINE CHLORIDE 200 MG/10ML IV SOSY
PREFILLED_SYRINGE | INTRAVENOUS | Status: DC | PRN
  Administered 2017-06-05: 150 mg via INTRAVENOUS

## 2017-06-05 MED ORDER — ONDANSETRON HCL 4 MG/2ML IJ SOLN: 4.0000 mg | Freq: Once | INTRAMUSCULAR | Status: DC | PRN

## 2017-06-05 MED ORDER — SODIUM CHLORIDE 0.9 % IV SOLN
500.0000 mL | Freq: Once | INTRAVENOUS | Status: AC
  Administered 2017-06-05: 500 mL via INTRAVENOUS

## 2017-06-05 MED ORDER — MIDAZOLAM HCL 2 MG/2ML IJ SOLN
4.0000 mg | Freq: Once | INTRAMUSCULAR | Status: AC
  Administered 2017-06-05: 4 mg via INTRAVENOUS

## 2017-06-05 MED ORDER — ONDANSETRON HCL 4 MG/2ML IJ SOLN
INTRAMUSCULAR | Status: AC
  Filled 2017-06-05: qty 2

## 2017-06-05 MED ORDER — SUCCINYLCHOLINE CHLORIDE 20 MG/ML IJ SOLN
INTRAMUSCULAR | Status: AC
  Filled 2017-06-05: qty 1

## 2017-06-05 MED ORDER — MIDAZOLAM HCL 2 MG/2ML IJ SOLN
INTRAMUSCULAR | Status: AC
  Filled 2017-06-05: qty 4

## 2017-06-05 MED ORDER — ONDANSETRON HCL 4 MG/2ML IJ SOLN
4.0000 mg | Freq: Once | INTRAMUSCULAR | Status: AC
  Administered 2017-06-05: 4 mg via INTRAVENOUS

## 2017-06-05 NOTE — Anesthesia Postprocedure Evaluation (Signed)
Anesthesia Post Note  Patient: Alexander Harrington  Procedure(s) Performed: ECT TX  Patient location during evaluation: PACU Anesthesia Type: General Level of consciousness: awake and alert and oriented Pain management: pain level controlled Vital Signs Assessment: post-procedure vital signs reviewed and stable Respiratory status: spontaneous breathing Cardiovascular status: blood pressure returned to baseline Anesthetic complications: no     Last Vitals:  Vitals:   06/05/17 1115 06/05/17 1127  BP: (!) 126/92   Pulse: (!) 103 91  Resp: 12 16  Temp:  36.9 C  SpO2: 98%     Last Pain:  Vitals:   06/05/17 1406  TempSrc:   PainSc: 5                  Rohil Lesch

## 2017-06-05 NOTE — Procedures (Signed)
ECT SERVICES Physician's Interval Evaluation & Treatment Note  Patient Identification: Alexander Harrington MRN:  [id number] Date of Evaluation:  06/05/2017 TX #: 5  MADRS:   MMSE:   P.E. Findings:  No change to physical exam vitals normal heart and lungs normal.  Psychiatric Interval Note:  Affect euthymic improved  Subjective:  Patient is a 26 y.o. male seen for evaluation for Electroconvulsive Therapy. Memory impairment  Treatment Summary:   []   Right Unilateral             [x]  Bilateral   % Energy : 1.0 ms 20%   Impedance: 1340 ohms  Seizure Energy Index: 9,105 V squared  Postictal Suppression Index: 51%  Seizure Concordance Index: 96%  Medications  Pre Shock: Robinul 0.3 mg Zofran 4 mg Toradol 30 mg Brevital 100 mg succinylcholine 150 mg  Post Shock: Versed 4 mg Haldol 5 mg  Seizure Duration: 59 seconds by EMG 166 seconds by EEG   Comments: Probably getting close to maximizing benefit reassess on Friday  Lungs:  [x]   Clear to auscultation               []  Other:   Heart:    [x]   Regular rhythm             []  irregular rhythm    [x]   Previous H&P reviewed, patient examined and there are NO CHANGES                 []   Previous H&P reviewed, patient examined and there are changes noted.   Alethia Berthold, MD 11/14/201810:17 AM

## 2017-06-05 NOTE — H&P (Signed)
Alexander Harrington is an 26 y.o. male.   Chief Complaint: Mood is improved unclear if there is been many improvements just since Monday.  Continuing to have memory impairment. HPI: Severe recurrent major depression showing some response to ECT  Past Medical History:  Diagnosis Date  . Anxiety    Dr. Toy Care  . Bipolar 2 disorder ([place])   . Depression     History reviewed. No pertinent surgical history.  Family History  Problem Relation Age of Onset  . Arthritis Mother   . Mental illness Mother   . Depression Mother   . Anxiety disorder Mother   . Drug abuse Maternal Grandfather   . Alcohol abuse Maternal Grandfather   . Drug abuse Maternal Grandmother   . Alcohol abuse Maternal Grandmother    Social History:  reports that  has never smoked. he has never used smokeless tobacco. He reports that he drinks about 3.0 - 3.6 oz of alcohol per week. He reports that he uses drugs. Frequency: 1.00 time per week.  Allergies:  Allergies  Allergen Reactions  . Amphetamines Other (See Comments)    Caused depression that went away after stopping the amphetamine  . Benzodiazepines Other (See Comments)    Got cloudy thinking, loss of memory, suicidal thinking on Klonopin.  . Codeine Rash     (Not in a hospital admission)  No results found for this or any previous visit (from the past 48 hour(s)). No results found.  Review of Systems  Constitutional: Negative.   HENT: Negative.   Eyes: Negative.   Respiratory: Negative.   Cardiovascular: Negative.   Gastrointestinal: Negative.   Musculoskeletal: Negative.   Skin: Negative.   Neurological: Negative.   Psychiatric/Behavioral: Positive for memory loss. Negative for depression, hallucinations, substance abuse and suicidal ideas. The patient is not nervous/anxious and does not have insomnia.     Blood pressure 124/86, pulse 72, resp. rate 16, height 5\' 10"  (1.778 m), weight 101.2 kg (223 lb), SpO2 100 %. Physical Exam  Nursing note and  vitals reviewed. Constitutional: He appears well-developed and well-nourished.  HENT:  Head: Normocephalic and atraumatic.  Eyes: Conjunctivae are normal. Pupils are equal, round, and reactive to light.  Neck: Normal range of motion.  Cardiovascular: Regular rhythm and normal heart sounds.  Respiratory: Effort normal. No respiratory distress.  GI: Soft.  Musculoskeletal: Normal range of motion.  Neurological: He is alert.  Skin: Skin is warm and dry.  Psychiatric: He has a normal mood and affect. His behavior is normal. Judgment and thought content normal.     Assessment/Plan Possibly getting towards the point of plateauing.  Treatment today and follow-up on Friday to reassess that we will be treatment #6.  Alethia Berthold, MD 06/05/2017, 10:15 AM

## 2017-06-05 NOTE — Discharge Instructions (Signed)
1)  The drugs that you have been given will stay in your system until tomorrow so for the       next 24 hours you should not:  A. Drive an automobile  B. Make any legal decisions  C. Drink any alcoholic beverages  2)  You may resume your regular meals upon return home.  3)  A responsible adult must take you home.  Someone should stay with you for a few          hours, then be available by phone for the remainder of the treatment day.  4)  You May experience any of the following symptoms:  Headache, Nausea and a dry mouth (due to the medications you were given),  temporary memory loss and some confusion, or sore muscles (a warm bath  should help this).  If you you experience any of these symptoms let us know on                your return visit.  5)  Report any of the following: any acute discomfort, severe headache, or temperature        greater than 100.5 F.   Also report any unusual redness, swelling, drainage, or pain         at your IV site.    You may report Symptoms to:  [place] [facility]          Phone: [phone], ECT Department           or Dr. Prescott Gum office [phone]  6)  Your next ECT Treatment is Friday June 07, 2017  We will call 2 days prior to your scheduled appointment for arrival times.  7)  Nothing to eat or drink after midnight the night before your procedure.  8)  Take      With a sip of water the morning of your procedure.  9)  Other Instructions: Call [phone] to cancel the morning of your procedure due         to illness or emergency.  10) We will call within 72 hours to assess how you are feeling.

## 2017-06-05 NOTE — Anesthesia Preprocedure Evaluation (Signed)
Anesthesia Evaluation  Patient identified by MRN, date of birth, ID band Patient awake    Reviewed: Allergy & Precautions, NPO status , Patient's Chart, lab work & pertinent test results  History of Anesthesia Complications Negative for: history of anesthetic complications  Airway Mallampati: III  TM Distance: >3 FB Neck ROM: Full    Dental no notable dental hx.    Pulmonary neg pulmonary ROS, neg sleep apnea, neg COPD,    breath sounds clear to auscultation- rhonchi (-) wheezing      Cardiovascular Exercise Tolerance: Good (-) hypertension(-) CAD, (-) Past MI and (-) Cardiac Stents negative cardio ROS   Rhythm:Regular Rate:Normal - Systolic murmurs and - Diastolic murmurs    Neuro/Psych PSYCHIATRIC DISORDERS Anxiety Depression Bipolar Disorder negative neurological ROS     GI/Hepatic negative GI ROS, Neg liver ROS,   Endo/Other  neg diabetesHypothyroidism   Renal/GU negative Renal ROS  negative genitourinary   Musculoskeletal negative musculoskeletal ROS (+)   Abdominal (+) + obese,   Peds negative pediatric ROS (+)  Hematology negative hematology ROS (+)   Anesthesia Other Findings Past Medical History: No date: Anxiety     Comment:  Dr. Toy Care No date: Bipolar 2 disorder ([place]) No date: Depression   Reproductive/Obstetrics                             Anesthesia Physical  Anesthesia Plan  ASA: II  Anesthesia Plan:    Post-op Pain Management:    Induction: Intravenous  PONV Risk Score and Plan: 1 and Ondansetron  Airway Management Planned: Mask  Additional Equipment:   Intra-op Plan:   Post-operative Plan:   Informed Consent: I have reviewed the patients History and Physical, chart, labs and discussed the procedure including the risks, benefits and alternatives for the proposed anesthesia with the patient or authorized representative who has indicated his/her  understanding and acceptance.   Dental advisory given  Plan Discussed with: CRNA and Anesthesiologist  Anesthesia Plan Comments:         Anesthesia Quick Evaluation

## 2017-06-05 NOTE — Anesthesia Post-op Follow-up Note (Signed)
Anesthesia QCDR form completed.        

## 2017-06-05 NOTE — Anesthesia Procedure Notes (Signed)
Date/Time: 06/05/2017 10:22 AM Performed by: Dionne Bucy, CRNA Pre-anesthesia Checklist: Patient identified, Emergency Drugs available, Suction available and Patient being monitored Patient Re-evaluated:Patient Re-evaluated prior to induction Oxygen Delivery Method: Circle system utilized Preoxygenation: Pre-oxygenation with 100% oxygen Induction Type: IV induction Ventilation: Mask ventilation without difficulty and Mask ventilation throughout procedure Airway Equipment and Method: Bite block Placement Confirmation: positive ETCO2 Dental Injury: Teeth and Oropharynx as per pre-operative assessment

## 2017-06-05 NOTE — Transfer of Care (Signed)
Immediate Anesthesia Transfer of Care Note  Patient: Alexander Harrington  Procedure(s) Performed: ECT TX  Patient Location: PACU  Anesthesia Type:General  Level of Consciousness: sedated  Airway & Oxygen Therapy: Patient Spontanous Breathing and Patient connected to face mask oxygen  Post-op Assessment: Report given to RN and Post -op Vital signs reviewed and stable  Post vital signs: Reviewed and stable  Last Vitals:  Vitals:   06/05/17 0828 06/05/17 1035  BP: 124/86   Pulse: 72 (!) 110  Resp: 16 (!) 24  Temp:  36.9 C  SpO2: 100% 98%    Last Pain:  Vitals:   06/05/17 1035  TempSrc:   PainSc: 0-No pain         Complications: No apparent anesthesia complications

## 2017-06-06 ENCOUNTER — Other Ambulatory Visit: Payer: Self-pay | Admitting: Psychiatry

## 2017-06-07 ENCOUNTER — Encounter: Payer: Self-pay | Admitting: Registered Nurse

## 2017-06-07 ENCOUNTER — Encounter (HOSPITAL_BASED_OUTPATIENT_CLINIC_OR_DEPARTMENT_OTHER)
Admission: RE | Admit: 2017-06-07 | Discharge: 2017-06-07 | Disposition: A | Discharge status: Home / Self Care | Payer: BC Managed Care – PPO | Source: Ambulatory Visit | Attending: Psychiatry | Admitting: Psychiatry

## 2017-06-07 DIAGNOSIS — F332 Major depressive disorder, recurrent severe without psychotic features: Secondary | ICD-10-CM | POA: Diagnosis not present

## 2017-06-07 MED ORDER — SODIUM CHLORIDE 0.9 % IV SOLN
INTRAVENOUS | Status: DC | PRN
  Administered 2017-06-07: 10:00:00 via INTRAVENOUS

## 2017-06-07 MED ORDER — MIDAZOLAM HCL 2 MG/2ML IJ SOLN
INTRAMUSCULAR | Status: DC | PRN
  Administered 2017-06-07: 4 mg via INTRAVENOUS

## 2017-06-07 MED ORDER — ONDANSETRON HCL 4 MG/2ML IJ SOLN
4.0000 mg | Freq: Once | INTRAMUSCULAR | Status: AC
  Administered 2017-06-07: 4 mg via INTRAVENOUS

## 2017-06-07 MED ORDER — HALOPERIDOL LACTATE 5 MG/ML IJ SOLN
INTRAMUSCULAR | Status: DC | PRN
  Administered 2017-06-07: 5 mg via INTRAVENOUS

## 2017-06-07 MED ORDER — MIDAZOLAM HCL 2 MG/2ML IJ SOLN
INTRAMUSCULAR | Status: AC
  Filled 2017-06-07: qty 4

## 2017-06-07 MED ORDER — METHOHEXITAL SODIUM 0.5 G IJ SOLR
INTRAMUSCULAR | Status: AC
  Filled 2017-06-07: qty 500

## 2017-06-07 MED ORDER — ONDANSETRON HCL 4 MG/2ML IJ SOLN
INTRAMUSCULAR | Status: AC
  Administered 2017-06-07: 4 mg via INTRAVENOUS
  Filled 2017-06-07: qty 2

## 2017-06-07 MED ORDER — KETOROLAC TROMETHAMINE 30 MG/ML IJ SOLN
INTRAMUSCULAR | Status: AC
  Administered 2017-06-07: 30 mg via INTRAVENOUS
  Filled 2017-06-07: qty 1

## 2017-06-07 MED ORDER — KETOROLAC TROMETHAMINE 30 MG/ML IJ SOLN
30.0000 mg | Freq: Once | INTRAMUSCULAR | Status: AC
  Administered 2017-06-07: 30 mg via INTRAVENOUS

## 2017-06-07 MED ORDER — METHOHEXITAL SODIUM 100 MG/10ML IV SOSY
PREFILLED_SYRINGE | INTRAVENOUS | Status: DC | PRN
  Administered 2017-06-07: 100 mg via INTRAVENOUS

## 2017-06-07 MED ORDER — HALOPERIDOL LACTATE 5 MG/ML IJ SOLN
INTRAMUSCULAR | Status: AC
  Filled 2017-06-07: qty 1

## 2017-06-07 MED ORDER — GLYCOPYRROLATE 0.2 MG/ML IJ SOLN
0.3000 mg | Freq: Once | INTRAMUSCULAR | Status: AC
  Administered 2017-06-07: 0.3 mg via INTRAVENOUS

## 2017-06-07 MED ORDER — GLYCOPYRROLATE 0.2 MG/ML IJ SOLN
INTRAMUSCULAR | Status: AC
  Administered 2017-06-07: 0.3 mg via INTRAVENOUS
  Filled 2017-06-07: qty 2

## 2017-06-07 MED ORDER — SUCCINYLCHOLINE CHLORIDE 20 MG/ML IJ SOLN
INTRAMUSCULAR | Status: DC | PRN
  Administered 2017-06-07: 150 mg via INTRAVENOUS

## 2017-06-07 MED ORDER — SODIUM CHLORIDE 0.9 % IV SOLN
500.0000 mL | Freq: Once | INTRAVENOUS | Status: AC
  Administered 2017-06-07: 500 mL via INTRAVENOUS

## 2017-06-07 NOTE — Procedures (Signed)
ECT SERVICES Physician's Interval Evaluation & Treatment Note  Patient Identification: Alexander Harrington MRN:  [id number] Date of Evaluation:  06/07/2017 TX #: 6  MADRS:   MMSE:   P.E. Findings:  No change to physical exam  Psychiatric Interval Note:  Mood improved affect euthymic  Subjective:  Patient is a 26 y.o. male seen for evaluation for Electroconvulsive Therapy. Mood improved not much change since Wednesday.  Had a headache last time.  Treatment Summary:   []   Right Unilateral             [x]  Bilateral   % Energy : 1.0 ms 20%   Impedance: 1360 ohms  Seizure Energy Index: No reading  Postictal Suppression Index: Low poor reading  Seizure Concordance Index: Okay from visual inspection  Medications  Pre Shock: Robinul 0.3 mg Zofran 4 mg Toradol 30 mg Brevital 100 mg succinylcholine 150 mg  Post Shock: Versed 4 mg haloperidol 5 mg  Seizure Duration: 23 seconds EMG 44 seconds approximately EEG   Comments: We are going to stop the index course at this point and we will see him back either the 26th or 28th for follow-up  Lungs:  [x]   Clear to auscultation               []  Other:   Heart:    [x]   Regular rhythm             []  irregular rhythm    [x]   Previous H&P reviewed, patient examined and there are NO CHANGES                 []   Previous H&P reviewed, patient examined and there are changes noted.   Alethia Berthold, MD 11/16/201810:26 AM

## 2017-06-07 NOTE — Discharge Instructions (Addendum)
1)  The drugs that you have been given will stay in your system until tomorrow so for the       next 24 hours you should not:  A. Drive an automobile  B. Make any legal decisions  C. Drink any alcoholic beverages  2)  You may resume your regular meals upon return home.  3)  A responsible adult must take you home.  Someone should stay with you for a few          hours, then be available by phone for the remainder of the treatment day.  4)  You May experience any of the following symptoms:  Headache, Nausea and a dry mouth (due to the medications you were given),  temporary memory loss and some confusion, or sore muscles (a warm bath  should help this).  If you you experience any of these symptoms let us know on                your return visit.  5)  Report any of the following: any acute discomfort, severe headache, or temperature        greater than 100.5 F.   Also report any unusual redness, swelling, drainage, or pain         at your IV site.    You may report Symptoms to:  [place] at [facility]          Phone: [phone], ECT Department           or Dr. Prescott Gum office [phone]  6)  Your next ECT Treatment is Day Wednesday  Date June 19, 2017 at 8 am  We will call 2 days prior to your scheduled appointment for arrival times.  7)  Nothing to eat or drink after midnight the night before your procedure.  8)  Take .  With a sip of water the morning of your procedure.  9)  Other Instructions: Call [phone] to cancel the morning of your procedure due         to illness or emergency.  10) We will call within 72 hours to assess how you are feeling.

## 2017-06-07 NOTE — Transfer of Care (Signed)
Immediate Anesthesia Transfer of Care Note  Patient: Alexander Harrington  Procedure(s) Performed: * No procedures listed *  Patient Location: PACU  Anesthesia Type:General  Level of Consciousness: sedated  Airway & Oxygen Therapy: Patient Spontanous Breathing and Patient connected to face mask oxygen  Post-op Assessment: Report given to RN and Post -op Vital signs reviewed and stable  Post vital signs: Reviewed and stable  Last Vitals:  Vitals:   06/07/17 0838  BP: 130/87  Pulse: 72  Resp: 16  Temp: 36.6 C  SpO2: 518%    Complications: No apparent anesthesia complications

## 2017-06-07 NOTE — H&P (Signed)
Alexander Harrington is an 26 y.o. male.   Chief Complaint: Mood improved stable.  Had a headache after last treatment HPI: History of recurrent severe depression unresponsive to medication showing good response now with ECT  Past Medical History:  Diagnosis Date  . Anxiety    Dr. Toy Care  . Bipolar 2 disorder ([place])   . Depression     History reviewed. No pertinent surgical history.  Family History  Problem Relation Age of Onset  . Arthritis Mother   . Mental illness Mother   . Depression Mother   . Anxiety disorder Mother   . Drug abuse Maternal Grandfather   . Alcohol abuse Maternal Grandfather   . Drug abuse Maternal Grandmother   . Alcohol abuse Maternal Grandmother    Social History:  reports that  has never smoked. he has never used smokeless tobacco. He reports that he drinks about 3.0 - 3.6 oz of alcohol per week. He reports that he uses drugs. Frequency: 1.00 time per week.  Allergies:  Allergies  Allergen Reactions  . Amphetamines Other (See Comments)    Caused depression that went away after stopping the amphetamine  . Benzodiazepines Other (See Comments)    Got cloudy thinking, loss of memory, suicidal thinking on Klonopin.  . Codeine Rash     (Not in a hospital admission)  No results found for this or any previous visit (from the past 48 hour(s)). No results found.  Review of Systems  Constitutional: Negative.   HENT: Negative.   Eyes: Negative.   Respiratory: Negative.   Cardiovascular: Negative.   Gastrointestinal: Negative.   Musculoskeletal: Negative.   Skin: Negative.   Neurological: Positive for headaches.  Psychiatric/Behavioral: Positive for memory loss. Negative for depression, hallucinations, substance abuse and suicidal ideas. The patient is not nervous/anxious and does not have insomnia.     Blood pressure 130/87, pulse 72, temperature 97.9 F (36.6 C), temperature source Oral, resp. rate 16, weight 101.6 kg (224 lb), SpO2 100 %. Physical Exam   Nursing note and vitals reviewed. Constitutional: He appears well-developed and well-nourished.  HENT:  Head: Normocephalic and atraumatic.  Eyes: Conjunctivae are normal. Pupils are equal, round, and reactive to light.  Neck: Normal range of motion.  Cardiovascular: Regular rhythm and normal heart sounds.  Respiratory: Effort normal. No respiratory distress.  GI: Soft.  Musculoskeletal: Normal range of motion.  Neurological: He is alert.  Skin: Skin is warm and dry.  Psychiatric: Judgment normal. His affect is blunt. His speech is delayed. He is slowed. He expresses no homicidal and no suicidal ideation. He exhibits abnormal recent memory.     Assessment/Plan Patient appears to have plateaued.  We will do treatment today and then see him back on either the 26th or 28 for follow-up if he agrees.  Alethia Berthold, MD 06/07/2017, 10:16 AM

## 2017-06-07 NOTE — Anesthesia Post-op Follow-up Note (Signed)
Anesthesia QCDR form completed.        

## 2017-06-07 NOTE — Anesthesia Procedure Notes (Signed)
Date/Time: 06/07/2017 10:13 AM Performed by: Doreen Salvage, CRNA Pre-anesthesia Checklist: Patient identified, Emergency Drugs available, Suction available and Patient being monitored Patient Re-evaluated:Patient Re-evaluated prior to induction Oxygen Delivery Method: Circle system utilized Preoxygenation: Pre-oxygenation with 100% oxygen Induction Type: IV induction Ventilation: Mask ventilation without difficulty and Mask ventilation throughout procedure Airway Equipment and Method: Bite block Placement Confirmation: positive ETCO2 Dental Injury: Teeth and Oropharynx as per pre-operative assessment

## 2017-06-07 NOTE — Anesthesia Preprocedure Evaluation (Signed)
Anesthesia Evaluation  Patient identified by MRN, date of birth, ID band Patient awake    Reviewed: Allergy & Precautions, NPO status , Patient's Chart, lab work & pertinent test results, reviewed documented beta blocker date and time   History of Anesthesia Complications Negative for: history of anesthetic complications  Airway Mallampati: II  TM Distance: >3 FB     Dental  (+) Chipped   Pulmonary neg pulmonary ROS, neg sleep apnea, neg COPD,    breath sounds clear to auscultation- rhonchi (-) wheezing      Cardiovascular Exercise Tolerance: Good (-) hypertension(-) CAD, (-) Past MI and (-) Cardiac Stents  Rhythm:Regular Rate:Normal - Systolic murmurs and - Diastolic murmurs    Neuro/Psych PSYCHIATRIC DISORDERS negative neurological ROS     GI/Hepatic negative GI ROS, Neg liver ROS,   Endo/Other  neg diabetesHypothyroidism   Renal/GU negative Renal ROS     Musculoskeletal negative musculoskeletal ROS (+)   Abdominal (+) + obese,   Peds  Hematology negative hematology ROS (+)   Anesthesia Other Findings Past Medical History: No date: Anxiety     Comment:  Dr. Toy Care No date: Bipolar 2 disorder ([place]) No date: Depression   Reproductive/Obstetrics                                                              Anesthesia Evaluation  Patient identified by MRN, date of birth, ID band Patient awake    Reviewed: Allergy & Precautions, NPO status , Patient's Chart, lab work & pertinent test results  History of Anesthesia Complications Negative for: history of anesthetic complications  Airway Mallampati: III  TM Distance: >3 FB Neck ROM: Full    Dental no notable dental hx.    Pulmonary neg pulmonary ROS, neg sleep apnea, neg COPD,    breath sounds clear to auscultation- rhonchi (-) wheezing      Cardiovascular Exercise Tolerance: Good (-) hypertension(-) CAD, (-) Past MI  and (-) Cardiac Stents  Rhythm:Regular Rate:Normal - Systolic murmurs and - Diastolic murmurs    Neuro/Psych PSYCHIATRIC DISORDERS Anxiety Depression Bipolar Disorder negative neurological ROS     GI/Hepatic negative GI ROS, Neg liver ROS,   Endo/Other  neg diabetesHypothyroidism   Renal/GU negative Renal ROS     Musculoskeletal negative musculoskeletal ROS (+)   Abdominal (+) + obese,   Peds  Hematology negative hematology ROS (+)   Anesthesia Other Findings Past Medical History: No date: Anxiety     Comment:  Dr. Toy Care No date: Bipolar 2 disorder ([place]) No date: Depression   Reproductive/Obstetrics                             Anesthesia Physical Anesthesia Plan  ASA: II  Anesthesia Plan:    Post-op Pain Management:    Induction: Intravenous  PONV Risk Score and Plan: 1 and Ondansetron  Airway Management Planned: Mask  Additional Equipment:   Intra-op Plan:   Post-operative Plan:   Informed Consent: I have reviewed the patients History and Physical, chart, labs and discussed the procedure including the risks, benefits and alternatives for the proposed anesthesia with the patient or authorized representative who has indicated his/her understanding and acceptance.   Dental advisory given  Plan Discussed with: CRNA and  Anesthesiologist  Anesthesia Plan Comments:         Anesthesia Quick Evaluation  Anesthesia Physical  Anesthesia Plan  ASA: II  Anesthesia Plan: General   Post-op Pain Management:    Induction: Intravenous  PONV Risk Score and Plan: 2 and Ondansetron  Airway Management Planned: Mask  Additional Equipment:   Intra-op Plan:   Post-operative Plan:   Informed Consent: I have reviewed the patients History and Physical, chart, labs and discussed the procedure including the risks, benefits and alternatives for the proposed anesthesia with the patient or authorized representative who has indicated  his/her understanding and acceptance.     Plan Discussed with: CRNA  Anesthesia Plan Comments:         Anesthesia Quick Evaluation

## 2017-06-10 NOTE — Anesthesia Postprocedure Evaluation (Signed)
Anesthesia Post Note  Patient: Alexander Harrington  Procedure(s) Performed: ECT TX  Patient location during evaluation: PACU Anesthesia Type: General Level of consciousness: awake and alert Pain management: pain level controlled Vital Signs Assessment: post-procedure vital signs reviewed and stable Respiratory status: spontaneous breathing, nonlabored ventilation, respiratory function stable and patient connected to nasal cannula oxygen Cardiovascular status: blood pressure returned to baseline and stable Postop Assessment: no apparent nausea or vomiting Anesthetic complications: no     Last Vitals:  Vitals:   06/07/17 1032 06/07/17 1108  BP: (!) 142/84   Pulse: (!) 116 (!) 101  Resp: 19 16  Temp: 37.1 C 37.1 C  SpO2: 96%     Last Pain:  Vitals:   06/07/17 1108  TempSrc: Oral  PainSc:                  Martha Clan

## 2017-06-17 ENCOUNTER — Telehealth: Payer: Self-pay

## 2017-06-19 ENCOUNTER — Telehealth: Payer: Self-pay

## 2017-06-19 ENCOUNTER — Telehealth (HOSPITAL_COMMUNITY): Payer: Self-pay | Admitting: *Deleted

## 2017-06-19 NOTE — Telephone Encounter (Signed)
Heather from [facility] called to give authorization for 6 additional units of ECT until 07/22/17.

## 2017-06-21 ENCOUNTER — Telehealth: Payer: Self-pay | Admitting: *Deleted

## 2017-06-24 ENCOUNTER — Other Ambulatory Visit: Payer: Self-pay | Admitting: Psychiatry

## 2017-06-24 ENCOUNTER — Encounter
Admission: RE | Admit: 2017-06-24 | Discharge: 2017-06-24 | Disposition: A | Discharge status: Home / Self Care | Payer: BC Managed Care – PPO | Source: Ambulatory Visit | Attending: Psychiatry | Admitting: Psychiatry

## 2017-06-24 ENCOUNTER — Encounter: Payer: Self-pay | Admitting: Registered Nurse

## 2017-06-24 DIAGNOSIS — F3181 Bipolar II disorder: Secondary | ICD-10-CM | POA: Diagnosis not present

## 2017-06-24 DIAGNOSIS — F419 Anxiety disorder, unspecified: Secondary | ICD-10-CM | POA: Insufficient documentation

## 2017-06-24 DIAGNOSIS — F332 Major depressive disorder, recurrent severe without psychotic features: Secondary | ICD-10-CM | POA: Diagnosis not present

## 2017-06-24 DIAGNOSIS — Z818 Family history of other mental and behavioral disorders: Secondary | ICD-10-CM | POA: Insufficient documentation

## 2017-06-24 DIAGNOSIS — R51 Headache: Secondary | ICD-10-CM | POA: Insufficient documentation

## 2017-06-24 MED ORDER — KETOROLAC TROMETHAMINE 30 MG/ML IJ SOLN
INTRAMUSCULAR | Status: AC
  Administered 2017-06-24: 30 mg via INTRAVENOUS
  Filled 2017-06-24: qty 1

## 2017-06-24 MED ORDER — MIDAZOLAM HCL 2 MG/2ML IJ SOLN
INTRAMUSCULAR | Status: DC | PRN
  Administered 2017-06-24: 4 mg via INTRAVENOUS

## 2017-06-24 MED ORDER — SODIUM CHLORIDE 0.9 % IV SOLN
500.0000 mL | Freq: Once | INTRAVENOUS | Status: AC
  Administered 2017-06-24: 500 mL via INTRAVENOUS

## 2017-06-24 MED ORDER — GLYCOPYRROLATE 0.2 MG/ML IJ SOLN
INTRAMUSCULAR | Status: AC
  Filled 2017-06-24: qty 2

## 2017-06-24 MED ORDER — SODIUM CHLORIDE 0.9 % IV SOLN
INTRAVENOUS | Status: DC | PRN
  Administered 2017-06-24: 10:00:00 via INTRAVENOUS

## 2017-06-24 MED ORDER — HALOPERIDOL LACTATE 5 MG/ML IJ SOLN
INTRAMUSCULAR | Status: DC | PRN
  Administered 2017-06-24: 5 mg via INTRAVENOUS

## 2017-06-24 MED ORDER — HALOPERIDOL LACTATE 5 MG/ML IJ SOLN
INTRAMUSCULAR | Status: AC
  Filled 2017-06-24: qty 1

## 2017-06-24 MED ORDER — ONDANSETRON HCL 4 MG/2ML IJ SOLN
INTRAMUSCULAR | Status: AC
  Administered 2017-06-24: 4 mg via INTRAVENOUS
  Filled 2017-06-24: qty 2

## 2017-06-24 MED ORDER — SUCCINYLCHOLINE CHLORIDE 20 MG/ML IJ SOLN
INTRAMUSCULAR | Status: DC | PRN
  Administered 2017-06-24: 150 mg via INTRAVENOUS

## 2017-06-24 MED ORDER — KETOROLAC TROMETHAMINE 30 MG/ML IJ SOLN
30.0000 mg | Freq: Once | INTRAMUSCULAR | Status: AC
  Administered 2017-06-24: 30 mg via INTRAVENOUS

## 2017-06-24 MED ORDER — METHOHEXITAL SODIUM 100 MG/10ML IV SOSY
PREFILLED_SYRINGE | INTRAVENOUS | Status: DC | PRN
  Administered 2017-06-24: 100 mg via INTRAVENOUS

## 2017-06-24 MED ORDER — ONDANSETRON HCL 4 MG/2ML IJ SOLN
4.0000 mg | Freq: Once | INTRAMUSCULAR | Status: AC
  Administered 2017-06-24: 4 mg via INTRAVENOUS

## 2017-06-24 MED ORDER — GLYCOPYRROLATE 0.2 MG/ML IJ SOLN
0.3000 mg | Freq: Once | INTRAMUSCULAR | Status: AC
  Administered 2017-06-24: 0.3 mg via INTRAVENOUS

## 2017-06-24 MED ORDER — MIDAZOLAM HCL 2 MG/2ML IJ SOLN
INTRAMUSCULAR | Status: AC
  Filled 2017-06-24: qty 4

## 2017-06-24 NOTE — H&P (Signed)
Alexander Harrington is an 26 y.o. male.   Chief Complaint: Feeling a little more depressed this past week HPI: History of depression responded to ECT did well for a week after the last treatment but has felt bad for about a week now  Past Medical History:  Diagnosis Date  . Anxiety    Dr. Toy Care  . Bipolar 2 disorder ([place])   . Depression     History reviewed. No pertinent surgical history.  Family History  Problem Relation Age of Onset  . Arthritis Mother   . Mental illness Mother   . Depression Mother   . Anxiety disorder Mother   . Drug abuse Maternal Grandfather   . Alcohol abuse Maternal Grandfather   . Drug abuse Maternal Grandmother   . Alcohol abuse Maternal Grandmother    Social History:  reports that  has never smoked. he has never used smokeless tobacco. He reports that he drinks about 3.0 - 3.6 oz of alcohol per week. He reports that he uses drugs. Frequency: 1.00 time per week.  Allergies:  Allergies  Allergen Reactions  . Amphetamines Other (See Comments)    Caused depression that went away after stopping the amphetamine  . Benzodiazepines Other (See Comments)    Got cloudy thinking, loss of memory, suicidal thinking on Klonopin.  . Codeine Rash     (Not in a hospital admission)  No results found for this or any previous visit (from the past 48 hour(s)). No results found.  Review of Systems  Constitutional: Negative.   HENT: Negative.   Eyes: Negative.   Respiratory: Negative.   Cardiovascular: Negative.   Gastrointestinal: Negative.   Musculoskeletal: Negative.   Skin: Negative.   Neurological: Negative.   Psychiatric/Behavioral: Positive for depression. Negative for hallucinations, memory loss, substance abuse and suicidal ideas. The patient is not nervous/anxious and does not have insomnia.     Blood pressure (!) 115/97, pulse 81, temperature 97.8 F (36.6 C), temperature source Oral, resp. rate 16, height 5\' 10"  (1.778 m), weight 101.2 kg (223 lb),  SpO2 100 %. Physical Exam  Nursing note and vitals reviewed. Constitutional: He appears well-developed and well-nourished.  HENT:  Head: Normocephalic and atraumatic.  Eyes: Conjunctivae are normal. Pupils are equal, round, and reactive to light.  Neck: Normal range of motion.  Cardiovascular: Regular rhythm and normal heart sounds.  Respiratory: Effort normal and breath sounds normal. No respiratory distress.  GI: Soft.  Musculoskeletal: Normal range of motion.  Neurological: He is alert.  Skin: Skin is warm and dry.  Psychiatric: Judgment normal. His affect is blunt. His speech is delayed. He is slowed. Thought content is not paranoid. Cognition and memory are normal. He expresses no homicidal and no suicidal ideation.     Assessment/Plan Twice a week this week with the next treatment being on Friday with ongoing reassessment  Alethia Berthold, MD 06/24/2017, 10:27 AM

## 2017-06-24 NOTE — Anesthesia Preprocedure Evaluation (Signed)
Anesthesia Evaluation  Patient identified by MRN, date of birth, ID band Patient awake    Reviewed: Allergy & Precautions, H&P , NPO status , Patient's Chart, lab work & pertinent test results, reviewed documented beta blocker date and time   Airway Mallampati: II   Neck ROM: full    Dental  (+) Poor Dentition   Pulmonary neg pulmonary ROS,    Pulmonary exam normal        Cardiovascular negative cardio ROS Normal cardiovascular exam Rhythm:regular Rate:Normal     Neuro/Psych negative neurological ROS  negative psych ROS   GI/Hepatic negative GI ROS, Neg liver ROS,   Endo/Other  negative endocrine ROS  Renal/GU negative Renal ROS  negative genitourinary   Musculoskeletal   Abdominal   Peds  Hematology negative hematology ROS (+)   Anesthesia Other Findings Past Medical History: No date: Anxiety     Comment:  Dr. Toy Care No date: Bipolar 2 disorder ([place]) No date: Depression No past surgical history on file.   Reproductive/Obstetrics negative OB ROS                             Anesthesia Physical Anesthesia Plan  ASA: II  Anesthesia Plan: General   Post-op Pain Management:    Induction:   PONV Risk Score and Plan: 2  Airway Management Planned:   Additional Equipment:   Intra-op Plan:   Post-operative Plan:   Informed Consent: I have reviewed the patients History and Physical, chart, labs and discussed the procedure including the risks, benefits and alternatives for the proposed anesthesia with the patient or authorized representative who has indicated his/her understanding and acceptance.   Dental Advisory Given  Plan Discussed with: CRNA  Anesthesia Plan Comments:         Anesthesia Quick Evaluation

## 2017-06-24 NOTE — Transfer of Care (Signed)
Immediate Anesthesia Transfer of Care Note  Patient: Alexander Harrington  Procedure(s) Performed: ECT TX  Patient Location: PACU  Anesthesia Type:General  Level of Consciousness: awake, alert  and oriented  Airway & Oxygen Therapy: Patient Spontanous Breathing and Patient connected to face mask oxygen  Post-op Assessment: Report given to RN and Post -op Vital signs reviewed and stable  Post vital signs: Reviewed and stable  Last Vitals:  Vitals:   06/24/17 1034 06/24/17 1035  BP: (!) 155/98 (!) 155/98  Pulse: (!) 122 (!) 124  Resp: (!) 24 (!) 25  Temp: 37.1 C (!) 36.1 C  SpO2: 95% 95%    Last Pain:  Vitals:   06/24/17 1035  TempSrc: Tympanic  PainSc:          Complications: No apparent anesthesia complications

## 2017-06-24 NOTE — Anesthesia Procedure Notes (Signed)
Date/Time: 06/24/2017 10:25 AM Performed by: Hedda Slade, CRNA Pre-anesthesia Checklist: Patient identified, Emergency Drugs available, Suction available and Patient being monitored Patient Re-evaluated:Patient Re-evaluated prior to induction Oxygen Delivery Method: Circle system utilized Preoxygenation: Pre-oxygenation with 100% oxygen Induction Type: IV induction Ventilation: Mask ventilation with difficulty Airway Equipment and Method: Bite block

## 2017-06-24 NOTE — Discharge Instructions (Signed)
1)  The drugs that you have been given will stay in your system until tomorrow so for the       next 24 hours you should not:  A. Drive an automobile  B. Make any legal decisions  C. Drink any alcoholic beverages  2)  You may resume your regular meals upon return home.  3)  A responsible adult must take you home.  Someone should stay with you for a few          hours, then be available by phone for the remainder of the treatment day.  4)  You May experience any of the following symptoms:  Headache, Nausea and a dry mouth (due to the medications you were given),  temporary memory loss and some confusion, or sore muscles (a warm bath  should help this).  If you you experience any of these symptoms let us know on                your return visit.  5)  Report any of the following: any acute discomfort, severe headache, or temperature        greater than 100.5 F.   Also report any unusual redness, swelling, drainage, or pain         at your IV site.    You may report Symptoms to:  [place] at [facility]          Phone: [phone], ECT Department           or Dr. Prescott Gum office [phone]  6)  Your next ECT Treatment is Day  Friday  Date  June 28, 2017  We will call 2 days prior to your scheduled appointment for arrival times.  7)  Nothing to eat or drink after midnight the night before your procedure.  8)  Take.    With a sip of water the morning of your procedure.  9)  Other Instructions: Call [phone] to cancel the morning of your procedure due         to illness or emergency.  10) We will call within 72 hours to assess how you are feeling.

## 2017-06-24 NOTE — Procedures (Signed)
ECT SERVICES Physician's Interval Evaluation & Treatment Note  Patient Identification: Alexander Harrington MRN:  [id number] Date of Evaluation:  06/24/2017 TX #: 7  MADRS: 27  MMSE: 29  P.E. Findings:  No change to physical exam  Psychiatric Interval Note:  Affect a little flat a little more slovenly. Slightly more subjectively depressed  Subjective:  Patient is a 26 y.o. male seen for evaluation for Electroconvulsive Therapy. More depressed and not psychotic not suicidal  Treatment Summary:   []   Right Unilateral             [x]  Bilateral   % Energy : 1.0 ms, 20%   Impedance: 1390 ohms  Seizure Energy Index: 18,130 V squared  Postictal Suppression Index: 95%  Seizure Concordance Index: 98%  Medications  Pre Shock: Robinul 0.3 mg Zofran 4 mg Toradol 30 mg Brevital 100 mg succinylcholine 150 mg  Post Shock: Versed 4 mg Haldol 5 mg  Seizure Duration: 60 seconds by EMG 69 seconds by EEG   Comments: Follow-up Friday   Lungs:  [x]   Clear to auscultation               []  Other:   Heart:    [x]   Regular rhythm             []  irregular rhythm    [x]   Previous H&P reviewed, patient examined and there are NO CHANGES                 []   Previous H&P reviewed, patient examined and there are changes noted.   Alethia Berthold, MD 12/3/201810:29 AM

## 2017-06-24 NOTE — Anesthesia Post-op Follow-up Note (Signed)
Anesthesia QCDR form completed.        

## 2017-06-26 ENCOUNTER — Telehealth: Payer: Self-pay

## 2017-06-27 NOTE — Anesthesia Postprocedure Evaluation (Signed)
Anesthesia Post Note  Patient: Alexander Harrington  Procedure(s) Performed: ECT TX  Patient location during evaluation: PACU Anesthesia Type: General Level of consciousness: awake and alert Pain management: pain level controlled Vital Signs Assessment: post-procedure vital signs reviewed and stable Respiratory status: spontaneous breathing, nonlabored ventilation, respiratory function stable and patient connected to nasal cannula oxygen Cardiovascular status: blood pressure returned to baseline and stable Postop Assessment: no apparent nausea or vomiting Anesthetic complications: no     Last Vitals:  Vitals:   06/24/17 1053 06/24/17 1103  BP: 126/90 124/87  Pulse: (!) 124 (!) 122  Resp: 14 16  Temp: 36.9 C   SpO2: 97%     Last Pain:  Vitals:   06/24/17 1103  TempSrc:   PainSc: 0-No pain                 Molli Barrows

## 2017-07-07 ENCOUNTER — Other Ambulatory Visit: Payer: Self-pay | Admitting: Psychiatry

## 2017-07-08 ENCOUNTER — Encounter: Payer: Self-pay | Admitting: Anesthesiology

## 2017-07-08 ENCOUNTER — Encounter (HOSPITAL_BASED_OUTPATIENT_CLINIC_OR_DEPARTMENT_OTHER)
Admission: RE | Admit: 2017-07-08 | Discharge: 2017-07-08 | Disposition: A | Discharge status: Home / Self Care | Payer: BC Managed Care – PPO | Source: Ambulatory Visit | Attending: Psychiatry | Admitting: Psychiatry

## 2017-07-08 DIAGNOSIS — F332 Major depressive disorder, recurrent severe without psychotic features: Secondary | ICD-10-CM | POA: Diagnosis not present

## 2017-07-08 MED ORDER — MIDAZOLAM HCL 2 MG/2ML IJ SOLN
4.0000 mg | Freq: Once | INTRAMUSCULAR | Status: AC
  Administered 2017-07-08: 4 mg via INTRAVENOUS

## 2017-07-08 MED ORDER — ONDANSETRON HCL 4 MG/2ML IJ SOLN: 4.0000 mg | Freq: Once | INTRAMUSCULAR | Status: DC | PRN

## 2017-07-08 MED ORDER — GLYCOPYRROLATE 0.2 MG/ML IJ SOLN
INTRAMUSCULAR | Status: AC
  Administered 2017-07-08: 0.3 mg via INTRAVENOUS
  Filled 2017-07-08: qty 2

## 2017-07-08 MED ORDER — HALOPERIDOL LACTATE 5 MG/ML IJ SOLN
INTRAMUSCULAR | Status: AC
  Filled 2017-07-08: qty 1

## 2017-07-08 MED ORDER — GLYCOPYRROLATE 0.2 MG/ML IJ SOLN
0.3000 mg | Freq: Once | INTRAMUSCULAR | Status: AC
  Administered 2017-07-08: 0.3 mg via INTRAVENOUS

## 2017-07-08 MED ORDER — METHOHEXITAL SODIUM 100 MG/10ML IV SOSY
PREFILLED_SYRINGE | INTRAVENOUS | Status: DC | PRN
  Administered 2017-07-08: 100 mg via INTRAVENOUS

## 2017-07-08 MED ORDER — MIDAZOLAM HCL 5 MG/5ML IJ SOLN
INTRAMUSCULAR | Status: AC
  Filled 2017-07-08: qty 5

## 2017-07-08 MED ORDER — SODIUM CHLORIDE 0.9 % IV SOLN
500.0000 mL | Freq: Once | INTRAVENOUS | Status: AC
Start: 2017-07-08 — End: 2017-07-08
  Administered 2017-07-08: 500 mL via INTRAVENOUS

## 2017-07-08 MED ORDER — HALOPERIDOL LACTATE 5 MG/ML IJ SOLN
INTRAMUSCULAR | Status: DC | PRN
  Administered 2017-07-08: 5 mg via INTRAVENOUS

## 2017-07-08 MED ORDER — FENTANYL CITRATE (PF) 100 MCG/2ML IJ SOLN: 25.0000 ug | INTRAMUSCULAR | Status: DC | PRN

## 2017-07-08 MED ORDER — KETOROLAC TROMETHAMINE 30 MG/ML IJ SOLN
INTRAMUSCULAR | Status: AC
  Administered 2017-07-08: 30 mg via INTRAVENOUS
  Filled 2017-07-08: qty 1

## 2017-07-08 MED ORDER — SUCCINYLCHOLINE CHLORIDE 20 MG/ML IJ SOLN
INTRAMUSCULAR | Status: DC | PRN
  Administered 2017-07-08: 150 mg via INTRAVENOUS

## 2017-07-08 MED ORDER — KETOROLAC TROMETHAMINE 30 MG/ML IJ SOLN
30.0000 mg | Freq: Once | INTRAMUSCULAR | Status: AC
  Administered 2017-07-08: 30 mg via INTRAVENOUS

## 2017-07-08 MED ORDER — ONDANSETRON HCL 4 MG/2ML IJ SOLN
INTRAMUSCULAR | Status: AC
  Administered 2017-07-08: 4 mg via INTRAVENOUS
  Filled 2017-07-08: qty 2

## 2017-07-08 MED ORDER — ONDANSETRON HCL 4 MG/2ML IJ SOLN
4.0000 mg | Freq: Once | INTRAMUSCULAR | Status: AC
  Administered 2017-07-08: 4 mg via INTRAVENOUS

## 2017-07-08 NOTE — Anesthesia Postprocedure Evaluation (Signed)
Anesthesia Post Note  Patient: [name]  Procedure(s) Performed: ECT TX  Patient location during evaluation: PACU Anesthesia Type: General Level of consciousness: awake and alert Pain management: pain level controlled Vital Signs Assessment: post-procedure vital signs reviewed and stable Respiratory status: spontaneous breathing, nonlabored ventilation, respiratory function stable and patient connected to nasal cannula oxygen Cardiovascular status: blood pressure returned to baseline and stable Postop Assessment: no apparent nausea or vomiting Anesthetic complications: no     Last Vitals:  Vitals:   07/08/17 1215 07/08/17 1222  BP: 121/86 107/77  Pulse: (!) 104 (!) 103  Resp: 13 12  Temp:    SpO2: 96% 96%    Last Pain:  Vitals:   07/08/17 1155  TempSrc:   PainSc: [place]

## 2017-07-08 NOTE — H&P (Signed)
Alexander Harrington is an 26 y.o. male.   Chief Complaint: Patient is having some confusion mental slowing.  Depression is a little bit worse. HPI: History of recurrent severe depression partial response to ECT but with cognitive impairment  Past Medical History:  Diagnosis Date  . Anxiety    Dr. Toy Care  . Bipolar 2 disorder ([place])   . Depression     History reviewed. No pertinent surgical history.  Family History  Problem Relation Age of Onset  . Arthritis Mother   . Mental illness Mother   . Depression Mother   . Anxiety disorder Mother   . Drug abuse Maternal Grandfather   . Alcohol abuse Maternal Grandfather   . Drug abuse Maternal Grandmother   . Alcohol abuse Maternal Grandmother    Social History:  reports that  has never smoked. he has never used smokeless tobacco. He reports that he drinks about 3.0 - 3.6 oz of alcohol per week. He reports that he uses drugs. Frequency: 1.00 time per week.  Allergies:  Allergies  Allergen Reactions  . Amphetamines Other (See Comments)    Caused depression that went away after stopping the amphetamine  . Benzodiazepines Other (See Comments)    Got cloudy thinking, loss of memory, suicidal thinking on Klonopin.  . Codeine Rash     (Not in a hospital admission)  No results found for this or any previous visit (from the past 48 hour(s)). No results found.  Review of Systems  Constitutional: Negative.   HENT: Negative.   Eyes: Negative.   Respiratory: Negative.   Cardiovascular: Negative.   Gastrointestinal: Negative.   Musculoskeletal: Negative.   Skin: Negative.   Neurological: Negative.   Psychiatric/Behavioral: Positive for depression and memory loss. Negative for hallucinations, substance abuse and suicidal ideas. The patient is nervous/anxious. The patient does not have insomnia.     Blood pressure 121/83, pulse 72, temperature 98.5 F (36.9 C), temperature source Oral, resp. rate 18, height 5\' 10"  (1.778 m), weight 100.7 kg  (222 lb), SpO2 98 %. Physical Exam  Nursing note and vitals reviewed. Constitutional: He appears well-developed and well-nourished.  HENT:  Head: Normocephalic and atraumatic.  Eyes: Conjunctivae are normal. Pupils are equal, round, and reactive to light.  Neck: Normal range of motion.  Cardiovascular: Regular rhythm and normal heart sounds.  Respiratory: Effort normal.  GI: Soft.  Musculoskeletal: Normal range of motion.  Neurological: He is alert.  Skin: Skin is warm and dry.  Psychiatric: Judgment normal. His mood appears anxious. His affect is blunt. His speech is delayed. He is slowed. Cognition and memory are normal. He exhibits a depressed mood. He expresses no homicidal and no suicidal ideation.     Assessment/Plan Anxiety seems to be contributing to cognitive impairment.  He did seem to respond to ECT.  I would like to see him back more frequently if we are going to get an improvement and so we were going to ask him to see if we can come in on Wednesday this week.  Alethia Berthold, MD 07/08/2017, 11:17 AM

## 2017-07-08 NOTE — Anesthesia Post-op Follow-up Note (Signed)
Anesthesia QCDR form completed.        

## 2017-07-08 NOTE — Procedures (Signed)
ECT SERVICES Physician's Interval Evaluation & Treatment Note  Patient Identification: Alexander Harrington MRN:  [id number] Date of Evaluation:  07/08/2017 TX #: 8  MADRS:   MMSE:   P.E. Findings:  No change to physical exam  Psychiatric Interval Note:  Mood is a little bit more anxious and depressed  Subjective:  Patient is a 26 y.o. male seen for evaluation for Electroconvulsive Therapy. Depressed and anxious  Treatment Summary:   []   Right Unilateral             [x]  Bilateral   % Energy : 1.0 ms 20%   Impedance: 1160 ohms  Seizure Energy Index: 15,129 V squared  Postictal Suppression Index: 89%  Seizure Concordance Index: 96%  Medications  Pre Shock: Robinul 0.3 mg Zofran 4 mg Toradol 30 mg Brevital 100 mg succinylcholine 150 mg  Post Shock: Versed 4 mg Haldol 5 mg  Seizure Duration: 45 seconds by EMG 55 seconds by EEG   Comments: Follow-up Wednesday  Lungs:  [x]   Clear to auscultation               []  Other:   Heart:    [x]   Regular rhythm             []  irregular rhythm    [x]   Previous H&P reviewed, patient examined and there are NO CHANGES                 []   Previous H&P reviewed, patient examined and there are changes noted.   Alethia Berthold, MD 12/17/201811:19 AM

## 2017-07-08 NOTE — Anesthesia Preprocedure Evaluation (Signed)
Anesthesia Evaluation  Patient identified by MRN, date of birth, ID band Patient awake    Reviewed: Allergy & Precautions, H&P , NPO status , Patient's Chart, lab work & pertinent test results, reviewed documented beta blocker date and time   Airway Mallampati: II   Neck ROM: full    Dental  (+) Poor Dentition   Pulmonary neg pulmonary ROS,    Pulmonary exam normal        Cardiovascular negative cardio ROS Normal cardiovascular exam Rhythm:regular Rate:Normal     Neuro/Psych negative neurological ROS  negative psych ROS   GI/Hepatic negative GI ROS, Neg liver ROS,   Endo/Other  negative endocrine ROS  Renal/GU negative Renal ROS  negative genitourinary   Musculoskeletal   Abdominal   Peds  Hematology negative hematology ROS (+)   Anesthesia Other Findings Past Medical History: No date: Anxiety     Comment:  Dr. Toy Care No date: Bipolar 2 disorder ([place]) No date: Depression History reviewed. No pertinent surgical history. BMI    Body Mass Index:  31.85 kg/m     Reproductive/Obstetrics negative OB ROS                             Anesthesia Physical Anesthesia Plan  ASA: III  Anesthesia Plan: General   Post-op Pain Management:    Induction:   PONV Risk Score and Plan:   Airway Management Planned:   Additional Equipment:   Intra-op Plan:   Post-operative Plan:   Informed Consent: I have reviewed the patients History and Physical, chart, labs and discussed the procedure including the risks, benefits and alternatives for the proposed anesthesia with the patient or authorized representative who has indicated his/her understanding and acceptance.   Dental Advisory Given  Plan Discussed with: CRNA  Anesthesia Plan Comments:         Anesthesia Quick Evaluation

## 2017-07-08 NOTE — Discharge Instructions (Signed)
1)  The drugs that you have been given will stay in your system until tomorrow so for the       next 24 hours you should not:  A. Drive an automobile  B. Make any legal decisions  C. Drink any alcoholic beverages  2)  You may resume your regular meals upon return home.  3)  A responsible adult must take you home.  Someone should stay with you for a few          hours, then be available by phone for the remainder of the treatment day.  4)  You May experience any of the following symptoms:  Headache, Nausea and a dry mouth (due to the medications you were given),  temporary memory loss and some confusion, or sore muscles (a warm bath  should help this).  If you you experience any of these symptoms let us know on                your return visit.  5)  Report any of the following: any acute discomfort, severe headache, or temperature        greater than 100.5 F.   Also report any unusual redness, swelling, drainage, or pain         at your IV site.    You may report Symptoms to:  [place] at [facility]          Phone: [phone], ECT Department           or Dr. Prescott Gum office [phone]  6)  Your next ECT Treatment is Wednesday December 12   We will call 2 days prior to your scheduled appointment for arrival times.  7)  Nothing to eat or drink after midnight the night before your procedure.  8)  Take      With a sip of water the morning of your procedure.  9)  Other Instructions: Call [phone] to cancel the morning of your procedure due         to illness or emergency.  10) We will call within 72 hours to assess how you are feeling.

## 2017-07-08 NOTE — Transfer of Care (Signed)
Immediate Anesthesia Transfer of Care Note  Patient: Alexander Harrington  Procedure(s) Performed: ECT TX  Patient Location: PACU  Anesthesia Type:General  Level of Consciousness: sedated  Airway & Oxygen Therapy: Patient Spontanous Breathing and Patient connected to face mask oxygen  Post-op Assessment: Report given to RN and Post -op Vital signs reviewed and stable  Post vital signs: Reviewed and stable  Last Vitals:  Vitals:   07/08/17 0932 07/08/17 1145  BP: 121/83 (!) 140/92  Pulse: 72 (!) 102  Resp: 18 (!) 22  Temp: 36.9 C 37.1 C  SpO2: 98% 96%    Last Pain:  Vitals:   07/08/17 1145  TempSrc:   PainSc: Asleep         Complications: No apparent anesthesia complications

## 2017-07-09 ENCOUNTER — Other Ambulatory Visit: Payer: Self-pay | Admitting: Psychiatry

## 2017-07-10 ENCOUNTER — Encounter: Payer: Self-pay | Admitting: Anesthesiology

## 2017-07-10 ENCOUNTER — Encounter
Admission: RE | Admit: 2017-07-10 | Discharge: 2017-07-10 | Disposition: A | Discharge status: Home / Self Care | Payer: BC Managed Care – PPO | Source: Ambulatory Visit | Attending: Psychiatry | Admitting: Psychiatry

## 2017-07-10 DIAGNOSIS — F332 Major depressive disorder, recurrent severe without psychotic features: Secondary | ICD-10-CM

## 2017-07-10 DIAGNOSIS — F419 Anxiety disorder, unspecified: Secondary | ICD-10-CM | POA: Insufficient documentation

## 2017-07-10 DIAGNOSIS — F3181 Bipolar II disorder: Secondary | ICD-10-CM | POA: Insufficient documentation

## 2017-07-10 MED ORDER — SUCCINYLCHOLINE CHLORIDE 20 MG/ML IJ SOLN
INTRAMUSCULAR | Status: AC
  Filled 2017-07-10: qty 1

## 2017-07-10 MED ORDER — KETOROLAC TROMETHAMINE 30 MG/ML IJ SOLN
INTRAMUSCULAR | Status: AC
  Filled 2017-07-10: qty 1

## 2017-07-10 MED ORDER — MIDAZOLAM HCL 2 MG/2ML IJ SOLN
INTRAMUSCULAR | Status: DC | PRN
  Administered 2017-07-10: 4 mg via INTRAVENOUS

## 2017-07-10 MED ORDER — MIDAZOLAM HCL 2 MG/2ML IJ SOLN
INTRAMUSCULAR | Status: AC
  Filled 2017-07-10: qty 4

## 2017-07-10 MED ORDER — METHOHEXITAL SODIUM 100 MG/10ML IV SOSY
PREFILLED_SYRINGE | INTRAVENOUS | Status: DC | PRN
  Administered 2017-07-10: 100 mg via INTRAVENOUS

## 2017-07-10 MED ORDER — KETOROLAC TROMETHAMINE 30 MG/ML IJ SOLN
30.0000 mg | Freq: Once | INTRAMUSCULAR | Status: AC
  Administered 2017-07-10: 30 mg via INTRAVENOUS

## 2017-07-10 MED ORDER — SODIUM CHLORIDE 0.9 % IV SOLN
INTRAVENOUS | Status: DC | PRN
  Administered 2017-07-10: 09:00:00 via INTRAVENOUS

## 2017-07-10 MED ORDER — SUCCINYLCHOLINE CHLORIDE 200 MG/10ML IV SOSY
PREFILLED_SYRINGE | INTRAVENOUS | Status: DC | PRN
  Administered 2017-07-10: 150 mg via INTRAVENOUS

## 2017-07-10 MED ORDER — HALOPERIDOL LACTATE 5 MG/ML IJ SOLN
INTRAMUSCULAR | Status: AC
  Filled 2017-07-10: qty 1

## 2017-07-10 MED ORDER — FENTANYL CITRATE (PF) 100 MCG/2ML IJ SOLN: 25.0000 ug | INTRAMUSCULAR | Status: DC | PRN

## 2017-07-10 MED ORDER — GLYCOPYRROLATE 0.2 MG/ML IJ SOLN
0.3000 mg | Freq: Once | INTRAMUSCULAR | Status: AC
Start: 2017-07-10 — End: 2017-07-10
  Administered 2017-07-10: 0.3 mg via INTRAVENOUS

## 2017-07-10 MED ORDER — ONDANSETRON HCL 4 MG/2ML IJ SOLN
INTRAMUSCULAR | Status: AC
  Filled 2017-07-10: qty 2

## 2017-07-10 MED ORDER — ONDANSETRON HCL 4 MG/2ML IJ SOLN: 4.0000 mg | Freq: Once | INTRAMUSCULAR | Status: DC | PRN

## 2017-07-10 MED ORDER — HALOPERIDOL LACTATE 5 MG/ML IJ SOLN
INTRAMUSCULAR | Status: DC | PRN
  Administered 2017-07-10: 5 mg via INTRAVENOUS

## 2017-07-10 MED ORDER — ONDANSETRON HCL 4 MG/2ML IJ SOLN
4.0000 mg | Freq: Once | INTRAMUSCULAR | Status: AC
  Administered 2017-07-10: 4 mg via INTRAVENOUS

## 2017-07-10 MED ORDER — GLYCOPYRROLATE 0.2 MG/ML IJ SOLN
INTRAMUSCULAR | Status: AC
  Filled 2017-07-10: qty 2

## 2017-07-10 MED ORDER — SODIUM CHLORIDE 0.9 % IV SOLN
500.0000 mL | Freq: Once | INTRAVENOUS | Status: AC
  Administered 2017-07-10: 500 mL via INTRAVENOUS

## 2017-07-10 NOTE — Discharge Instructions (Signed)
1)  The drugs that you have been given will stay in your system until tomorrow so for the       next 24 hours you should not:  A. Drive an automobile  B. Make any legal decisions  C. Drink any alcoholic beverages  2)  You may resume your regular meals upon return home.  3)  A responsible adult must take you home.  Someone should stay with you for a few          hours, then be available by phone for the remainder of the treatment day.  4)  You May experience any of the following symptoms:  Headache, Nausea and a dry mouth (due to the medications you were given),  temporary memory loss and some confusion, or sore muscles (a warm bath  should help this).  If you you experience any of these symptoms let us know on                your return visit.  5)  Report any of the following: any acute discomfort, severe headache, or temperature        greater than 100.5 F.   Also report any unusual redness, swelling, drainage, or pain         at your IV site.    You may report Symptoms to:  [place] at [facility]          Phone: [phone], ECT Department           or Dr. Prescott Gum office [phone]  6)  Your next ECT Treatment is Day Wednesday  Date July 24, 2016  We will call 2 days prior to your scheduled appointment for arrival times.  7)  Nothing to eat or drink after midnight the night before your procedure.  8)  Take.    With a sip of water the morning of your procedure.  9)  Other Instructions: Call [phone] to cancel the morning of your procedure due         to illness or emergency.  10) We will call within 72 hours to assess how you are feeling.

## 2017-07-10 NOTE — Transfer of Care (Signed)
Immediate Anesthesia Transfer of Care Note  Patient: Alexander Harrington  Procedure(s) Performed: ECT TX  Patient Location: PACU  Anesthesia Type:General  Level of Consciousness: sedated  Airway & Oxygen Therapy: Patient Spontanous Breathing and Patient connected to face mask oxygen  Post-op Assessment: Report given to RN and Post -op Vital signs reviewed and stable  Post vital signs: Reviewed and stable  Last Vitals:  Vitals:   07/10/17 0834 07/10/17 1038  BP: 137/90 (P) 138/84  Pulse: 80   Resp: 14   Temp: 37.1 C 36.8 C  SpO2: 98%     Last Pain:  Vitals:   07/10/17 0834  TempSrc: Oral         Complications: No apparent anesthesia complications

## 2017-07-10 NOTE — H&P (Signed)
Alexander Harrington is an 26 y.o. male.   Chief Complaint: Patient reports that he is having memory impairment during the day.  Mood has been fairly stable but flat.  Not reporting active suicidal thought or psychosis HPI: History of recurrent severe depression  Past Medical History:  Diagnosis Date  . Anxiety    Dr. Toy Care  . Bipolar 2 disorder ([name])   . Depression     History reviewed. No pertinent surgical history.  Family History  Problem Relation Age of Onset  . Arthritis Mother   . Mental illness Mother   . Depression Mother   . Anxiety disorder Mother   . Drug abuse Maternal Grandfather   . Alcohol abuse Maternal Grandfather   . Drug abuse Maternal Grandmother   . Alcohol abuse Maternal Grandmother    Social History:  reports that  has never smoked. he has never used smokeless tobacco. He reports that he drinks about 3.0 - 3.6 oz of alcohol per week. He reports that he uses drugs. Frequency: 1.00 time per week.  Allergies:  Allergies  Allergen Reactions  . Amphetamines Other (See Comments)    Caused depression that went away after stopping the amphetamine  . Benzodiazepines Other (See Comments)    Got cloudy thinking, loss of memory, suicidal thinking on Klonopin.  . Codeine Rash     (Not in a hospital admission)  No results found for this or any previous visit (from the past 48 hour(s)). No results found.  Review of Systems  Constitutional: Negative.   HENT: Negative.   Eyes: Negative.   Respiratory: Negative.   Cardiovascular: Negative.   Gastrointestinal: Negative.   Musculoskeletal: Negative.   Skin: Negative.   Neurological: Negative.   Psychiatric/Behavioral: Positive for memory loss. Negative for depression, hallucinations, substance abuse and suicidal ideas. The patient is not nervous/anxious and does not have insomnia.     Blood pressure 137/90, pulse 80, temperature 98.7 F (37.1 C), temperature source Oral, resp. rate 14, height 5\' 10"  (1.778 m),  weight 100.7 kg (222 lb), SpO2 98 %. Physical Exam  Nursing note and vitals reviewed. Constitutional: He appears well-developed and well-nourished.  HENT:  Head: Normocephalic and atraumatic.  Eyes: Conjunctivae are normal. Pupils are equal, round, and reactive to light.  Neck: Normal range of motion.  Cardiovascular: Regular rhythm and normal heart sounds.  Respiratory: Effort normal and breath sounds normal. No respiratory distress.  GI: Soft.  Musculoskeletal: Normal range of motion.  Neurological: He is alert.  Skin: Skin is warm and dry.  Psychiatric: Judgment normal. His affect is blunt. His speech is delayed. He is slowed. Thought content is not paranoid. He expresses no homicidal and no suicidal ideation. He exhibits abnormal recent memory.     Assessment/Plan Treatment today.  We discussed options between the patient and his father and I.  I suggested that possibly we have him come back this Friday but they feel it is better to wait.  We will schedule him then for after the Christmas break.  Alethia Berthold, MD 07/10/2017, 9:49 AM

## 2017-07-10 NOTE — Procedures (Signed)
ECT SERVICES Physician's Interval Evaluation & Treatment Note  Patient Identification: Alexander Harrington MRN:  [id number] Date of Evaluation:  07/10/2017 TX #: 9  MADRS:   MMSE:   P.E. Findings:  No change to physical exam  Psychiatric Interval Note:  Mood improved but memory impaired  Subjective:  Patient is a 26 y.o. male seen for evaluation for Electroconvulsive Therapy. Some memory impairment  Treatment Summary:   []   Right Unilateral             [x]  Bilateral   % Energy : 1.0 ms 20%   Impedance: 1440 ohms  Seizure Energy Index: 5108 V squared  Postictal Suppression Index: 55%  Seizure Concordance Index: 94%  Medications  Pre Shock: Robinul 0.3 ms Zofran 4 mg Toradol 30 mg Brevital 100 mg succinylcholine 150 mg  Post Shock: Versed 4 mg Haldol 5 mg  Seizure Duration: 79 seconds by EMG 96 seconds by EEG   Comments: Generally doing well we will see him back by his request January 2  Lungs:  [x]   Clear to auscultation               []  Other:   Heart:    [x]   Regular rhythm             []  irregular rhythm    [x]   Previous H&P reviewed, patient examined and there are NO CHANGES                 []   Previous H&P reviewed, patient examined and there are changes noted.   Alethia Berthold, MD 12/19/201810:18 AM

## 2017-07-10 NOTE — Anesthesia Procedure Notes (Signed)
Date/Time: 07/10/2017 10:27 AM Performed by: Dionne Bucy, CRNA Pre-anesthesia Checklist: Patient identified, Emergency Drugs available, Suction available and Patient being monitored Patient Re-evaluated:Patient Re-evaluated prior to induction Oxygen Delivery Method: Circle system utilized Preoxygenation: Pre-oxygenation with 100% oxygen Induction Type: IV induction Ventilation: Mask ventilation without difficulty and Mask ventilation throughout procedure Airway Equipment and Method: Bite block Placement Confirmation: positive ETCO2 Dental Injury: Teeth and Oropharynx as per pre-operative assessment

## 2017-07-10 NOTE — Anesthesia Preprocedure Evaluation (Signed)
Anesthesia Evaluation  Patient identified by MRN, date of birth, ID band Patient awake    Reviewed: Allergy & Precautions, NPO status , Patient's Chart, lab work & pertinent test results  Airway Mallampati: II       Dental  (+) Teeth Intact   Pulmonary neg pulmonary ROS,    breath sounds clear to auscultation       Cardiovascular Exercise Tolerance: Good  Rhythm:Regular     Neuro/Psych Depression Bipolar Disorder negative neurological ROS     GI/Hepatic negative GI ROS, Neg liver ROS,   Endo/Other  Hypothyroidism   Renal/GU negative Renal ROS  negative genitourinary   Musculoskeletal negative musculoskeletal ROS (+)   Abdominal Normal abdominal exam  (+)   Peds negative pediatric ROS (+)  Hematology negative hematology ROS (+)   Anesthesia Other Findings   Reproductive/Obstetrics                             Anesthesia Physical Anesthesia Plan  ASA: II  Anesthesia Plan: General   Post-op Pain Management:    Induction: Intravenous  PONV Risk Score and Plan:   Airway Management Planned: Mask  Additional Equipment:   Intra-op Plan:   Post-operative Plan:   Informed Consent: I have reviewed the patients History and Physical, chart, labs and discussed the procedure including the risks, benefits and alternatives for the proposed anesthesia with the patient or authorized representative who has indicated his/her understanding and acceptance.     Plan Discussed with: CRNA  Anesthesia Plan Comments:         Anesthesia Quick Evaluation

## 2017-07-10 NOTE — Anesthesia Post-op Follow-up Note (Signed)
Anesthesia QCDR form completed.        

## 2017-07-10 NOTE — Anesthesia Postprocedure Evaluation (Signed)
Anesthesia Post Note  Patient: Alexander Harrington  Procedure(s) Performed: ECT TX  Patient location during evaluation: PACU Anesthesia Type: General Level of consciousness: awake Pain management: pain level controlled Vital Signs Assessment: post-procedure vital signs reviewed and stable Respiratory status: spontaneous breathing Cardiovascular status: stable Anesthetic complications: no     Last Vitals:  Vitals:   07/10/17 1106 07/10/17 1115  BP: 136/86 (!) 103/91  Pulse: (!) 110 (!) 115  Resp: 18 18  Temp: 37.1 C (!) 36.3 C  SpO2: 95% 98%    Last Pain:  Vitals:   07/10/17 1115  TempSrc: Temporal  PainSc:                  VAN STAVEREN,[name]

## 2017-07-23 ENCOUNTER — Other Ambulatory Visit: Payer: Self-pay | Admitting: Psychiatry

## 2017-07-24 ENCOUNTER — Telehealth: Payer: Self-pay

## 2017-07-24 ENCOUNTER — Other Ambulatory Visit (HOSPITAL_COMMUNITY): Payer: BC Managed Care – PPO | Attending: Psychiatry | Admitting: Licensed Clinical Social Worker

## 2017-07-24 DIAGNOSIS — F332 Major depressive disorder, recurrent severe without psychotic features: Secondary | ICD-10-CM | POA: Insufficient documentation

## 2017-07-25 ENCOUNTER — Other Ambulatory Visit (HOSPITAL_COMMUNITY): Payer: BC Managed Care – PPO | Admitting: Licensed Clinical Social Worker

## 2017-07-25 ENCOUNTER — Ambulatory Visit (HOSPITAL_COMMUNITY): Payer: Self-pay

## 2017-07-25 ENCOUNTER — Other Ambulatory Visit (HOSPITAL_COMMUNITY): Payer: BC Managed Care – PPO | Admitting: Specialist

## 2017-07-25 ENCOUNTER — Encounter (HOSPITAL_COMMUNITY): Payer: Self-pay

## 2017-07-25 DIAGNOSIS — F332 Major depressive disorder, recurrent severe without psychotic features: Secondary | ICD-10-CM | POA: Diagnosis not present

## 2017-07-25 DIAGNOSIS — R4589 Other symptoms and signs involving emotional state: Secondary | ICD-10-CM

## 2017-07-25 DIAGNOSIS — F3132 Bipolar disorder, current episode depressed, moderate: Secondary | ICD-10-CM

## 2017-07-25 DIAGNOSIS — F070 Personality change due to known physiological condition: Secondary | ICD-10-CM

## 2017-07-25 NOTE — Psych (Signed)
Comprehensive Clinical Assessment (CCA) Note  07/25/2017 Alexander Harrington [id number]  Visit Diagnosis:      ICD-10-CM   1. Severe recurrent major depression without psychotic features ([place]) F33.2       CCA Part One  Part One has been completed on paper by the patient.  (See scanned document in Chart Review)  CCA Part Two A  Intake/Chief Complaint:  CCA Intake With Chief Complaint CCA Part Two Date: 07/24/17 CCA Part Two Time: 29 Chief Complaint/Presenting Problem: Pt reports for a CCA for PHP. Pt reports his depression symptoms have worsened over the past few months. Pt just completed ECT and states it worked for a while but then stopped and his symptoms got worse again. Pt states he is feeling somewhat hopeless because "ECT was supposed to be the most dramatic thing I could do for help." Pt reports he has done multiple forms of treatment, including ECT, [facility], IOP, and individual counseling. Pt states he has dealt with depression for "many years." Pt states he took "years" to graduate from college due to the symptoms and being unable to complete some semesters. Pt states "I am feeling unstable and empty" and "I'm never present minded and it's unpleasant."  Pt reports he is having daily SI and starting to think about what things he would give away and what notes he would need to write but does not have a plan. Pt engages in self-harm actions (mainly cutting and banging head) and shows cln recent scars on his arm from cutting "a few weeks ago." Pt reports he drinks about once a week and understands the dangers with his meds. Pt reports what he drinks (beer/liquor) and amount varies from week to week. Pt states he has lost a lot of his support recently due to life changes (graduation, moving away, jobs) but still has support from [place]. Pt states he has "manic episodes" and states he stayed up for 48 hours 3 nights ago in a "manic state."   Patients Currently Reported  Symptoms/Problems: SI, self-harm acts (cutting, hitting head); increase in depression; anxiety; racing thoughts; sleep issues (sometimes insomnia and other times over sleeping); mood swings, racing thoughts; confusion; memory problems (pt. reports this may be related to ECT); irritability; hopelessness; feeling empty; anhedonia; obsessive thoughts Collateral Involvement: Pt reports his father brought him to the CCA appt Individual's Strengths: Supportive family Initial Clinical Notes/Concerns: Pt states he has tried many types of treatment, including ECT, [place] IOP, inpatient stay, and individual therapy.  Pt reports he has not tried PHP and is willing to try anything that may help him at this point.  Mental Health Symptoms Depression:  Depression: Change in energy/activity, Difficulty Concentrating, Fatigue, Hopelessness, Irritability, Sleep (too much or little), Worthlessness, Increase/decrease in appetite  Mania:     Anxiety:      Psychosis:     Trauma:     Obsessions:     Compulsions:     Inattention:     Hyperactivity/Impulsivity:     Oppositional/Defiant Behaviors:     Borderline Personality:  Emotional Irregularity: Chronic feelings of emptiness, Potentially harmful impulsivity  Other Mood/Personality Symptoms:      Mental Status Exam Appearance and self-care  Stature:  Stature: Average  Weight:  Weight: Overweight  Clothing:  Clothing: Casual  Grooming:  Grooming: Normal  Cosmetic use:  Cosmetic Use: None  Posture/gait:  Posture/Gait: Normal  Motor activity:  Motor Activity: Not Remarkable  Sensorium  Attention:  Attention: Normal  Concentration:  Concentration: Normal  Orientation:  Orientation: X5  Recall/memory:  Recall/Memory: Normal(Pt believes ECT has "messed with my memory" and has become obsessive about making sure he remembers things, ex: small details in movies he is watching)  Affect and Mood  Affect:  Affect: Depressed  Mood:  Mood: Depressed  Relating  Eye  contact:  Eye Contact: Fleeting  Facial expression:  Facial Expression: Depressed  Attitude toward examiner:  Attitude Toward Examiner: Cooperative  Thought and Language  Speech flow: Speech Flow: Normal  Thought content:  Thought Content: Appropriate to mood and circumstances  Preoccupation:     Hallucinations:     Organization:     Transport planner of Knowledge:  Fund of Knowledge: Average  Intelligence:  Intelligence: Average  Abstraction:  Abstraction: Normal  Judgement:  Judgement: Poor  Reality Testing:  Reality Testing: Adequate  Insight:  Insight: Poor  Decision Making:  Decision Making: Paralyzed  Social Functioning  Social Maturity:  Social Maturity: Impulsive  Social Judgement:     Stress  Stressors:  Stressors: Transitions, Illness  Coping Ability:  Coping Ability: Deficient supports  Skill Deficits:     Supports:      Family and Psychosocial History: Family history Marital status: Single Does patient have children?: No  Childhood History:  Childhood History By whom was/is the patient raised?: Both parents Description of patient's relationship with caregiver when they were a child: Pt report he had a good relationship with both parents when growing up. He also states he wished he wasn't an only child. Patient's description of current relationship with people who raised him/her: Pt currently lives with both parents. Pt states he gets along with both mother and father, but feels closer to father. Pt reports "I feel like they're sick of dealing with it (mental illness) and that makes me discouraged." Pt also states, "I wish I could be more for them." Does patient have siblings?: No Did patient suffer any verbal/emotional/physical/sexual abuse as a child?: No Did patient suffer from severe childhood neglect?: No Has patient ever been sexually abused/assaulted/raped as an adolescent or adult?: No Was the patient ever a victim of a crime or a disaster?:  No Witnessed domestic violence?: No Has patient been effected by domestic violence as an adult?: No  CCA Part Two B  Employment/Work Situation: Employment / Work Copywriter, advertising Employment situation: Unemployed Patient's job has been impacted by current illness: Yes Describe how patient's job has been impacted: Pt is currently unable to find and hold down a job due to symptoms. What is the longest time patient has a held a job?: 3 years in different departments Where was the patient employed at that time?: [employer] in different departments as a Nutritional therapist Has patient ever been in the [employer]?: No Has patient ever served in combat?: No Did You Receive Any Psychiatric Treatment/Services While in Passenger transport manager?: No Are There Guns or Other Weapons in [place]?: No  Education: Education Did Physicist, medical?: Yes What Type of College Degree Do you Have?: Media Studies Did Heritage manager?: No Did You Have Any Special Interests In School?: Film making Did You Have An Individualized Education Program (IIEP): No Did You Have Any Difficulty At School?: Yes Were Any Medications Ever Prescribed For These Difficulties?: Yes Medications Prescribed For School Difficulties?: Stimulants for ADHD  Religion: Religion/Spirituality Are You A Religious Person?: No  Leisure/Recreation: Leisure / Recreation Leisure and Hobbies: hanging out with friends, watching movies, video games, traveling  Exercise/Diet: Exercise/Diet Do You Exercise?:  No Have You Gained or Lost A Significant Amount of Weight in the Past Six Months?: Yes-Lost Number of Pounds Lost?: 20 Do You Follow a Special Diet?: No Do You Have Any Trouble Sleeping?: Yes Explanation of Sleeping Difficulties: "severe insomnia or oversleeping"  CCA Part Two C  Alcohol/Drug Use: Alcohol / Drug Use Pain Medications: Patient denies Prescriptions: see MAR History of alcohol / drug use?: Yes Substance #1 Name of Substance 1:  Alcohol 1 - Age of First Use: Teen 1 - Amount (size/oz): "varies" 1 - Frequency: 1x a week 1 - Duration: a couple of years 1 - Last Use / Amount: "I don't know how much but I got drunk at Christmas" (a week ago)    CCA Part Three  ASAM's:  Six Dimensions of Multidimensional Assessment  Dimension 1:  Acute Intoxication and/or Withdrawal Potential:     Dimension 2:  Biomedical Conditions and Complications:     Dimension 3:  Emotional, Behavioral, or Cognitive Conditions and Complications:     Dimension 4:  Readiness to Change:     Dimension 5:  Relapse, Continued use, or Continued Problem Potential:     Dimension 6:  Recovery/Living Environment:      Substance use Disorder (SUD)    Social Function:  Social Functioning Social Maturity: Impulsive  Stress:  Stress Stressors: Transitions, Illness Coping Ability: Deficient supports Patient Takes Medications The Way The Doctor Instructed?: Yes Priority Risk: Moderate Risk  Risk Assessment- Self-Harm Potential: Risk Assessment For Self-Harm Potential Thoughts of Self-Harm: Vague current thoughts Method: No plan Additional Information for Self-Harm Potential: Acts of Self-harm, Previous Attempts Additional Comments for Self-Harm Potential: Pt has self-harmed for years; Pt cut himself a few weeks ago on his arm but did not require medical attention. Pt attempted suicide in 2013 which required an inpt stay. Pt states has started to think about what notes he would need to write and who to give his possessions to  Risk Assessment -Dangerous to Others Potential: Risk Assessment For Dangerous to Others Potential Method: No Plan  DSM5 Diagnoses: Patient Active Problem List   Diagnosis Date Noted  . Hoarseness of voice 10/04/2015  . Tachycardia 08/30/2015  . Leukocytosis 02/08/2015  . Other specified hypothyroidism 02/08/2015  . MDD (major depressive disorder), recurrent episode, severe ([place]) 12/01/2013  . Substance induced mood  disorder ([place]) 04/11/2012    Class: Acute  . Severe episode of recurrent major depressive disorder ([place]) 04/09/2012    Class: Acute  . Encounter for long-term (current) use of other medications 02/21/2011  . Routine general medical examination at a health care facility 02/21/2011  . Keratosis pilaris 02/21/2011    Patient Centered Plan: Patient is on the following Treatment Plan(s):  Depression  Recommendations for Services/Supports/Treatments: Recommendations for Services/Supports/Treatments Recommendations For Services/Supports/Treatments: Partial Hospitalization(Pt is currently having SI and self-harming. Pt reports he has tried many treatments and wants to try something new to see if it will help.)  Treatment Plan Summary: OP Treatment Plan Summary: Pt states "I'm feeling unstable and empty all the time."  Referrals to Alternative Service(s): Referred to Alternative Service(s):   Place:   Date:   Time:    Referred to Alternative Service(s):   Place:   Date:   Time:    Referred to Alternative Service(s):   Place:   Date:   Time:    Referred to Alternative Service(s):   Place:   Date:   Time:     Royetta Crochet, LPCA

## 2017-07-25 NOTE — Therapy (Signed)
[facility] [place], [place], [address] Phone: [phone]   Fax:  [phone]  Occupational Therapy Evaluation  Patient Details  Name: Alexander Harrington MRN: [id number] Date of Birth: [birth year] No Data Recorded  Encounter Date: 07/25/2017  OT End of Session - 07/25/17 1347    Visit Number  1    Number of Visits  8    Date for OT Re-Evaluation  08/15/17    Authorization Type  [employer] PPO    OT Start Time  1030    OT Stop Time  1140    OT Time Calculation (min)  70 min    Activity Tolerance  Patient tolerated treatment well    Behavior During Therapy  [facility] for tasks assessed/performed       Past Medical History:  Diagnosis Date  . Anxiety    Dr. Toy Care  . Bipolar 2 disorder ([place])   . Depression     History reviewed. No pertinent surgical history.  There were no vitals filed for this visit.  Subjective Assessment - 07/25/17 1346    Currently in Pain?  No/denies    Pain Score  0-No pain        [facility] OT Assessment - 07/25/17 0001      Assessment   Medical Diagnosis  Difficulty Coping due to MDD    Referring Provider  Dr. Donnelly Angelica      Precautions   Precautions  None      Restrictions   Weight Bearing Restrictions  No      Balance Screen   Has the patient fallen in the past 6 months  No    Has the patient had a decrease in activity level because of a fear of falling?   No    Is the patient reluctant to leave their home because of a fear of falling?   No      Prior Function   Level of Independence  Independent    Vocation  Unemployed    Leisure  playing video games        OT assessment Diagnosis:  MDD Past medical history:  ECT, self cutting Living situation lives with parents  ADLs independent  Work recently graduated and unemployed Leisure video games  Social support parents, friends Struggles coping skills for stress, staying focused OT goal:  Learn coping skills for  stress, improve time management General Causality Orientation Scale   Subscore Percentile Score  Autonomy 58 64.59  Control 40 17.14  Impersonal 53 69.69   Motivation Type  Motivation type Explanation  o    o    o  .  o Mixed The individual does not have a prominent motivational orientation.  Currently there is no clear theoretical explanation, and research shows this motivation type does not benefit from motivational interventions.   Assessment:  Patient demonstrates mixed motivation type.  Patient will benefit from occupational therapy intervention in order to improve time management, financial management, stress management, job readiness skills, social skills,sleep hygiene, exercise and healthy eating habits,  and health management skills and other psychosocial skills needed for preparation to return to full time community living and to be a productive community member.   Plan:  Patient will participate in skilled occupational therapy sessions individually or in a group setting to improve coping skills, psychosocial skills, and emotional skills required to return to prior level of function as a productive community member. Treatment will be 1-2 times per  week for 2-6 weeks.     O:  Skilled OT group focused on stress management this date.  Group opened with members identifying 6 stressful situations they have encountered.  Group weighed in on their personal level of stress with each situation.  Group discussed what causes varying levels of stress for the same situation.  Group discussed  recurrent physical and psychological stress can diminish self-esteem, decrease interpersonal and academic effectiveness and create a cycle of self-blame and self-doubt.  Group was educated on importance of identifying symptoms of stress, events are not stressful but rather our interpretations and reactions to event that are stressful.  Group member took a stress symptom checklist and discussed results.  Group  discussed poor coping mechanisms including self medication, suppression, passivity, acting out, blaming/complaining), why it may work short term, as well as possible long term complications.  Group discussed and learned positive coping mechanisms to stress including: relaxation, positive mental attitude, support, self-care and lifestyle changes, interpersonal strategies, emotional strategies, cognitive strategies, and philosophical strategies.  Group member was given handouts with 150 suggestions to decrease and manage stress, and member was able to voice 1 new strategy they will implement to manage stress.  A:  Alexander Harrington able to identify that journaling will help him combat stress. P:  Continued skilled ot group focusing on time management.                 OT Education - 07/25/17 1346    Education provided  Yes    Education Details  educated on 150 strategies to decrease stress     Person(s) Educated  Patient    Methods  Explanation;Handout    Comprehension  Verbalized understanding       OT Short Term Goals - 07/25/17 1349      OT SHORT TERM GOAL #1   Title  Patient will be educated on strategies to improve psychosocial skills needed to participate fully in all daily, work, and leisure activities.    Time  3    Period  Weeks    Status  New    Target Date  08/15/17      OT SHORT TERM GOAL #2   Title  Patient will be educated on a HEP and independent with implementation of HEP.    Time  3    Period  Weeks    Status  New      OT SHORT TERM GOAL #3   Title  Patient will independently apply psychosocial skills and coping mechanisms to her daily activities in order to function independently.    Time  3    Period  Weeks    Status  New               Plan - 07/25/17 1347    Occupational Profile and client history currently impacting functional performance  motivation, results with other treatment options     Occupational performance deficits (Please refer to  evaluation for details):  ADL's;IADL's;Rest and Sleep;Education;Work;Social Participation    Rehab Potential  Good    Current Impairments/barriers affecting progress:  motivation    OT Frequency  2x / week    OT Duration  -- 3 weeks    OT Treatment/Interventions  Self-care/ADL training coping skills, psychosocial skills    Clinical Decision Making  Limited treatment options, no task modification necessary    Consulted and Agree with Plan of Care  Patient       Patient will benefit from skilled therapeutic intervention  in order to improve the following deficits and impairments:  Other (comment)(decreased ADL completion, decreased psychosocial skills, decreased coping skills )  Visit Diagnosis: 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 Hoarseness of voice 10/04/2015  . Tachycardia 08/30/2015  . Leukocytosis 02/08/2015  . Other specified hypothyroidism 02/08/2015  . MDD (major depressive disorder), recurrent episode, severe ([place]) 12/01/2013  . Substance induced mood disorder ([place]) 04/11/2012    Class: Acute  . Severe episode of recurrent major depressive disorder ([place]) 04/09/2012    Class: Acute  . Encounter for long-term (current) use of other medications 02/21/2011  . Routine general medical examination at a health care facility 02/21/2011  . Keratosis pilaris 02/21/2011    Vangie Bicker, MHA, OTR/L [phone]  07/25/2017, 1:52 PM  [facility] PARTIAL HOSPITALIZATION PROGRAM [place], [place], [address] Phone: [phone]   Fax:  [phone]  Name: Alexander Harrington MRN: [id number] Date of Birth: 04/22/1991

## 2017-07-25 NOTE — Psych (Signed)
Behavioral Health Partial Program Assessment Note  Date: 07/25/2017 Name: Alexander Harrington MRN: [id number]  Chief Complaint: depression  Subjective:says he is depressed all the time and nothing has helped.  Just finished a second course of ECT which did not help and he is demoralized even more   NFA:OZHYQM says he has been depressed since middle school when he was bullied.  His childhood was good as an only child.  High school was difficult socially though academics were easy.  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  He wants a girlfriend but is socially awkward and had not found anyone.  He has graduated and wants to work with writing and making films but is too depressed to pursue that.  No interests or hobbies or friends.  Lives with his parents for the moment.  Still makes cuts on his body and bangs his head.  Feels hopeless but not currently suicidal.  Long history of treatment including inpatient, [place], ECT, medications and outpatient therapy.  Nothing helped he said.  Has been diagnosed as bipolar and describes manic-like episodes but without euphoria or grandiosity.  Has irritability, cannot stop thinking and extra energy during these episodes.Patient is a 27 y.o. Caucasian male presents with depression.  Patient was enrolled in partial psychiatric program on 07/25/17.  Primary complaints include: anxiety, depression worse, poor concentration and relationship difficulties.  Onset of symptoms was gradual with gradually worsening course since that time. Psychosocial Stressors include the following: chronic depression.   I have reviewed the history as presented by the patient  Complaints of Pain: nonear Past Psychiatric History:  Extensive previous contact over the years  Currently in treatment with Dr Toy Care.  Substance Abuse History: Social use of alcohol Use of Alcohol: occasional, social use once a week or less Use of Caffeine: other not an issue Use of  over the counter: not an issue  No past surgical history on file.  Past Medical History:  Diagnosis Date  . Anxiety    Dr. Toy Care  . Bipolar 2 disorder ([place])   . Depression    Outpatient Encounter Medications as of 07/25/2017  Medication Sig  . fluticasone (FLONASE) 50 MCG/ACT nasal spray PLACE 2 SPRAYS INTO BOTH NOSTRILS DAILY  . gabapentin (NEURONTIN) 600 MG tablet Take 600 mg by mouth daily.  Marland Kitchen L-Methylfolate (DEPLIN PO) Take by mouth.  . lamoTRIgine (LAMICTAL) 200 MG tablet Take 200 mg by mouth daily.  Marland Kitchen OLANZapine-FLUoxetine (SYMBYAX) 3-25 MG capsule   . Vitamin D, Ergocalciferol, (DRISDOL) 50000 units CAPS capsule    No facility-administered encounter medications on file as of 07/25/2017.    Allergies  Allergen Reactions  . Amphetamines Other (See Comments)    Caused depression that went away after stopping the amphetamine  . Benzodiazepines Other (See Comments)    Got cloudy thinking, loss of memory, suicidal thinking on Klonopin.  . Codeine Rash    Social History   Tobacco Use  . Smoking status: Never Smoker  . Smokeless tobacco: Never Used  Substance Use Topics  . Alcohol use: Yes    Alcohol/week: 3.0 - 3.6 oz    Types: 5 - 6 Cans of beer per week    Comment: once a week or every other week   Functioning Relationships: good support system and good relationship with parents Education: College       Please specify degree: bachelor's degree Other Pertinent History: None Family History  Problem Relation Age of Onset  . Arthritis  Mother   . Mental illness Mother   . Depression Mother   . Anxiety disorder Mother   . Drug abuse Maternal Grandfather   . Alcohol abuse Maternal Grandfather   . Drug abuse Maternal Grandmother   . Alcohol abuse Maternal Grandmother      Review of Systems Constitutional: negative Eyes: negative Ears, nose, mouth, throat, and face: negative Respiratory: negative Cardiovascular: negative Gastrointestinal: negative Genitourinary:  negative Integument/breast: negative Hematologic/lymphatic: negative Musculoskeletal: negative Neurological: negative Behavioral/Psych: depression Endocrine: negative Allergic/Immunologic: negative  Objective:  There were no vitals filed for this visit.  Physical Exam: No exam performed today, no exam necessary.  Mental Status Exam: Appearance:  Well groomed Psychomotor::  Within Normal Limits Attention span and concentration: Normal Behavior: calm and cooperative Speech:  normal pitch and normal volume Mood:  depressed and anxious Affect:  normal and mood-congruent Thought Process:  Coherent and Goal Directed Thought Content:  Illogical Orientation:  person, place and time/date Cognition:  grossly intact Insight:  Intact Judgment:  Intact Estimate of Intelligence: Above average Fund of knowledge: Intact Memory: Recent and remote intact Abnormal movements: None Gait and station: Normal  Assessment:  Diagnosis:  Bipolar 1 disorder major depressive type  Indications for admission: inpatient care required if not in partial hospital program  Plan: patient enrolled in Partial Hospitalization Program  Treatment options and alternatives reviewed with patient and patient understands the above plan.   Comments: no treatment in the past has helped to any degree but IOP seemed the most helpful he says .    Donnelly Angelica, MD

## 2017-07-26 ENCOUNTER — Other Ambulatory Visit (HOSPITAL_COMMUNITY): Payer: BC Managed Care – PPO | Admitting: Licensed Clinical Social Worker

## 2017-07-26 ENCOUNTER — Encounter (HOSPITAL_COMMUNITY): Payer: Self-pay

## 2017-07-26 VITALS — BP 120/78 | HR 95 | Ht 69.75 in | Wt 221.0 lb

## 2017-07-26 DIAGNOSIS — F332 Major depressive disorder, recurrent severe without psychotic features: Secondary | ICD-10-CM | POA: Diagnosis not present

## 2017-07-26 DIAGNOSIS — F3132 Bipolar disorder, current episode depressed, moderate: Secondary | ICD-10-CM

## 2017-07-26 NOTE — Psych (Signed)
   [facility] PHP THERAPIST PROGRESS NOTE  Alexander Harrington [id number]  Session Time: 9-2  Participation Level: Active  Behavioral Response: CasualAlertAnxious and Depressed  Type of Therapy: Group Therapy, Psychoeducation, Psychotherapy, Activity Therapy, Art Therapy  Treatment Goals addressed: Coping  Interventions: CBT, DBT, Supportive and Reframing  Summary:  9:00 -10:30: Clinician led check-in regarding current stressors and situation, and review of patient completed daily inventory. Clinician utilized active listening and empathetic response and validated patient emotions. Clinician facilitated processing group on pertinent issues.  10:30 - 11:30: OT Group  11:30 - 12:15: Clinician introduced topic of "Goals". Group discussed why having goals is important. Group completed a goals worksheet to help narrow down steps to working towards a goal.  12:15 - 1:00: Reflection group: Patients encouraged to practice skills and interpersonal techniques or work on mindfulness and relaxation techniques. The importance of self-care and making skills part of a routine to increase usage were stressed.  1:00-1:50: Art Therapy: The group created individual vision boards related to goals for 2019.  1:50 - 2:00 Clinician led check-out. Clinician assessed for immediate needs, medication compliance and efficacy, and safety concerns.   Suicidal/Homicidal: Yeswithout intent/plan  Therapist Response: [name] is a 27 y.o. male who presents with depression and anxiety symptoms, as well as SI and self-harm. Patient arrived within time allowed and reports he is feeling "exhausted." Patient rates his mood at a 5 on a 1- 10 scale with 10 being great. Pt reports he did not sleep at all the previous night.  Pt reports increased anxiety due to starting PHP. Pt reports he had some SI last night.  Patient engaged in activity and discussion. Patient demonstrates some progress as evidenced by participation in his first  group despite elevated anxiety. Patient denies SI/HI/self-harm thoughts at end of session.  Plan: Patient will continue in PHP and medication management while working on decreasing depression and anxiety symptoms, SI and self-harm, and increasing distress tolerance skills and emotional regulation.  Diagnosis: Bipolar 1 disorder, depressed, moderate ([place]) [F31.32]    1. Bipolar 1 disorder, depressed, moderate ([place])     Davin Muramoto J Karliah Kowalchuk, LPCA 07/26/2017

## 2017-07-26 NOTE — Psych (Signed)
   [facility] PHP THERAPIST PROGRESS NOTE  Alexander Harrington [id number]  Session Time: 9-2  Participation Level: Active  Behavioral Response: CasualAlertAnxious and Depressed  Type of Therapy: Group Therapy, Psychoeducation, Psychotherapy, Activity Therapy  Treatment Goals addressed: Coping  Interventions: CBT, DBT, Supportive and Reframing  Summary:  9:00 - 10:30 Clinician led check-in regarding current stressors and situation, and review of patient completed daily inventory. Clinician utilized active listening and empathetic response and validated patient emotions. Clinician facilitated processing group on pertinent issues.  10:30 -12:00 Clinician led a relationship workshop. Patients had the chance to discuss current relationship issues and work together to come up with possible solutions.  12:00 - 12:50 Group watched "The Person You Really Need to Marry" Ted-Talk. Group discussed how accepting/not accepting self can affect relationships with others.  12:50 - 1:00 Clinician led check-out. Clinician assessed for immediate needs, medication compliance and efficacy, and safety concerns.    Suicidal/Homicidal: Nowithout intent/plan  Therapist Response: Alexander Harrington is a 27 y.o. male who presents with depression and anxiety symptoms, as well as SI and self-harm. Patient arrived within time allowed and reports he is feeling "okay." Patient rates his mood at a 7 on a 1- 10 scale with 10 being great. Pt reports he sleeping 8 hours last night which has improved his outlook. Pt states he kept busy yesterday which was helpful.  Patient engaged in activity and discussion. Pt identified feeling like he is too dependent on other people. Patient demonstrates some progress as evidenced by increased openness. Patient denies SI/HI/self-harm thoughts at end of session.  Plan: Patient will continue in PHP and medication management while working on decreasing depression and anxiety symptoms, SI and self-harm, and  increasing distress tolerance skills and emotional regulation.  Diagnosis: Bipolar 1 disorder, depressed, moderate ([place]) [F31.32]    1. Bipolar 1 disorder, depressed, moderate ([place])     Lorin Glass, LCSW 07/26/2017

## 2017-07-26 NOTE — Progress Notes (Signed)
Patient presents with flat affect, depressed mood and reported liking Partial Hospitalization Program "so far" today.  Patient rated his current level of depression a 5, down from he reported is typically at an 8, anxiety a 5, and hopelessness a 7 on a scale of 0-10 with 0 being none and 10 the worst he could manage.  Patient reported living at home now and primarily supported by his parents but hopes to have his own place one day again in the future.  Patient reported he has started using Ambien 10 mg a night again recently to help with sleep as reported this was "all over the place" and is now better, sleeping about 7-8 hours.  Patient reported appetite is good and no self harm from cutting to toughts about any type of self-harm over the "past few weeks".  Patient recently was in ECT but reports not sure how much this helps.  Reports he thinks PHP may be helpful with managing mood and current depressive symptoms.  Patient reviewed all medications with this nurse and denies any current side effects or problems with present medication regimen.  Patient agreed to contact this nurse or PHP staff if any worsening of symptoms or if he began to have thoughts of wanting to harm self or others again.  Patient reported plan to continue with PHP as states he sought out the program when he did not feel he was doing okay and is happy to be attending at this time.

## 2017-07-29 ENCOUNTER — Other Ambulatory Visit (HOSPITAL_COMMUNITY): Payer: BC Managed Care – PPO | Admitting: Licensed Clinical Social Worker

## 2017-07-29 DIAGNOSIS — F332 Major depressive disorder, recurrent severe without psychotic features: Secondary | ICD-10-CM | POA: Diagnosis not present

## 2017-07-29 DIAGNOSIS — F3132 Bipolar disorder, current episode depressed, moderate: Secondary | ICD-10-CM

## 2017-07-30 ENCOUNTER — Other Ambulatory Visit (HOSPITAL_COMMUNITY): Payer: BC Managed Care – PPO | Admitting: Occupational Therapy

## 2017-07-30 ENCOUNTER — Other Ambulatory Visit (HOSPITAL_COMMUNITY): Payer: BC Managed Care – PPO | Admitting: Licensed Clinical Social Worker

## 2017-07-30 DIAGNOSIS — F070 Personality change due to known physiological condition: Secondary | ICD-10-CM

## 2017-07-30 DIAGNOSIS — F332 Major depressive disorder, recurrent severe without psychotic features: Secondary | ICD-10-CM

## 2017-07-30 DIAGNOSIS — F3132 Bipolar disorder, current episode depressed, moderate: Secondary | ICD-10-CM

## 2017-07-30 DIAGNOSIS — R4589 Other symptoms and signs involving emotional state: Secondary | ICD-10-CM

## 2017-07-30 NOTE — Therapy (Signed)
[facility] [place], [place], [address] Phone: [phone]   Fax:  [phone]  Occupational Therapy Treatment  Patient Details  Name: Alexander Harrington MRN: [id number] Date of Birth: 11-19-1990 No Data Recorded  Encounter Date: 07/30/2017  OT End of Session - 07/30/17 1549    Visit Number  2    Number of Visits  8    Date for OT Re-Evaluation  08/15/17    Authorization Type  [employer] PPO    OT Start Time  1030    OT Stop Time  1130    OT Time Calculation (min)  60 min    Activity Tolerance  Patient tolerated treatment well    Behavior During Therapy  [facility] for tasks assessed/performed       Past Medical History:  Diagnosis Date  . Anxiety    Dr. Toy Care  . Bipolar 2 disorder ([place])   . Depression     No past surgical history on file.  There were no vitals filed for this visit.  Subjective Assessment - 07/30/17 1549    Currently in Pain?  No/denies         [facility] OT Assessment - 07/30/17 1549      Assessment   Medical Diagnosis  Difficulty Coping due to MDD      Precautions   Precautions  None          OT Treatment Group: Sleep Hygiene   S:  "I have trouble going to sleep but if I wake up I can go back to sleep without much problem." O:  Patient actively participated in the following skilled occupational therapy treatment session this date: Sleep hygiene - Began session with sleep hygiene questionnaire. Pt completed questionnaire and participated in discussion. Group then played Sleep Hygiene Taboo game, pt actively participated and engaged in game. Discussed importance of a routine leading up to bedtime, and educated on factors influencing sleep. Patient marginally engaged during session. Pt participated in group discussion of factors that interfere with sleep and lifestyle factors promoting sleep. Discussed importance of exercise for the body's metabolism and promoting healthy sleep, as well as  foods that can help or hinder sleep.  A:  Patient participated in skilled occupational therapy group for sleep hygiene skills this date.  Patient was actively engaged during session and appears open to strategies introduced.  P:  Continue participation in skilled occupational therapy groups  1-2 times per week in order to gain the necessary skills needed to return to full time community living and learn effective coping strategies to be a productive community resident. Next session: time management      OT Short Term Goals - 07/30/17 1550      OT SHORT TERM GOAL #1   Title  Patient will be educated on strategies to improve psychosocial skills needed to participate fully in all daily, work, and leisure activities.    Time  3    Period  Weeks    Status  On-going      OT SHORT TERM GOAL #2   Title  Patient will be educated on a HEP and independent with implementation of HEP.    Time  3    Period  Weeks    Status  On-going      OT SHORT TERM GOAL #3   Title  Patient will independently apply psychosocial skills and coping mechanisms to her daily activities in order to function independently.  Time  3    Period  Weeks    Status  On-going                 Patient will benefit from skilled therapeutic intervention in order to improve the following deficits and impairments:  Other (comment)(decreased coping skills, decreased psychosocial skills)  Visit Diagnosis: Difficulty coping  Severe recurrent major depression without psychotic features ([place])  Personality change due to known physiological condition    Problem List Patient Active Problem List   Diagnosis Date Noted  . Hoarseness of voice 10/04/2015  . Tachycardia 08/30/2015  . Leukocytosis 02/08/2015  . Other specified hypothyroidism 02/08/2015  . MDD (major depressive disorder), recurrent episode, severe ([place]) 12/01/2013  . Substance induced mood disorder ([place]) 04/11/2012    Class: Acute  . Severe episode of  recurrent major depressive disorder ([place]) 04/09/2012    Class: Acute  . Encounter for long-term (current) use of other medications 02/21/2011  . Routine general medical examination at a health care facility 02/21/2011  . Keratosis pilaris 02/21/2011   Guadelupe Sabin, OTR/L  [phone] 07/30/2017, 3:51 PM  [facility] PARTIAL HOSPITALIZATION PROGRAM [place], [place], [address] Phone: [phone]   Fax:  [phone]  Name: Raye Slyter MRN: [id number] Date of Birth: 1990-08-10

## 2017-07-30 NOTE — Psych (Signed)
   [facility] PHP THERAPIST PROGRESS NOTE  Alexander Harrington [id number]  Session Time: 9-2  Participation Level: Active  Behavioral Response: CasualAlertAnxious and Depressed  Type of Therapy: Group Therapy, Psychoeducation, Psychotherapy, Activity Therapy  Treatment Goals addressed: Coping  Interventions: CBT, DBT, Supportive and Reframing  Summary:  9:00 - 10:30: Clinician led check-in regarding current stressors and situation, and review of patient completed daily inventory. Clinician utilized active listening and empathetic response and validated patient emotions. Clinician facilitated processing group on pertinent issues.  10:30 -12:00: Clinician introduced topic of "Feelings and Emotions". Patients discussed how hard emotions and feelings can be to identify, accept, and handle. Group discussed the purpose of feelings and emotions. Group discussed the different parts of the mind (wise, rational, emotional) and how these parts affect the way we handle feelings and emotions.  12:00 - 12:45: Reflection group: Patients encouraged to practice skills and interpersonal techniques or work on mindfulness and relaxation techniques. The importance of self-care and making skills part of a routine to increase usage were stressed.  12:45-1:50: Clinician continued with topic of "Feelings and Emotions." Clinician led feelings [name] game to aid in identification of feelings.  1:50 - 2:00: Clinician led check-out. Clinician assessed for immediate needs, medication compliance and efficacy, and safety concerns.   Suicidal/Homicidal: Nowithout intent/plan  Therapist Response: [name] is a 27 y.o. male who presents with depression and anxiety symptoms, as well as SI and self-harm. Patient arrived within time allowed and reports he is feeling "ugh." Patient rates his mood at a 6 on a 1- 10 scale with 10 being great. Pt reports he had suicidal thoughts over the weekend but was able to distract himself for the  most part. Pt reports being alone is a trigger for SI. Patient engaged in activity and discussion. Patient demonstrates some progress as evidenced by increased insight into triggers. Patient denies SI/HI/self-harm thoughts at end of session.  Plan: Patient will continue in PHP and medication management while working on decreasing depression and anxiety symptoms, SI and self-harm, and increasing distress tolerance skills and emotional regulation.  Diagnosis: Bipolar 1 disorder, depressed, moderate ([place]) [F31.32]    1. Bipolar 1 disorder, depressed, moderate ([place])     [name] J Amrit Erck, LPCA 07/30/2017

## 2017-07-31 ENCOUNTER — Other Ambulatory Visit (HOSPITAL_COMMUNITY): Payer: BC Managed Care – PPO | Admitting: Licensed Clinical Social Worker

## 2017-07-31 DIAGNOSIS — F3132 Bipolar disorder, current episode depressed, moderate: Secondary | ICD-10-CM

## 2017-07-31 DIAGNOSIS — F332 Major depressive disorder, recurrent severe without psychotic features: Secondary | ICD-10-CM | POA: Diagnosis not present

## 2017-07-31 NOTE — Progress Notes (Signed)
GROUP NOTE - spiritual care group 07/31/2017 11:00 - 12:00 ?Facilitated by Simone Curia, MDiv      Group focused on topic of strength. ?Group members reflected on what thoughts and feelings emerge when they hear this topic. ?They then engaged in therapeutic art activity. ?Reflected on The topic of strength and represented what strength had been to them in their lives (images and patterns given) and what they saw as helpful in their life now. ?What they needed / wanted. ??Engaged in facilitated sharing of insights from activity.  Activity drew on narrative framework  Alexander Harrington was present throughout group.  Engaged in activity and facilitated discussion.  Related to group around noting "societal" messages of physical strength.  Stated that he hopes strength to be the ability to be creative and to not worry about other's judgment.    WL / Kirk, MDiv

## 2017-08-01 ENCOUNTER — Other Ambulatory Visit (HOSPITAL_COMMUNITY): Payer: BC Managed Care – PPO | Admitting: Specialist

## 2017-08-01 ENCOUNTER — Encounter (HOSPITAL_COMMUNITY): Payer: Self-pay

## 2017-08-01 ENCOUNTER — Other Ambulatory Visit (HOSPITAL_COMMUNITY): Payer: BC Managed Care – PPO | Admitting: Licensed Clinical Social Worker

## 2017-08-01 VITALS — BP 118/80 | HR 81 | Ht 70.0 in | Wt 226.0 lb

## 2017-08-01 DIAGNOSIS — F070 Personality change due to known physiological condition: Secondary | ICD-10-CM

## 2017-08-01 DIAGNOSIS — F332 Major depressive disorder, recurrent severe without psychotic features: Secondary | ICD-10-CM | POA: Diagnosis not present

## 2017-08-01 DIAGNOSIS — R4589 Other symptoms and signs involving emotional state: Secondary | ICD-10-CM

## 2017-08-01 DIAGNOSIS — F900 Attention-deficit hyperactivity disorder, predominantly inattentive type: Secondary | ICD-10-CM

## 2017-08-01 MED ORDER — AMPHETAMINE-DEXTROAMPHETAMINE 10 MG PO TABS
10.0000 mg | ORAL_TABLET | Freq: Two times a day (BID) | ORAL | 0 refills | Status: DC
Start: 2017-08-01 — End: 2018-09-22

## 2017-08-01 NOTE — Psych (Signed)
   [facility] PHP THERAPIST PROGRESS NOTE  Alexander Harrington [id number]  Session Time: 9-2  Participation Level: Active  Behavioral Response: CasualAlertAnxious and Depressed  Type of Therapy: Group Therapy, Psychoeducation, Psychotherapy, Activity Therapy, spiritual care  Treatment Goals addressed: Coping  Interventions: CBT, DBT, Supportive and Reframing  Summary:  9:00 - 10:30 Clinician led check-in regarding current stressors and situation, and review of patient completed daily inventory. Clinician utilized active listening and empathetic response and validated patient emotions. Clinician facilitated processing group on pertinent issues.  10:30 -12:00 Spiritual care group  12:00 - 12:45 Reflection group: Patients encouraged to practice skills and interpersonal techniques or work on mindfulness and relaxation techniques. The importance of self-care and making skills part of a routine to increase usage were stressed.  12:45 - 1:50 Relaxation group: Cln [name] led yoga group focused on retraining the body's response to stress.  1:50 - 2:00 Clinician led check-out. Clinician assessed for immediate needs, medication compliance and efficacy, and safety concerns.    Suicidal/Homicidal: Nowithout intent/plan  Therapist Response: Alexander Harrington is a 27 y.o. male who presents with depression and anxiety symptoms, as well as SI and self-harm. Patient arrived within time allowed and reports he is feeling "average." Patient rates his mood at a 6 on a 1- 10 scale with 10 being great. Pt reports he felt restless after group yesterday so stayed out and about to focus on other things. Pt reports he was ruminating and talked to a friend which helped. Pt identified struggles with life not being what he thinks it should be. Pt was resistant to cln pointing this out and maintained an external view of his problems. Patient engaged in activity and discussion. Patient demonstrates some progress as evidenced by  trying out strategies discussed in group. Patient denies SI/HI/self-harm thoughts at end of session.  Plan: Patient will continue in PHP and medication management while working on decreasing depression and anxiety symptoms, SI and self-harm, and increasing distress tolerance skills and emotional regulation.  Diagnosis: Bipolar 1 disorder, depressed, moderate ([place]) [F31.32]    1. Bipolar 1 disorder, depressed, moderate ([place])     Lorin Glass, LCSW 08/01/2017

## 2017-08-01 NOTE — Progress Notes (Signed)
Patient presents with flat affect, continued depressed mood but reports he has noticed his mood has "been better".  Patient reports continued problems with focus and not sleeping as well at night at times when he does not take Ambien. States he tries not to take this every night but does use sometimes to sleep more sound.  Patient scored his depression a 4, anxiety a 3, and hopelessness a 6 on a scale of 0-10 with 0 being none and 10 the worst he could possibly manage.  Patient scored a 17 on his PHQ9 depression screening, down from 22 on 07/25/17 and denies any current suicidal or homicidal ideations. Patient met with this nurse with Dr. Lovena Le to discuss medications as he agreed with plan to try Adderall 10 mg, one twice a day if needed to help with focus and will report if any increase or problems with anxiety as he states this has been better too recently.  Patient denies any suicidal or homicidal ideations, no auditory or visual hallucinations and reports liking PHP as he admits to learning and now using some distracting techniques to help when he has suicidal ideations.  Patient reported liking the routine of attending PHP and admits even though he is often tired when he comes in, he likes to get going and thinks the program is helping.  Patient agreed to also try MD suggested light therapy to help with mood and alertness in the morning and to help with sleep cycle regulation.  Dr. Lovena Le provided Adderrall order for patient and patient will contact this nurse if any problems with medication changes or increase in symptoms.  Patient will try taking Symbyax earlier in the night to also potentially help with sleep patter regulation.

## 2017-08-01 NOTE — Progress Notes (Signed)
Progress note      Alexander Harrington says the groups seem to be helping.  He does feel less depressed and anxious but complains of having trouble focusing and remembering.  He has a history of ADHD and the stimulants made him more anxious so they were discontinued.  He is willing to try them again if they will help.  He also complains of no energy and still poor motivation and they should help with those symptoms as well.  Plan:  Try Adderall 10 mg bid as needed

## 2017-08-01 NOTE — Psych (Signed)
   [facility] PHP THERAPIST PROGRESS NOTE  Alexander Harrington [id number]  Session Time: 9-2  Participation Level: Active  Behavioral Response: CasualAlertAnxious and Depressed  Type of Therapy: Group Therapy, Psychoeducation, Psychotherapy, Activity Therapy, OT  Treatment Goals addressed: Coping  Interventions: CBT, DBT, Supportive and Reframing  Summary:  9:00 - 10:30: Clinician led check-in regarding current stressors and situation, and review of patient completed daily inventory. Clinician utilized active listening and empathetic response and validated patient emotions. Clinician facilitated processing group on pertinent issues.  10:30 -12:00: Clinician introduced topic of "Feelings and Emotions". Patients discussed how hard emotions and feelings can be to identify, accept, and handle. Group discussed the purpose of feelings and emotions. Group discussed the different parts of the mind (wise, rational, emotional) and how these parts affect the way we handle feelings and emotions.  12:00 - 12:45: Reflection group: Patients encouraged to practice skills and interpersonal techniques or work on mindfulness and relaxation techniques. The importance of self-care and making skills part of a routine to increase usage were stressed.  12:45-1:50: Clinician continued with topic of "Feelings and Emotions." Clinician led feelings [name] game to aid in identification of feelings.  1:50 - 2:00: Clinician led check-out. Clinician assessed for immediate needs, medication compliance and efficacy, and safety concerns.   Suicidal/Homicidal: Nowithout intent/plan  Therapist Response: Alexander Harrington is a 27 y.o. male who presents with depression and anxiety symptoms, as well as SI and self-harm. Patient arrived within time allowed and reports he is feeling "ok-ish." Patient rates his mood at a 6 on a 1- 10 scale with 10 being great. Pt reports he had some hopelessness last night. Pt reports he got in his head and began  to worry about his purpose in life. Pt processed this in group. Pt identified struggling with self doubt and states he can use check the facts to address this. Patient engaged in activity and discussion. Patient demonstrates some progress as evidenced by insight into his struggles. Patient denies SI/HI/self-harm thoughts at end of session.  Plan: Patient will continue in PHP and medication management while working on decreasing depression and anxiety symptoms, SI and self-harm, and increasing distress tolerance skills and emotional regulation.  Diagnosis: Bipolar 1 disorder, depressed, moderate ([place]) [F31.32]    1. Bipolar 1 disorder, depressed, moderate ([place])     Lorin Glass, LCSW 08/01/2017

## 2017-08-01 NOTE — Psych (Signed)
   [facility] PHP THERAPIST PROGRESS NOTE  [name] [id number]  Session Time: 9-2  Participation Level: Active  Behavioral Response: CasualAlertAnxious and Depressed  Type of Therapy: Group Therapy, Psychoeducation, Psychotherapy, Activity Therapy, OT  Treatment Goals addressed: Coping  Interventions: CBT, DBT, Supportive and Reframing  Summary:  9:00 - 10:30 Clinician led check-in regarding current stressors and situation, and review of patient completed daily inventory. Clinician utilized active listening and empathetic response and validated patient emotions. Clinician facilitated processing group on pertinent issues.  10:30 - 11:30: OT  11:30-12:15: Group discussed how to work Arts development officer into [employer] when feelings are making it difficult.  12:15 -1:00: Reflection group: Patients encouraged to practice skills and interpersonal techniques or work on mindfulness and relaxation techniques. The importance of self-care and making skills part of a routine to increase usage were stressed.  1:00 - 1:50: Clinician introduced topic of "Boundaries". Group discussed the different types of boundaries (porous, rigid, healthy), different environments where boundaries may differ, and the different categories of boundaries.  1:50 - 2:00 Clinician led check-out. Clinician assessed for immediate needs, medication compliance and efficacy, and safety concerns.     Suicidal/Homicidal: Nowithout intent/plan  Therapist Response: Alexander Harrington is a 27 y.o. male who presents with depression and anxiety symptoms, as well as SI and self-harm. Patient arrived within time allowed and reports he is feeling "in between." Patient rates his mood at a 6 on a 1- 10 scale with 10 being great. Pt reports continued struggles at night. Pt reports that is the time his thinking runs away from him. Pt shares he is struggling with a friend who is overly reliant on him and works with group on how to address that. Patient engaged  in activity and discussion. Patient demonstrates some progress as evidenced by reporting increased mood for a greater portion of the day. Patient denies SI/HI/self-harm thoughts at end of session.  Plan: Patient will continue in PHP and medication management while working on decreasing depression and anxiety symptoms, SI and self-harm, and increasing distress tolerance skills and emotional regulation.  Diagnosis: Severe recurrent major depression without psychotic features ([place]) [F33.2]    1. Severe recurrent major depression without psychotic features ([place])   2. Attention deficit hyperactivity disorder (ADHD), predominantly inattentive type     Lorin Glass, LCSW 08/01/2017

## 2017-08-02 ENCOUNTER — Encounter (HOSPITAL_COMMUNITY): Payer: Self-pay | Admitting: Psychiatry

## 2017-08-02 ENCOUNTER — Other Ambulatory Visit: Payer: Self-pay

## 2017-08-02 ENCOUNTER — Other Ambulatory Visit (HOSPITAL_COMMUNITY): Payer: BC Managed Care – PPO | Admitting: Licensed Clinical Social Worker

## 2017-08-02 DIAGNOSIS — F332 Major depressive disorder, recurrent severe without psychotic features: Secondary | ICD-10-CM | POA: Diagnosis not present

## 2017-08-02 DIAGNOSIS — F3132 Bipolar disorder, current episode depressed, moderate: Secondary | ICD-10-CM

## 2017-08-02 NOTE — Therapy (Signed)
[place] [place], [place], [address] Phone: [phone]   Fax:  [phone]  Occupational Therapy Treatment  Patient Details  Name: Alexander Harrington MRN: [id number] Date of Birth: 1990/07/24 Referring Provider: Dr. Donnelly Angelica   Encounter Date: 08/01/2017  OT End of Session - 08/02/17 1022    Visit Number  3    Number of Visits  8    Date for OT Re-Evaluation  08/15/17    Authorization Type  BCBS STATE PPO    OT Start Time  1030    OT Stop Time  1130    OT Time Calculation (min)  60 min    Activity Tolerance  Patient tolerated treatment well    Behavior During Therapy  [facility] for tasks assessed/performed       Past Medical History:  Diagnosis Date  . Anxiety    Dr. Toy Care  . Bipolar 2 disorder ([place])   . Depression     History reviewed. No pertinent surgical history.  There were no vitals filed for this visit.  Subjective Assessment - 08/02/17 1022    Currently in Pain?  No/denies         S:  I think if I can get my sleep schedule under control, I will have improved time management skills.  O:  Patient participated in skilled OT group focusing on time management this date.  Group consisted of warm up activity relating how you would spend $86,400 if you had it and couldn't have any left over at end of day - as we have 86,400 seconds in a day and need to spend them wisely.  Group discussed if time can be managed vs activities in a day being managed.  Group was educated on effects of good time management ( improved focus, decision making, success, decreased stress, increased free time) as well as effects of inefficient time management ( inefficiency, increased stress, and poor quality work).  Group discussed common time thieves and possible solutions, and finished with education on tips to improve time management ( know how you currently spend time, set priorities, use a scheduling tool, schedule time  appropriately, get organized, delegate, avoid procrastination, manage time wasters, avoid multitasking, stay healthy).  Group then participated in a puzzle activity in which they were given puzzle pieces without the large finished picture and related back to how this is similar to not having a larger picture of a structured day.  Group concluded with members identifying one new time management strategy they plan to implement into daily routine.  A: Alexander Harrington is dealing with too much free time and feels aimless.  Therapist discussed using a daily block schedule for daily activities, wake time, sleep time, and time out of the house for improved efficiency and overall sense of well being.   P: Continue skilled OT group intervention with focus of next group on financial management strategies.                     OT Education - 08/02/17 1022    Education provided  Yes    Education Details  educated on time management strategies     Person(s) Educated  Patient    Methods  Explanation;Handout    Comprehension  Verbalized understanding       OT Short Term Goals - 07/30/17 1550      OT SHORT TERM GOAL #1   Title  Patient will be educated on strategies to  improve psychosocial skills needed to participate fully in all daily, work, and leisure activities.    Time  3    Period  Weeks    Status  On-going      OT SHORT TERM GOAL #2   Title  Patient will be educated on a HEP and independent with implementation of HEP.    Time  3    Period  Weeks    Status  On-going      OT SHORT TERM GOAL #3   Title  Patient will independently apply psychosocial skills and coping mechanisms to her daily activities in order to function independently.    Time  3    Period  Weeks    Status  On-going               Plan - 08/02/17 1022    Rehab Potential  Good    OT Treatment/Interventions  Self-care/ADL training coping skills and psychosocial skills training       Patient will benefit from  skilled therapeutic intervention in order to improve the following deficits and impairments:  (decreased coping skills, decreased psychosocial skills )  Visit Diagnosis: Severe recurrent major depression without psychotic features ([place])  Difficulty coping  Personality change due to known physiological condition    Problem List Patient Active Problem List   Diagnosis Date Noted  . Hoarseness of voice 10/04/2015  . Tachycardia 08/30/2015  . Leukocytosis 02/08/2015  . Other specified hypothyroidism 02/08/2015  . MDD (major depressive disorder), recurrent episode, severe ([place]) 12/01/2013  . Substance induced mood disorder ([place]) 04/11/2012    Class: Acute  . Severe episode of recurrent major depressive disorder ([place]) 04/09/2012    Class: Acute  . Encounter for long-term (current) use of other medications 02/21/2011  . Routine general medical examination at a health care facility 02/21/2011  . Keratosis pilaris 02/21/2011    Vangie Bicker, [place], OTR/L [phone]  08/02/2017, 10:24 AM  [facility] HOSPITALIZATION PROGRAM [place], [place], [address] Phone: [phone]   Fax:  [phone]  Name: Alexander Harrington MRN: [id number] Date of Birth: 1991-06-09

## 2017-08-02 NOTE — Psych (Signed)
   [facility] PHP THERAPIST PROGRESS NOTE  Alexander Harrington [id number]  Session Time: 9-1  Participation Level: Active  Behavioral Response: CasualAlertAnxious and Depressed  Type of Therapy: Group Therapy, Psychoeducation, Psychotherapy, Activity Therapy  Treatment Goals addressed: Coping  Interventions: CBT, DBT, Supportive and Reframing  Summary: 9:00 - 10:30 Clinician led check-in regarding current stressors and situation, and review of patient completed daily inventory. Clinician utilized active listening and empathetic response and validated patient emotions. Clinician facilitated processing group on pertinent issues.  10:30-11:15: Clinician led group discussion on scheduling and ways to keep from isolating self.  11:15 - 12:00: Clinician continued with topic of "Boundaries" from the previous day. Clinician introduced topic of how to set and maintain boundaries. Group identified areas in life where boundaries need to be examined and changed.  12:00 - 12:50 Group viewed TED talk "100 days of Rejection" and clinician led discussion on ways in which fear of rejection can hold [place] back.  12:50 - 1:00 Clinician led check-out. Clinician assessed for immediate needs, medication compliance and efficacy, and safety concerns.    Suicidal/Homicidal: Nowithout intent/plan  Therapist Response: Alexander Harrington is a 27 y.o. male who presents with depression and anxiety symptoms, as well as SI and self-harm. Patient arrived within time allowed and reports he is feeling "alright." Patient rates his mood at a 6 on a 1- 10 scale with 10 being great. Pt reports he had SI thoughts last night but was able to manage them with some distraction and a nap.  Pt reports he worries about "unravailing the progress I'm making here when I leave." Group spent time discussing how to continue progress after group, such as attending support groups and going to individual therapy. Patient engaged in activity and discussion.  Patient demonstrates some progress as evidenced by reporting use of skills to manage SI. Patient denies SI/HI/self-harm thoughts at end of session.  Plan: Patient will continue in PHP and medication management while working on decreasing depression and anxiety symptoms, SI and self-harm, and increasing distress tolerance skills and emotional regulation.  Diagnosis: Bipolar 1 disorder, depressed, moderate ([place]) [F31.32]    1. Bipolar 1 disorder, depressed, moderate ([place])     Alexander Harrington J Marisah Laker, LPCA 08/02/2017

## 2017-08-05 ENCOUNTER — Other Ambulatory Visit (HOSPITAL_COMMUNITY): Payer: BC Managed Care – PPO | Admitting: Licensed Clinical Social Worker

## 2017-08-05 DIAGNOSIS — F332 Major depressive disorder, recurrent severe without psychotic features: Secondary | ICD-10-CM | POA: Diagnosis not present

## 2017-08-05 DIAGNOSIS — F3132 Bipolar disorder, current episode depressed, moderate: Secondary | ICD-10-CM

## 2017-08-06 ENCOUNTER — Other Ambulatory Visit (HOSPITAL_COMMUNITY): Payer: BC Managed Care – PPO | Admitting: Licensed Clinical Social Worker

## 2017-08-06 ENCOUNTER — Encounter (HOSPITAL_COMMUNITY): Payer: Self-pay | Admitting: Occupational Therapy

## 2017-08-06 ENCOUNTER — Other Ambulatory Visit (HOSPITAL_COMMUNITY): Payer: BC Managed Care – PPO | Admitting: Occupational Therapy

## 2017-08-06 DIAGNOSIS — R4589 Other symptoms and signs involving emotional state: Secondary | ICD-10-CM

## 2017-08-06 DIAGNOSIS — F3132 Bipolar disorder, current episode depressed, moderate: Secondary | ICD-10-CM

## 2017-08-06 DIAGNOSIS — F332 Major depressive disorder, recurrent severe without psychotic features: Secondary | ICD-10-CM | POA: Diagnosis not present

## 2017-08-06 DIAGNOSIS — F070 Personality change due to known physiological condition: Secondary | ICD-10-CM

## 2017-08-06 NOTE — Therapy (Signed)
[place], [place], [address] Phone: [phone]   Fax:  [phone]  Occupational Therapy Treatment  Patient Details  Name: Alexander Harrington MRN: [id number] Date of Birth: [birth year] Referring Provider: Dr. Donnelly Harrington   Encounter Date: 08/06/2017  OT End of Session - 08/06/17 1729    Visit Number  4    Number of Visits  8    Date for OT Re-Evaluation  08/15/17    Authorization Type  BCBS STATE PPO    OT Start Time  1030    OT Stop Time  1130    OT Time Calculation (min)  60 min    Activity Tolerance  Patient tolerated treatment well    Behavior During Therapy  [facility] for tasks assessed/performed       Past Medical History:  Diagnosis Date  . Anxiety    Dr. Toy Harrington  . Bipolar 2 disorder ([place])   . Depression     History reviewed. No pertinent surgical history.  There were no vitals filed for this visit.  Subjective Assessment - 08/06/17 1729    Currently in Pain?  No/denies         [facility] OT Assessment - 08/06/17 1728      Assessment   Medical Diagnosis  Difficulty Coping due to MDD      Precautions   Precautions  None         OT Group: Financial Management  S: "I didn't do that sleep chart." O:  Patient actively participated in the following skilled occupational therapy treatment session this date:             Financial management-Discussed the importance of good financial management in all  areas of life and importance of saving. Pt participated in group discussion regarding current methods of budgeting and if these are effective or ineffective. She participated in identifying the "big five" necessities for financial living (food, shelter, insurance, taxes, and Transportation). Pt participated in group budgeting activity in which teams were given an income scenario and asked to budget for a month taking into account multiple variables (house/car/insurance/daycare/phone/etc). Teams  then shared their developed budget with the group and discussed. She participated in group discussion on ways to save and how to develop a budget and spending plan-including knowing what you are bringing in (income after taxes) and knowing your fixed and variable expenses that will be going out and how to manage these areas within a budget.  A:  Patient participated in skilled occupational therapy group for financial management skills this date.  Patient was engaged and open to strategies introduced. Pt provided with Budget Binder 2018 for beginning budgeting.  P:  Continue participation in skilled occupational therapy groups 1-2 times per week for 2 weeks in order to gain the necessary skills needed to return to full time community living and learn effective coping strategies to be a productive community resident. Follow up on HEP for developing a budget or savings plan.        OT Short Term Goals - 07/30/17 1550      OT SHORT TERM GOAL #1   Title  Patient will be educated on strategies to improve psychosocial skills needed to participate fully in all daily, work, and leisure activities.    Time  3    Period  Weeks    Status  On-going      OT SHORT TERM GOAL #2   Title  Patient will be  educated on a HEP and independent with implementation of HEP.    Time  3    Period  Weeks    Status  On-going      OT SHORT TERM GOAL #3   Title  Patient will independently apply psychosocial skills and coping mechanisms to her daily activities in order to function independently.    Time  3    Period  Weeks    Status  On-going                 Patient will benefit from skilled therapeutic intervention in order to improve the following deficits and impairments:  Other (comment)(decreased coping skills, decreased psychosocial skills)  Visit Diagnosis: Difficulty coping  Personality change due to known physiological condition  Severe recurrent major depression without psychotic features  [facility])    Problem List Patient Active Problem List   Diagnosis Date Noted  . Hoarseness of voice 10/04/2015  . Tachycardia 08/30/2015  . Leukocytosis 02/08/2015  . Other specified hypothyroidism 02/08/2015  . MDD (major depressive disorder), recurrent episode, severe ([place]) 12/01/2013  . Substance induced mood disorder ([place]) 04/11/2012    Class: Acute  . Severe episode of recurrent major depressive disorder ([place]) 04/09/2012    Class: Acute  . Encounter for long-term (current) use of other medications 02/21/2011  . Routine general medical examination at a health [facility] facility 02/21/2011  . Keratosis pilaris 02/21/2011   Alexander Harrington, OTR/L  [phone] 08/06/2017, 5:29 PM  [facility] PARTIAL HOSPITALIZATION PROGRAM [place], [place], [address] Phone: [phone]   Fax:  [phone]  Name: Alexander Harrington MRN: [id number] Date of Birth: [birth year]

## 2017-08-06 NOTE — Psych (Signed)
   [facility] PHP THERAPIST PROGRESS NOTE  Dail Lerew [id number]  Session Time: 9-2  Participation Level: Active  Behavioral Response: CasualAlertDepressed  Type of Therapy: Group Therapy, Psychoeducation, Psychotherapy, Activity Therapy, Medication group  Treatment Goals addressed: Coping  Interventions: CBT, DBT, Supportive and Reframing  Summary:  9:00 - 10:30: Pharmacist discussed medication with group and answered questions.  10:30 -12:00: Clinician led check-in regarding current stressors and situation, and review of patient completed daily inventory. Clinician utilized active listening and empathetic response and validated patient emotions. Clinician facilitated processing group on pertinent issues.  12:00 - 12:45 Reflection group: Patients encouraged to practice skills and interpersonal techniques or work on mindfulness and relaxation techniques. The importance of self-care and making skills part of a routine to increase usage were stressed.  12:45 - 1:50 Clinician introduced topic of "Distress Tolerance". Clinician discussed "TIPS" and "STOP" and how/when patients can employ these methods to help. Patients identified when these techniques may be helpful in their personal lives.  1:50 - 2:00 Clinician led check-out. Clinician assessed for immediate needs, medication compliance and efficacy, and safety concerns.    Suicidal/Homicidal: Nowithout intent/plan  Therapist Response: Zion Ta is a 27 y.o. male who presents with depression and anxiety symptoms, as well as SI and self-harm. Patient arrived within time allowed and reports he is feeling "fairly decent." Patient rates his mood at a 7 on a 1- 10 scale with 10 being great. Pt states his weekend was full until Sunday and that was the hardest day. Pt reports continued struggles with managing alone time and not letting it affect his mood. Patient engaged in activity and discussion. Patient demonstrates some progress as evidenced  by stating practicing mindfulness over the weekend. Patient denies SI/HI/self-harm thoughts at end of session.  Plan: Patient will continue in PHP and medication management while working on decreasing depression and anxiety symptoms, SI and self-harm, and increasing distress tolerance skills and emotional regulation.  Diagnosis: Bipolar 1 disorder, depressed, moderate ([place]) [F31.32]    1. Bipolar 1 disorder, depressed, moderate ([place])     Lorin Glass, LCSW 08/06/2017

## 2017-08-07 ENCOUNTER — Encounter (HOSPITAL_COMMUNITY): Payer: Self-pay

## 2017-08-07 ENCOUNTER — Other Ambulatory Visit (HOSPITAL_COMMUNITY): Payer: BC Managed Care – PPO | Admitting: Licensed Clinical Social Worker

## 2017-08-07 DIAGNOSIS — F3130 Bipolar disorder, current episode depressed, mild or moderate severity, unspecified: Secondary | ICD-10-CM | POA: Insufficient documentation

## 2017-08-07 DIAGNOSIS — F332 Major depressive disorder, recurrent severe without psychotic features: Secondary | ICD-10-CM | POA: Diagnosis not present

## 2017-08-07 NOTE — Psych (Signed)
   [facility] PHP THERAPIST PROGRESS NOTE  Alexander Harrington [id number]  Session Time: 9-2  Participation Level: Active  Behavioral Response: CasualAlertDepressed  Type of Therapy: Group Therapy, Psychoeducation, Psychotherapy, Activity Therapy, OT  Treatment Goals addressed: Coping  Interventions: CBT, DBT, Supportive and Reframing  Summary:  9:00 - 10:30 Clinician led check-in regarding current stressors and situation, and review of patient completed daily inventory. Clinician utilized active listening and empathetic response and validated patient emotions. Clinician facilitated processing group on pertinent issues.  10:30 -11:30: OT Group  11:30-12:15: Clinician led psychoeducation group on creating goals centering around your passions/joys.  12:15 - 1:00: Reflection group: Patients encouraged to practice skills and interpersonal techniques or work on mindfulness and relaxation techniques. The importance of self-care and making skills part of a routine to increase usage were stressed.  1:00 - 1:50 Clinician continued with topic of "Distress Tolerance". Clinician discussed "Accepts" skill and how/when patients can employ this method to help. Patients identified when these techniques may be helpful in their personal lives.  1:50 - 2:00 Clinician led check-out. Clinician assessed for immediate needs, medication compliance and efficacy, and safety concerns.  Suicidal/Homicidal: Nowithout intent/plan  Therapist Response: Alexander Harrington is a 27 y.o. male who presents with depression and anxiety symptoms, as well as SI and self-harm. Patient arrived within time allowed and reports he is feeling "really bad." Patient rates his mood at a 3 on a 1- 10 scale with 10 being great. Pt states he had a conflict with his parents last night regarding them wanting to make more progress and follow a different timeline. Pt states he felt hopeless, and "like a failure" after the conflict and he experienced some SI  with no plan/intent. Pt reports speaking to a friend and watching a movie to manage these thoughts and feelings. Pt denies SI/HI today. Patient engaged in activity and discussion. Patient demonstrates some progress as evidenced by increasing mood within group by engaging about a favorite topic. Patient denies SI/HI/self-harm thoughts at end of session.  Plan: Patient will continue in PHP and medication management while working on decreasing depression and anxiety symptoms, SI and self-harm, and increasing distress tolerance skills and emotional regulation.  Diagnosis: Bipolar 1 disorder, depressed, moderate ([place]) [F31.32]    1. Bipolar 1 disorder, depressed, moderate ([place])     Lorin Glass, LCSW 08/07/2017

## 2017-08-07 NOTE — Progress Notes (Signed)
Progress note   Mr Plog says he is doing okay.  Putting into practice some of the coping skills he is learning.  Struggling with his parents who he perceives as wanting to rush him to be the way they want him to be.  Says he is trying to not be making plans but to concentrate on what he needs to do to feel better and also to feel better about himself.  Had a disagreement with them last night that upset him but is trying to not let it get him down too much.  Likes the relationship with some of the newer group members who share common interests.  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  Feeling some better about writing. Plan is to leave medications as they are as they are probably helping and not causing any side effects

## 2017-08-07 NOTE — Progress Notes (Signed)
Patient presents with flat affect, depressed mood and denies any suicidal or homicidal ideations at this time, no plan or intent to harm self or others but admits he still experiences times when he has passive thoughts of suicide.  Patient stated this typically occurs when he has an argument or disagreement with his parents, as he did earlier this week but resides usually within a few hours.  States he is using distraction and reframming techniques to help with this impulsive SI thinking and tries not to "overreact".  Patient scored his current level of depression a 5, anxiety a 3 and hopelessness a 7 on a scale of 0-10 with 0 being none and 10 the worst he could stand.  States his hopelessness is more related to just not being sure what he wants to do with his life following treatment and admits to feeling the pressure at times from family and society to make a decision about his future and career.  Patient reported he and his family currently agreed he would focus on his treatment as he reported at times they want this to be faster.  Patient denied any current problems with medications, reports sleeping better, at least 6 hours a night and going to sleep better than previously with the change in the way he takes his night time medications.  Patient to contact this nurse or PHP staff if needs anything or if any changes or worsening of symptoms.

## 2017-08-07 NOTE — Psych (Signed)
   [facility] PHP THERAPIST PROGRESS NOTE  Alexander Harrington [id number]  Session Time: 9-2  Participation Level: Active  Behavioral Response: CasualAlertDepressed  Type of Therapy: Group Therapy, Psychoeducation, Psychotherapy, Activity Therapy, Spiritual care  Treatment Goals addressed: Coping  Interventions: CBT, DBT, Supportive and Reframing  Summary:  9:00 - 10:30 Clinician led check-in regarding current stressors and situation, and review of patient completed daily inventory. Clinician utilized active listening and empathetic response and validated patient emotions. Clinician facilitated processing group on pertinent issues.  10:30 -12:00 Spiritual care group  12:00 - 12:45 Reflection group: Patients encouraged to practice skills and interpersonal techniques or work on mindfulness and relaxation techniques. The importance of self-care and making skills part of a routine to increase usage were stressed.  12:45 - 1:50 Relaxation group: Cln [name] led yoga group focused on retraining the body's response to stress.  1:50 - 2:00 Clinician led check-out. Clinician assessed for immediate needs, medication compliance and efficacy, and safety concerns.    Suicidal/Homicidal: Nowithout intent/plan  Therapist Response: [name] is a 27 y.o. male who presents with depression and anxiety symptoms, as well as SI and self-harm. Patient arrived within time allowed and reports that he is feeling "tired." Patient rates his mood at a 5 on a 1-10 scale with 10 being great. Patient reports that he watched one of his favorite horror movies, Night of the Tedd Sias, and went to the grocery store. Patient shared that he had some thoughts about harming himself, but was able to listen to music and text friends to help with his thoughts. Patient engaged in activity and discussion. Patient demonstrates some progress as evidenced by stating that he is trying to spend less time on his phone and more time on his  computer where he feels more productive. Patient shared that keeping his hands busy with other things helps reduce his anxiety and racing thoughts. Patient denies SI/HI/self-harm at the end of group.   Plan: Patient will continue in PHP and medication management while working on decreasing depression and anxiety symptoms, SI and self-harm, and increasing distress tolerance skills and emotional regulation.  Diagnosis: No primary diagnosis found.    1. Bipolar I disorder, most recent episode depressed ([place])     Lorin Glass, LCSW 08/07/2017

## 2017-08-08 ENCOUNTER — Telehealth (HOSPITAL_COMMUNITY): Payer: Self-pay | Admitting: Licensed Clinical Social Worker

## 2017-08-08 ENCOUNTER — Ambulatory Visit (HOSPITAL_COMMUNITY): Payer: Self-pay

## 2017-08-08 ENCOUNTER — Other Ambulatory Visit (HOSPITAL_COMMUNITY): Payer: Self-pay

## 2017-08-09 ENCOUNTER — Other Ambulatory Visit (HOSPITAL_COMMUNITY): Payer: BC Managed Care – PPO | Admitting: Licensed Clinical Social Worker

## 2017-08-09 DIAGNOSIS — F332 Major depressive disorder, recurrent severe without psychotic features: Secondary | ICD-10-CM | POA: Diagnosis not present

## 2017-08-09 DIAGNOSIS — F3130 Bipolar disorder, current episode depressed, mild or moderate severity, unspecified: Secondary | ICD-10-CM

## 2017-08-09 NOTE — Psych (Signed)
   [facility] PHP THERAPIST PROGRESS NOTE  [name] [id number]  Session Time: 9-1  Participation Level: Active  Behavioral Response: CasualAlertDepressed  Type of Therapy: Group Therapy, Psychoeducation, Psychotherapy, Activity Therapy, Art therapy  Treatment Goals addressed: Coping  Interventions: CBT, DBT, Supportive and Reframing  Summary:  9:00 - 10:15: Clinician led check-in regarding current stressors and situation, and review of patient completed daily inventory. Clinician utilized active listening and empathetic response and validated patient emotions. Clinician facilitated processing group on pertinent issues.  10:15 -11:00: Group discussed friendships and the role they have in our lives and how to make and keep friends.  11:00 - 12:00: Clinician led art therapy activity on mind mapping.  12:00 - 12:50 Group viewed TED talk "Why some of [place] don't have one true calling" and discussed how the idea of a multipotentialite affects our idea of purpose.  12:50 -1:00 Clinician led check-out. Clinician assessed for immediate needs, medication compliance and efficacy, and safety concerns.   Suicidal/Homicidal: Nowithout intent/plan  Therapist Response: Alexander Harrington is a 27 y.o. male who presents with depression and anxiety symptoms, as well as SI and self-harm. Patient arrived within time allowed and reports that he is feeling "not great." Patient rates his mood at a 4 on a 1-10 scale with 10 being great. Patient reports two nights ago was rough for him and he felt hopeless and engaged in cutting. Pt states he did not really sleep and so slept most of yesterday. Pt reports he "tried" distress tolerance skills however also admitted to wallowing instead of trying to lift himself out of his mood. Pt processed how to utilize skills in a different way next time to increase effectivness. Patient engaged in activity and discussion. Patient demonstrates some progress as evidenced by stating he was  called in for an interview at a movie theater. Patient denies SI/HI/self-harm at the end of group.   Plan: Patient will continue in PHP and medication management while working on decreasing depression and anxiety symptoms, SI and self-harm, and increasing distress tolerance skills and emotional regulation.  Diagnosis: Bipolar I disorder, most recent episode depressed ([place]) [F31.30]    1. Bipolar I disorder, most recent episode depressed ([place])     Lorin Glass, LCSW 08/09/2017

## 2017-08-12 ENCOUNTER — Other Ambulatory Visit (HOSPITAL_COMMUNITY): Payer: BC Managed Care – PPO | Admitting: Licensed Clinical Social Worker

## 2017-08-12 DIAGNOSIS — F3130 Bipolar disorder, current episode depressed, mild or moderate severity, unspecified: Secondary | ICD-10-CM

## 2017-08-12 DIAGNOSIS — F332 Major depressive disorder, recurrent severe without psychotic features: Secondary | ICD-10-CM | POA: Diagnosis not present

## 2017-08-13 ENCOUNTER — Telehealth (HOSPITAL_COMMUNITY): Payer: Self-pay | Admitting: Licensed Clinical Social Worker

## 2017-08-13 ENCOUNTER — Other Ambulatory Visit (HOSPITAL_COMMUNITY): Payer: BC Managed Care – PPO | Admitting: Licensed Clinical Social Worker

## 2017-08-13 ENCOUNTER — Other Ambulatory Visit (INDEPENDENT_AMBULATORY_CARE_PROVIDER_SITE_OTHER): Payer: BC Managed Care – PPO | Admitting: Occupational Therapy

## 2017-08-13 DIAGNOSIS — F332 Major depressive disorder, recurrent severe without psychotic features: Secondary | ICD-10-CM | POA: Diagnosis not present

## 2017-08-13 DIAGNOSIS — R4589 Other symptoms and signs involving emotional state: Secondary | ICD-10-CM

## 2017-08-13 DIAGNOSIS — F319 Bipolar disorder, unspecified: Secondary | ICD-10-CM

## 2017-08-13 NOTE — Psych (Signed)
Individual Session 11:30-12:15  Cln met with pt due to pt walking out of group at 10:30. Pt was called and Sonia Baller E. left him a message. Pt returned to group around 11:15. Cln spoke with pt about why he left group. Pt reports he was feeling very hopeless and wanted to go home and sleep. Pt reports driving around helped him clear his head to understand that going home was not going to help, and may make things worse, so he returned to group. Pt reports he is struggling with the cycle of his depression, where he feels like he is getting better and then "crashes" and fails to see his progress. Cln and pt spent time discussing this cycle and how pt can remind self of progress. Cln challenged pt to create a list of what progress he has made over the last few weeks. Pt states he will do that tonight and cln will check back in with him tomorrow. Cln and pt spent time discussing cognitive distortions the pt often has, especially minimization and "should" statements, and how to combat these. Pt reports he also struggles with comparing himself to others that are "better off", specifically one person he used to be friends with. Cln and pt discussed the lives we see of others on social media vs. the "real world." Cln and pt discussed pt "unfollowing" the ex-friend in order to not be triggered by him. Cln and pt discussed writing as a therapeutic release for pt. Cln and pt spent time filling out a safety plan with triggers, coping skills, people to contact, and emergency numbers. Cln told pt that if he feels like he needs to leave group early again that we expect him to talk to [place] (clns) beforehand so we can help him/problem solve. Pt denies any SI/HI/AVH at the end of individual session.  Wadell Craddock, LPCA

## 2017-08-13 NOTE — Therapy (Signed)
[place], [place], [address] Phone: [phone]   Fax:  [phone]  Patient Details  Name: Alexander Harrington MRN: [id number] Date of Birth: 04/15/1991 Referring Provider:  Darreld Mclean, MD  Encounter Date: 08/13/2017   OT group cancellation. Pt left room prior to group beginning, LCSW alerted to pt leaving. Pt then returned at end of OT group. No charge for OT group due to pt absence and lack of participation.   Guadelupe Sabin, OTR/L  [phone] 08/13/2017, 5:35 PM  [place], [place], [address] Phone: [phone]   Fax:  [phone]

## 2017-08-13 NOTE — Progress Notes (Signed)
Progress note      Alexander Harrington is having a hard day.  Feeling depressed, tired and hopeless.  Had to get away to regroup and the driving around was helpful.  He does not always see how well he is using some of the skills he has learned and quick to think it all means nothing.  Feeling some better at the moment.  Anxious that his time is coming up for discharge he says. Does have a job and that is positive though he tends to play that success down as well.    Plan continue current meds.  Doubt extending his time will help much.  He is safe to go home in his opinion and mine.

## 2017-08-13 NOTE — Psych (Signed)
   [facility] PHP THERAPIST PROGRESS NOTE  Alexander Harrington [id number]  Session Time: 9-2  Participation Level: Active  Behavioral Response: CasualAlertDepressed  Type of Therapy: Group Therapy, Psychoeducation, Psychotherapy, Activity Therapy  Treatment Goals addressed: Coping  Interventions: CBT, DBT, Supportive and Reframing  Summary:  9:00 - 10:30 Clinician led check-in regarding current stressors and situation, and review of patient completed daily inventory. Clinician utilized active listening and empathetic response and validated patient emotions. Clinician facilitated processing group on pertinent issues.  10:30 12:00: Clinician led "back to back communication" exercise to introduce problems with perception and how it affects our reality. Pt's sat back to back from a partner and attempted to have their partner draw a picture only the pt can see.  12:00 - 12:45 Reflection group: Patients encouraged to practice skills and interpersonal techniques or work on mindfulness and relaxation techniques. The importance of self-care and making skills part of a routine to increase usage were stressed.  12:45 - 1:50 Clinician introduced topic of "Cognitive Distortions." Pts worked to identify common cognitive distortions they often experience and increase awareness of the ways they distortions present  1:50 - 2:00 Clinician led check-out. Clinician assessed for immediate needs, medication compliance and efficacy, and safety concerns.    Suicidal/Homicidal: Nowithout intent/plan  Therapist Response: Alexander Harrington is a 27 y.o. male who presents with depression and anxiety symptoms, as well as SI and self-harm. Patient arrived within time allowed and reports that he is feeling "kind of sad." Patient rates his mood at a 4 on a 1-10 scale with 10 being great. Pt states some passive SI over the weekend and denies plan or intent. Patient reports his mood was low throughout most of the weekend. Pt continues to  engage in wallowing when he is upset and states he watched movies that made him think about the futility of life and the sorrow of the human condition. Cln again reminded pt of utilizing the opposite emotion skill and that engaging in behaviors which keep or worsen his negative mood is unhelpful. Patient engaged in activity and discussion. Patient demonstrates lack of progress as evidenced by continuous lack of engagement at home with skills being taught and unwillingness to discuss meaningfully his feelings and situation. Patient denies SI/HI/self-harm at the end of group.   Plan: Patient will continue in PHP and medication management while working on decreasing depression and anxiety symptoms, SI and self-harm, and increasing distress tolerance skills and emotional regulation.  Diagnosis: Bipolar I disorder, most recent episode depressed ([place]) [F31.30]    1. Bipolar I disorder, most recent episode depressed ([place])     Lorin Glass, LCSW 08/13/2017

## 2017-08-13 NOTE — Psych (Signed)
   [facility] PHP THERAPIST PROGRESS NOTE  Alexander Harrington [id number]  Session Time: 9-2  Participation Level: Active  Behavioral Response: CasualAlertDepressed  Type of Therapy: Group Therapy, Psychoeducation, Psychotherapy, Activity Therapy, OT, individual therapy  Treatment Goals addressed: Coping  Interventions: CBT, DBT, Supportive and Reframing  Summary:  9:00 - 10:30 Clinician led check-in regarding current stressors and situation, and review of patient completed daily inventory. Clinician utilized active listening and empathetic response and validated patient emotions. Clinician facilitated processing group on pertinent issues.  10:30 -11:30: OT group  11:30-12:15: Clinician led discussion on emotion and that meaning is decided by on what we place importance.  12:15 - 1:00: Reflection group: Patients encouraged to practice skills and interpersonal techniques or work on mindfulness and relaxation techniques. The importance of self-care and making skills part of a routine to increase usage were stressed.  1:00 - 1:50 Clinician continued topic of "Cognitive Distortions." Pts continued to identify common cognitive distortions they often experience and ways to combat those distortions. Cln educated group on skills of checking the facts, feelings don't equal fact, and best friend test.  1:50 - 2:00 Clinician led check-out. Clinician assessed for immediate needs, medication compliance and efficacy, and safety concerns.    Suicidal/Homicidal: Nowithout intent/plan  Therapist Response: Kahmari Koller is a 27 y.o. male who presents with depression and anxiety symptoms, as well as SI and self-harm. Patient arrived within time allowed and reports that he is feeling "just here." Patient rates his mood at a 5 on a 1-10 scale with 10 being great. Pt reports he got the job for which he interviewed yesterday, however minimized his success. Pt reports watching a favorite documentary and seeing a friend  last night. Pt left for 45 minutes during OT without informing cln. Upon return, cln Whitney C. met with pt regarding this.  Patient engaged in activity and discussion. Patient minimal progress by recognizing that going home would be counterproductive for him. Patient denies SI/HI/self-harm at the end of group.   Plan: Patient will continue in PHP and medication management while working on decreasing depression and anxiety symptoms, SI and self-harm, and increasing distress tolerance skills and emotional regulation.  Diagnosis: Bipolar 1 disorder, depressed ([employer]) [F31.9]    1. Bipolar 1 disorder, depressed ([place])     Lorin Glass, LCSW 08/13/2017

## 2017-08-14 ENCOUNTER — Other Ambulatory Visit (HOSPITAL_COMMUNITY): Payer: BC Managed Care – PPO | Admitting: Professional

## 2017-08-14 ENCOUNTER — Encounter (HOSPITAL_COMMUNITY): Payer: Self-pay

## 2017-08-14 VITALS — BP 108/78 | HR 100 | Ht 69.75 in | Wt 228.0 lb

## 2017-08-14 DIAGNOSIS — F319 Bipolar disorder, unspecified: Secondary | ICD-10-CM

## 2017-08-14 DIAGNOSIS — F332 Major depressive disorder, recurrent severe without psychotic features: Secondary | ICD-10-CM | POA: Diagnosis not present

## 2017-08-14 NOTE — Progress Notes (Signed)
Patient presents with flat affect, depressed mood but reports he has not attempted to harm self or had these type of thoughts in more than a week.  Patient admitted he is a little apprehensive and nervous about his time in PHP coming to an end and starting a new job at a [employer].  Patient denied any current suicidal or homicidal ideations, no auditory or visual hallucinations and scored his current level of depression a 4-5, anxiety a 3,and hopelessness an 8 on a scale of 0-10 with 0 being none and 10 the worst he could manage, as states he is still afraid long-term things will not improve.  Patient reported using some distracting skills to help when he has fleeting thoughts of self-harm and is sleeping better most nights, 6-7 hours a night, since changing the times he takes his night time medication to around 7 pm.  Patient scored a 13 on is PHQ9 depression screening, down from 22 on 07/25/17. Patient agreed with plan to work with [facility] staff to prepare for transition and for using skills to manage normal anxiety with starting new things such as his new job.  Patient to contact this nurse or PHP staff if any problems or worsening of symptoms prior to ending PHP and is preparing accordingly for his follow up treatment services.

## 2017-08-15 ENCOUNTER — Other Ambulatory Visit (HOSPITAL_COMMUNITY): Payer: BC Managed Care – PPO | Admitting: Specialist

## 2017-08-15 ENCOUNTER — Encounter (HOSPITAL_COMMUNITY): Payer: Self-pay

## 2017-08-15 ENCOUNTER — Other Ambulatory Visit (HOSPITAL_COMMUNITY): Payer: BC Managed Care – PPO | Admitting: Professional

## 2017-08-15 DIAGNOSIS — R4589 Other symptoms and signs involving emotional state: Secondary | ICD-10-CM

## 2017-08-15 DIAGNOSIS — F332 Major depressive disorder, recurrent severe without psychotic features: Secondary | ICD-10-CM | POA: Diagnosis not present

## 2017-08-15 DIAGNOSIS — F3130 Bipolar disorder, current episode depressed, mild or moderate severity, unspecified: Secondary | ICD-10-CM

## 2017-08-15 DIAGNOSIS — F070 Personality change due to known physiological condition: Secondary | ICD-10-CM

## 2017-08-15 NOTE — Therapy (Signed)
[place], [place], [address] Phone: [phone]   Fax:  [phone]  Occupational Therapy Treatment  Patient Details  Name: Alexander Harrington MRN: [id number] Date of Birth: 11-Nov-1990 Referring Provider: Dr. Donnelly Angelica   Encounter Date: 08/15/2017  OT End of Session - 08/15/17 1429    Visit Number  5    Number of Visits  8    Date for OT Re-Evaluation  08/15/17    Authorization Type  [employer] PPO    OT Start Time  1045    OT Stop Time  1130    OT Time Calculation (min)  45 min    Activity Tolerance  Patient tolerated treatment well    Behavior During Therapy  [facility] for tasks assessed/performed       Past Medical History:  Diagnosis Date  . Anxiety    Dr. Toy Care  . Bipolar 2 disorder ([place])   . Depression     History reviewed. No pertinent surgical history.  There were no vitals filed for this visit.  Subjective Assessment - 08/15/17 1429    Currently in Pain?  No/denies         S: Fear of failure makes me not be accountable. O:  Group opened with members defining accountability.  Leader then explained definition of personal accountability, each individual being responsible for their own actions and holding themselves to this expected standard.  Personal accountability is doing what you know you should do, when you should do it.  Group discussed if accountability was a positive or negative theme for them and why they had this view point.  Group was educated on three type of accountability:  actions and choices, responsibilities, and goals.  Group provided examples of each accountability and identified the type that they most struggle with.  Leader identified the use of a daily prioritized to do list as a successful tool in improving accountability.  Group members discussed ways to prioritize the list.  Group then explored the theme that one must desire accountability in order to follow through  and be successful.  One's values must be considered when making accountability goals.  Each group member spent time reflecting on their work, relationship, Building surveyor, and leisure values.  They used a bull's eye to conceptualilze where they were at "today" in relation to their values.  Using this information, they will then work on creating microgoals that align with their values and will improve their accountability. A: Patient was engaged in OT group this date.  He feels he needs to focus on his personal accountabity and its alignment with his values.  P:  Continue skilled OT group 2 times per week for 3 weeks in order to improve coping and psychosocial skilled needed for improved community reintegration.                    OT Education - 08/15/17 1429    Education provided  Yes    Education Details  educated on steps to create a prioritized to do list    Person(s) Educated  Patient    Methods  Explanation    Comprehension  Verbalized understanding       OT Short Term Goals - 07/30/17 1550      OT SHORT TERM GOAL #1   Title  Patient will be educated on strategies to improve psychosocial skills needed to participate fully in all daily, work, and leisure activities.  Time  3    Period  Weeks    Status  On-going      OT SHORT TERM GOAL #2   Title  Patient will be educated on a HEP and independent with implementation of HEP.    Time  3    Period  Weeks    Status  On-going      OT SHORT TERM GOAL #3   Title  Patient will independently apply psychosocial skills and coping mechanisms to her daily activities in order to function independently.    Time  3    Period  Weeks    Status  On-going                 Patient will benefit from skilled therapeutic intervention in order to improve the following deficits and impairments:  Other (comment)(decreased coping skills, decrased psychosocial skills )  Visit Diagnosis: Bipolar I disorder, most recent  episode depressed ([place])  Difficulty coping  Personality change due to known physiological condition    Problem List Patient Active Problem List   Diagnosis Date Noted  . Bipolar I disorder, most recent episode depressed ([place]) 08/07/2017    Class: Chronic  . Hoarseness of voice 10/04/2015  . Tachycardia 08/30/2015  . Leukocytosis 02/08/2015  . Other specified hypothyroidism 02/08/2015  . MDD (major depressive disorder), recurrent episode, severe ([place]) 12/01/2013  . Substance induced mood disorder ([place]) 04/11/2012    Class: Acute  . Severe episode of recurrent major depressive disorder ([place]) 04/09/2012    Class: Acute  . Encounter for long-term (current) use of other medications 02/21/2011  . Routine general medical examination at a health care facility 02/21/2011  . Keratosis pilaris 02/21/2011    Vangie Bicker, [place], OTR/L [phone]  08/15/2017, 2:30 PM  [facility] PARTIAL HOSPITALIZATION PROGRAM [place], [place], [address] Phone: [phone]   Fax:  [phone]  Name: Brently Voorhis MRN: [id number] Date of Birth: Nov 29, 1990

## 2017-08-15 NOTE — Psych (Signed)
  [facility] Partial Hospitalization Program Psych Discharge Summary  Alexander Harrington [id number]  Admission date: 07/25/2017 Discharge date: 08/16/2017  Reason for admission: depression  Progress in Program Toward Treatment Goals: no great progress but he says he feels better.  Still anxious about leaving and starting a new job on Monday.  Says he is ready to go.  His basic personality remains the same   Progress (rationale): sticking to the program and regular attendance  Discharge Plan: Referral to Psychiatrist and Referral to Counselor/Psychotherapist    Donnelly Angelica, MD 08/15/2017

## 2017-08-16 ENCOUNTER — Other Ambulatory Visit (HOSPITAL_COMMUNITY): Payer: BC Managed Care – PPO | Admitting: Professional

## 2017-08-16 DIAGNOSIS — F3130 Bipolar disorder, current episode depressed, mild or moderate severity, unspecified: Secondary | ICD-10-CM

## 2017-08-16 DIAGNOSIS — F332 Major depressive disorder, recurrent severe without psychotic features: Secondary | ICD-10-CM | POA: Diagnosis not present

## 2017-08-16 NOTE — Psych (Signed)
   [facility] PHP THERAPIST PROGRESS NOTE  Alexander Harrington [id number]  Session Time: 9-1  Participation Level: Active  Behavioral Response: CasualAlertDepressed  Type of Therapy: Group Therapy, Psychotherapy, Activity Therapy, Psychoeducation  Treatment Goals addressed: Coping  Interventions: CBT, DBT, Supportive and Reframing  Summary: 9:00 - 10:30 Clinician led check-in regarding current stressors and situation, and review of patient completed daily inventory. Clinician utilized active listening and empathetic response and validated patient emotions. Clinician facilitated processing group on pertinent issues.  10:30 -11:30 Clinician introduced topic of "Mindfulness." Clinician showed "Don't try to be mindful" TedTalk. Group discussed the purpose of mindfulness, what it means that it is a skill, and the "What" and "How" skills.  11:30 - 12:50 Group continued with "Mindfulness" topic. Patients took part in mindfulness "workshop" in which they completed a number of activities to determine which form of practicing mindfulness is most effective for them.  12:50 - 1:00 Clinician led check-out. Clinician assessed for immediate needs, medication compliance and efficacy, and safety concerns.    Suicidal/Homicidal: Yeswithout intent/plan  Therapist Response: Kyreese Chio is a 28 y.o. male who presents with depression and anxiety symptoms, as well as SI and self-harm. Patient arrived within time allowed and reports that he is feeling "worried about leaving". Patient rates his mood at 4 on a scale of 1-10 with 10 being great. Patient reports that he had an okay afternoon/evening. Patient reported that he began to feel hopeless and worried about his future. Patient shared that he was able to use a distracting techniques and reframing. Pt shared with cln that he would like to step down to IOP instead of individual counseling due to concerns about continued progress and "spiraling back into a bad place with a  drop from 5 hours of counseling a day to 0. Cln was able to get pt schedule with IOP case manager for Tuesday, 1/29. Patient aligned with this plan. Patient engaged in activity and discussion. Patient demonstrates some progress as evidence by stating that he was going to practice implementing mindfulness techniques into his daily routine from now. Patient denies SI/HI/self-harm at the end of group.  Plan: Patient will discharge from [facility] due to meeting treatment goals of decreased depression and anxiety symptoms SI and self-harm, and increased distress tolerance skills and emotional regulation. Progress was measured by observation, self-report, and scales. Psychiatrist has approved discharge and patient reports alignment with discharge plan. Patient will step down to IOP within this agency. Patient is scheduled to start IOP on Tuesday, 1/29. Patient denies any SI/HI at time of discharge.   Diagnosis: Bipolar I disorder, most recent episode depressed ([place]) [F31.30]    1. Bipolar I disorder, most recent episode depressed ([place])     Royetta Crochet, LPCA 08/16/2017

## 2017-08-16 NOTE — Psych (Signed)
   [facility] PHP THERAPIST PROGRESS NOTE  Alexander Harrington [id number]  Session Time: 9-2  Participation Level: Active  Behavioral Response: CasualAlertDepressed  Type of Therapy: Group Therapy, Psychotherapy, Activity Therapy, Psychoeducation, OT  Treatment Goals addressed: Coping  Interventions: CBT, DBT, Supportive and Reframing  Summary: 9:00 -10:30: Clinician led check-in regarding current stressors and situation, and review of patient completed daily inventory. Clinician utilized active listening and empathetic response and validated patient emotions. Clinician facilitated processing group on pertinent issues.  10:30 - 11:30: OT Group  11:30 - 12:15: Clinician introduced topic of "Values and Goals". Group discussed how goals and values are related. Group discussed why values and having goals are important and why it is helpful to know what values/goals are most important to self. Group discussed how to find out what values are most important to self. Group discussed how values and goals can change over time with growth.  12:15 - 1:00: Reflection group: Patients encouraged to practice skills and interpersonal techniques or work on mindfulness and relaxation techniques. The importance of self-care and making skills part of a routine to increase usage were stressed.  1:00-1:50: Clinician continued with topic of "Values and Goals". Group completed a values worksheet to help narrow down most important values to self. Group discussed why certain values are more important to self than others. Group completed a goals worksheet to help narrow down steps to working towards a goal.  1:50 - 2:00 Clinician led check-out. Clinician assessed for immediate needs, medication compliance and efficacy, and safety concerns.    Suicidal/Homicidal: Yeswithout intent/plan  Therapist Response: Alexander Harrington is a 27 y.o. male who presents with depression and anxiety symptoms, as well as SI and self-harm. Patient  arrived within time allowed and reports that he is feeling "okay." Patient rates his mood at a 6.5 on a scale of 1-10 with 10 being great. Patient reports that he had an okay afternoon. Patient was able to go to the grocery store, cook dinner for his parents, start orientation for his job, and watched a movie with a friend. Patient reports his anxiety is starting to increase about discharge. Pt shares fear of failing and spiraling back into old patterns. Patient engaged in activity and discussion. Patient demonstrates some progress as evidenced by stating that he is going to work on his time management skills and become more productive when it comes to writing. Patient denies SI/HI/self-harm at the end of group.    Plan: Patient will continue in PHP and medication management while working on decreasing depression and anxiety symptoms, SI and self-harm, and increasing distress tolerance skills and emotional regulation.  Diagnosis: Bipolar I disorder, most recent episode depressed ([place]) [F31.30]    1. Bipolar I disorder, most recent episode depressed ([place])     Royetta Crochet, LPCA 08/16/2017

## 2017-08-16 NOTE — Psych (Signed)
   [facility] PHP THERAPIST PROGRESS NOTE  Alexander Harrington [id number]  Session Time: 9-2  Participation Level: Active  Behavioral Response: CasualAlertDepressed  Type of Therapy: Group Therapy, Psychotherapy, Activity Therapy, Spiritual Care, Relaxation Group  Treatment Goals addressed: Coping  Interventions: CBT, DBT, Supportive and Reframing  Summary: 9:00 - 10:30 Clinician led check-in regarding current stressors and situation, and review of patient completed daily inventory. Clinician utilized active listening and empathetic response and validated patient emotions. Clinician facilitated processing group on pertinent issues.  10:30 -12:00 Spiritual care group  12:00 - 12:45 Reflection group: Patients encouraged to practice skills and interpersonal techniques or work on mindfulness and relaxation techniques. The importance of self-care and making skills part of a routine to increase usage were stressed.  12:45 - 1:50 Relaxation group: Cln [name] led yoga group focused on retraining the body's response to stress.  1:50 - 2:00 Clinician led check-out. Clinician assessed for immediate needs, medication compliance and efficacy, and safety concerns.    Suicidal/Homicidal: Yeswithout intent/plan  Therapist Response: Alexander Harrington is a 27 y.o. male who presents with depression and anxiety symptoms, as well as SI and self-harm. Patient arrived within time allowed and reports that he is feeling "a little better than last night." Patient rates his mood at a 5 on a 1-10 scale with 10 being great. Pt reports he sat for 5 hours "just staring at the walls" which created feelings of wanting to hurt himself. Pt reports he reached out to friends for support. Pt reports he feels he is having ups and downs with wanting to isolate. Pt reports he is struggling to work on minimizing his success. Patient engaged in activity and discussion. Patient is some progress as evidenced by increased insight on what he  needs to work on and using coping skills to not harm self. Patient denies SI/HI/self-harm at the end of group.   Plan: Patient will continue in PHP and medication management while working on decreasing depression and anxiety symptoms, SI and self-harm, and increasing distress tolerance skills and emotional regulation.  Diagnosis: Bipolar 1 disorder, depressed ([place]) [F31.9]    1. Bipolar 1 disorder, depressed ([place])     Ryden Wainer J Siyah Mault, LPCA 08/16/2017

## 2017-09-09 ENCOUNTER — Telehealth (HOSPITAL_COMMUNITY): Payer: Self-pay | Admitting: *Deleted

## 2017-09-09 NOTE — Telephone Encounter (Signed)
Called for extended date of ECT authorization as 1 unit was needed for Jan 2019 calendar year. 1 unit was authorized for Jan 2nd,ECT with Josem Kaufmann #[id number].

## 2017-09-17 ENCOUNTER — Telehealth (HOSPITAL_COMMUNITY): Payer: Self-pay | Admitting: Professional

## 2017-11-22 ENCOUNTER — Telehealth: Payer: Self-pay

## 2017-11-26 ENCOUNTER — Other Ambulatory Visit: Payer: Self-pay | Admitting: Psychiatry

## 2017-11-27 ENCOUNTER — Inpatient Hospital Stay: Admission: RE | Admit: 2017-11-27 | Payer: Self-pay | Source: Ambulatory Visit

## 2018-06-29 NOTE — Progress Notes (Addendum)
Alexander Harrington at [facility] [place], [place], [place] [address] [phone]  Date:  07/02/2018   Name:  Alexander Harrington   DOB:  [birth year]   MRN:  [id number]  PCP:  Darreld Mclean, MD    Chief Complaint: Fatigue (no energy, feeling runned down, dizziness, weakness, hx of depression)   History of Present Illness:  Alexander Harrington is a 27 y.o. very pleasant male patient who presents with the following:  Concern of fatigue today Last seen by myself in June of 2017 He does seen psychiatry for his bipolar disorder on a regular basis and is being treated as per his medication list.  Today he notes that he is feeling fatigued and weak for 10 to 14 days now.  He slept a lot last weak No cold sx now- mild cold a month ago but resolved now His symptoms may have gotten a bit better this week  A lot of time these sx might go along with his depression, but this feels more like a physical issue this time No fever noted He did have a mild cough  He got a flu shot 2 days ago but he already felt bad prior  Some loose stools No vomiting He is eating ok but did not eat today so far  He may feel sleepy sometimes, sometimes just more tired  He feels better for a couple of hours in the am His Spravato dose was reduced recently, he is also on symbyax, lamictal and ambien He takes adderall and gabapentin prn He sees Dr. Toy Care   He feels like his bipolar disorder is under ok control, but not perfect.   No suicidal ideation right now   He does note that he is often thirsty and has a dry mouth.  However, he has attributed this to his medications.  No urinary frequency.  No rash No chest pain or shortness of breath.  BP Readings from Last 3 Encounters:  05/20/17 140/88  05/10/17 119/84  01/18/16 131/84   Pulse Readings from Last 3 Encounters:  05/20/17 90  05/10/17 82  01/18/16 98     Patient Active Problem List   Diagnosis Date Noted   . Bipolar I disorder, most recent episode depressed ([place]) 08/07/2017    Class: Chronic  . Hoarseness of voice 10/04/2015  . Tachycardia 08/30/2015  . Leukocytosis 02/08/2015  . Other specified hypothyroidism 02/08/2015  . MDD (major depressive disorder), recurrent episode, severe ([place]) 12/01/2013  . Substance induced mood disorder ([place]) 04/11/2012    Class: Acute  . Severe episode of recurrent major depressive disorder ([place]) 04/09/2012    Class: Acute  . Encounter for long-term (current) use of other medications 02/21/2011  . Routine general medical examination at a health care facility 02/21/2011  . Keratosis pilaris 02/21/2011    Past Medical History:  Diagnosis Date  . Anxiety    Dr. Toy Care  . Bipolar 2 disorder ([place])   . Depression     History reviewed. No pertinent surgical history.  Social History   Tobacco Use  . Smoking status: Never Smoker  . Smokeless tobacco: Never Used  Substance Use Topics  . Alcohol use: Yes    Alcohol/week: 5.0 - 6.0 standard drinks    Types: 5 - 6 Cans of beer per week    Comment: once a week or every other week  . Drug use: Yes    Frequency: 1.0 times per week  Comment: THC this summer, occasional use    Family History  Problem Relation Age of Onset  . Arthritis Mother   . Mental illness Mother   . Depression Mother   . Anxiety disorder Mother   . Drug abuse Maternal Grandfather   . Alcohol abuse Maternal Grandfather   . Drug abuse Maternal Grandmother   . Alcohol abuse Maternal Grandmother     Allergies  Allergen Reactions  . Amphetamines Other (See Comments)    Caused depression that went away after stopping the amphetamine  . Benzodiazepines Other (See Comments)    Got cloudy thinking, loss of memory, suicidal thinking on Klonopin.  . Codeine Rash    Medication list has been reviewed and updated.  Current Outpatient Medications on File Prior to Visit  Medication Sig Dispense Refill  .  amphetamine-dextroamphetamine (ADDERALL) 10 MG tablet Take 1 tablet (10 mg total) by mouth 2 (two) times daily with a meal. (Patient not taking: Reported on 07/02/2018) 30 tablet 0  . lamoTRIgine (LAMICTAL) 200 MG tablet Take 200 mg by mouth daily.  3  . OLANZapine-FLUoxetine (SYMBYAX) 3-25 MG capsule     . SPRAVATO, 84 MG DOSE, 28 MG/DEVICE SOPK     . zolpidem (AMBIEN) 10 MG tablet Take 10 mg by mouth at bedtime as needed for sleep.     No current facility-administered medications on file prior to visit.     Review of Systems:  As per HPI- otherwise negative.   Physical Examination: Vitals:   07/02/18 1621 07/02/18 1622  Resp:    Temp:    SpO2: 98% 98%   Vitals:   07/02/18 1549  Weight: 240 lb (108.9 kg)  Height: 5' 9.75" (1.772 m)   Body mass index is 34.68 kg/m. Ideal Body Weight: Weight in (lb) to have BMI = 25: 172.6  GEN: WDWN, NAD, Non-toxic, A & O x 3, obese, otherwise looks well HEENT: Atraumatic, Normocephalic. Neck supple. No masses, No LAD.  Bilateral TM wnl, oropharynx normal.  PEERL,EOMI.   Ears and Nose: No external deformity. CV: RRR, No M/G/R. No JVD. No thrill. No extra heart sounds. PULM: CTA B, no wheezes, crackles, rhonchi. No retractions. No resp. distress. No accessory muscle use. ABD: S, NT, ND. No rebound. No HSM. EXTR: No c/c/e NEURO Normal gait.  PSYCH: Normally interactive. Conversant. Not depressed or anxious appearing.  Calm demeanor.   EKG: NSR, rate of 92  Our lab closes at 4:15 on wedneday so I am not able to get labs for him today. He will come in tomorrow.   Results for orders placed or performed in visit on 07/02/18  POCT urinalysis dipstick  Result Value Ref Range   Color, UA yellow yellow   Clarity, UA clear clear   Glucose, UA negative negative mg/dL   Bilirubin, UA negative negative   Ketones, POC UA negative negative mg/dL   Spec Grav, UA 1.015 1.010 - 1.025   Blood, UA negative negative   pH, UA 6.0 5.0 - 8.0   Protein  Ur, POC negative negative mg/dL   Urobilinogen, UA 0.2 0.2 or 1.0 E.U./dL   Nitrite, UA Negative Negative   Leukocytes, UA Negative Negative    Assessment and Plan: Fatigue, unspecified type - Plan: EKG 12-Lead, CBC, Comprehensive metabolic panel, Ferritin, TSH, Vitamin D (25 hydroxy), CANCELED: CBC, CANCELED: Comprehensive metabolic panel, CANCELED: TSH, CANCELED: Ferritin, CANCELED: Vitamin D (25 hydroxy)  Dry mouth - Plan: POCT urinalysis dipstick, Hemoglobin A1c, CANCELED: Hemoglobin A1c  Young man  with history of mental illness, here today with complaint of feeling fatigued for about 2 weeks.  This may be  medication or mood related, but we want to make sure there is not a physical cause.  EKG is normal. However he does have a mild orthostatic response, with his pulse increasing from laying to standing Await his labs as above, will be in touch with his results ASAP.  He is asked to contact me if any worsening or significant change in the meantime.  Signed Lamar Blinks, MD   12/12- received his labs Low vitamin D  Otherwise all ok

## 2018-07-02 ENCOUNTER — Encounter: Payer: Self-pay | Admitting: Family Medicine

## 2018-07-02 ENCOUNTER — Ambulatory Visit: Payer: BC Managed Care – PPO | Admitting: Family Medicine

## 2018-07-02 VITALS — Temp 98.2°F | Resp 16 | Ht 69.75 in | Wt 240.0 lb

## 2018-07-02 DIAGNOSIS — R5383 Other fatigue: Secondary | ICD-10-CM

## 2018-07-02 DIAGNOSIS — R682 Dry mouth, unspecified: Secondary | ICD-10-CM | POA: Diagnosis not present

## 2018-07-02 LAB — POCT URINALYSIS DIP (MANUAL ENTRY)
Bilirubin, UA: NEGATIVE
Glucose, UA: NEGATIVE mg/dL
Ketones, POC UA: NEGATIVE mg/dL
Leukocytes, UA: NEGATIVE
NITRITE UA: NEGATIVE
Protein Ur, POC: NEGATIVE mg/dL
RBC UA: NEGATIVE
SPEC GRAV UA: 1.015 (ref 1.010–1.025)
UROBILINOGEN UA: 0.2 U/dL
pH, UA: 6 (ref 5.0–8.0)

## 2018-07-02 NOTE — Patient Instructions (Signed)
We are getting some lab work for you today.  We will try to determine any cause of your feeling tired.  In the meantime, please drink plenty of fluids.  It seems he may be a bit dehydrated.  If he have any questions, concerns, or if you are feeling worse please let me know.  Otherwise I will be in touch with your lab results ASAP

## 2018-07-03 ENCOUNTER — Other Ambulatory Visit (INDEPENDENT_AMBULATORY_CARE_PROVIDER_SITE_OTHER): Payer: BC Managed Care – PPO

## 2018-07-03 DIAGNOSIS — R682 Dry mouth, unspecified: Secondary | ICD-10-CM

## 2018-07-03 DIAGNOSIS — R5383 Other fatigue: Secondary | ICD-10-CM | POA: Diagnosis not present

## 2018-07-03 DIAGNOSIS — E559 Vitamin D deficiency, unspecified: Secondary | ICD-10-CM

## 2018-07-03 LAB — HEMOGLOBIN A1C: Hgb A1c MFr Bld: 5 % (ref 4.6–6.5)

## 2018-07-03 LAB — COMPREHENSIVE METABOLIC PANEL
ALT: 33 U/L (ref 0–53)
AST: 21 U/L (ref 0–37)
Albumin: 4.6 g/dL (ref 3.5–5.2)
Alkaline Phosphatase: 58 U/L (ref 39–117)
BUN: 14 mg/dL (ref 6–23)
CO2: 30 mEq/L (ref 19–32)
CREATININE: 0.93 mg/dL (ref 0.40–1.50)
Calcium: 10.2 mg/dL (ref 8.4–10.5)
Chloride: 103 mEq/L (ref 96–112)
GFR: 103.16 mL/min (ref >60.00)
Glucose, Bld: 97 mg/dL (ref 70–99)
Potassium: 5.2 mEq/L — ABNORMAL HIGH (ref 3.5–5.1)
Sodium: 140 mEq/L (ref 135–145)
Total Bilirubin: 0.6 mg/dL (ref 0.2–1.2)
Total Protein: 7.7 g/dL (ref 6.0–8.3)

## 2018-07-03 LAB — CBC
HCT: 49.2 % (ref 39.0–52.0)
Hemoglobin: 16.2 g/dL (ref 13.0–17.0)
MCHC: 32.9 g/dL (ref 30.0–36.0)
MCV: 84.8 fl (ref 78.0–100.0)
Platelets: 436 10*3/uL — ABNORMAL HIGH (ref 150.0–400.0)
RBC: 5.81 Mil/uL (ref 4.22–5.81)
RDW: 12.8 % (ref 11.5–15.5)
WBC: 11 10*3/uL — ABNORMAL HIGH (ref 4.0–10.5)

## 2018-07-03 LAB — VITAMIN D 25 HYDROXY (VIT D DEFICIENCY, FRACTURES): VITD: 18.11 ng/mL — ABNORMAL LOW (ref 30.00–100.00)

## 2018-07-03 LAB — TSH: TSH: 0.66 u[IU]/mL (ref 0.35–4.50)

## 2018-07-03 LAB — FERRITIN: Ferritin: 92.3 ng/mL (ref 22.0–322.0)

## 2018-07-04 MED ORDER — VITAMIN D3 1.25 MG (50000 UT) PO CAPS: ORAL_CAPSULE | ORAL | 0 refills | Status: DC

## 2018-07-04 NOTE — Progress Notes (Signed)
Received his labs, called pt and LMOM that his D is low, I will rx a weekly vitamin D supplement for him to take for 12 weeks. OW labs are ok, letter with details to pt   Results for orders placed or performed in visit on 07/03/18  Vitamin D (25 hydroxy)  Result Value Ref Range   VITD 18.11 (L) 30.00 - 100.00 ng/mL  TSH  Result Value Ref Range   TSH 0.66 0.35 - 4.50 uIU/mL  Hemoglobin A1c  Result Value Ref Range   Hgb A1c MFr Bld 5.0 4.6 - 6.5 %  Ferritin  Result Value Ref Range   Ferritin 92.3 22.0 - 322.0 ng/mL  Comprehensive metabolic panel  Result Value Ref Range   Sodium 140 135 - 145 mEq/L   Potassium 5.2 (H) 3.5 - 5.1 mEq/L   Chloride 103 96 - 112 mEq/L   CO2 30 19 - 32 mEq/L   Glucose, Bld 97 70 - 99 mg/dL   BUN 14 6 - 23 mg/dL   Creatinine, Ser 0.93 0.40 - 1.50 mg/dL   Total Bilirubin 0.6 0.2 - 1.2 mg/dL   Alkaline Phosphatase 58 39 - 117 U/L   AST 21 0 - 37 U/L   ALT 33 0 - 53 U/L   Total Protein 7.7 6.0 - 8.3 g/dL   Albumin 4.6 3.5 - 5.2 g/dL   Calcium 10.2 8.4 - 10.5 mg/dL   GFR 103.16 >60.00 mL/min  CBC  Result Value Ref Range   WBC 11.0 (H) 4.0 - 10.5 K/uL   RBC 5.81 4.22 - 5.81 Mil/uL   Platelets 436.0 (H) 150.0 - 400.0 K/uL   Hemoglobin 16.2 13.0 - 17.0 g/dL   HCT 49.2 39.0 - 52.0 %   MCV 84.8 78.0 - 100.0 fl   MCHC 32.9 30.0 - 36.0 g/dL   RDW 12.8 11.5 - 15.5 %

## 2018-07-04 NOTE — Addendum Note (Signed)
Addended by: Lamar Blinks C on: 07/04/2018 03:53 PM   Modules accepted: Orders

## 2018-09-22 ENCOUNTER — Ambulatory Visit: Payer: BC Managed Care – PPO | Admitting: Family Medicine

## 2018-09-22 VITALS — BP 110/68 | HR 96 | Temp 98.0°F | Resp 18 | Ht 69.75 in | Wt 252.0 lb

## 2018-09-22 DIAGNOSIS — E559 Vitamin D deficiency, unspecified: Secondary | ICD-10-CM

## 2018-09-22 DIAGNOSIS — F5105 Insomnia due to other mental disorder: Secondary | ICD-10-CM | POA: Diagnosis not present

## 2018-09-22 DIAGNOSIS — F99 Mental disorder, not otherwise specified: Secondary | ICD-10-CM

## 2018-09-22 DIAGNOSIS — Z114 Encounter for screening for human immunodeficiency virus [HIV]: Secondary | ICD-10-CM

## 2018-09-22 DIAGNOSIS — D72829 Elevated white blood cell count, unspecified: Secondary | ICD-10-CM

## 2018-09-22 NOTE — Patient Instructions (Addendum)
We will check your CBC and vitamin D today, HIV screening  I will call Dr. Starleen Arms office and let her know that you need your ambien refilled.   Please do call her office yourself as well- you need an appt and to have this med refilled.  However, as it is a controlled substance you need to always get it from the same doctor  I talked to her office and they would like you to call them asap [phone]

## 2018-09-22 NOTE — Progress Notes (Addendum)
[place] at [facility] [address] [place], [place] [address] [phone]  Date:  09/22/2018   Name:  Alexander Harrington   DOB:  1991/03/02   MRN:  [id number]  PCP:  Darreld Mclean, MD    Chief Complaint: Fatigue (follow up from december visit-blood work)   History of Present Illness:  Alexander Harrington is a 28 y.o. very pleasant male patient who presents with the following:  He was seen here for fatigue in December  Young man with history of mental illness, here today with complaint of feeling fatigued for about 2 weeks.  This may be  medication or mood related, but we want to make sure there is not a physical cause.  EKG is normal. However he does have a mild orthostatic response, with his pulse increasing from laying to standing Await his labs as above, will be in touch with his results ASAP.  He is asked to contact me if any worsening or significant change in the meantime.  His vitamin D was low, and I prescribed high dose D for him to take for 12 weeks  I also had wanted him to have a repeat CBC due to minimal abnl which has not been done yet  He had actually moved to [place] since our last visit, but decided to come back home   He notes that he is tired and feels like his body is tired  He asks me to refill his Lorrin Mais, but he gets this from Dr. Toy Care.  I asked him to contact her office about this   Patient Active Problem List   Diagnosis Date Noted  . Bipolar I disorder, most recent episode depressed ([place]) 08/07/2017    Class: Chronic  . Hoarseness of voice 10/04/2015  . Tachycardia 08/30/2015  . Leukocytosis 02/08/2015  . Other specified hypothyroidism 02/08/2015  . MDD (major depressive disorder), recurrent episode, severe ([place]) 12/01/2013  . Substance induced mood disorder ([place]) 04/11/2012    Class: Acute  . Severe episode of recurrent major depressive disorder ([place]) 04/09/2012    Class: Acute  . Encounter for  long-term (current) use of other medications 02/21/2011  . Routine general medical examination at a health care facility 02/21/2011  . Keratosis pilaris 02/21/2011    Past Medical History:  Diagnosis Date  . Anxiety    Dr. Toy Care  . Bipolar 2 disorder ([place])   . Depression     No past surgical history on file.  Social History   Tobacco Use  . Smoking status: Never Smoker  . Smokeless tobacco: Never Used  Substance Use Topics  . Alcohol use: Yes    Alcohol/week: 5.0 - 6.0 standard drinks    Types: 5 - 6 Cans of beer per week    Comment: once a week or every other week  . Drug use: Yes    Frequency: 1.0 times per week    Comment: THC this summer, occasional use    Family History  Problem Relation Age of Onset  . Arthritis Mother   . Mental illness Mother   . Depression Mother   . Anxiety disorder Mother   . Drug abuse Maternal Grandfather   . Alcohol abuse Maternal Grandfather   . Drug abuse Maternal Grandmother   . Alcohol abuse Maternal Grandmother     Allergies  Allergen Reactions  . Amphetamines Other (See Comments)    Caused depression that went away after stopping the amphetamine  .  Benzodiazepines Other (See Comments)    Got cloudy thinking, loss of memory, suicidal thinking on Klonopin.  . Codeine Rash    Medication list has been reviewed and updated.  Current Outpatient Medications on File Prior to Visit  Medication Sig Dispense Refill  . Cholecalciferol (VITAMIN D3) 1.25 MG (50000 UT) CAPS Take 1 weekly for 12 weeks 12 capsule 0  . lamoTRIgine (LAMICTAL) 200 MG tablet Take 200 mg by mouth daily.  3  . OLANZapine-FLUoxetine (SYMBYAX) 3-25 MG capsule     . SPRAVATO, 84 MG DOSE, 28 MG/DEVICE SOPK     . zolpidem (AMBIEN) 10 MG tablet Take 10 mg by mouth at bedtime as needed for sleep.     No current facility-administered medications on file prior to visit.     Review of Systems:  As per HPI- otherwise negative.   Physical Examination: Vitals:    09/22/18 1514 09/22/18 1527  BP: 110/68   Pulse: (!) 110 96  Resp: 18   Temp: 98 F (36.7 C)   SpO2: 98%    Vitals:   09/22/18 1514  Weight: 252 lb (114.3 kg)  Height: 5' 9.75" (1.772 m)   Body mass index is 36.42 kg/m. Ideal Body Weight: Weight in (lb) to have BMI = 25: 172.6  GEN: WDWN, NAD, Non-toxic, A & O x 3, obese, looks well  HEENT: Atraumatic, Normocephalic. Neck supple. No masses, No LAD. Ears and Nose: No external deformity. CV: RRR, No M/G/R. No JVD. No thrill. No extra heart sounds. PULM: CTA B, no wheezes, crackles, rhonchi. No retractions. No resp. distress. No accessory muscle use. EXTR: No c/c/e NEURO Normal gait.  PSYCH: Normally interactive. Conversant. Not depressed or anxious appearing.  Calm demeanor.   Pulse Readings from Last 3 Encounters:  09/22/18 96  05/20/17 90  05/10/17 82   BP Readings from Last 3 Encounters:  09/22/18 110/68  05/20/17 140/88  05/10/17 119/84    Assessment and Plan: Vitamin D deficiency - Plan: Vitamin D (25 hydroxy)  Leukocytosis, unspecified type - Plan: CBC  Screening for HIV (human immunodeficiency virus) - Plan: HIV Antibody (routine testing w rflx)  Insomnia due to other mental disorder  Here today with concern of needing to recheck his vitamin D and CBC  Also due for HIV screening  He asked me to refill his Ambien, however he has always gotten this from his psychiatrist.  I asked him to please follow-up with her about this controlled substance.  Patient reported that he had tried to get this refilled, but not been successful so far.  I called the psychiatry office for him today and left a message that he needed his Ambien refilled  Will plan further follow- up pending labs.   Signed Lamar Blinks, MD  Addendum 3/5.  Received his labs as below  Results for orders placed or performed in visit on 09/22/18  CBC  Result Value Ref Range   WBC 13.2 (H) 4.0 - 10.5 K/uL   RBC 5.20 4.22 - 5.81 Mil/uL    Platelets 390.0 150.0 - 400.0 K/uL   Hemoglobin 14.7 13.0 - 17.0 g/dL   HCT 43.4 39.0 - 52.0 %   MCV 83.4 78.0 - 100.0 fl   MCHC 33.8 30.0 - 36.0 g/dL   RDW 12.8 11.5 - 15.5 %  Vitamin D (25 hydroxy)  Result Value Ref Range   VITD 21.95 (L) 30.00 - 100.00 ng/mL  HIV Antibody (routine testing w rflx)  Result Value Ref Range  HIV 1&2 Ab, 4th Generation NON-REACTIVE NON-REACTI   His white blood cell count was elevated slightly in December, and is a bit higher now.  However still not high enough to be alarming.  Letter to patient, I would like to repeat a CBC and pathology smear in 1 month  We will also reorder high-dose vitamin D

## 2018-09-23 LAB — CBC
HCT: 43.4 % (ref 39.0–52.0)
HEMOGLOBIN: 14.7 g/dL (ref 13.0–17.0)
MCHC: 33.8 g/dL (ref 30.0–36.0)
MCV: 83.4 fl (ref 78.0–100.0)
PLATELETS: 390 10*3/uL (ref 150.0–400.0)
RBC: 5.2 Mil/uL (ref 4.22–5.81)
RDW: 12.8 % (ref 11.5–15.5)
WBC: 13.2 10*3/uL — ABNORMAL HIGH (ref 4.0–10.5)

## 2018-09-23 LAB — HIV ANTIBODY (ROUTINE TESTING W REFLEX): HIV 1&2 Ab, 4th Generation: NONREACTIVE

## 2018-09-23 LAB — VITAMIN D 25 HYDROXY (VIT D DEFICIENCY, FRACTURES): VITD: 21.95 ng/mL — ABNORMAL LOW (ref 30.00–100.00)

## 2018-09-25 ENCOUNTER — Encounter: Payer: Self-pay | Admitting: Family Medicine

## 2018-09-25 MED ORDER — VITAMIN D3 1.25 MG (50000 UT) PO CAPS: ORAL_CAPSULE | ORAL | 0 refills | Status: DC

## 2018-09-25 NOTE — Addendum Note (Signed)
Addended by: Lamar Blinks C on: 09/25/2018 09:24 PM   Modules accepted: Orders

## 2018-09-30 ENCOUNTER — Telehealth (HOSPITAL_COMMUNITY): Payer: Self-pay | Admitting: Professional

## 2018-10-03 ENCOUNTER — Encounter: Payer: Self-pay | Admitting: Internal Medicine

## 2018-10-03 ENCOUNTER — Ambulatory Visit: Payer: BC Managed Care – PPO | Admitting: Internal Medicine

## 2018-10-03 ENCOUNTER — Other Ambulatory Visit: Payer: Self-pay

## 2018-10-03 VITALS — BP 100/80 | HR 146 | Temp 98.0°F | Ht 69.75 in | Wt 253.0 lb

## 2018-10-03 DIAGNOSIS — R6889 Other general symptoms and signs: Secondary | ICD-10-CM

## 2018-10-03 LAB — POC INFLUENZA A&B (BINAX/QUICKVUE)
Influenza A, POC: NEGATIVE
Influenza B, POC: NEGATIVE

## 2018-10-03 MED ORDER — ONDANSETRON HCL 4 MG PO TABS: 4.0000 mg | ORAL_TABLET | Freq: Three times a day (TID) | ORAL | 0 refills | Status: DC | PRN

## 2018-10-03 MED ORDER — ALBUTEROL SULFATE HFA 108 (90 BASE) MCG/ACT IN AERS: 2.0000 | INHALATION_SPRAY | Freq: Four times a day (QID) | RESPIRATORY_TRACT | 0 refills | Status: DC | PRN

## 2018-10-03 NOTE — Progress Notes (Signed)
   Subjective:   Patient ID: Alexander Harrington, male    DOB: December 30, 1990, 28 y.o.   MRN: [id number]  HPI The patient is a 28 y.o. man coming in for cold symptoms. Started about 1 week ago but symptoms have really started in the last 2-3 days. He did recently fly back from [place] in the last 3 weeks. Main symptoms are: chills, body aches, nausea and 1 episode of vomiting last night. . Denies headaches but head is hurting when he coughs. Overall it is not improving. Has tried nyquil but nothing else. Some headaches as well.   Review of Systems  Constitutional: Positive for activity change, appetite change and chills. Negative for fatigue, fever and unexpected weight change.  HENT: Positive for congestion, postnasal drip, rhinorrhea and sinus pressure. Negative for ear discharge, ear pain, sinus pain, sneezing, sore throat, tinnitus, trouble swallowing and voice change.   Eyes: Negative.   Respiratory: Positive for cough and shortness of breath. Negative for chest tightness and wheezing.   Cardiovascular: Negative.   Gastrointestinal: Positive for nausea and vomiting.  Musculoskeletal: Positive for myalgias.  Neurological: Positive for headaches.    Objective:  Physical Exam Constitutional:      Appearance: He is well-developed.  HENT:     Head: Normocephalic and atraumatic.     Comments: Oropharynx with redness and clear drainage, nose with swollen turbinates, TMs normal bilaterally.  Neck:     Musculoskeletal: Normal range of motion.     Thyroid: No thyromegaly.  Cardiovascular:     Rate and Rhythm: Normal rate and regular rhythm.  Pulmonary:     Effort: Pulmonary effort is normal. No respiratory distress.     Breath sounds: Normal breath sounds. No wheezing or rales.  Abdominal:     Palpations: Abdomen is soft.  Musculoskeletal:        General: Tenderness present.  Lymphadenopathy:     Cervical: No cervical adenopathy.  Skin:    General: Skin is warm and dry.  Neurological:   Mental Status: He is alert and oriented to person, place, and time.     Vitals:   10/03/18 0840  BP: 100/80  Pulse: (!) 146  Temp: 98 F (36.7 C)  TempSrc: Oral  SpO2: 97%  Weight: 253 lb (114.8 kg)  Height: 5' 9.75" (1.772 m)    Assessment & Plan:

## 2018-10-03 NOTE — Assessment & Plan Note (Signed)
Flu testing done in office which is negative. Rx for albuterol and zofran to help with symptoms. If breathing worsens go to ER.

## 2018-10-03 NOTE — Patient Instructions (Signed)
We have sent in the albuterol inhaler to use if needed for breathing.  We have sent in nausea medicine to use if needed.   Start taking zyrtec at home.

## 2018-10-06 ENCOUNTER — Ambulatory Visit: Payer: Self-pay

## 2018-10-06 NOTE — Telephone Encounter (Signed)
Pt with cough x weeks and body aches called to be put on Z pack. Pt c/o subjective fever. Pt c/o getting SOB with exertion (climbing stairs or walking around). No SOB at rest. Pt c/o dry nonproductive cough, sniffles and fatigue. Pt has had no international travel. Pt is requesting a Z pack, stating he is "certain" he has bronchitis. Pt is not wanting an appointment. Pt advised that he may be called to make an appointment.  Reason for Disposition . Cough has been present for > 3 weeks  Answer Assessment - Initial Assessment Questions 1. ONSET: "When did the cough begin?"      3 weeks ago 2. SEVERITY: "How bad is the cough today?"      Dry nonproductive- coughing all night having coughing fits 3. RESPIRATORY DISTRESS: "Describe your breathing."      Feels like gets OOB easily when walking upstairs and winded 4. FEVER: "Do you have a fever?" If so, ask: "What is your temperature, how was it measured, and when did it start?"    No- body aches 5. HEMOPTYSIS: "Are you coughing up any blood?" If so ask: "How much?" (flecks, streaks, tablespoons, etc.)     no 6. TREATMENT: "What have you done so far to treat the cough?" (e.g., meds, fluids, humidifier)     Nyquil, inhaler,  7. CARDIAC HISTORY: "Do you have any history of heart disease?" (e.g., heart attack, congestive heart failure)      no 8. LUNG HISTORY: "Do you have any history of lung disease?"  (e.g., pulmonary embolus, asthma, emphysema)     no 9. PE RISK FACTORS: "Do you have a history of blood clots?" (or: recent major surgery, recent prolonged travel, bedridden)     no 10. OTHER SYMPTOMS: "Do you have any other symptoms? (e.g., runny nose, wheezing, chest pain)       Sniffles and sneezing,fatigue 11. PREGNANCY: "Is there any chance you are pregnant?" "When was your last menstrual period?"       n/a 12. TRAVEL: "Have you traveled out of the country in the last month?" (e.g., travel history, exposures)       No international  travel  Protocols used: [place]

## 2018-10-06 NOTE — Telephone Encounter (Signed)
Returned call to patient. Left VM to return call to office. 

## 2018-10-07 MED ORDER — DOXYCYCLINE HYCLATE 100 MG PO CAPS: 100.0000 mg | ORAL_CAPSULE | Freq: Two times a day (BID) | ORAL | 0 refills | Status: DC

## 2018-10-07 NOTE — Addendum Note (Signed)
Addended by: Lamar Blinks C on: 10/07/2018 07:52 PM   Modules accepted: Orders

## 2018-10-07 NOTE — Telephone Encounter (Signed)
Called him again and [facility]

## 2018-10-07 NOTE — Telephone Encounter (Signed)
Tried him again, no answer

## 2018-10-07 NOTE — Telephone Encounter (Signed)
Called pt back 3/17. No answer.  LMOM azithro can interact with his other meds, would not recommend if we need to use an abx (woud use doxy) Will try him back

## 2018-10-07 NOTE — Telephone Encounter (Signed)
Called him and did reach him No fever, he has a "deep cough" which is not generally productive  No recent travel or direct exposure to [place] like he has bronchitis Cough now for 2+ weeks Sent in rx for doxy Asked him to let me know if not better soon

## 2018-12-29 ENCOUNTER — Other Ambulatory Visit: Payer: Self-pay

## 2018-12-29 ENCOUNTER — Ambulatory Visit (INDEPENDENT_AMBULATORY_CARE_PROVIDER_SITE_OTHER): Payer: BC Managed Care – PPO | Admitting: Family Medicine

## 2018-12-29 ENCOUNTER — Encounter: Payer: Self-pay | Admitting: Family Medicine

## 2018-12-29 ENCOUNTER — Ambulatory Visit: Payer: BC Managed Care – PPO | Admitting: Medical

## 2018-12-29 DIAGNOSIS — J209 Acute bronchitis, unspecified: Secondary | ICD-10-CM

## 2018-12-29 MED ORDER — BENZONATATE 100 MG PO CAPS: 100.0000 mg | ORAL_CAPSULE | Freq: Three times a day (TID) | ORAL | 0 refills | Status: DC | PRN

## 2018-12-29 MED ORDER — AZITHROMYCIN 250 MG PO TABS: ORAL_TABLET | ORAL | 0 refills | Status: AC

## 2018-12-29 NOTE — Progress Notes (Signed)
Chief Complaint  Patient presents with  . Follow-up    Drainage    Joseph Berkshire here for URI complaints. Due to COVID-19 pandemic, we are interacting via web portal for an electronic face-to-face visit. I verified patient's ID using 2 identifiers. Patient agreed to proceed with visit via this method. Patient is at home, I am at office. Patient and I are present for visit.    Duration: 8 days  Associated symptoms: subjective fever, rhinorrhea, chest tightness and chest cough Denies: sinus congestion, sinus pain, itchy watery eyes, ear pain, ear drainage, sore throat, wheezing, shortness of breath and myalgia Treatment to date: Nyquil, SABA Sick contacts: No  ROS:  HEENT: As noted in HPI Lungs: No SOB  Past Medical History:  Diagnosis Date  . Anxiety    Dr. Toy Care  . Bipolar 2 disorder ([place])   . Depression    Exam No conversational dyspnea Age appropriate judgment and insight Nml affect and mood  Acute bronchitis, unspecified organism - Plan: azithromycin (ZITHROMAX) 250 MG tablet, benzonatate (TESSALON) 100 MG capsule  Orders as above. Cont supportive care, add cough medicine for dry cough. OK to use SABA prn. Abx if he does not cont to improve over next 3 d.  Continue to push fluids, practice good hand hygiene, cover mouth when coughing. F/u prn. If starting to experience fevers, shaking, or shortness of breath, seek immediate care. Pt voiced understanding and agreement to the plan.  [place], DO 12/29/18 2:41 PM

## 2019-01-12 ENCOUNTER — Telehealth: Payer: Self-pay | Admitting: Family Medicine

## 2019-01-12 DIAGNOSIS — J209 Acute bronchitis, unspecified: Secondary | ICD-10-CM

## 2019-01-12 MED ORDER — BENZONATATE 100 MG PO CAPS: 100.0000 mg | ORAL_CAPSULE | Freq: Three times a day (TID) | ORAL | 0 refills | Status: DC | PRN

## 2019-01-12 NOTE — Telephone Encounter (Signed)
Tessalon perles- ok

## 2019-01-13 NOTE — Progress Notes (Signed)
[place] at [facility] [address] [place], [name] [address] [phone]  Date:  01/14/2019   Name:  Alexander Harrington   DOB:  09/14/1990   MRN:  [id number]  PCP:  Darreld Mclean, MD    Chief Complaint: No chief complaint on file.   History of Present Illness:  Alexander Harrington is a 28 y.o. very pleasant male patient who presents with the following:  Virtual visit today to follow-up on cough Pt location is home, provider is at office Pt ID is confirmed with 2 identifiers, he gives consent for virtual visit today Patient was seen virtually by Dr. Nani Ravens on June 8 with symptoms of bronchitis.  He was treated with Zithromax and Tessalon Perles  Since his last visit he felt improved/ back to normal briefly, but then his symptoms seem to return Over the last 6-8 weeks he has noted recurrent symptoms of cough, itchy burning eyes, fatigue, dizzy Some sneezing, no ST  No fever higher than 99.9, generally normal  He would like to be tested for COVID as he is living with his parents -he is worried about getting them sick He had tried flonase in the past for his nasal allergies which did seem to help He is not using Flonase now He has tried some Benadryl  No rash, no vomiting  Patient Active Problem List   Diagnosis Date Noted  . Flu-like symptoms 10/03/2018  . Bipolar I disorder, most recent episode depressed ([place]) 08/07/2017    Class: Chronic  . Hoarseness of voice 10/04/2015  . Tachycardia 08/30/2015  . Leukocytosis 02/08/2015  . Other specified hypothyroidism 02/08/2015  . MDD (major depressive disorder), recurrent episode, severe ([place]) 12/01/2013  . Substance induced mood disorder ([place]) 04/11/2012    Class: Acute  . Severe episode of recurrent major depressive disorder ([place]) 04/09/2012    Class: Acute  . Encounter for long-term (current) use of other medications 02/21/2011  . Routine general medical examination at a  health care facility 02/21/2011  . Keratosis pilaris 02/21/2011    Past Medical History:  Diagnosis Date  . Anxiety    Dr. Toy Care  . Bipolar 2 disorder ([place])   . Depression     No past surgical history on file.  Social History   Tobacco Use  . Smoking status: Never Smoker  . Smokeless tobacco: Never Used  Substance Use Topics  . Alcohol use: Yes    Alcohol/week: 5.0 - 6.0 standard drinks    Types: 5 - 6 Cans of beer per week    Comment: once a week or every other week  . Drug use: Yes    Frequency: 1.0 times per week    Comment: THC this summer, occasional use    Family History  Problem Relation Age of Onset  . Arthritis Mother   . Mental illness Mother   . Depression Mother   . Anxiety disorder Mother   . Drug abuse Maternal Grandfather   . Alcohol abuse Maternal Grandfather   . Drug abuse Maternal Grandmother   . Alcohol abuse Maternal Grandmother     Allergies  Allergen Reactions  . Amphetamines Other (See Comments)    Caused depression that went away after stopping the amphetamine  . Benzodiazepines Other (See Comments)    Got cloudy thinking, loss of memory, suicidal thinking on Klonopin.  . Codeine Rash    Medication list has been reviewed and updated.  Current Outpatient Medications on  File Prior to Visit  Medication Sig Dispense Refill  . albuterol (PROVENTIL HFA;VENTOLIN HFA) 108 (90 Base) MCG/ACT inhaler Inhale 2 puffs into the lungs every 6 (six) hours as needed for wheezing or shortness of breath. 1 Inhaler 0  . benzonatate (TESSALON) 100 MG capsule Take 1 capsule (100 mg total) by mouth 3 (three) times daily as needed. 60 capsule 0  . Cholecalciferol (VITAMIN D3) 1.25 MG (50000 UT) CAPS Take 1 weekly for 12 weeks 12 capsule 0  . lamoTRIgine (LAMICTAL) 200 MG tablet Take 200 mg by mouth daily.  3  . OLANZapine-FLUoxetine (SYMBYAX) 3-25 MG capsule     . ondansetron (ZOFRAN) 4 MG tablet Take 1 tablet (4 mg total) by mouth every 8 (eight) hours as  needed for nausea or vomiting. 20 tablet 0  . SPRAVATO, 84 MG DOSE, 28 MG/DEVICE SOPK     . Vortioxetine HBr (TRINTELLIX PO) Take by mouth.    . zolpidem (AMBIEN) 10 MG tablet Take 10 mg by mouth at bedtime as needed for sleep.     No current facility-administered medications on file prior to visit.     Review of Systems:  As per HPI- otherwise negative.   Physical Examination: There were no vitals filed for this visit. There were no vitals filed for this visit. There is no height or weight on file to calculate BMI. Ideal Body Weight:    Pt observed over video -he looks well.  No distress, no cough, no wheezing No recent temp close to 100- it has been a couple of weeks since last occurrence He is not generally checking his BP   Assessment and Plan:   ICD-10-CM   1. Seasonal allergic rhinitis due to pollen  J30.1 fluticasone (FLONASE) 50 MCG/ACT nasal spray  2. Exposure to Covid-19 Virus  Z20.828    Here today with concern of recurrent/persistent cough, sneezing, itchy eyes.  He suspects allergies.  Refilled his Flonase, and suggested an over-the-counter antihistamine such as Claritin, Zyrtec, Allegra.  If these are not helpful he will let us know Avoid Singulair in this young man with history of mood disorder We will also refer him to the COVID 19 testing pool, as he wishes to be tested and has had possible symptoms  Follow-up: No follow-ups on file.  Meds ordered this encounter  Medications  . fluticasone (FLONASE) 50 MCG/ACT nasal spray    Sig: Place 2 sprays into both nostrils daily.    Dispense:  16 g    Refill:  6   No orders of the defined types were placed in this encounter.   @SIGN @  Outpatient Encounter Medications as of 01/14/2019  Medication Sig  . albuterol (PROVENTIL HFA;VENTOLIN HFA) 108 (90 Base) MCG/ACT inhaler Inhale 2 puffs into the lungs every 6 (six) hours as needed for wheezing or shortness of breath.  . benzonatate (TESSALON) 100 MG capsule Take  1 capsule (100 mg total) by mouth 3 (three) times daily as needed.  . Cholecalciferol (VITAMIN D3) 1.25 MG (50000 UT) CAPS Take 1 weekly for 12 weeks  . fluticasone (FLONASE) 50 MCG/ACT nasal spray Place 2 sprays into both nostrils daily.  Marland Kitchen lamoTRIgine (LAMICTAL) 200 MG tablet Take 200 mg by mouth daily.  Marland Kitchen OLANZapine-FLUoxetine (SYMBYAX) 3-25 MG capsule   . ondansetron (ZOFRAN) 4 MG tablet Take 1 tablet (4 mg total) by mouth every 8 (eight) hours as needed for nausea or vomiting.  Marland Kitchen SPRAVATO, 84 MG DOSE, 28 MG/DEVICE SOPK   . Vortioxetine HBr (  TRINTELLIX PO) Take by mouth.  . zolpidem (AMBIEN) 10 MG tablet Take 10 mg by mouth at bedtime as needed for sleep.   No facility-administered encounter medications on file as of 01/14/2019.      Signed Lamar Blinks, MD

## 2019-01-14 ENCOUNTER — Ambulatory Visit (INDEPENDENT_AMBULATORY_CARE_PROVIDER_SITE_OTHER): Payer: BC Managed Care – PPO | Admitting: Family Medicine

## 2019-01-14 ENCOUNTER — Other Ambulatory Visit: Payer: Self-pay

## 2019-01-14 ENCOUNTER — Encounter: Payer: Self-pay | Admitting: Family Medicine

## 2019-01-14 DIAGNOSIS — Z20828 Contact with and (suspected) exposure to other viral communicable diseases: Secondary | ICD-10-CM

## 2019-01-14 DIAGNOSIS — Z20822 Contact with and (suspected) exposure to covid-19: Secondary | ICD-10-CM

## 2019-01-14 DIAGNOSIS — J301 Allergic rhinitis due to pollen: Secondary | ICD-10-CM

## 2019-01-14 MED ORDER — FLUTICASONE PROPIONATE 50 MCG/ACT NA SUSP: 2.0000 | Freq: Every day | NASAL | 6 refills | Status: DC

## 2019-02-20 ENCOUNTER — Telehealth: Payer: Self-pay

## 2019-02-20 NOTE — Telephone Encounter (Signed)
Virtual Definitely! Thank you!

## 2019-02-20 NOTE — Telephone Encounter (Signed)
Copied from [place] [phone]. Topic: Appointment Scheduling - Scheduling Inquiry for Clinic >> Feb 18, 2019 12:33 PM Scherrie Gerlach wrote: Reason for CRM: reason for CRM: Dad calling to make appt for the pt for cough and fatigue. He also mentioned pt may need lab work done to find out what is going on.  Pt has had several appts over the last month for this same thing. Pls call dad >> Feb 18, 2019  2:48 PM Margot Ables wrote: Should be be virtual or in office?

## 2019-02-20 NOTE — Telephone Encounter (Signed)
Scheduled with pt dad

## 2019-02-22 NOTE — Progress Notes (Signed)
[name] at [facility] [address], [place], [place] [address] [phone]  Date:  02/23/2019   Name:  Alexander Harrington   DOB:  1991/05/19   MRN:  [id number]  PCP:  Darreld Mclean, MD    Chief Complaint: No chief complaint on file.   History of Present Illness:  Alexander Harrington is a 28 y.o. very pleasant male patient who presents with the following:  Pt with history of chronic significant mood disorder Virtual visit today due to concern of possible illness He notes that he has felt "sickish" over the last week or so; he had sx of feelig hot, "maybe just anxious", dizzy, flushed.  He had a temp to about 99.2, no cough or vomiting  He thinks his sx might have been due to "just stress" He is just checking his temp, no other vitals No vomiting or diarrhea  He now feels better as far as the sx above, but is still tired He notes difficulty sleeping, he often does not get to sleep until 4 or 5 am despite taking sleeping medication.  He has been taking ambien  His psychiatrist is Dr. Toy Care, he is speaking to her later today  He has never done a sleep study He does snore quite a bit per his report, and may wake up feeling like he was jolted awake  He would be interested in a sleep study  Lab Results  Component Value Date   TSH 0.66 07/03/2018     Patient Active Problem List   Diagnosis Date Noted  . Flu-like symptoms 10/03/2018  . Bipolar I disorder, most recent episode depressed ([place]) 08/07/2017    Class: Chronic  . Hoarseness of voice 10/04/2015  . Tachycardia 08/30/2015  . Leukocytosis 02/08/2015  . Other specified hypothyroidism 02/08/2015  . MDD (major depressive disorder), recurrent episode, severe ([name]) 12/01/2013  . Substance induced mood disorder ([name]) 04/11/2012    Class: Acute  . Severe episode of recurrent major depressive disorder ([name]) 04/09/2012    Class: Acute  . Encounter for long-term (current) use of other  medications 02/21/2011  . Routine general medical examination at a health care facility 02/21/2011  . Keratosis pilaris 02/21/2011    Past Medical History:  Diagnosis Date  . Anxiety    Dr. Toy Care  . Bipolar 2 disorder ([place])   . Depression     No past surgical history on file.  Social History   Tobacco Use  . Smoking status: Never Smoker  . Smokeless tobacco: Never Used  Substance Use Topics  . Alcohol use: Yes    Alcohol/week: 5.0 - 6.0 standard drinks    Types: 5 - 6 Cans of beer per week    Comment: once a week or every other week  . Drug use: Yes    Frequency: 1.0 times per week    Comment: THC this summer, occasional use    Family History  Problem Relation Age of Onset  . Arthritis Mother   . Mental illness Mother   . Depression Mother   . Anxiety disorder Mother   . Drug abuse Maternal Grandfather   . Alcohol abuse Maternal Grandfather   . Drug abuse Maternal Grandmother   . Alcohol abuse Maternal Grandmother     Allergies  Allergen Reactions  . Amphetamines Other (See Comments)    Caused depression that went away after stopping the amphetamine  . Benzodiazepines Other (See Comments)    Got  cloudy thinking, loss of memory, suicidal thinking on Klonopin.  . Codeine Rash    Medication list has been reviewed and updated.  Current Outpatient Medications on File Prior to Visit  Medication Sig Dispense Refill  . albuterol (PROVENTIL HFA;VENTOLIN HFA) 108 (90 Base) MCG/ACT inhaler Inhale 2 puffs into the lungs every 6 (six) hours as needed for wheezing or shortness of breath. 1 Inhaler 0  . benzonatate (TESSALON) 100 MG capsule Take 1 capsule (100 mg total) by mouth 3 (three) times daily as needed. 60 capsule 0  . Cholecalciferol (VITAMIN D3) 1.25 MG (50000 UT) CAPS Take 1 weekly for 12 weeks 12 capsule 0  . fluticasone (FLONASE) 50 MCG/ACT nasal spray Place 2 sprays into both nostrils daily. 16 g 6  . lamoTRIgine (LAMICTAL) 200 MG tablet Take 200 mg by mouth  daily.  3  . OLANZapine-FLUoxetine (SYMBYAX) 3-25 MG capsule     . ondansetron (ZOFRAN) 4 MG tablet Take 1 tablet (4 mg total) by mouth every 8 (eight) hours as needed for nausea or vomiting. 20 tablet 0  . SPRAVATO, 84 MG DOSE, 28 MG/DEVICE SOPK     . Vortioxetine HBr (TRINTELLIX PO) Take by mouth.    . zolpidem (AMBIEN) 10 MG tablet Take 10 mg by mouth at bedtime as needed for sleep.     No current facility-administered medications on file prior to visit.     Review of Systems:  As per HPI- otherwise negative. No current fever or chills  Physical Examination: There were no vitals filed for this visit. There were no vitals filed for this visit. There is no height or weight on file to calculate BMI. Ideal Body Weight:    Pt observed over video He looks well, obese,  He has an obese neck which may contribute to OSA No cough, SOB or wheezing noted   Assessment and Plan:   ICD-10-CM   1. Difficulty sleeping  G47.9 Ambulatory referral to Neurology   Virtual visit for illness, however these sx are now resolved However he does now have difficulty with sleep. Likely due in large part to mental illness, but he is also at risk for OSA and has snoring.  Will refer to neurology for a sleep eval He will let me know if any sx of illness return   Follow-up: No follow-ups on file.  No orders of the defined types were placed in this encounter.  Orders Placed This Encounter  Procedures  . Ambulatory referral to Neurology    @SIGN @    Signed Lamar Blinks, MD

## 2019-02-23 ENCOUNTER — Encounter: Payer: Self-pay | Admitting: Family Medicine

## 2019-02-23 ENCOUNTER — Other Ambulatory Visit: Payer: Self-pay

## 2019-02-23 ENCOUNTER — Ambulatory Visit (INDEPENDENT_AMBULATORY_CARE_PROVIDER_SITE_OTHER): Payer: BC Managed Care – PPO | Admitting: Family Medicine

## 2019-02-23 DIAGNOSIS — G479 Sleep disorder, unspecified: Secondary | ICD-10-CM

## 2019-03-17 ENCOUNTER — Encounter: Payer: Self-pay | Admitting: Neurology

## 2019-03-17 ENCOUNTER — Institutional Professional Consult (permissible substitution): Payer: Self-pay | Admitting: Neurology

## 2019-06-05 IMAGING — CT CT HEAD W/O CM
3 series · 16 of 47 positions shown, 19 images · non-contrast
Comparison: None.

CLINICAL DATA: Chronic headache

EXAM:
CT HEAD WITHOUT CONTRAST
TECHNIQUE: Contiguous axial images were obtained from the base of the skull
through the vertex without intravenous contrast.

[Series 2: head wo · axial · 0.46mm/px · z∈[-192,-47]mm · 10 of 35 slices shown, 13 images]
[im 3/35  brain]
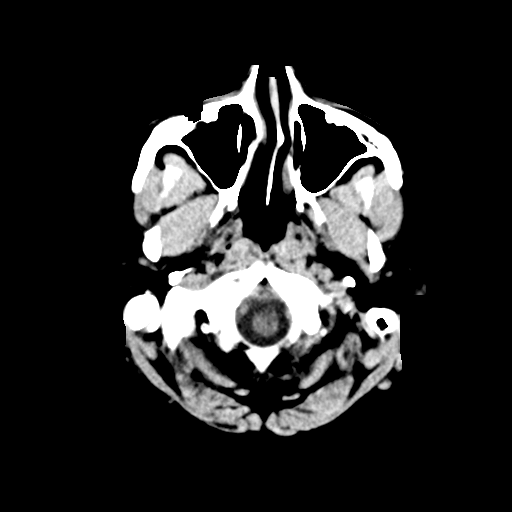
[im 3/35  bone]
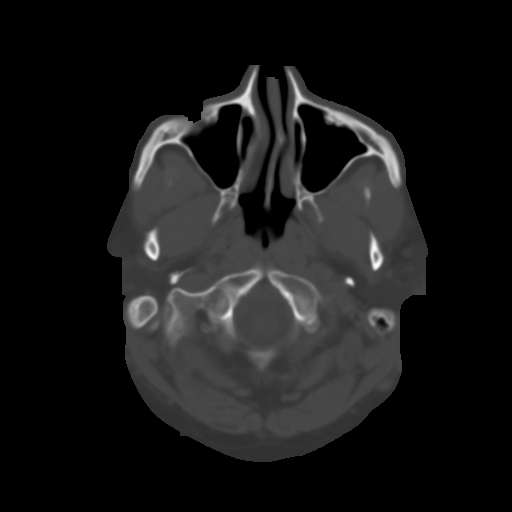
[im 6/35  brain]
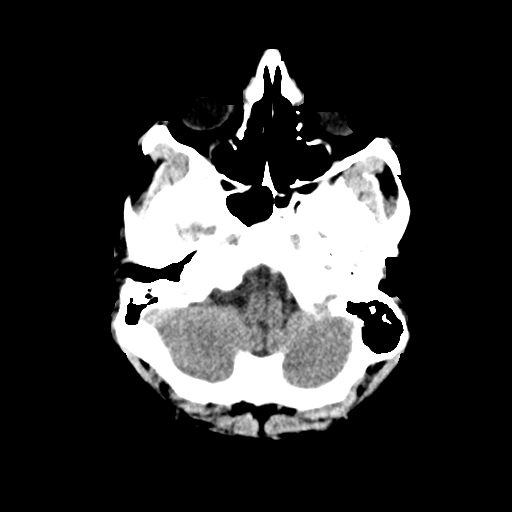
[im 10/35  brain]
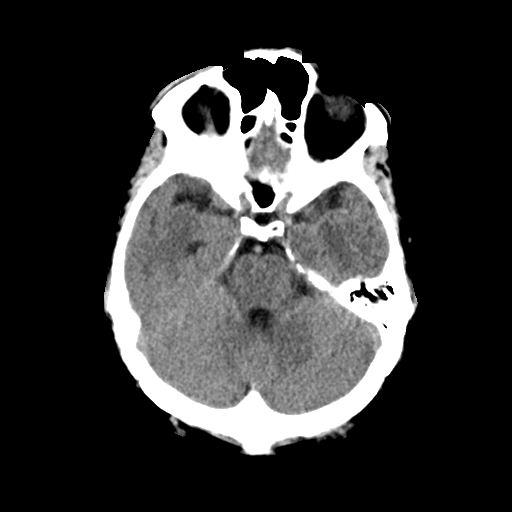
[im 12/35  brain]
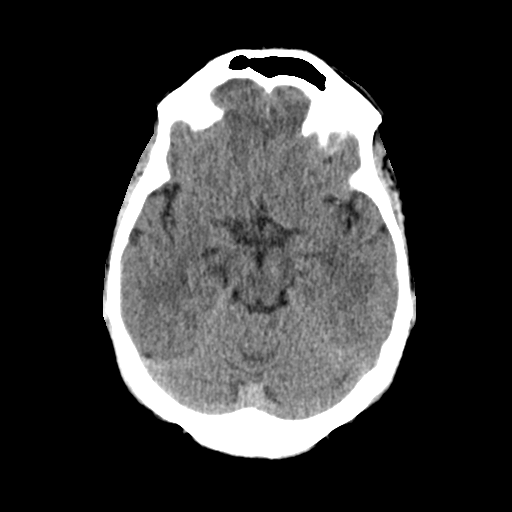
[im 16/35  brain]
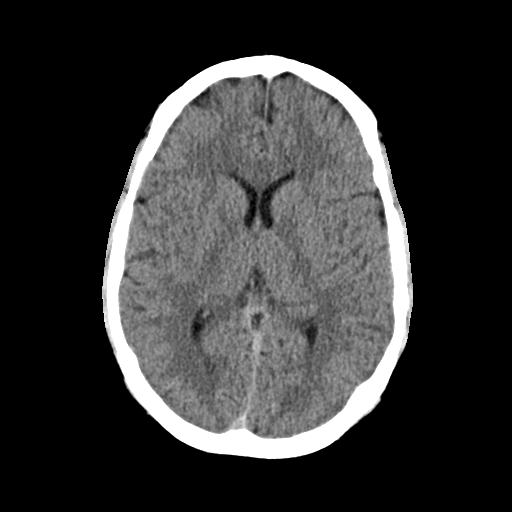
[im 16/35  bone]
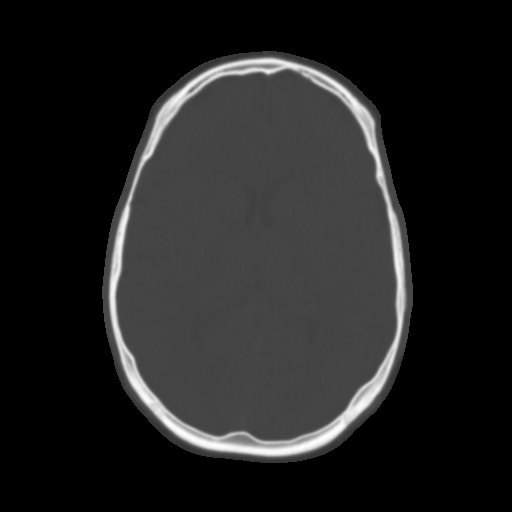
[im 19/35  brain]
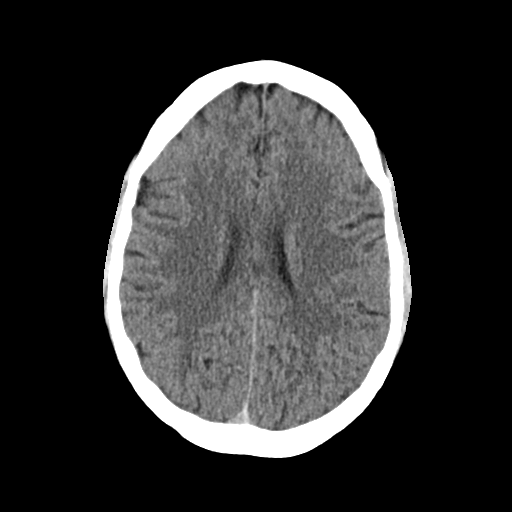
[im 23/35  brain]
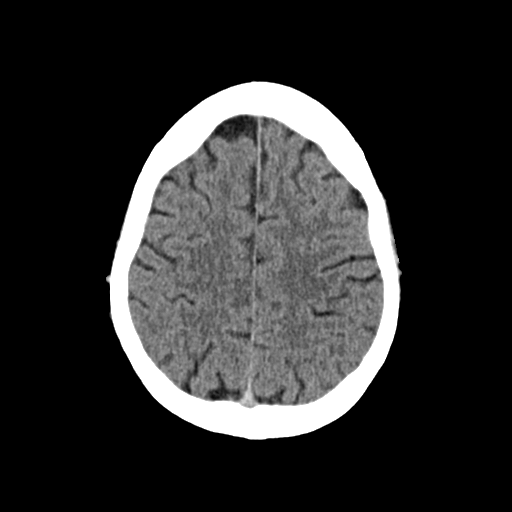
[im 26/35  brain]
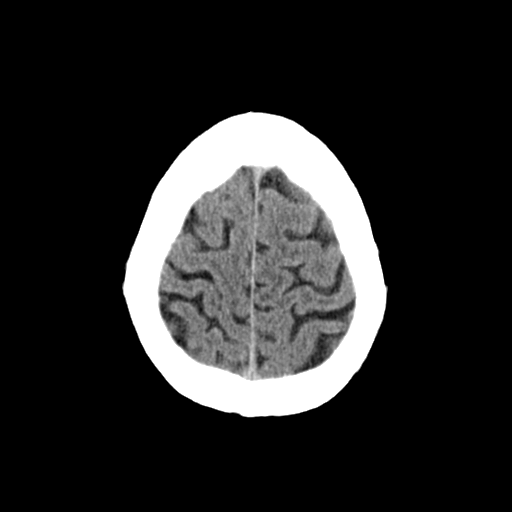
[im 29/35  brain]
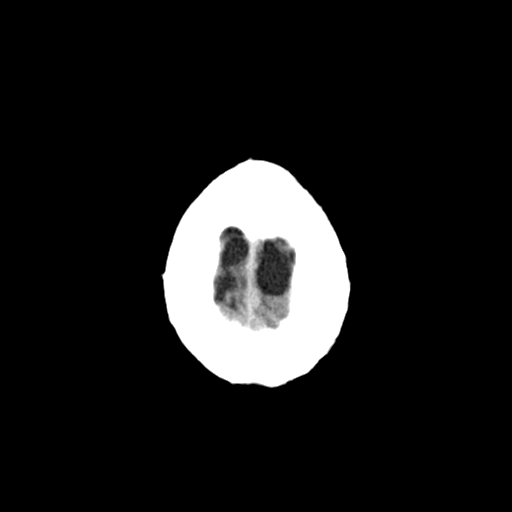
[im 29/35  bone]
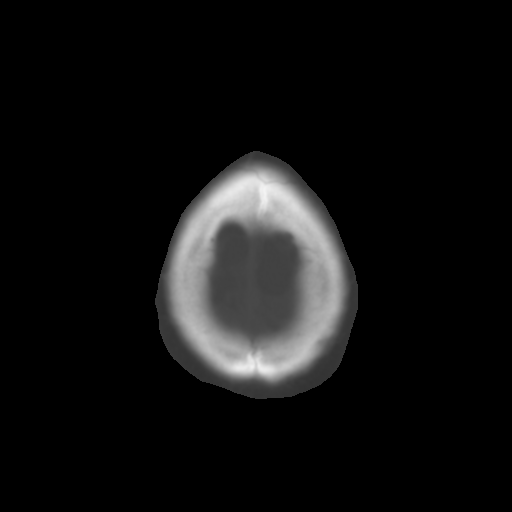
[im 32/35  brain]
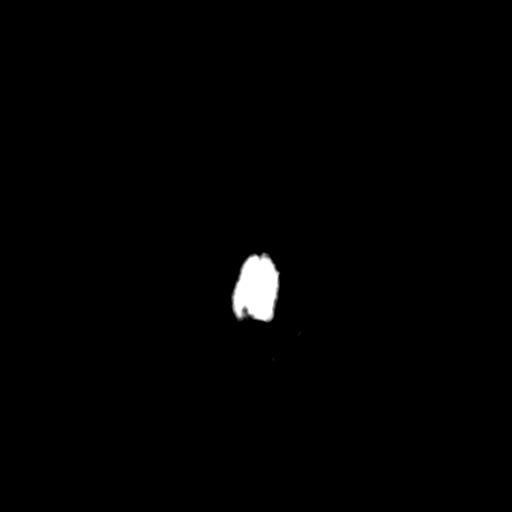

[Series 4: coronal soft · coronal · 0.34mm/px · 3 of 68 slices shown]
[im 23/68  brain]
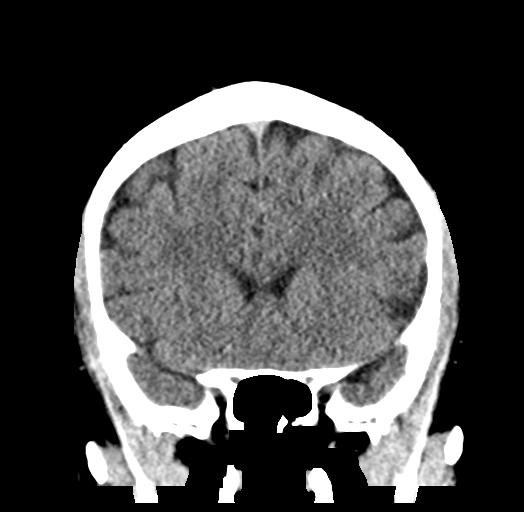
[im 30/68  brain]
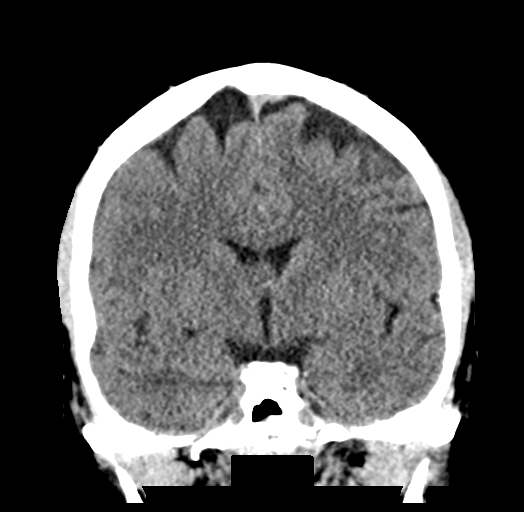
[im 38/68  brain]
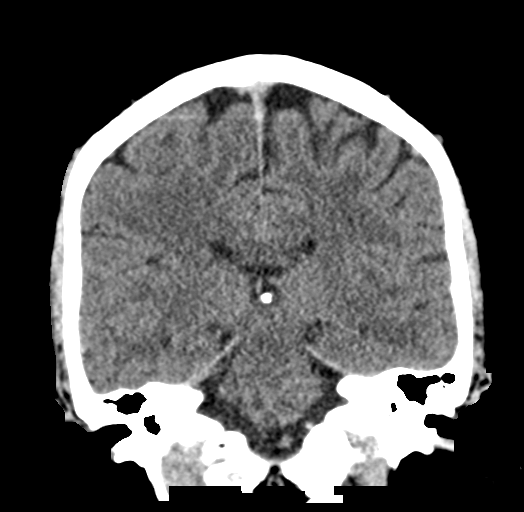

[Series 5: sag soft · sagittal · 0.34mm/px · 3 of 58 slices shown]
[im 20/58  brain]
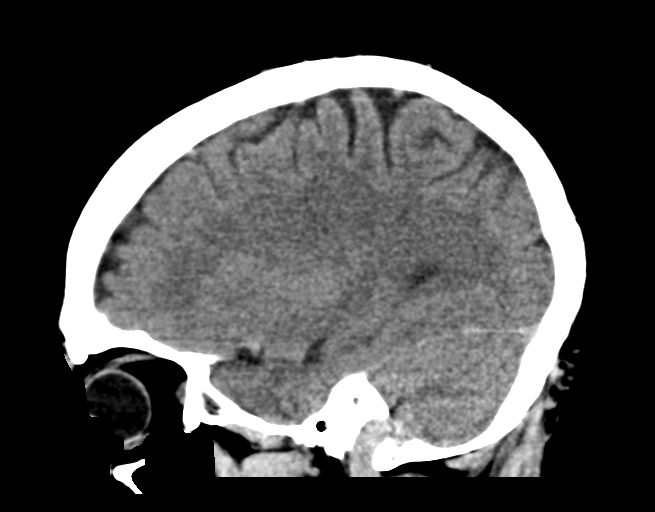
[im 29/58  brain]
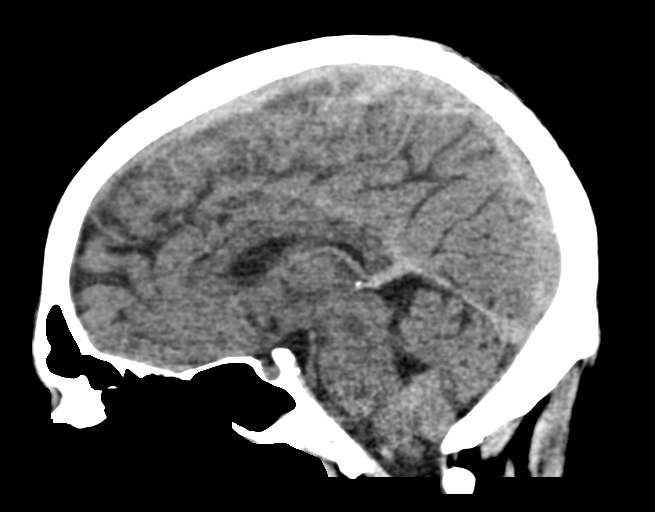
[im 39/58  brain]
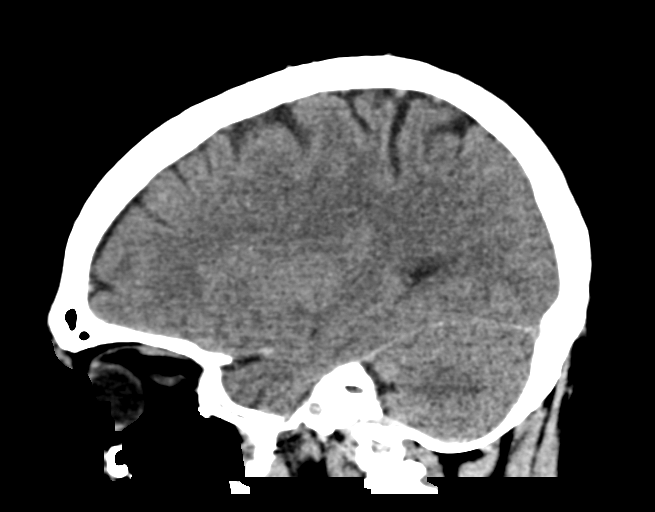

[16 of 47 positions shown; findings below may reference images not displayed]

FINDINGS: Brain: The ventricles are normal in size and configuration. There is
no intracranial mass, hemorrhage, extra-axial fluid collection, or
midline shift. Gray-white compartments are normal. No acute infarct
evident.

Vascular: No hyperdense vessel. No vascular calcification
appreciable.

Skull: Bony calvarium appears intact.

Sinuses/Orbits: There is mild mucosal thickening in several ethmoid
air cells. Other visualized paranasal sinuses are clear. Orbits
appear symmetric bilaterally.

Other: Mastoid air cells are clear.
IMPRESSION: Slight mucosal thickening in several ethmoid air cells. Study
otherwise unremarkable.

## 2019-07-01 ENCOUNTER — Other Ambulatory Visit: Payer: Self-pay

## 2019-07-01 DIAGNOSIS — Z20822 Contact with and (suspected) exposure to covid-19: Secondary | ICD-10-CM

## 2019-07-03 LAB — NOVEL CORONAVIRUS, NAA: SARS-CoV-2, NAA: NOT DETECTED

## 2019-07-21 ENCOUNTER — Other Ambulatory Visit: Payer: Self-pay

## 2019-07-21 ENCOUNTER — Ambulatory Visit: Payer: BC Managed Care – PPO | Admitting: Medical

## 2019-07-25 NOTE — Patient Instructions (Signed)

## 2019-07-25 NOTE — Progress Notes (Signed)
[place] at [facility] [address], [place], [place] [address] [phone]  Date:  07/27/2019   Name:  Alexander Harrington   DOB:  Mar 10, 1991   MRN:  [id number]  PCP:  Darreld Mclean, MD    Chief Complaint: No chief complaint on file.   History of Present Illness:  Alexander Harrington is a 29 y.o. very pleasant male patient who presents with the following:  Virtual visit today for a CPE-however changed to virtual visit so we will delay physical Pt location is at home, provider is at office Pt and myself are on the call today Pt ID confirmed with 2 factors, he gives consent for virtual visit today  History of depression/ bipolar disorder Last seen by myself in August due to feeling ill  His psychiatrist is Dr Toy Care Due for routine labs Flu shot: done in December at drugstore  He has noted symptoms of a cold and low grade temp- up to about 99.3; has noted these sx for a couple of weeks No cough He feels fine currently, no recent elevated temperatures.  He thought perhaps his higher temps were just due to anxiety  He had noted some moles/skin tags on his neck that bother him; we can remove for him at his convenience   Pt had been tested for covid 19 x3 so far- he was last tested about 2 weeks ago and was negative  Otherwise he feels well today  Patient Active Problem List   Diagnosis Date Noted  . Flu-like symptoms 10/03/2018  . Bipolar I disorder, most recent episode depressed ([place]) 08/07/2017    Class: Chronic  . Hoarseness of voice 10/04/2015  . Tachycardia 08/30/2015  . Leukocytosis 02/08/2015  . Other specified hypothyroidism 02/08/2015  . MDD (major depressive disorder), recurrent episode, severe ([place]) 12/01/2013  . Substance induced mood disorder ([place]) 04/11/2012    Class: Acute  . Severe episode of recurrent major depressive disorder ([place]) 04/09/2012    Class: Acute  . Encounter for long-term (current) use of other  medications 02/21/2011  . Routine general medical examination at a health care facility 02/21/2011  . Keratosis pilaris 02/21/2011    Past Medical History:  Diagnosis Date  . Anxiety    Dr. Toy Care  . Bipolar 2 disorder ([place])   . Depression     No past surgical history on file.  Social History   Tobacco Use  . Smoking status: Never Smoker  . Smokeless tobacco: Never Used  Substance Use Topics  . Alcohol use: Yes    Alcohol/week: 5.0 - 6.0 standard drinks    Types: 5 - 6 Cans of beer per week    Comment: once a week or every other week  . Drug use: Yes    Frequency: 1.0 times per week    Comment: THC this summer, occasional use    Family History  Problem Relation Age of Onset  . Arthritis Mother   . Mental illness Mother   . Depression Mother   . Anxiety disorder Mother   . Drug abuse Maternal Grandfather   . Alcohol abuse Maternal Grandfather   . Drug abuse Maternal Grandmother   . Alcohol abuse Maternal Grandmother     Allergies  Allergen Reactions  . Amphetamines Other (See Comments)    Caused depression that went away after stopping the amphetamine  . Benzodiazepines Other (See Comments)    Got cloudy thinking, loss of memory, suicidal thinking on  Klonopin.  . Codeine Rash    Medication list has been reviewed and updated.  Current Outpatient Medications on File Prior to Visit  Medication Sig Dispense Refill  . albuterol (PROVENTIL HFA;VENTOLIN HFA) 108 (90 Base) MCG/ACT inhaler Inhale 2 puffs into the lungs every 6 (six) hours as needed for wheezing or shortness of breath. 1 Inhaler 0  . benzonatate (TESSALON) 100 MG capsule Take 1 capsule (100 mg total) by mouth 3 (three) times daily as needed. 60 capsule 0  . Cholecalciferol (VITAMIN D3) 1.25 MG (50000 UT) CAPS Take 1 weekly for 12 weeks 12 capsule 0  . fluticasone (FLONASE) 50 MCG/ACT nasal spray Place 2 sprays into both nostrils daily. 16 g 6  . lamoTRIgine (LAMICTAL) 200 MG tablet Take 200 mg by mouth  daily.  3  . OLANZapine-FLUoxetine (SYMBYAX) 3-25 MG capsule     . ondansetron (ZOFRAN) 4 MG tablet Take 1 tablet (4 mg total) by mouth every 8 (eight) hours as needed for nausea or vomiting. 20 tablet 0  . SPRAVATO, 84 MG DOSE, 28 MG/DEVICE SOPK     . Vortioxetine HBr (TRINTELLIX PO) Take by mouth.    . zolpidem (AMBIEN) 10 MG tablet Take 10 mg by mouth at bedtime as needed for sleep.     No current facility-administered medications on file prior to visit.    Review of Systems:  As per HPI- otherwise negative.   Physical Examination: There were no vitals filed for this visit. There were no vitals filed for this visit. There is no height or weight on file to calculate BMI. Ideal Body Weight:    Pt observed via video  He looks well, his normal self.  Obese, no wheezing or distress is noted Assessment and Plan: Physical exam  Skin tag  Virtual visit today, patient was asked to visit virtually due to symptoms of possible low-grade fever.  He has noted "cold" symptoms for about 2 weeks, no cough.  He was last tested for Covid 2 weeks ago, negative result I feel it will be reasonable to bring him in the office a week from today, to the last 7 more days for resolution of a possible Covid infection.  Of course, he will let us know if symptoms are worsening or change in the meantime, or if he has any recurrent fever  Appointment for a week from today to have labs and skin tag removal  Signed Lamar Blinks, MD

## 2019-07-27 ENCOUNTER — Ambulatory Visit: Payer: BC Managed Care – PPO | Admitting: Family Medicine

## 2019-07-27 ENCOUNTER — Other Ambulatory Visit: Payer: Self-pay

## 2019-07-27 ENCOUNTER — Telehealth (INDEPENDENT_AMBULATORY_CARE_PROVIDER_SITE_OTHER): Payer: BC Managed Care – PPO | Admitting: Family Medicine

## 2019-07-27 DIAGNOSIS — Z0000 Encounter for general adult medical examination without abnormal findings: Secondary | ICD-10-CM

## 2019-07-27 DIAGNOSIS — L918 Other hypertrophic disorders of the skin: Secondary | ICD-10-CM

## 2019-08-01 NOTE — Progress Notes (Addendum)
[place] at [facility] [place], [place], [place] [address] [phone]  Date:  08/03/2019   Name:  Alexander Harrington   DOB:  1990-12-11   MRN:  [id number]  PCP:  Darreld Mclean, MD    Chief Complaint: Annual Exam (check skin tags)   History of Present Illness:  Alexander Harrington is a 29 y.o. very pleasant male patient who presents with the following:  Patient with history of significant mood disorder, hypothyroidism Here today with concern of skin tags that he would like removed-mostly around his neck He is a few bothersome skin tags that get in the way when shaving.  There is also one hyperpigmented likely mole in the midline of the anterior neck-this has been present for several years-he is concerned that this could possibly be skin cancer as it is pigmented.  I agree that sending the anterior mole for pathology is reasonable  Lab Results  Component Value Date   TSH 0.66 07/03/2018   He would also like to check some labs today, including vitamin D.  He has felt fatigued, but otherwise feels generally okay Psychiatrist is Dr. Toy Care Patient Active Problem List   Diagnosis Date Noted  . Bipolar I disorder, most recent episode depressed ([place]) 08/07/2017    Class: Chronic  . Hoarseness of voice 10/04/2015  . Tachycardia 08/30/2015  . Leukocytosis 02/08/2015  . Other specified hypothyroidism 02/08/2015  . MDD (major depressive disorder), recurrent episode, severe ([place]) 12/01/2013  . Substance induced mood disorder ([place]) 04/11/2012    Class: Acute  . Severe episode of recurrent major depressive disorder ([place]) 04/09/2012    Class: Acute  . Keratosis pilaris 02/21/2011    Past Medical History:  Diagnosis Date  . Anxiety    Dr. Toy Care  . Bipolar 2 disorder ([place])   . Depression     No past surgical history on file.  Social History   Tobacco Use  . Smoking status: Never Smoker  . Smokeless tobacco: Never Used  Substance  Use Topics  . Alcohol use: Yes    Alcohol/week: 5.0 - 6.0 standard drinks    Types: 5 - 6 Cans of beer per week    Comment: once a week or every other week  . Drug use: Yes    Frequency: 1.0 times per week    Comment: THC this summer, occasional use    Family History  Problem Relation Age of Onset  . Arthritis Mother   . Mental illness Mother   . Depression Mother   . Anxiety disorder Mother   . Drug abuse Maternal Grandfather   . Alcohol abuse Maternal Grandfather   . Drug abuse Maternal Grandmother   . Alcohol abuse Maternal Grandmother     Allergies  Allergen Reactions  . Amphetamines Other (See Comments)    Caused depression that went away after stopping the amphetamine  . Benzodiazepines Other (See Comments)    Got cloudy thinking, loss of memory, suicidal thinking on Klonopin.  . Codeine Rash    Medication list has been reviewed and updated.  Current Outpatient Medications on File Prior to Visit  Medication Sig Dispense Refill  . albuterol (PROVENTIL HFA;VENTOLIN HFA) 108 (90 Base) MCG/ACT inhaler Inhale 2 puffs into the lungs every 6 (six) hours as needed for wheezing or shortness of breath. 1 Inhaler 0  . benzonatate (TESSALON) 100 MG capsule Take 1 capsule (100 mg total) by mouth 3 (three) times daily as  needed. 60 capsule 0  . Cholecalciferol (VITAMIN D3) 1.25 MG (50000 UT) CAPS Take 1 weekly for 12 weeks 12 capsule 0  . fluticasone (FLONASE) 50 MCG/ACT nasal spray Place 2 sprays into both nostrils daily. 16 g 6  . lamoTRIgine (LAMICTAL) 200 MG tablet Take 200 mg by mouth daily.  3  . OLANZapine-FLUoxetine (SYMBYAX) 3-25 MG capsule     . ondansetron (ZOFRAN) 4 MG tablet Take 1 tablet (4 mg total) by mouth every 8 (eight) hours as needed for nausea or vomiting. 20 tablet 0  . SPRAVATO, 84 MG DOSE, 28 MG/DEVICE SOPK     . Vortioxetine HBr (TRINTELLIX PO) Take by mouth.    . zolpidem (AMBIEN) 10 MG tablet Take 10 mg by mouth at bedtime as needed for sleep.     No  current facility-administered medications on file prior to visit.    Review of Systems:  As per HPI- otherwise negative. Pulse Readings from Last 3 Encounters:  08/03/19 (!) 110  10/03/18 (!) 146  09/22/18 96     Physical Examination: Vitals:   08/03/19 1107  BP: 140/82  Pulse: (!) 110  Temp: 97.8 F (36.6 C)  SpO2: 96%   Vitals:   08/03/19 1107  Weight: 245 lb (111.1 kg)  Height: 5\' 10"  (1.778 m)   Body mass index is 35.15 kg/m. Ideal Body Weight: Weight in (lb) to have BMI = 25: 173.9  GEN: WDWN, NAD, Non-toxic, A & O x 3, obese, looks well HEENT: Atraumatic, Normocephalic. Neck supple. No masses, No LAD. Ears and Nose: No external deformity. CV: RRR, No M/G/R. No JVD. No thrill. No extra heart sounds. PULM: CTA B, no wheezes, crackles, rhonchi. No retractions. No resp. distress. No accessory muscle use. ABD: S, NT, ND, +BS. No rebound. No HSM. EXTR: No c/c/e NEURO Normal gait.  PSYCH: Normally interactive. Conversant. Not depressed or anxious appearing.  Calm demeanor.  There are 3 skin tags on the right side of the anterior neck.  There is an approximately 4 mm diameter pigmented mole on the anterior midline neck.  He is a few other melanocytic nevi scattered over posterior neck and shoulders, none appear cancerous  Verbal consent obtained.  3 skin tags and one nevus on the anterior neck prepped with Betadine and alcohol swabs.  Used small wheal of 2% lidocaine to anesthetize the largest skin tag and the mole.  3 skin tags removed with scissors, pigmented nevus removed with derma blade.  Hemostasis accomplished as needed with silver nitrate sticks.  Placed Band-Aid as needed.  Estimated blood loss 1 ml, patient tolerated procedure well with no immediate complications The pigmented mole is sent to pathology Assessment and Plan: Acquired skin tag - Plan: Ambulatory referral to Dermatology  Nevus - Plan: Dermatology pathology, Ambulatory referral to  Dermatology  Vitamin D deficiency - Plan: Vitamin D (25 hydroxy)  Leukocytosis, unspecified type - Plan: CBC, Comprehensive metabolic panel  Other specified hypothyroidism - Plan: TSH  Here today to follow-up on skin tags and a mole on his neck Removed skin tags and mole as above Labs pending- Will plan further follow- up pending labs. Will refer to dermatology for complete skin check This patient tends to be anxious and has mild tachycardia  Discussed wound care with patient.  If any sign of infection such as swelling, redness, heat, pus he is to let me know.  Otherwise he can remove Band-Aids when he gets home, replace Band-Aid as needed until healed This visit occurred  during the SARS-CoV-2 public health emergency.  Safety protocols were in place, including screening questions prior to the visit, additional usage of staff PPE, and extensive cleaning of exam room while observing appropriate contact time as indicated for disinfecting solutions.    Signed Lamar Blinks, MD  Received his labs 1/13 as below  Results for orders placed or performed in visit on 08/03/19  TSH  Result Value Ref Range   TSH 1.00 0.35 - 4.50 uIU/mL  CBC  Result Value Ref Range   WBC 12.0 (H) 4.0 - 10.5 K/uL   RBC 5.66 4.22 - 5.81 Mil/uL   Platelets 463.0 (H) 150.0 - 400.0 K/uL   Hemoglobin 15.7 13.0 - 17.0 g/dL   HCT 47.6 39.0 - 52.0 %   MCV 84.1 78.0 - 100.0 fl   MCHC 33.0 30.0 - 36.0 g/dL   RDW 12.6 11.5 - 15.5 %  Comprehensive metabolic panel  Result Value Ref Range   Sodium 138 135 - 145 mEq/L   Potassium 4.7 3.5 - 5.1 mEq/L   Chloride 101 96 - 112 mEq/L   CO2 28 19 - 32 mEq/L   Glucose, Bld 87 70 - 99 mg/dL   BUN 12 6 - 23 mg/dL   Creatinine, Ser 0.96 0.40 - 1.50 mg/dL   Total Bilirubin 0.5 0.2 - 1.2 mg/dL   Alkaline Phosphatase 67 39 - 117 U/L   AST 33 0 - 37 U/L   ALT 64 (H) 0 - 53 U/L   Total Protein 7.7 6.0 - 8.3 g/dL   Albumin 4.6 3.5 - 5.2 g/dL   GFR 92.84 >60.00 mL/min    Calcium 10.1 8.4 - 10.5 mg/dL  Vitamin D (25 hydroxy)  Result Value Ref Range   VITD 27.24 (L) 30.00 - 100.00 ng/mL

## 2019-08-01 NOTE — Patient Instructions (Addendum)
It was nice to see you again today, take care I will be in touch with your pathology report and your other labs asap We will set you up to see dermatology

## 2019-08-03 ENCOUNTER — Ambulatory Visit: Payer: BC Managed Care – PPO | Admitting: Family Medicine

## 2019-08-03 ENCOUNTER — Other Ambulatory Visit: Payer: Self-pay

## 2019-08-03 ENCOUNTER — Encounter: Payer: Self-pay | Admitting: Family Medicine

## 2019-08-03 VITALS — BP 140/82 | HR 110 | Temp 97.8°F | Ht 70.0 in | Wt 245.0 lb

## 2019-08-03 DIAGNOSIS — D224 Melanocytic nevi of scalp and neck: Secondary | ICD-10-CM | POA: Diagnosis not present

## 2019-08-03 DIAGNOSIS — D72829 Elevated white blood cell count, unspecified: Secondary | ICD-10-CM

## 2019-08-03 DIAGNOSIS — E559 Vitamin D deficiency, unspecified: Secondary | ICD-10-CM

## 2019-08-03 DIAGNOSIS — L918 Other hypertrophic disorders of the skin: Secondary | ICD-10-CM | POA: Diagnosis not present

## 2019-08-03 DIAGNOSIS — E038 Other specified hypothyroidism: Secondary | ICD-10-CM | POA: Diagnosis not present

## 2019-08-03 DIAGNOSIS — D229 Melanocytic nevi, unspecified: Secondary | ICD-10-CM

## 2019-08-03 LAB — CBC
HCT: 47.6 % (ref 39.0–52.0)
Hemoglobin: 15.7 g/dL (ref 13.0–17.0)
MCHC: 33 g/dL (ref 30.0–36.0)
MCV: 84.1 fl (ref 78.0–100.0)
Platelets: 463 10*3/uL — ABNORMAL HIGH (ref 150.0–400.0)
RBC: 5.66 Mil/uL (ref 4.22–5.81)
RDW: 12.6 % (ref 11.5–15.5)
WBC: 12 10*3/uL — ABNORMAL HIGH (ref 4.0–10.5)

## 2019-08-03 LAB — TSH: TSH: 1 u[IU]/mL (ref 0.35–4.50)

## 2019-08-03 LAB — COMPREHENSIVE METABOLIC PANEL
ALT: 64 U/L — ABNORMAL HIGH (ref 0–53)
AST: 33 U/L (ref 0–37)
Albumin: 4.6 g/dL (ref 3.5–5.2)
Alkaline Phosphatase: 67 U/L (ref 39–117)
BUN: 12 mg/dL (ref 6–23)
CO2: 28 mEq/L (ref 19–32)
Calcium: 10.1 mg/dL (ref 8.4–10.5)
Chloride: 101 mEq/L (ref 96–112)
Creatinine, Ser: 0.96 mg/dL (ref 0.40–1.50)
GFR: 92.84 mL/min (ref >60.00)
Glucose, Bld: 87 mg/dL (ref 70–99)
Potassium: 4.7 mEq/L (ref 3.5–5.1)
Sodium: 138 mEq/L (ref 135–145)
Total Bilirubin: 0.5 mg/dL (ref 0.2–1.2)
Total Protein: 7.7 g/dL (ref 6.0–8.3)

## 2019-08-03 LAB — VITAMIN D 25 HYDROXY (VIT D DEFICIENCY, FRACTURES): VITD: 27.24 ng/mL — ABNORMAL LOW (ref 30.00–100.00)

## 2019-08-04 ENCOUNTER — Encounter: Payer: Self-pay | Admitting: Family Medicine

## 2019-08-05 ENCOUNTER — Encounter: Payer: Self-pay | Admitting: Family Medicine

## 2019-08-12 ENCOUNTER — Other Ambulatory Visit: Payer: Self-pay

## 2019-08-12 ENCOUNTER — Encounter: Payer: Self-pay | Admitting: Family Medicine

## 2019-08-12 NOTE — Progress Notes (Addendum)
[place] at [facility], [address], [place] [address] [phone]  Date:  08/13/2019   Name:  Alexander Harrington   DOB:  February 12, 1991   MRN:  [id number]  PCP:  Darreld Mclean, MD    Chief Complaint: Annual Exam   History of Present Illness:  Alexander Harrington is a 29 y.o. very pleasant male patient who presents with the following:  Generally healthy young man with history of mood disorder, here today for a physical History of mood disorder and significant anxiety I saw him virtually on January 4, then he came in for skin lesion removal on January 11; these spots are healing well He sees Dr Toy Care for psychiatric care  He had a complete metabolic panel, CBC, vitamin D, TSH on January 11  He has had mild but persistent leukocytosis, recent elevation of platelets and high ALT- will follow-up these issues today  Admits he is not getting much exercise- it has been cold outdoors so he is not as active   Due for tetanus booster this spring- will give today   Wt Readings from Last 3 Encounters:  08/13/19 248 lb (112.5 kg)  08/03/19 245 lb (111.1 kg)  10/03/18 253 lb (114.8 kg)    Patient Active Problem List   Diagnosis Date Noted  . Bipolar I disorder, most recent episode depressed ([place]) 08/07/2017    Class: Chronic  . Hoarseness of voice 10/04/2015  . Tachycardia 08/30/2015  . Leukocytosis 02/08/2015  . Other specified hypothyroidism 02/08/2015  . MDD (major depressive disorder), recurrent episode, severe ([place]) 12/01/2013  . Substance induced mood disorder ([place]) 04/11/2012    Class: Acute  . Severe episode of recurrent major depressive disorder ([place]) 04/09/2012    Class: Acute  . Keratosis pilaris 02/21/2011    Past Medical History:  Diagnosis Date  . Anxiety    Dr. Toy Care  . Bipolar 2 disorder ([place])   . Depression     No past surgical history on file.  Social History   Tobacco Use  . Smoking status: Never  Smoker  . Smokeless tobacco: Never Used  Substance Use Topics  . Alcohol use: Yes    Alcohol/week: 5.0 - 6.0 standard drinks    Types: 5 - 6 Cans of beer per week    Comment: once a week or every other week  . Drug use: Yes    Frequency: 1.0 times per week    Comment: THC this summer, occasional use    Family History  Problem Relation Age of Onset  . Arthritis Mother   . Mental illness Mother   . Depression Mother   . Anxiety disorder Mother   . Drug abuse Maternal Grandfather   . Alcohol abuse Maternal Grandfather   . Drug abuse Maternal Grandmother   . Alcohol abuse Maternal Grandmother     Allergies  Allergen Reactions  . Amphetamines Other (See Comments)    Caused depression that went away after stopping the amphetamine  . Benzodiazepines Other (See Comments)    Got cloudy thinking, loss of memory, suicidal thinking on Klonopin.  . Codeine Rash    Medication list has been reviewed and updated.  Current Outpatient Medications on File Prior to Visit  Medication Sig Dispense Refill  . Cholecalciferol (VITAMIN D) 50 MCG (2000 UT) tablet Take 2,000 Units by mouth daily.    Marland Kitchen lamoTRIgine (LAMICTAL) 200 MG tablet Take 200 mg by mouth daily.  3  .  lithium carbonate (ESKALITH) 450 MG CR tablet Take 450 mg by mouth at bedtime.    Marland Kitchen OLANZapine-FLUoxetine (SYMBYAX) 3-25 MG capsule     . TRINTELLIX 10 MG TABS tablet Take 10 mg by mouth daily.    . Vortioxetine HBr (TRINTELLIX PO) Take 20 mg by mouth.     . zolpidem (AMBIEN) 10 MG tablet Take 10 mg by mouth at bedtime as needed for sleep.    . fluticasone (FLONASE) 50 MCG/ACT nasal spray Place 2 sprays into both nostrils daily. (Patient not taking: Reported on 08/13/2019) 16 g 6   No current facility-administered medications on file prior to visit.    Review of Systems:  As per HPI- otherwise negative. No fever or chills   Physical Examination: Vitals:   08/13/19 0939  BP: 122/86  Pulse: 99  Resp: 18  Temp: (!) 97.3  F (36.3 C)  SpO2: 99%   Vitals:   08/13/19 0939  Weight: 248 lb (112.5 kg)  Height: 5\' 10"  (1.778 m)   Body mass index is 35.58 kg/m. Ideal Body Weight: Weight in (lb) to have BMI = 25: 173.9  GEN: WDWN, NAD, Non-toxic, A & O x 3, obese, ow looks well Skin lesion removal sites are healing normally HEENT: Atraumatic, Normocephalic. Neck supple. No masses, No LAD. Ears and Nose: No external deformity. CV: RRR, No M/G/R. No JVD. No thrill. No extra heart sounds. PULM: CTA B, no wheezes, crackles, rhonchi. No retractions. No resp. distress. No accessory muscle use. ABD: S, NT, ND, +BS. No rebound. No HSM. EXTR: No c/c/e NEURO Normal gait.  PSYCH: Normally interactive. Conversant. Not depressed or anxious appearing.  Calm demeanor.    Assessment and Plan: Physical exam  Leukocytosis, unspecified type - Plan: CBC (INCLUDES DIFF/PLT) WITH PATHOLOGIST REVIEW, CANCELED: CBC with Differential/Platelet, CANCELED: Pathologist smear review  Elevated LFTs - Plan: Hepatic function panel  Screening for hyperlipidemia - Plan: Lipid panel  Immunization due - Plan: Td vaccine greater than or equal to 7yo preservative free IM  Obesity (BMI 30-39.9) here today for a routine CPE Labs pending as above Boosted tetanus  Encouraged exercise for weight loss and stress reduction  Moderate med decision making today  This visit occurred during the SARS-CoV-2 public health emergency.  Safety protocols were in place, including screening questions prior to the visit, additional usage of staff PPE, and extensive cleaning of exam room while observing appropriate contact time as indicated for disinfecting solutions.     Signed Lamar Blinks, MD  Received his labs as below, message for patient  Results for orders placed or performed in visit on 08/13/19  Lipid panel  Result Value Ref Range   Cholesterol 226 (H) 0 - 200 mg/dL   Triglycerides 312.0 (H) 0.0 - 149.0 mg/dL   HDL 35.70 (L) >39.00  mg/dL   VLDL 62.4 (H) 0.0 - 40.0 mg/dL   Total CHOL/HDL Ratio 6    NonHDL 190.71   Hepatic function panel  Result Value Ref Range   Total Bilirubin 0.7 0.2 - 1.2 mg/dL   Bilirubin, Direct 0.1 0.0 - 0.3 mg/dL   Alkaline Phosphatase 67 39 - 117 U/L   AST 28 0 - 37 U/L   ALT 49 0 - 53 U/L   Total Protein 7.7 6.0 - 8.3 g/dL   Albumin 4.7 3.5 - 5.2 g/dL  LDL cholesterol, direct  Result Value Ref Range   Direct LDL 145.0 mg/dL

## 2019-08-13 ENCOUNTER — Other Ambulatory Visit: Payer: Self-pay

## 2019-08-13 ENCOUNTER — Encounter: Payer: Self-pay | Admitting: Family Medicine

## 2019-08-13 ENCOUNTER — Ambulatory Visit (INDEPENDENT_AMBULATORY_CARE_PROVIDER_SITE_OTHER): Payer: BC Managed Care – PPO | Admitting: Family Medicine

## 2019-08-13 VITALS — BP 122/86 | HR 99 | Temp 97.3°F | Resp 18 | Ht 70.0 in | Wt 248.0 lb

## 2019-08-13 DIAGNOSIS — R7989 Other specified abnormal findings of blood chemistry: Secondary | ICD-10-CM

## 2019-08-13 DIAGNOSIS — D72829 Elevated white blood cell count, unspecified: Secondary | ICD-10-CM

## 2019-08-13 DIAGNOSIS — Z13220 Encounter for screening for lipoid disorders: Secondary | ICD-10-CM | POA: Diagnosis not present

## 2019-08-13 DIAGNOSIS — E669 Obesity, unspecified: Secondary | ICD-10-CM

## 2019-08-13 DIAGNOSIS — Z23 Encounter for immunization: Secondary | ICD-10-CM

## 2019-08-13 DIAGNOSIS — Z0000 Encounter for general adult medical examination without abnormal findings: Secondary | ICD-10-CM | POA: Diagnosis not present

## 2019-08-13 LAB — HEPATIC FUNCTION PANEL
ALT: 49 U/L (ref 0–53)
AST: 28 U/L (ref 0–37)
Albumin: 4.7 g/dL (ref 3.5–5.2)
Alkaline Phosphatase: 67 U/L (ref 39–117)
Bilirubin, Direct: 0.1 mg/dL (ref 0.0–0.3)
Total Bilirubin: 0.7 mg/dL (ref 0.2–1.2)
Total Protein: 7.7 g/dL (ref 6.0–8.3)

## 2019-08-13 LAB — LIPID PANEL
Cholesterol: 226 mg/dL — ABNORMAL HIGH (ref 0–200)
HDL: 35.7 mg/dL — ABNORMAL LOW (ref >39.00)
NonHDL: 190.71
Total CHOL/HDL Ratio: 6
Triglycerides: 312 mg/dL — ABNORMAL HIGH (ref 0.0–149.0)
VLDL: 62.4 mg/dL — ABNORMAL HIGH (ref 0.0–40.0)

## 2019-08-13 LAB — LDL CHOLESTEROL, DIRECT: Direct LDL: 145 mg/dL

## 2019-08-13 NOTE — Patient Instructions (Signed)
Good to see you again today, I will be in touch with your labs You got your tetanus booster today Exercise will be beneficial for your physical health and your mood/ anxiety.  Bundle up and get outside!     Health Maintenance, Male Adopting a healthy lifestyle and getting preventive care are important in promoting health and wellness. Ask your health care provider about:  The right schedule for you to have regular tests and exams.  Things you can do on your own to prevent diseases and keep yourself healthy. What should I know about diet, weight, and exercise? Eat a healthy diet   Eat a diet that includes plenty of vegetables, fruits, low-fat dairy products, and lean protein.  Do not eat a lot of foods that are high in solid fats, added sugars, or sodium. Maintain a healthy weight Body mass index (BMI) is a measurement that can be used to identify possible weight problems. It estimates body fat based on height and weight. Your health care provider can help determine your BMI and help you achieve or maintain a healthy weight. Get regular exercise Get regular exercise. This is one of the most important things you can do for your health. Most adults should:  Exercise for at least 150 minutes each week. The exercise should increase your heart rate and make you sweat (moderate-intensity exercise).  Do strengthening exercises at least twice a week. This is in addition to the moderate-intensity exercise.  Spend less time sitting. Even light physical activity can be beneficial. Watch cholesterol and blood lipids Have your blood tested for lipids and cholesterol at 29 years of age, then have this test every 5 years. You may need to have your cholesterol levels checked more often if:  Your lipid or cholesterol levels are high.  You are older than 29 years of age.  You are at high risk for heart disease. What should I know about cancer screening? Many types of cancers can be detected early  and may often be prevented. Depending on your health history and family history, you may need to have cancer screening at various ages. This may include screening for:  Colorectal cancer.  Prostate cancer.  Skin cancer.  Lung cancer. What should I know about heart disease, diabetes, and high blood pressure? Blood pressure and heart disease  High blood pressure causes heart disease and increases the risk of stroke. This is more likely to develop in people who have high blood pressure readings, are of African descent, or are overweight.  Talk with your health care provider about your target blood pressure readings.  Have your blood pressure checked: ? Every 3-5 years if you are 94-55 years of age. ? Every year if you are 99 years old or older.  If you are between the ages of 55 and 24 and are a current or former smoker, ask your health care provider if you should have a one-time screening for abdominal aortic aneurysm (AAA). Diabetes Have regular diabetes screenings. This checks your fasting blood sugar level. Have the screening done:  Once every three years after age 74 if you are at a normal weight and have a low risk for diabetes.  More often and at a younger age if you are overweight or have a high risk for diabetes. What should I know about preventing infection? Hepatitis B If you have a higher risk for hepatitis B, you should be screened for this virus. Talk with your health care provider to find out if  you are at risk for hepatitis B infection. Hepatitis C Blood testing is recommended for:  Everyone born from [birth year] through 1965.  Anyone with known risk factors for hepatitis C. Sexually transmitted infections (STIs)  You should be screened each year for STIs, including gonorrhea and chlamydia, if: ? You are sexually active and are younger than 29 years of age. ? You are older than 29 years of age and your health care provider tells you that you are at risk for this type of  infection. ? Your sexual activity has changed since you were last screened, and you are at increased risk for chlamydia or gonorrhea. Ask your health care provider if you are at risk.  Ask your health care provider about whether you are at high risk for HIV. Your health care provider may recommend a prescription medicine to help prevent HIV infection. If you choose to take medicine to prevent HIV, you should first get tested for HIV. You should then be tested every 3 months for as long as you are taking the medicine. Follow these instructions at home: Lifestyle  Do not use any products that contain nicotine or tobacco, such as cigarettes, e-cigarettes, and chewing tobacco. If you need help quitting, ask your health care provider.  Do not use street drugs.  Do not share needles.  Ask your health care provider for help if you need support or information about quitting drugs. Alcohol use  Do not drink alcohol if your health care provider tells you not to drink.  If you drink alcohol: ? Limit how much you have to 0-2 drinks a day. ? Be aware of how much alcohol is in your drink. In the [place] one drink equals one 12 oz bottle of beer (355 mL), one 5 oz glass of wine (148 mL), or one 1 oz glass of hard liquor (44 mL). General instructions  Schedule regular health, dental, and eye exams.  Stay current with your vaccines.  Tell your health care provider if: ? You often feel depressed. ? You have ever been abused or do not feel safe at home. Summary  Adopting a healthy lifestyle and getting preventive care are important in promoting health and wellness.  Follow your health care provider's instructions about healthy diet, exercising, and getting tested or screened for diseases.  Follow your health care provider's instructions on monitoring your cholesterol and blood pressure. This information is not intended to replace advice given to you by your health care provider. Make sure you  discuss any questions you have with your health care provider. Document Revised: 07/02/2018 Document Reviewed: 07/02/2018 Elsevier Patient Education  2020 [employer].

## 2019-08-14 ENCOUNTER — Encounter: Payer: Self-pay | Admitting: Family Medicine

## 2019-08-14 LAB — CBC (INCLUDES DIFF/PLT) WITH PATHOLOGIST REVIEW
Absolute Monocytes: 855 cells/uL (ref 200–950)
Basophils Absolute: 57 cells/uL (ref 0–200)
Basophils Relative: 0.5 %
Eosinophils Absolute: 205 cells/uL (ref 15–500)
Eosinophils Relative: 1.8 %
HCT: 47.3 % (ref 38.5–50.0)
Hemoglobin: 15.9 g/dL (ref 13.2–17.1)
Lymphs Abs: 4321 cells/uL — ABNORMAL HIGH (ref 850–3900)
MCH: 27.7 pg (ref 27.0–33.0)
MCHC: 33.6 g/dL (ref 32.0–36.0)
MCV: 82.5 fL (ref 80.0–100.0)
MPV: 10.7 fL (ref 7.5–12.5)
Monocytes Relative: 7.5 %
Neutro Abs: 5962 cells/uL (ref 1500–7800)
Neutrophils Relative %: 52.3 %
Platelets: 454 10*3/uL — ABNORMAL HIGH (ref 140–400)
RBC: 5.73 10*6/uL (ref 4.20–5.80)
RDW: 11.9 % (ref 11.0–15.0)
Total Lymphocyte: 37.9 %
WBC: 11.4 10*3/uL — ABNORMAL HIGH (ref 3.8–10.8)

## 2019-12-07 DIAGNOSIS — G4709 Other insomnia: Secondary | ICD-10-CM | POA: Diagnosis not present

## 2019-12-07 DIAGNOSIS — F900 Attention-deficit hyperactivity disorder, predominantly inattentive type: Secondary | ICD-10-CM | POA: Diagnosis not present

## 2019-12-07 DIAGNOSIS — F331 Major depressive disorder, recurrent, moderate: Secondary | ICD-10-CM | POA: Diagnosis not present

## 2019-12-07 DIAGNOSIS — F341 Dysthymic disorder: Secondary | ICD-10-CM | POA: Diagnosis not present

## 2020-01-20 NOTE — Patient Instructions (Addendum)
It was good to see you again today, I will be in touch with your labs as soon as possible Work on gradually getting into better physical condition with regular exercise, and clean diet

## 2020-01-20 NOTE — Progress Notes (Addendum)
[place] at [facility] [address] [place], [place] [address] [phone]  Date:  01/21/2020   Name:  Alexander Harrington   DOB:  04/07/1991   MRN:  [id number]  PCP:  Darreld Mclean, MD    Chief Complaint: Lab Work (follow up)   History of Present Illness:  Alexander Harrington is a 29 y.o. very pleasant male patient who presents with the following:  Young man with history of mental illness (bipolar disorder depression) and overweight, here today with concern of follow-up His psychiatrist is Dr. Toy Care Last saw him in January of this year for physical At that time mildly elevated LFTs were noted to be back to normal, encouraged him to work on exercise and weight loss due to dyslipidemia He was noted to have mild leukocytosis and thrombocytosis-we will recheck this today  He had tried to lose weight but was not able to stick with it.  His weight is about the same as it was  He was making some efforts to exercise and eat less, but has stopped exercising due to heat.   He notes that he tried to do a 3 mile hike in the [place] recently with friends and could not keep up   Covid series complete Patient Active Problem List   Diagnosis Date Noted  . Bipolar I disorder, most recent episode depressed ([place]) 08/07/2017    Class: Chronic  . Hoarseness of voice 10/04/2015  . Tachycardia 08/30/2015  . Leukocytosis 02/08/2015  . Other specified hypothyroidism 02/08/2015  . MDD (major depressive disorder), recurrent episode, severe ([place]) 12/01/2013  . Substance induced mood disorder ([place]) 04/11/2012    Class: Acute  . Severe episode of recurrent major depressive disorder ([place]) 04/09/2012    Class: Acute  . Keratosis pilaris 02/21/2011    Past Medical History:  Diagnosis Date  . Anxiety    Dr. Toy Care  . Bipolar 2 disorder ([place])   . Depression     History reviewed. No pertinent surgical history.  Social History   Tobacco Use  . Smoking  status: Never Smoker  . Smokeless tobacco: Never Used  Vaping Use  . Vaping Use: Never used  Substance Use Topics  . Alcohol use: Yes    Alcohol/week: 5.0 - 6.0 standard drinks    Types: 5 - 6 Cans of beer per week    Comment: once a week or every other week  . Drug use: Yes    Frequency: 1.0 times per week    Comment: THC this summer, occasional use    Family History  Problem Relation Age of Onset  . Arthritis Mother   . Mental illness Mother   . Depression Mother   . Anxiety disorder Mother   . Drug abuse Maternal Grandfather   . Alcohol abuse Maternal Grandfather   . Drug abuse Maternal Grandmother   . Alcohol abuse Maternal Grandmother     Allergies  Allergen Reactions  . Amphetamines Other (See Comments)    Caused depression that went away after stopping the amphetamine  . Benzodiazepines Other (See Comments)    Got cloudy thinking, loss of memory, suicidal thinking on Klonopin.  . Codeine Rash    Medication list has been reviewed and updated.  Current Outpatient Medications on File Prior to Visit  Medication Sig Dispense Refill  . guanFACINE (INTUNIV) 2 MG TB24 ER tablet Take 2 mg by mouth daily.    Marland Kitchen lithium carbonate (ESKALITH) 450 MG  CR tablet Take 450 mg by mouth at bedtime.    Marland Kitchen OLANZapine-FLUoxetine (SYMBYAX) 3-25 MG capsule     . propranolol (INDERAL) 20 MG tablet Take 20 mg by mouth 3 (three) times daily.    . QUEtiapine Fumarate (SEROQUEL XR) 150 MG 24 hr tablet Take 150 mg by mouth at bedtime.    . traZODone (DESYREL) 50 MG tablet Take 50-150 mg by mouth at bedtime as needed.    . TRINTELLIX 10 MG TABS tablet Take 10 mg by mouth daily.    . Cholecalciferol (VITAMIN D) 50 MCG (2000 UT) tablet Take 2,000 Units by mouth daily. (Patient not taking: Reported on 01/21/2020)     No current facility-administered medications on file prior to visit.    Review of Systems:  As per HPI- otherwise negative.   Physical Examination: Vitals:   01/21/20 0909   BP: 126/80  Pulse: 90  Resp: 18  SpO2: 97%   Vitals:   01/21/20 0909  Weight: 250 lb (113.4 kg)  Height: 5\' 10"  (1.778 m)   Body mass index is 35.87 kg/m. Ideal Body Weight: Weight in (lb) to have BMI = 25: 173.9  GEN: no acute distress.    Obese, looks well  HEENT: Atraumatic, Normocephalic.  Ears and Nose: No external deformity. CV: RRR, No M/G/R. No JVD. No thrill. No extra heart sounds. PULM: CTA B, no wheezes, crackles, rhonchi. No retractions. No resp. distress. No accessory muscle use. ABD: S, NT, ND. No rebound. No HSM. EXTR: No c/c/e PSYCH: Normally interactive. Conversant.   Wt Readings from Last 3 Encounters:  01/21/20 250 lb (113.4 kg)  08/13/19 248 lb (112.5 kg)  08/03/19 245 lb (111.1 kg)   He has not lost any weight unfortunately  Assessment and Plan: Encounter for hepatitis C screening test for low risk patient - Plan: Hepatitis C antibody  Leukocytosis, unspecified type - Plan: CBC  Dyslipidemia - Plan: Lipid panel  Vitamin D deficiency - Plan: VITAMIN D 25 Hydroxy (Vit-D Deficiency, Fractures)  Obesity (BMI 30-39.9)  Physical deconditioning labs pending as above Discussed diet and weight loss in detail with pt.  Encouraged him to estimate his current daily calorie intake (perhaps use myfitness pal)- then try to decrease by about 300 cal per day and add in exercise, this will likely result in weight loss Asked him to see me in 4-6 months to check on his progress  We discussed the difficulty he had while hiking with friends.  He did not notice any wheezing but had a hard time breathing.  This is likely due to deconditioning but I asked him to let me know if he does not note improvement with an exercise program, sooner if he is getting worse or notes any wheezing   This visit occurred during the SARS-CoV-2 public health emergency.  Safety protocols were in place, including screening questions prior to the visit, additional usage of staff PPE, and  extensive cleaning of exam room while observing appropriate contact time as indicated for disinfecting solutions.    Signed Lamar Blinks, MD  Received his labs as below, message to patient His triglycerides unfortunately are even worse Results for orders placed or performed in visit on 01/21/20  CBC  Result Value Ref Range   WBC 10.1 4.0 - 10.5 K/uL   RBC 5.61 4.22 - 5.81 Mil/uL   Platelets 408.0 (H) 150 - 400 K/uL   Hemoglobin 15.7 13.0 - 17.0 g/dL   HCT 46.8 39 - 52 %  MCV 83.4 78.0 - 100.0 fl   MCHC 33.6 30.0 - 36.0 g/dL   RDW 12.7 11.5 - 15.5 %  Lipid panel  Result Value Ref Range   Cholesterol 217 (H) 0 - 200 mg/dL   Triglycerides (H) 0 - 149 mg/dL    582.0 Triglyceride is over 400; calculations on Lipids are invalid.   HDL 33.60 (L) >39.00 mg/dL   Total CHOL/HDL Ratio 6   VITAMIN D 25 Hydroxy (Vit-D Deficiency, Fractures)  Result Value Ref Range   VITD 23.05 (L) 30.00 - 100.00 ng/mL  LDL cholesterol, direct  Result Value Ref Range   Direct LDL 110.0 mg/dL

## 2020-01-21 ENCOUNTER — Encounter: Payer: Self-pay | Admitting: Family Medicine

## 2020-01-21 ENCOUNTER — Other Ambulatory Visit: Payer: Self-pay

## 2020-01-21 ENCOUNTER — Ambulatory Visit (INDEPENDENT_AMBULATORY_CARE_PROVIDER_SITE_OTHER): Payer: BC Managed Care – PPO | Admitting: Family Medicine

## 2020-01-21 VITALS — BP 126/80 | HR 90 | Resp 18 | Ht 70.0 in | Wt 250.0 lb

## 2020-01-21 DIAGNOSIS — E669 Obesity, unspecified: Secondary | ICD-10-CM

## 2020-01-21 DIAGNOSIS — D72829 Elevated white blood cell count, unspecified: Secondary | ICD-10-CM | POA: Diagnosis not present

## 2020-01-21 DIAGNOSIS — R5381 Other malaise: Secondary | ICD-10-CM

## 2020-01-21 DIAGNOSIS — E785 Hyperlipidemia, unspecified: Secondary | ICD-10-CM

## 2020-01-21 DIAGNOSIS — E559 Vitamin D deficiency, unspecified: Secondary | ICD-10-CM | POA: Diagnosis not present

## 2020-01-21 DIAGNOSIS — Z1159 Encounter for screening for other viral diseases: Secondary | ICD-10-CM

## 2020-01-21 LAB — CBC
HCT: 46.8 % (ref 39.0–52.0)
Hemoglobin: 15.7 g/dL (ref 13.0–17.0)
MCHC: 33.6 g/dL (ref 30.0–36.0)
MCV: 83.4 fl (ref 78.0–100.0)
Platelets: 408 10*3/uL — ABNORMAL HIGH (ref 150.0–400.0)
RBC: 5.61 Mil/uL (ref 4.22–5.81)
RDW: 12.7 % (ref 11.5–15.5)
WBC: 10.1 10*3/uL (ref 4.0–10.5)

## 2020-01-21 LAB — VITAMIN D 25 HYDROXY (VIT D DEFICIENCY, FRACTURES): VITD: 23.05 ng/mL — ABNORMAL LOW (ref 30.00–100.00)

## 2020-01-21 LAB — LIPID PANEL
Cholesterol: 217 mg/dL — ABNORMAL HIGH (ref 0–200)
HDL: 33.6 mg/dL — ABNORMAL LOW (ref >39.00)
Total CHOL/HDL Ratio: 6
Triglycerides: 582 mg/dL — ABNORMAL HIGH (ref 0.0–149.0)

## 2020-01-21 LAB — LDL CHOLESTEROL, DIRECT: Direct LDL: 110 mg/dL

## 2020-01-22 LAB — HEPATITIS C ANTIBODY
Hepatitis C Ab: NONREACTIVE
SIGNAL TO CUT-OFF: 0.02 (ref <1.00)

## 2020-01-22 MED ORDER — ROSUVASTATIN CALCIUM 10 MG PO TABS: 10.0000 mg | ORAL_TABLET | Freq: Every day | ORAL | 3 refills | Status: DC

## 2020-02-10 ENCOUNTER — Ambulatory Visit: Payer: BC Managed Care – PPO | Admitting: Family Medicine

## 2020-06-03 DIAGNOSIS — E785 Hyperlipidemia, unspecified: Secondary | ICD-10-CM | POA: Insufficient documentation

## 2020-06-03 DIAGNOSIS — F410 Panic disorder [episodic paroxysmal anxiety] without agoraphobia: Secondary | ICD-10-CM | POA: Diagnosis not present

## 2020-06-03 DIAGNOSIS — F331 Major depressive disorder, recurrent, moderate: Secondary | ICD-10-CM | POA: Diagnosis not present

## 2020-06-03 NOTE — Patient Instructions (Addendum)
Good to see you again today!  I will send your labs to Dr Toy Care I would suggest trying crestor 1/2 tablet every other day.  If you cannot tolerate this lower dosing let me know and we will try something else for you covid boosters are available at your drug store

## 2020-06-03 NOTE — Progress Notes (Addendum)
[facility] [place], [place], [place] [address] [phone]  Date:  06/08/2020   Name:  Alexander Harrington   DOB:  21-Feb-1991   MRN:  [id number]  PCP:  Darreld Mclean, MD    Chief Complaint: Depression (4 month follow up) and Cough (cough, comes and goes 3 weeks, vaccinated)   History of Present Illness:  Alexander Harrington is a 29 y.o. very pleasant male patient who presents with the following:  Periodic follow-up visit today Last seen by myself in July History of mental illness (bipolar disorder depression) and overweight, here today for routine follow-up His psychiatrist is Dr. Toy Care- most recent visit she increased his seroquel dose  He needs a CMP and TSH, lipids per her request- will send labs to her when they come in   Flu vaccine- give today  covid series- done, encouraged a booster  CBC and lipids done in July- we started on crestor at that time for hyperlipidemia  Pt reports he has not been taking Crestor much due to muscle pains He took for maybe 2 months and then did not refill.  He was not able to tolerate 10 mg a day  He has noted some intermittent cough off and on for several months No fever He is not coughing anything up generally  He is not using any allergy meds right now   Noted tachycardia and elevated blood pressure today.  Patient notes he has been suffering from more frequent panic attacks.  He is working with his psychiatrist about this issue Lab Results  Component Value Date   HGBA1C 5.0 07/03/2018     Patient Active Problem List   Diagnosis Date Noted  . Hyperlipidemia 06/03/2020  . Bipolar I disorder, most recent episode depressed ([place]) 08/07/2017    Class: Chronic  . Hoarseness of voice 10/04/2015  . Tachycardia 08/30/2015  . Leukocytosis 02/08/2015  . Other specified hypothyroidism 02/08/2015  . MDD (major depressive disorder), recurrent episode, severe ([place]) 12/01/2013  .  Substance induced mood disorder ([place]) 04/11/2012    Class: Acute  . Severe episode of recurrent major depressive disorder ([place]) 04/09/2012    Class: Acute  . Keratosis pilaris 02/21/2011    Past Medical History:  Diagnosis Date  . Anxiety    Dr. Toy Care  . Bipolar 2 disorder ([place])   . Depression     No past surgical history on file.  Social History   Tobacco Use  . Smoking status: Never Smoker  . Smokeless tobacco: Never Used  Vaping Use  . Vaping Use: Never used  Substance Use Topics  . Alcohol use: Yes    Alcohol/week: 5.0 - 6.0 standard drinks    Types: 5 - 6 Cans of beer per week    Comment: once a week or every other week  . Drug use: Yes    Frequency: 1.0 times per week    Comment: THC this summer, occasional use    Family History  Problem Relation Age of Onset  . Arthritis Mother   . Mental illness Mother   . Depression Mother   . Anxiety disorder Mother   . Drug abuse Maternal Grandfather   . Alcohol abuse Maternal Grandfather   . Drug abuse Maternal Grandmother   . Alcohol abuse Maternal Grandmother     Allergies  Allergen Reactions  . Amphetamines Other (See Comments)    Caused depression that went away after stopping the amphetamine  .  Benzodiazepines Other (See Comments)    Got cloudy thinking, loss of memory, suicidal thinking on Klonopin.  . Codeine Rash  . Crestor [Rosuvastatin] Other (See Comments)    Myalgia    Medication list has been reviewed and updated.  Current Outpatient Medications on File Prior to Visit  Medication Sig Dispense Refill  . lithium carbonate (ESKALITH) 450 MG CR tablet Take 450 mg by mouth at bedtime.    . propranolol (INDERAL) 20 MG tablet Take 20 mg by mouth 3 (three) times daily.    . QUEtiapine (SEROQUEL) 300 MG tablet Take 300 mg by mouth at bedtime.    . traZODone (DESYREL) 50 MG tablet Take 50-150 mg by mouth at bedtime as needed.    . TRINTELLIX 10 MG TABS tablet Take 10 mg by mouth daily.    . rosuvastatin  (CRESTOR) 10 MG tablet Take 1 tablet (10 mg total) by mouth daily. (Patient not taking: Reported on 06/08/2020) 90 tablet 3   No current facility-administered medications on file prior to visit.    Review of Systems:  As per HPI- otherwise negative.  BP Readings from Last 3 Encounters:  06/08/20 (!) 138/98  01/21/20 126/80  08/13/19 122/86   Pulse Readings from Last 3 Encounters:  06/08/20 (!) 112  01/21/20 90  08/13/19 99      Physical Examination: Vitals:   06/08/20 0908  BP: (!) 138/98  Pulse: (!) 112  Resp: 18  SpO2: 98%   Vitals:   06/08/20 0908  Weight: 249 lb (112.9 kg)  Height: 5\' 10"  (1.778 m)   Body mass index is 35.73 kg/m. Ideal Body Weight: Weight in (lb) to have BMI = 25: 173.9  GEN: no acute distress.  Obese, looks well HEENT: Atraumatic, Normocephalic.  Ears and Nose: No external deformity. CV: RRR-tachycardic, No M/G/R. No JVD. No thrill. No extra heart sounds. PULM: CTA B, no wheezes, crackles, rhonchi. No retractions. No resp. distress. No accessory muscle use. ABD: S, NT, ND EXTR: No c/c/e PSYCH: Normally interactive. Conversant.   EKG: SR, no abnl Compared to tracing from 12/19 no significant change noted  Pulse rate back to normal on EKG Assessment and Plan: Screening for diabetes mellitus - Plan: Hemoglobin A1c  Hyperlipidemia, unspecified hyperlipidemia type - Plan: Lipid panel  Medication monitoring encounter - Plan: Lipid panel, CBC, Comprehensive metabolic panel, TSH  Tachycardia - Plan: EKG 12-Lead  Immunization due  Elevated BP without diagnosis of hypertension - Plan: EKG 12-Lead  Needs flu shot - Plan: Flu Vaccine QUAD 6+ mos PF IM (Fluarix Quad PF)  Patient here today for follow-up visit.  We will check labs for him as above Gave flu vaccine Encouraged COVID-19 booster  Noted acute tachycardia and elevated blood pressure.  Most likely this is due to anxiety, and has been suffering from more anxiety and panic attacks  recently.  EKG is reassuring and showed heart rate under 100 bpm  He has noted a mild cough that seems to come and go.  He otherwise feels well.  Suspect this is due to allergies, suggested trial of an over-the-counter allergy medication He has never been a smoker  Will plan further follow- up pending labs.  Monitoring his seroquel and lithium (although did not get lithium level) on behalf of his psychiatrist  This visit occurred during the SARS-CoV-2 public health emergency.  Safety protocols were in place, including screening questions prior to the visit, additional usage of staff PPE, and extensive cleaning of exam room while observing  appropriate contact time as indicated for disinfecting solutions.   Send labs to Dr Toy Care Signed Lamar Blinks, MD  Addendum 11/18 Received labs as below-message to patient  Results for orders placed or performed in visit on 06/08/20  Hemoglobin A1c  Result Value Ref Range   Hgb A1c MFr Bld 5.1 4.6 - 6.5 %  Lipid panel  Result Value Ref Range   Cholesterol 207 (H) 0 - 200 mg/dL   Triglycerides 378.0 (H) 0 - 149 mg/dL   HDL 36.90 (L) >39.00 mg/dL   VLDL 75.6 (H) 0.0 - 40.0 mg/dL   Total CHOL/HDL Ratio 6    NonHDL 170.41   CBC  Result Value Ref Range   WBC 17.9 (H) 4.0 - 10.5 K/uL   RBC 5.64 4.22 - 5.81 Mil/uL   Platelets 376.0 150 - 400 K/uL   Hemoglobin 15.4 13.0 - 17.0 g/dL   HCT 46.7 39 - 52 %   MCV 82.8 78.0 - 100.0 fl   MCHC 33.1 30.0 - 36.0 g/dL   RDW 12.9 11.5 - 15.5 %  Comprehensive metabolic panel  Result Value Ref Range   Sodium 139 135 - 145 mEq/L   Potassium 4.4 3.5 - 5.1 mEq/L   Chloride 103 96 - 112 mEq/L   CO2 29 19 - 32 mEq/L   Glucose, Bld 83 70 - 99 mg/dL   BUN 11 6 - 23 mg/dL   Creatinine, Ser 0.88 0.40 - 1.50 mg/dL   Total Bilirubin 0.3 0.2 - 1.2 mg/dL   Alkaline Phosphatase 55 39 - 117 U/L   AST 20 0 - 37 U/L   ALT 29 0 - 53 U/L   Total Protein 7.4 6.0 - 8.3 g/dL   Albumin 4.5 3.5 - 5.2 g/dL   GFR 116.22  >60.00 mL/min   Calcium 9.8 8.4 - 10.5 mg/dL  TSH  Result Value Ref Range   TSH 1.12 0.35 - 4.50 uIU/mL  LDL cholesterol, direct  Result Value Ref Range   Direct LDL 124.0 mg/dL

## 2020-06-08 ENCOUNTER — Other Ambulatory Visit: Payer: Self-pay

## 2020-06-08 ENCOUNTER — Ambulatory Visit (INDEPENDENT_AMBULATORY_CARE_PROVIDER_SITE_OTHER): Payer: BC Managed Care – PPO | Admitting: Family Medicine

## 2020-06-08 ENCOUNTER — Encounter: Payer: Self-pay | Admitting: Family Medicine

## 2020-06-08 VITALS — BP 138/98 | HR 99 | Resp 18 | Ht 70.0 in | Wt 249.0 lb

## 2020-06-08 DIAGNOSIS — R030 Elevated blood-pressure reading, without diagnosis of hypertension: Secondary | ICD-10-CM

## 2020-06-08 DIAGNOSIS — Z5181 Encounter for therapeutic drug level monitoring: Secondary | ICD-10-CM

## 2020-06-08 DIAGNOSIS — Z23 Encounter for immunization: Secondary | ICD-10-CM | POA: Diagnosis not present

## 2020-06-08 DIAGNOSIS — E785 Hyperlipidemia, unspecified: Secondary | ICD-10-CM | POA: Diagnosis not present

## 2020-06-08 DIAGNOSIS — R000 Tachycardia, unspecified: Secondary | ICD-10-CM

## 2020-06-08 DIAGNOSIS — D72829 Elevated white blood cell count, unspecified: Secondary | ICD-10-CM

## 2020-06-08 DIAGNOSIS — Z131 Encounter for screening for diabetes mellitus: Secondary | ICD-10-CM

## 2020-06-08 LAB — COMPREHENSIVE METABOLIC PANEL
ALT: 29 U/L (ref 0–53)
AST: 20 U/L (ref 0–37)
Albumin: 4.5 g/dL (ref 3.5–5.2)
Alkaline Phosphatase: 55 U/L (ref 39–117)
BUN: 11 mg/dL (ref 6–23)
CO2: 29 mEq/L (ref 19–32)
Calcium: 9.8 mg/dL (ref 8.4–10.5)
Chloride: 103 mEq/L (ref 96–112)
Creatinine, Ser: 0.88 mg/dL (ref 0.40–1.50)
GFR: 116.22 mL/min (ref >60.00)
Glucose, Bld: 83 mg/dL (ref 70–99)
Potassium: 4.4 mEq/L (ref 3.5–5.1)
Sodium: 139 mEq/L (ref 135–145)
Total Bilirubin: 0.3 mg/dL (ref 0.2–1.2)
Total Protein: 7.4 g/dL (ref 6.0–8.3)

## 2020-06-08 LAB — CBC
HCT: 46.7 % (ref 39.0–52.0)
Hemoglobin: 15.4 g/dL (ref 13.0–17.0)
MCHC: 33.1 g/dL (ref 30.0–36.0)
MCV: 82.8 fl (ref 78.0–100.0)
Platelets: 376 10*3/uL (ref 150.0–400.0)
RBC: 5.64 Mil/uL (ref 4.22–5.81)
RDW: 12.9 % (ref 11.5–15.5)
WBC: 17.9 10*3/uL — ABNORMAL HIGH (ref 4.0–10.5)

## 2020-06-08 LAB — HEMOGLOBIN A1C: Hgb A1c MFr Bld: 5.1 % (ref 4.6–6.5)

## 2020-06-08 LAB — LDL CHOLESTEROL, DIRECT: Direct LDL: 124 mg/dL

## 2020-06-08 LAB — LIPID PANEL
Cholesterol: 207 mg/dL — ABNORMAL HIGH (ref 0–200)
HDL: 36.9 mg/dL — ABNORMAL LOW (ref >39.00)
NonHDL: 170.41
Total CHOL/HDL Ratio: 6
Triglycerides: 378 mg/dL — ABNORMAL HIGH (ref 0.0–149.0)
VLDL: 75.6 mg/dL — ABNORMAL HIGH (ref 0.0–40.0)

## 2020-06-08 LAB — TSH: TSH: 1.12 u[IU]/mL (ref 0.35–4.50)

## 2020-06-09 ENCOUNTER — Telehealth: Payer: Self-pay | Admitting: Physician Assistant

## 2020-06-09 ENCOUNTER — Encounter: Payer: Self-pay | Admitting: Family Medicine

## 2020-06-09 ENCOUNTER — Telehealth: Payer: Self-pay | Admitting: Hematology and Oncology

## 2020-06-09 NOTE — Telephone Encounter (Signed)
Received a new hem referral from Dr. Lorelei Pont for leukocytosis. Alexander Harrington has been cld and scheduled to see [name] on 12/6 at 130pm w/labs at 1pm. Pt aware to arrive 30 minutes early.

## 2020-06-09 NOTE — Addendum Note (Signed)
Addended by: Lamar Blinks C on: 06/09/2020 05:43 AM   Modules accepted: Orders

## 2020-06-09 NOTE — Telephone Encounter (Signed)
Created in error

## 2020-06-26 NOTE — Progress Notes (Signed)
[facility] [phone]   [phone]  CONSULT NOTE  REFERRING PHYSICIAN: Dr. Lorelei Pont  REASON FOR CONSULTATION:  Leukocytosis  HPI Alexander Harrington is a 29 y.o. male with a past medical history significant for substance induced mood disorder, bipolar type I, hyperlipidemia, and major depression is referred to the clinic for evaluation of leukocytosis.  The patient recently had an appointment with his PCP on 06/08/2020.  The patient had repeat lab work performed that day which showed a total white blood cell count elevated at 17.9, the patient's platelets and hemoglobin were within normal limits.  Upon comparison to lab work performed from 2012-2012, the patient WBC has fluctuated between 8k-22k. The patient had pneumonia in the past with the 22k.  5 months prior to his recent lab work, his WBC were 10.1 with slightly elevated platelets at 408k. The patient was referred to the clinic today for further evaluation and work-up into his condition.  The patient denies any recent fever, chills, night sweats, or recent weight loss. A few months ago the patient lost about 15 lbs due to stress but he states he gained the weight back. He has been reporting a cough for several months. He estimates the cough started in October. The cough fluctuates and some days it is worse than others. He produces phlegm. The cough is worse at night. The patient takes mucinex DM and drinks tea. The patient states his PCP mentions that she will offer a CXR if no improvement in his cough. The patient also endorses reflux like symptoms at night which sometimes wakes him up. The patient denies any lymphadenopathy.  The patient denies any nausea, vomiting, unusual diarrhea, or constipation. He denies abdominal fullness/pain or early satiety.  Patient denies any recent viral infections.  The patient denies any recent  skin infections or dysuria. He notes a occasional mild sore throat and some nasal  congestion for which he has flonase. He used to take zyrtec but stopped. The patient denies any recent steroid use.  The patient does not smoke cigarettes.  The patient denies any new medications.Of note, he does take lithium.  The patient denies any history of any autoimmune disorders. The patient denies any abnormal bleeding or bruising.  The patient denies any family history of any malignancies or hematologic abnormalities.  The patient's mother has mental health problems, insomnia, and hyperlipidemia.  The patient's father has primarily back problems as well as insomnia as well as being lactose intolerant.   The patient is currently unemployed and states that he has been stressed recently.  He is single does not have any children.  He denies any street drug use except he occasionally uses edibles.  The patient drinks approximately 3-4 beers socially 1-2 times per week. HPI  Past Medical History:  Diagnosis Date  . Anxiety    Dr. Toy Care  . Bipolar 2 disorder ([place])   . Depression     No past surgical history on file.  Family History  Problem Relation Age of Onset  . Arthritis Mother   . Mental illness Mother   . Depression Mother   . Anxiety disorder Mother   . Drug abuse Maternal Grandfather   . Alcohol abuse Maternal Grandfather   . Drug abuse Maternal Grandmother   . Alcohol abuse Maternal Grandmother     Social History Social History   Tobacco Use  . Smoking status: Never Smoker  . Smokeless tobacco: Never Used  Vaping Use  . Vaping Use:  Never used  Substance Use Topics  . Alcohol use: Yes    Alcohol/week: 5.0 - 6.0 standard drinks    Types: 5 - 6 Cans of beer per week    Comment: once a week or every other week  . Drug use: Yes    Frequency: 1.0 times per week    Types: Marijuana    Comment: Occasional use    Allergies  Allergen Reactions  . Amphetamines Other (See Comments)    Caused depression that went away after stopping the amphetamine  . Benzodiazepines  Other (See Comments)    Got cloudy thinking, loss of memory, suicidal thinking on Klonopin.  . Codeine Rash  . Crestor [Rosuvastatin] Other (See Comments)    Myalgia    Current Outpatient Medications  Medication Sig Dispense Refill  . busPIRone (BUSPAR) 10 MG tablet Take 10 mg by mouth 2 (two) times daily.    . clonazePAM (KLONOPIN) 1 MG tablet Take 1 mg by mouth 3 (three) times daily as needed.    . lithium carbonate (ESKALITH) 450 MG CR tablet Take 450 mg by mouth at bedtime.    . propranolol (INDERAL) 20 MG tablet Take 20 mg by mouth 3 (three) times daily.    . QUEtiapine (SEROQUEL) 300 MG tablet Take 300 mg by mouth at bedtime.    . traZODone (DESYREL) 50 MG tablet Take 50-150 mg by mouth at bedtime as needed.    . TRINTELLIX 10 MG TABS tablet Take 10 mg by mouth daily.    . rosuvastatin (CRESTOR) 10 MG tablet Take 1 tablet (10 mg total) by mouth daily. (Patient not taking: Reported on 06/27/2020) 90 tablet 3   No current facility-administered medications for this visit.    REVIEW OF SYSTEMS:   Review of Systems  Constitutional: Negative for appetite change, chills, fatigue, fever and unexpected weight change.  HENT: Positive for occasional nasal congestion and mild sore throat. Negative for mouth sores, nosebleeds, and trouble swallowing.   Eyes: Negative for eye problems and icterus.  Respiratory: Positive for intermittent cough. Negative for hemoptysis, shortness of breath and wheezing.   Cardiovascular: Negative for chest pain and leg swelling.  Gastrointestinal: Negative for abdominal pain, constipation, diarrhea, nausea and vomiting.  Genitourinary: Negative for bladder incontinence, difficulty urinating, dysuria, frequency and hematuria.   Musculoskeletal: Negative for back pain, gait problem, neck pain and neck stiffness.  Skin: Negative for itching and rash.  Neurological: Negative for dizziness, extremity weakness, gait problem, headaches, light-headedness and seizures.    Hematological: Negative for adenopathy. Does not bruise/bleed easily.  Psychiatric/Behavioral: Positive for stress. Negative for confusion, depression and sleep disturbance. The patient is not nervous/anxious.     PHYSICAL EXAMINATION:  Blood pressure 129/83, pulse (!) 110, temperature 98.6 F (37 C), temperature source Tympanic, resp. rate 19, height 5\' 10"  (1.778 m), weight 252 lb 3.2 oz (114.4 kg), SpO2 100 %.  ECOG PERFORMANCE STATUS: 0  Physical Exam  Constitutional: Oriented to person, place, and time and well-developed, well-nourished, and in no distress.  HENT:  Head: Normocephalic and atraumatic.  Mouth/Throat: Oropharynx is clear and moist. No oropharyngeal exudate.  Eyes: Conjunctivae are normal. Right eye exhibits no discharge. Left eye exhibits no discharge. No scleral icterus.  Neck: Normal range of motion. Neck supple.  Cardiovascular: Normal rate, regular rhythm, normal heart sounds and intact distal pulses.   Pulmonary/Chest: Effort normal and breath sounds normal. No respiratory distress. No wheezes. No rales.  Abdominal: Soft. Bowel sounds are normal. Exhibits no distension  and no mass. There is no tenderness.  Musculoskeletal: Normal range of motion. Exhibits no edema.  Lymphadenopathy:    No cervical adenopathy.  Neurological: Alert and oriented to person, place, and time. Exhibits normal muscle tone. Gait normal. Coordination normal.  Skin: Skin is warm and dry. No rash noted. Not diaphoretic. No erythema. No pallor.  Psychiatric: Mood, memory and judgment normal.  Vitals reviewed.  LABORATORY DATA: Lab Results  Component Value Date   WBC 10.3 06/27/2020   HGB 15.5 06/27/2020   HCT 47.4 06/27/2020   MCV 85.1 06/27/2020   PLT 446 (H) 06/27/2020      Chemistry      Component Value Date/Time   NA 141 06/27/2020 1305   K 3.9 06/27/2020 1305   CL 107 06/27/2020 1305   CO2 27 06/27/2020 1305   BUN 10 06/27/2020 1305   CREATININE 1.04 06/27/2020 1305       Component Value Date/Time   CALCIUM 9.1 06/27/2020 1305   ALKPHOS 57 06/27/2020 1305   AST 14 (L) 06/27/2020 1305   ALT 30 06/27/2020 1305   BILITOT 0.3 06/27/2020 1305       RADIOGRAPHIC STUDIES: No results found.  ASSESSMENT: This is a very pleasant 29 year old Caucasian male referred to the clinic for evaluation of leukocytosis.   PLAN: The patient was seen with Dr. Julien Nordmann today.  The patient had a repeat CBC, CMP, and LDH performed today.  Patient CBC from today shows a WBC within normal limits at 10.3. The patient's hemoglobin is within normal limits. The patient's platelets are elevated which may be reactive at 446k. The patient CMP was unremarkable.    Dr. Julien Nordmann believes that the patient's leukocytosis is likely reactive in nature which may be secondary to stress, inflammation, and/or drug side effect (lithium). Given his normal WBC count today, Dr. Julien Nordmann does feel that he requires molecular testing for a myeloproliferative process.   We will not schedule any follow-up appointments at this time and he can continue to follow with his PCP. If his WBC continues to elevate on routine labs or if he shows concerning symptoms (abnormal bleeding/bruising, night sweats, fevers, lymphadenopathy), then we would be happy to see that patient back for consideration of molecular testing.   We will defer the patient's cough workup to his PCP. The patient mentioned that they were considering an X ray. In the meantime, I encouraged the patient to trial out PPI since he is endorsing reflux symptoms to see if that helps his cough.   The patient voices understanding of current disease status and treatment options and is in agreement with the current care plan.  All questions were answered. The patient knows to call the clinic with any problems, questions or concerns. We can certainly see the patient much sooner if necessary.  Thank you so much for allowing me to participate in the care of  [facility]. I will continue to follow up the patient with you and assist in his care.  The total time spent in the appointment was 60 minutes.  Disclaimer: This note was dictated with voice recognition software. Similar sounding words can inadvertently be transcribed and may not be corrected upon review.   Cauy Melody L Rodert Hinch June 27, 2020, 2:27 PM  ADDENDUM: Hematology/Oncology Attending: I had a face-to-face encounter with the patient today.  I recommended his care plan.  This is a very pleasant 29 years old white male presented for evaluation of leukocytosis.  The patient has this problem for several  years and has been going up and down depending on his history of infection or inflammatory process.  The patient also on lithium for bipolar disorder. He was referred to [place] today for evaluation of the leukocytosis.  Repeat CBC today showed total white blood count was normal at 10.3 but he has a slightly elevated platelet count of 446,000. He has normal hemoglobin and hematocrit. I had a lengthy discussion with the patient today about his condition.  I explained to the patient that his leukocytosis is likely reactive in nature secondary to inflammatory process as well as the lithium. He also has recurrent upper respiratory infection and postnasal drainage. I recommended for the patient to continue his routine follow-up visit and evaluation by his primary care physician.  He may need referral to allergy medicine or a pulmonologist for evaluation of his recurrent postnasal drainage and bronchitis.  He may also benefit from treatment with PPI for GERD especially with his recurrent cough. I do not see a need to do extensive work-up for his leukocytosis at this point especially with his normal total white blood count today. We will see the patient as needed basis at this point. He was advised to call if he has any concerning symptoms.  Disclaimer: This note was dictated with voice  recognition software. Similar sounding words can inadvertently be transcribed and may be missed upon review. Eilleen Kempf, MD 06/27/20

## 2020-06-27 ENCOUNTER — Inpatient Hospital Stay: Payer: BC Managed Care – PPO

## 2020-06-27 ENCOUNTER — Inpatient Hospital Stay: Payer: BC Managed Care – PPO | Attending: Physician Assistant | Admitting: Physician Assistant

## 2020-06-27 ENCOUNTER — Other Ambulatory Visit: Payer: Self-pay | Admitting: Physician Assistant

## 2020-06-27 ENCOUNTER — Other Ambulatory Visit: Payer: Self-pay

## 2020-06-27 ENCOUNTER — Encounter: Payer: Self-pay | Admitting: Physician Assistant

## 2020-06-27 VITALS — BP 129/83 | HR 110 | Temp 98.6°F | Resp 19 | Ht 70.0 in | Wt 252.2 lb

## 2020-06-27 DIAGNOSIS — D72829 Elevated white blood cell count, unspecified: Secondary | ICD-10-CM | POA: Insufficient documentation

## 2020-06-27 LAB — CMP (CANCER CENTER ONLY)
ALT: 30 U/L (ref 0–44)
AST: 14 U/L — ABNORMAL LOW (ref 15–41)
Albumin: 4 g/dL (ref 3.5–5.0)
Alkaline Phosphatase: 57 U/L (ref 38–126)
Anion gap: 7 (ref 5–15)
BUN: 10 mg/dL (ref 6–20)
CO2: 27 mmol/L (ref 22–32)
Calcium: 9.1 mg/dL (ref 8.9–10.3)
Chloride: 107 mmol/L (ref 98–111)
Creatinine: 1.04 mg/dL (ref 0.61–1.24)
GFR, Estimated: >60 mL/min (ref >60)
Glucose, Bld: 90 mg/dL (ref 70–99)
Potassium: 3.9 mmol/L (ref 3.5–5.1)
Sodium: 141 mmol/L (ref 135–145)
Total Bilirubin: 0.3 mg/dL (ref 0.3–1.2)
Total Protein: 8 g/dL (ref 6.5–8.1)

## 2020-06-27 LAB — CBC WITH DIFFERENTIAL (CANCER CENTER ONLY)
Abs Immature Granulocytes: 0.03 10*3/uL (ref 0.00–0.07)
Basophils Absolute: 0.1 10*3/uL (ref 0.0–0.1)
Basophils Relative: 1 %
Eosinophils Absolute: 0.3 10*3/uL (ref 0.0–0.5)
Eosinophils Relative: 3 %
HCT: 47.4 % (ref 39.0–52.0)
Hemoglobin: 15.5 g/dL (ref 13.0–17.0)
Immature Granulocytes: 0 %
Lymphocytes Relative: 41 %
Lymphs Abs: 4.2 10*3/uL — ABNORMAL HIGH (ref 0.7–4.0)
MCH: 27.8 pg (ref 26.0–34.0)
MCHC: 32.7 g/dL (ref 30.0–36.0)
MCV: 85.1 fL (ref 80.0–100.0)
Monocytes Absolute: 0.7 10*3/uL (ref 0.1–1.0)
Monocytes Relative: 7 %
Neutro Abs: 4.9 10*3/uL (ref 1.7–7.7)
Neutrophils Relative %: 48 %
Platelet Count: 446 10*3/uL — ABNORMAL HIGH (ref 150–400)
RBC: 5.57 MIL/uL (ref 4.22–5.81)
RDW: 12 % (ref 11.5–15.5)
WBC Count: 10.3 10*3/uL (ref 4.0–10.5)
nRBC: 0 % (ref 0.0–0.2)

## 2020-06-27 LAB — LACTATE DEHYDROGENASE: LDH: 200 U/L — ABNORMAL HIGH (ref 98–192)

## 2020-07-30 NOTE — Progress Notes (Signed)
[place] at [facility] [address] [place], [place] [address] [phone]  Date:  08/01/2020   Name:  Alexander Harrington   DOB:  1991-07-21   MRN:  [id number]  PCP:  Darreld Mclean, MD    Chief Complaint: No chief complaint on file.   History of Present Illness:  Alexander Harrington is a 30 y.o. very pleasant male patient who presents with the following:  Virtual visit today for illness-cough  Patient with history of mood disorder, hyperlipidemia  Patient location is home, my location is office.  Patient identity confirmed with 2 factors, he gives consent for virtual visit today The patient and myself are present on the call Connected with pt via mychart video  COVID-19 booster- done Flu vaccine complete  Pt notes that he got sick the week prior toCchristmas. He got tested for COVID-19 on 12/24 - negative with PCR Started to get better, but over the last week or so the cough has returned, but otherwise he does not feel as sick as he did before  The cough will come and go  No fever noted  He is having some body aches and fatigue as well   He has tried nyquil, mucinex DM- this does seem to help   Pt notes he would like to change to crestor 5mg  tabs and try taking it every other day to see if he can tolerate.  He has the 10 mg tabs but they are too small to split easily.  He hopes that he can take this low-dose of Crestor in order to improve his cholesterol  He also has noted some acid reflux type sx- he may wake up with burning in his throat  This has been going on for the last few weeks.  Seems worse after certain foods, for example pizza  Patient Active Problem List   Diagnosis Date Noted  . Hyperlipidemia 06/03/2020  . Bipolar I disorder, most recent episode depressed ([place]) 08/07/2017    Class: Chronic  . Hoarseness of voice 10/04/2015  . Tachycardia 08/30/2015  . Leukocytosis 02/08/2015  . Other specified hypothyroidism  02/08/2015  . MDD (major depressive disorder), recurrent episode, severe ([place]) 12/01/2013  . Substance induced mood disorder ([place]) 04/11/2012    Class: Acute  . Severe episode of recurrent major depressive disorder ([place]) 04/09/2012    Class: Acute  . Keratosis pilaris 02/21/2011    Past Medical History:  Diagnosis Date  . Anxiety    Dr. Toy Care  . Bipolar 2 disorder ([place])   . Depression     No past surgical history on file.  Social History   Tobacco Use  . Smoking status: Never Smoker  . Smokeless tobacco: Never Used  Vaping Use  . Vaping Use: Never used  Substance Use Topics  . Alcohol use: Yes    Alcohol/week: 5.0 - 6.0 standard drinks    Types: 5 - 6 Cans of beer per week    Comment: once a week or every other week  . Drug use: Yes    Frequency: 1.0 times per week    Types: Marijuana    Comment: Occasional use    Family History  Problem Relation Age of Onset  . Arthritis Mother   . Mental illness Mother   . Depression Mother   . Anxiety disorder Mother   . Drug abuse Maternal Grandfather   . Alcohol abuse Maternal Grandfather   . Drug abuse Maternal Grandmother   .  Alcohol abuse Maternal Grandmother     Allergies  Allergen Reactions  . Amphetamines Other (See Comments)    Caused depression that went away after stopping the amphetamine  . Benzodiazepines Other (See Comments)    Got cloudy thinking, loss of memory, suicidal thinking on Klonopin.  . Codeine Rash  . Crestor [Rosuvastatin] Other (See Comments)    Myalgia    Medication list has been reviewed and updated.  Current Outpatient Medications on File Prior to Visit  Medication Sig Dispense Refill  . busPIRone (BUSPAR) 10 MG tablet Take 10 mg by mouth 2 (two) times daily.    . clonazePAM (KLONOPIN) 1 MG tablet Take 1 mg by mouth 3 (three) times daily as needed.    . lithium carbonate (ESKALITH) 450 MG CR tablet Take 450 mg by mouth at bedtime.    . propranolol (INDERAL) 20 MG tablet Take 20 mg by  mouth 3 (three) times daily.    . QUEtiapine (SEROQUEL) 300 MG tablet Take 300 mg by mouth at bedtime.    . rosuvastatin (CRESTOR) 10 MG tablet Take 1 tablet (10 mg total) by mouth daily. (Patient not taking: Reported on 06/27/2020) 90 tablet 3  . traZODone (DESYREL) 50 MG tablet Take 50-150 mg by mouth at bedtime as needed.    . TRINTELLIX 10 MG TABS tablet Take 10 mg by mouth daily.     No current facility-administered medications on file prior to visit.    Review of Systems:  As per HPI- otherwise negative.   Physical Examination: There were no vitals filed for this visit. There were no vitals filed for this visit. There is no height or weight on file to calculate BMI. Ideal Body Weight:    Pt observed via mychart video He looks well, his normal self.  Overweight, no distress  Temp 98.2  Assessment and Plan: Bronchitis - Plan: doxycycline (VIBRAMYCIN) 100 MG capsule  Cough - Plan: doxycycline (VIBRAMYCIN) 100 MG capsule  Dyslipidemia - Plan: rosuvastatin (CRESTOR) 5 MG tablet  Gastroesophageal reflux disease, unspecified whether esophagitis present - Plan: omeprazole (PRILOSEC) 40 MG capsule  Video used for entirely of visit today Patient visit today for a few concerns.  He would like to try an ultralow dose of Crestor, 5 mg every other day, to see if he can tolerate.  I prescribed 5 mg strength for him He notes classic symptoms of GERD.  Try Prilosec, try for 1 to 2 months and then reassess Patient also notes cough, concern for bronchitis.  He was tested for COVID-19 about 2 weeks ago.  Advised patient that it would be wise to get retested as COVID-19 is extremely prevalent right now.  In the meantime, called in a course of doxycycline for him to take for 10 days I have asked him to let me know if not improving soon, he states understanding and agreement  Signed Lamar Blinks, MD

## 2020-08-01 ENCOUNTER — Telehealth: Payer: Self-pay

## 2020-08-01 ENCOUNTER — Other Ambulatory Visit: Payer: Self-pay

## 2020-08-01 ENCOUNTER — Telehealth (INDEPENDENT_AMBULATORY_CARE_PROVIDER_SITE_OTHER): Payer: BC Managed Care – PPO | Admitting: Family Medicine

## 2020-08-01 DIAGNOSIS — K219 Gastro-esophageal reflux disease without esophagitis: Secondary | ICD-10-CM | POA: Diagnosis not present

## 2020-08-01 DIAGNOSIS — R059 Cough, unspecified: Secondary | ICD-10-CM | POA: Diagnosis not present

## 2020-08-01 DIAGNOSIS — E785 Hyperlipidemia, unspecified: Secondary | ICD-10-CM

## 2020-08-01 DIAGNOSIS — J40 Bronchitis, not specified as acute or chronic: Secondary | ICD-10-CM | POA: Diagnosis not present

## 2020-08-01 MED ORDER — ROSUVASTATIN CALCIUM 5 MG PO TABS: 5.0000 mg | ORAL_TABLET | Freq: Every day | ORAL | 3 refills | Status: DC

## 2020-08-01 MED ORDER — DOXYCYCLINE HYCLATE 100 MG PO CAPS: 100.0000 mg | ORAL_CAPSULE | Freq: Two times a day (BID) | ORAL | 0 refills | Status: DC

## 2020-08-01 MED ORDER — OMEPRAZOLE 40 MG PO CPDR: 40.0000 mg | DELAYED_RELEASE_CAPSULE | Freq: Every day | ORAL | 3 refills | Status: DC

## 2020-08-01 NOTE — Telephone Encounter (Signed)
PA initiated via Covermymeds; KEY: [id number]. Awaiting determination.

## 2020-08-02 NOTE — Telephone Encounter (Signed)
PA denied. Awaiting denial information.  °

## 2020-08-03 ENCOUNTER — Encounter: Payer: Self-pay | Admitting: Family Medicine

## 2020-08-03 NOTE — Telephone Encounter (Signed)
PA denied. Health benefit plan does not cover any medication that is the therapeutical equivalent of an over the counter medication.

## 2020-08-04 DIAGNOSIS — F3181 Bipolar II disorder: Secondary | ICD-10-CM | POA: Diagnosis not present

## 2020-08-19 DIAGNOSIS — F332 Major depressive disorder, recurrent severe without psychotic features: Secondary | ICD-10-CM | POA: Diagnosis not present

## 2020-08-26 DIAGNOSIS — F332 Major depressive disorder, recurrent severe without psychotic features: Secondary | ICD-10-CM | POA: Diagnosis not present

## 2020-08-30 DIAGNOSIS — F332 Major depressive disorder, recurrent severe without psychotic features: Secondary | ICD-10-CM | POA: Diagnosis not present

## 2020-09-02 DIAGNOSIS — F332 Major depressive disorder, recurrent severe without psychotic features: Secondary | ICD-10-CM | POA: Diagnosis not present

## 2020-09-06 DIAGNOSIS — F332 Major depressive disorder, recurrent severe without psychotic features: Secondary | ICD-10-CM | POA: Diagnosis not present

## 2020-09-08 DIAGNOSIS — F332 Major depressive disorder, recurrent severe without psychotic features: Secondary | ICD-10-CM | POA: Diagnosis not present

## 2020-09-09 DIAGNOSIS — F332 Major depressive disorder, recurrent severe without psychotic features: Secondary | ICD-10-CM | POA: Diagnosis not present

## 2020-09-20 DIAGNOSIS — F332 Major depressive disorder, recurrent severe without psychotic features: Secondary | ICD-10-CM | POA: Diagnosis not present

## 2020-10-03 DIAGNOSIS — F332 Major depressive disorder, recurrent severe without psychotic features: Secondary | ICD-10-CM | POA: Diagnosis not present

## 2020-10-07 DIAGNOSIS — F332 Major depressive disorder, recurrent severe without psychotic features: Secondary | ICD-10-CM | POA: Diagnosis not present

## 2020-10-10 DIAGNOSIS — F332 Major depressive disorder, recurrent severe without psychotic features: Secondary | ICD-10-CM | POA: Diagnosis not present

## 2020-10-14 DIAGNOSIS — F332 Major depressive disorder, recurrent severe without psychotic features: Secondary | ICD-10-CM | POA: Diagnosis not present

## 2020-11-03 DIAGNOSIS — F341 Dysthymic disorder: Secondary | ICD-10-CM | POA: Diagnosis not present

## 2020-11-03 DIAGNOSIS — F332 Major depressive disorder, recurrent severe without psychotic features: Secondary | ICD-10-CM | POA: Diagnosis not present

## 2020-11-04 DIAGNOSIS — F332 Major depressive disorder, recurrent severe without psychotic features: Secondary | ICD-10-CM | POA: Diagnosis not present

## 2020-11-11 DIAGNOSIS — F332 Major depressive disorder, recurrent severe without psychotic features: Secondary | ICD-10-CM | POA: Diagnosis not present

## 2020-11-15 DIAGNOSIS — F332 Major depressive disorder, recurrent severe without psychotic features: Secondary | ICD-10-CM | POA: Diagnosis not present

## 2020-11-18 DIAGNOSIS — F332 Major depressive disorder, recurrent severe without psychotic features: Secondary | ICD-10-CM | POA: Diagnosis not present

## 2020-11-22 DIAGNOSIS — F332 Major depressive disorder, recurrent severe without psychotic features: Secondary | ICD-10-CM | POA: Diagnosis not present

## 2020-11-25 DIAGNOSIS — F332 Major depressive disorder, recurrent severe without psychotic features: Secondary | ICD-10-CM | POA: Diagnosis not present

## 2020-11-29 DIAGNOSIS — F332 Major depressive disorder, recurrent severe without psychotic features: Secondary | ICD-10-CM | POA: Diagnosis not present

## 2020-12-02 DIAGNOSIS — F332 Major depressive disorder, recurrent severe without psychotic features: Secondary | ICD-10-CM | POA: Diagnosis not present

## 2020-12-03 NOTE — Patient Instructions (Incomplete)
It was good to see you again today, I will be in touch your labs are soon as possible.  Assuming all is well, please see me in about 6 months  Continue to work on healthy diet and exercise routine

## 2020-12-03 NOTE — Progress Notes (Deleted)
[place] at [facility] [address], [place], [place] [address] [phone]  Date:  12/07/2020   Name:  Alexander Harrington   DOB:  28-Jun-1991   MRN:  [id number]  PCP:  Darreld Mclean, MD    Chief Complaint: No chief complaint on file.   History of Present Illness:  Alexander Harrington is a 30 y.o. very pleasant male patient who presents with the following:  Patient seen today for follow-up visit Last visit with myself was a virtual visit for bronchitis in January  History of mental illness (bipolar disorder depression) and overweight, hyperlipidemia, hypothyroidism not currently treated His psychiatrist is Dr.Kaur   COVID-19 booster  Most recent labs were in December-can follow-up on lipids today Patient Active Problem List   Diagnosis Date Noted  . Hyperlipidemia 06/03/2020  . Bipolar I disorder, most recent episode depressed ([place]) 08/07/2017    Class: Chronic  . Hoarseness of voice 10/04/2015  . Tachycardia 08/30/2015  . Leukocytosis 02/08/2015  . Other specified hypothyroidism 02/08/2015  . MDD (major depressive disorder), recurrent episode, severe ([place]) 12/01/2013  . Substance induced mood disorder ([place]) 04/11/2012    Class: Acute  . Severe episode of recurrent major depressive disorder ([place]) 04/09/2012    Class: Acute  . Keratosis pilaris 02/21/2011    Past Medical History:  Diagnosis Date  . Anxiety    Dr. Toy Care  . Bipolar 2 disorder ([place])   . Depression     No past surgical history on file.  Social History   Tobacco Use  . Smoking status: Never Smoker  . Smokeless tobacco: Never Used  Vaping Use  . Vaping Use: Never used  Substance Use Topics  . Alcohol use: Yes    Alcohol/week: 5.0 - 6.0 standard drinks    Types: 5 - 6 Cans of beer per week    Comment: once a week or every other week  . Drug use: Yes    Frequency: 1.0 times per week    Types: Marijuana    Comment: Occasional use    Family  History  Problem Relation Age of Onset  . Arthritis Mother   . Mental illness Mother   . Depression Mother   . Anxiety disorder Mother   . Drug abuse Maternal Grandfather   . Alcohol abuse Maternal Grandfather   . Drug abuse Maternal Grandmother   . Alcohol abuse Maternal Grandmother     Allergies  Allergen Reactions  . Amphetamines Other (See Comments)    Caused depression that went away after stopping the amphetamine  . Benzodiazepines Other (See Comments)    Got cloudy thinking, loss of memory, suicidal thinking on Klonopin.  . Codeine Rash  . Crestor [Rosuvastatin] Other (See Comments)    Myalgia    Medication list has been reviewed and updated.  Current Outpatient Medications on File Prior to Visit  Medication Sig Dispense Refill  . busPIRone (BUSPAR) 10 MG tablet Take 10 mg by mouth 2 (two) times daily.    . clonazePAM (KLONOPIN) 1 MG tablet Take 1 mg by mouth 3 (three) times daily as needed.    . doxycycline (VIBRAMYCIN) 100 MG capsule Take 1 capsule (100 mg total) by mouth 2 (two) times daily. 20 capsule 0  . lithium carbonate (ESKALITH) 450 MG CR tablet Take 450 mg by mouth at bedtime.    Mar[place] Kitchen omeprazole (PRILOSEC) 40 MG capsule Take 1 capsule (40 mg total) by mouth daily. Take for 4-8  weeks and then reassess 30 capsule 3  . propranolol (INDERAL) 20 MG tablet Take 20 mg by mouth 3 (three) times daily.    . QUEtiapine (SEROQUEL) 300 MG tablet Take 300 mg by mouth at bedtime.    . rosuvastatin (CRESTOR) 5 MG tablet Take 1 tablet (5 mg total) by mouth daily. 90 tablet 3  . traZODone (DESYREL) 50 MG tablet Take 50-150 mg by mouth at bedtime as needed.    . TRINTELLIX 10 MG TABS tablet Take 10 mg by mouth daily.     No current facility-administered medications on file prior to visit.    Review of Systems:  As per HPI- otherwise negative.   Physical Examination: There were no vitals filed for this visit. There were no vitals filed for this visit. There is no height or  weight on file to calculate BMI. Ideal Body Weight:    GEN: no acute distress. HEENT: Atraumatic, Normocephalic.  Ears and Nose: No external deformity. CV: RRR, No M/G/R. No JVD. No thrill. No extra heart sounds. PULM: CTA B, no wheezes, crackles, rhonchi. No retractions. No resp. distress. No accessory muscle use. ABD: S, NT, ND, +BS. No rebound. No HSM. EXTR: No c/c/e PSYCH: Normally interactive. Conversant.    Assessment and Plan: *** This visit occurred during the SARS-CoV-2 public health emergency.  Safety protocols were in place, including screening questions prior to the visit, additional usage of staff PPE, and extensive cleaning of exam room while observing appropriate contact time as indicated for disinfecting solutions.    Signed Lamar Blinks, MD

## 2020-12-06 DIAGNOSIS — F332 Major depressive disorder, recurrent severe without psychotic features: Secondary | ICD-10-CM | POA: Diagnosis not present

## 2020-12-07 ENCOUNTER — Ambulatory Visit: Payer: BC Managed Care – PPO | Admitting: Family Medicine

## 2020-12-07 DIAGNOSIS — E785 Hyperlipidemia, unspecified: Secondary | ICD-10-CM

## 2020-12-07 DIAGNOSIS — Z5181 Encounter for therapeutic drug level monitoring: Secondary | ICD-10-CM

## 2020-12-07 DIAGNOSIS — E038 Other specified hypothyroidism: Secondary | ICD-10-CM

## 2020-12-09 DIAGNOSIS — F332 Major depressive disorder, recurrent severe without psychotic features: Secondary | ICD-10-CM | POA: Diagnosis not present

## 2020-12-14 DIAGNOSIS — F332 Major depressive disorder, recurrent severe without psychotic features: Secondary | ICD-10-CM | POA: Diagnosis not present

## 2020-12-20 DIAGNOSIS — F332 Major depressive disorder, recurrent severe without psychotic features: Secondary | ICD-10-CM | POA: Diagnosis not present

## 2020-12-23 DIAGNOSIS — F332 Major depressive disorder, recurrent severe without psychotic features: Secondary | ICD-10-CM | POA: Diagnosis not present

## 2020-12-27 DIAGNOSIS — F332 Major depressive disorder, recurrent severe without psychotic features: Secondary | ICD-10-CM | POA: Diagnosis not present

## 2021-01-03 DIAGNOSIS — F332 Major depressive disorder, recurrent severe without psychotic features: Secondary | ICD-10-CM | POA: Diagnosis not present

## 2021-01-10 DIAGNOSIS — F332 Major depressive disorder, recurrent severe without psychotic features: Secondary | ICD-10-CM | POA: Diagnosis not present

## 2021-01-12 DIAGNOSIS — Z5181 Encounter for therapeutic drug level monitoring: Secondary | ICD-10-CM | POA: Diagnosis not present

## 2021-01-12 DIAGNOSIS — F3176 Bipolar disorder, in full remission, most recent episode depressed: Secondary | ICD-10-CM | POA: Diagnosis not present

## 2021-01-12 DIAGNOSIS — Z8659 Personal history of other mental and behavioral disorders: Secondary | ICD-10-CM | POA: Diagnosis not present

## 2021-01-12 DIAGNOSIS — E785 Hyperlipidemia, unspecified: Secondary | ICD-10-CM | POA: Diagnosis not present

## 2021-01-12 DIAGNOSIS — R0683 Snoring: Secondary | ICD-10-CM | POA: Diagnosis not present

## 2021-01-12 DIAGNOSIS — F314 Bipolar disorder, current episode depressed, severe, without psychotic features: Secondary | ICD-10-CM | POA: Diagnosis not present

## 2021-01-12 DIAGNOSIS — F411 Generalized anxiety disorder: Secondary | ICD-10-CM | POA: Diagnosis not present

## 2021-01-12 DIAGNOSIS — G478 Other sleep disorders: Secondary | ICD-10-CM | POA: Diagnosis not present

## 2021-01-12 DIAGNOSIS — Z0000 Encounter for general adult medical examination without abnormal findings: Secondary | ICD-10-CM | POA: Diagnosis not present

## 2021-01-12 DIAGNOSIS — F329 Major depressive disorder, single episode, unspecified: Secondary | ICD-10-CM | POA: Diagnosis not present

## 2021-01-12 DIAGNOSIS — Z9189 Other specified personal risk factors, not elsewhere classified: Secondary | ICD-10-CM | POA: Diagnosis not present

## 2021-01-12 DIAGNOSIS — Z79899 Other long term (current) drug therapy: Secondary | ICD-10-CM | POA: Diagnosis not present

## 2021-01-12 DIAGNOSIS — R5383 Other fatigue: Secondary | ICD-10-CM | POA: Diagnosis not present

## 2021-01-12 DIAGNOSIS — E559 Vitamin D deficiency, unspecified: Secondary | ICD-10-CM | POA: Diagnosis not present

## 2021-01-12 DIAGNOSIS — F332 Major depressive disorder, recurrent severe without psychotic features: Secondary | ICD-10-CM | POA: Diagnosis not present

## 2021-01-12 DIAGNOSIS — I10 Essential (primary) hypertension: Secondary | ICD-10-CM | POA: Diagnosis not present

## 2021-01-19 ENCOUNTER — Other Ambulatory Visit: Payer: Self-pay | Admitting: Family Medicine

## 2021-01-19 DIAGNOSIS — F314 Bipolar disorder, current episode depressed, severe, without psychotic features: Secondary | ICD-10-CM | POA: Diagnosis not present

## 2021-01-19 DIAGNOSIS — Z8659 Personal history of other mental and behavioral disorders: Secondary | ICD-10-CM | POA: Diagnosis not present

## 2021-01-19 DIAGNOSIS — F3176 Bipolar disorder, in full remission, most recent episode depressed: Secondary | ICD-10-CM | POA: Diagnosis not present

## 2021-01-19 DIAGNOSIS — F411 Generalized anxiety disorder: Secondary | ICD-10-CM | POA: Diagnosis not present

## 2021-01-19 DIAGNOSIS — E785 Hyperlipidemia, unspecified: Secondary | ICD-10-CM

## 2021-01-20 ENCOUNTER — Encounter: Payer: Self-pay | Admitting: Family Medicine

## 2021-01-26 DIAGNOSIS — R0683 Snoring: Secondary | ICD-10-CM | POA: Diagnosis not present

## 2021-01-26 DIAGNOSIS — G478 Other sleep disorders: Secondary | ICD-10-CM | POA: Diagnosis not present

## 2021-01-26 DIAGNOSIS — R5383 Other fatigue: Secondary | ICD-10-CM | POA: Diagnosis not present

## 2021-01-26 DIAGNOSIS — Z9189 Other specified personal risk factors, not elsewhere classified: Secondary | ICD-10-CM | POA: Diagnosis not present

## 2021-01-27 DIAGNOSIS — Z0000 Encounter for general adult medical examination without abnormal findings: Secondary | ICD-10-CM | POA: Diagnosis not present

## 2021-01-27 DIAGNOSIS — I10 Essential (primary) hypertension: Secondary | ICD-10-CM | POA: Diagnosis not present

## 2021-01-27 DIAGNOSIS — F332 Major depressive disorder, recurrent severe without psychotic features: Secondary | ICD-10-CM | POA: Diagnosis not present

## 2021-01-27 DIAGNOSIS — E785 Hyperlipidemia, unspecified: Secondary | ICD-10-CM | POA: Diagnosis not present

## 2021-01-27 DIAGNOSIS — F411 Generalized anxiety disorder: Secondary | ICD-10-CM | POA: Diagnosis not present

## 2021-02-07 DIAGNOSIS — Z5181 Encounter for therapeutic drug level monitoring: Secondary | ICD-10-CM | POA: Diagnosis not present

## 2021-02-07 DIAGNOSIS — F329 Major depressive disorder, single episode, unspecified: Secondary | ICD-10-CM | POA: Diagnosis not present

## 2021-02-17 DIAGNOSIS — F332 Major depressive disorder, recurrent severe without psychotic features: Secondary | ICD-10-CM | POA: Diagnosis not present

## 2021-02-20 DIAGNOSIS — Z8659 Personal history of other mental and behavioral disorders: Secondary | ICD-10-CM | POA: Diagnosis not present

## 2021-02-20 DIAGNOSIS — F3341 Major depressive disorder, recurrent, in partial remission: Secondary | ICD-10-CM | POA: Diagnosis not present

## 2021-02-20 DIAGNOSIS — F411 Generalized anxiety disorder: Secondary | ICD-10-CM | POA: Diagnosis not present

## 2021-02-20 DIAGNOSIS — F413 Other mixed anxiety disorders: Secondary | ICD-10-CM | POA: Diagnosis not present

## 2021-02-21 DIAGNOSIS — F332 Major depressive disorder, recurrent severe without psychotic features: Secondary | ICD-10-CM | POA: Diagnosis not present

## 2021-02-22 DIAGNOSIS — F3341 Major depressive disorder, recurrent, in partial remission: Secondary | ICD-10-CM | POA: Diagnosis not present

## 2021-02-22 DIAGNOSIS — F411 Generalized anxiety disorder: Secondary | ICD-10-CM | POA: Diagnosis not present

## 2021-02-22 DIAGNOSIS — Z8659 Personal history of other mental and behavioral disorders: Secondary | ICD-10-CM | POA: Diagnosis not present

## 2021-02-23 DIAGNOSIS — F332 Major depressive disorder, recurrent severe without psychotic features: Secondary | ICD-10-CM | POA: Diagnosis not present

## 2021-02-24 DIAGNOSIS — F411 Generalized anxiety disorder: Secondary | ICD-10-CM | POA: Diagnosis not present

## 2021-02-24 DIAGNOSIS — F413 Other mixed anxiety disorders: Secondary | ICD-10-CM | POA: Diagnosis not present

## 2021-02-24 DIAGNOSIS — Z8659 Personal history of other mental and behavioral disorders: Secondary | ICD-10-CM | POA: Diagnosis not present

## 2021-02-24 DIAGNOSIS — F3341 Major depressive disorder, recurrent, in partial remission: Secondary | ICD-10-CM | POA: Diagnosis not present

## 2021-02-27 DIAGNOSIS — F3341 Major depressive disorder, recurrent, in partial remission: Secondary | ICD-10-CM | POA: Diagnosis not present

## 2021-02-27 DIAGNOSIS — Z8659 Personal history of other mental and behavioral disorders: Secondary | ICD-10-CM | POA: Diagnosis not present

## 2021-02-27 DIAGNOSIS — F411 Generalized anxiety disorder: Secondary | ICD-10-CM | POA: Diagnosis not present

## 2021-02-28 DIAGNOSIS — F413 Other mixed anxiety disorders: Secondary | ICD-10-CM | POA: Diagnosis not present

## 2021-02-28 DIAGNOSIS — F332 Major depressive disorder, recurrent severe without psychotic features: Secondary | ICD-10-CM | POA: Diagnosis not present

## 2021-03-01 DIAGNOSIS — F411 Generalized anxiety disorder: Secondary | ICD-10-CM | POA: Diagnosis not present

## 2021-03-01 DIAGNOSIS — Z8659 Personal history of other mental and behavioral disorders: Secondary | ICD-10-CM | POA: Diagnosis not present

## 2021-03-01 DIAGNOSIS — F3341 Major depressive disorder, recurrent, in partial remission: Secondary | ICD-10-CM | POA: Diagnosis not present

## 2021-03-02 DIAGNOSIS — F413 Other mixed anxiety disorders: Secondary | ICD-10-CM | POA: Diagnosis not present

## 2021-03-06 DIAGNOSIS — Z8659 Personal history of other mental and behavioral disorders: Secondary | ICD-10-CM | POA: Diagnosis not present

## 2021-03-06 DIAGNOSIS — F411 Generalized anxiety disorder: Secondary | ICD-10-CM | POA: Diagnosis not present

## 2021-03-06 DIAGNOSIS — F3341 Major depressive disorder, recurrent, in partial remission: Secondary | ICD-10-CM | POA: Diagnosis not present

## 2021-03-07 DIAGNOSIS — F332 Major depressive disorder, recurrent severe without psychotic features: Secondary | ICD-10-CM | POA: Diagnosis not present

## 2021-03-07 DIAGNOSIS — F413 Other mixed anxiety disorders: Secondary | ICD-10-CM | POA: Diagnosis not present

## 2021-03-08 DIAGNOSIS — F411 Generalized anxiety disorder: Secondary | ICD-10-CM | POA: Diagnosis not present

## 2021-03-08 DIAGNOSIS — Z8659 Personal history of other mental and behavioral disorders: Secondary | ICD-10-CM | POA: Diagnosis not present

## 2021-03-08 DIAGNOSIS — F3341 Major depressive disorder, recurrent, in partial remission: Secondary | ICD-10-CM | POA: Diagnosis not present

## 2021-03-09 DIAGNOSIS — F413 Other mixed anxiety disorders: Secondary | ICD-10-CM | POA: Diagnosis not present

## 2021-03-10 DIAGNOSIS — Z8659 Personal history of other mental and behavioral disorders: Secondary | ICD-10-CM | POA: Diagnosis not present

## 2021-03-10 DIAGNOSIS — F3341 Major depressive disorder, recurrent, in partial remission: Secondary | ICD-10-CM | POA: Diagnosis not present

## 2021-03-10 DIAGNOSIS — F411 Generalized anxiety disorder: Secondary | ICD-10-CM | POA: Diagnosis not present

## 2021-03-13 DIAGNOSIS — F3341 Major depressive disorder, recurrent, in partial remission: Secondary | ICD-10-CM | POA: Diagnosis not present

## 2021-03-13 DIAGNOSIS — F411 Generalized anxiety disorder: Secondary | ICD-10-CM | POA: Diagnosis not present

## 2021-03-13 DIAGNOSIS — Z8659 Personal history of other mental and behavioral disorders: Secondary | ICD-10-CM | POA: Diagnosis not present

## 2021-03-14 DIAGNOSIS — F332 Major depressive disorder, recurrent severe without psychotic features: Secondary | ICD-10-CM | POA: Diagnosis not present

## 2021-03-14 DIAGNOSIS — F413 Other mixed anxiety disorders: Secondary | ICD-10-CM | POA: Diagnosis not present

## 2021-03-16 DIAGNOSIS — F332 Major depressive disorder, recurrent severe without psychotic features: Secondary | ICD-10-CM | POA: Diagnosis not present

## 2021-03-17 DIAGNOSIS — F413 Other mixed anxiety disorders: Secondary | ICD-10-CM | POA: Diagnosis not present

## 2021-03-20 DIAGNOSIS — F3341 Major depressive disorder, recurrent, in partial remission: Secondary | ICD-10-CM | POA: Diagnosis not present

## 2021-03-20 DIAGNOSIS — Z8659 Personal history of other mental and behavioral disorders: Secondary | ICD-10-CM | POA: Diagnosis not present

## 2021-03-20 DIAGNOSIS — F411 Generalized anxiety disorder: Secondary | ICD-10-CM | POA: Diagnosis not present

## 2021-03-20 DIAGNOSIS — F413 Other mixed anxiety disorders: Secondary | ICD-10-CM | POA: Diagnosis not present

## 2021-03-21 DIAGNOSIS — F332 Major depressive disorder, recurrent severe without psychotic features: Secondary | ICD-10-CM | POA: Diagnosis not present

## 2021-03-24 DIAGNOSIS — F3341 Major depressive disorder, recurrent, in partial remission: Secondary | ICD-10-CM | POA: Diagnosis not present

## 2021-03-24 DIAGNOSIS — F411 Generalized anxiety disorder: Secondary | ICD-10-CM | POA: Diagnosis not present

## 2021-03-24 DIAGNOSIS — F413 Other mixed anxiety disorders: Secondary | ICD-10-CM | POA: Diagnosis not present

## 2021-03-24 DIAGNOSIS — Z8659 Personal history of other mental and behavioral disorders: Secondary | ICD-10-CM | POA: Diagnosis not present

## 2021-03-27 DIAGNOSIS — Z8659 Personal history of other mental and behavioral disorders: Secondary | ICD-10-CM | POA: Diagnosis not present

## 2021-03-27 DIAGNOSIS — F3341 Major depressive disorder, recurrent, in partial remission: Secondary | ICD-10-CM | POA: Diagnosis not present

## 2021-03-27 DIAGNOSIS — F411 Generalized anxiety disorder: Secondary | ICD-10-CM | POA: Diagnosis not present

## 2021-03-28 DIAGNOSIS — F413 Other mixed anxiety disorders: Secondary | ICD-10-CM | POA: Diagnosis not present

## 2021-04-18 DIAGNOSIS — F413 Other mixed anxiety disorders: Secondary | ICD-10-CM | POA: Diagnosis not present

## 2021-04-26 DIAGNOSIS — F413 Other mixed anxiety disorders: Secondary | ICD-10-CM | POA: Diagnosis not present

## 2021-04-27 DIAGNOSIS — F332 Major depressive disorder, recurrent severe without psychotic features: Secondary | ICD-10-CM | POA: Diagnosis not present

## 2021-04-30 DIAGNOSIS — F413 Other mixed anxiety disorders: Secondary | ICD-10-CM | POA: Diagnosis not present

## 2021-05-02 DIAGNOSIS — F331 Major depressive disorder, recurrent, moderate: Secondary | ICD-10-CM | POA: Diagnosis not present

## 2021-05-04 DIAGNOSIS — F332 Major depressive disorder, recurrent severe without psychotic features: Secondary | ICD-10-CM | POA: Diagnosis not present

## 2021-05-05 DIAGNOSIS — F413 Other mixed anxiety disorders: Secondary | ICD-10-CM | POA: Diagnosis not present

## 2021-05-08 ENCOUNTER — Telehealth: Payer: Self-pay | Admitting: Family Medicine

## 2021-05-08 DIAGNOSIS — F413 Other mixed anxiety disorders: Secondary | ICD-10-CM | POA: Diagnosis not present

## 2021-05-08 NOTE — Telephone Encounter (Signed)
Pt dad called wanting to know if it was possible to get a referral for a Nutritionist. [phone]. Please advise.

## 2021-05-09 DIAGNOSIS — F332 Major depressive disorder, recurrent severe without psychotic features: Secondary | ICD-10-CM | POA: Diagnosis not present

## 2021-05-11 DIAGNOSIS — F332 Major depressive disorder, recurrent severe without psychotic features: Secondary | ICD-10-CM | POA: Diagnosis not present

## 2021-05-15 DIAGNOSIS — F413 Other mixed anxiety disorders: Secondary | ICD-10-CM | POA: Diagnosis not present

## 2021-05-16 DIAGNOSIS — F332 Major depressive disorder, recurrent severe without psychotic features: Secondary | ICD-10-CM | POA: Diagnosis not present

## 2021-05-18 ENCOUNTER — Other Ambulatory Visit: Payer: Self-pay | Admitting: Family Medicine

## 2021-05-18 DIAGNOSIS — E785 Hyperlipidemia, unspecified: Secondary | ICD-10-CM

## 2021-05-18 DIAGNOSIS — F332 Major depressive disorder, recurrent severe without psychotic features: Secondary | ICD-10-CM | POA: Diagnosis not present

## 2021-05-19 DIAGNOSIS — F413 Other mixed anxiety disorders: Secondary | ICD-10-CM | POA: Diagnosis not present

## 2021-05-22 DIAGNOSIS — F413 Other mixed anxiety disorders: Secondary | ICD-10-CM | POA: Diagnosis not present

## 2021-05-23 DIAGNOSIS — F332 Major depressive disorder, recurrent severe without psychotic features: Secondary | ICD-10-CM | POA: Diagnosis not present

## 2021-05-25 DIAGNOSIS — F332 Major depressive disorder, recurrent severe without psychotic features: Secondary | ICD-10-CM | POA: Diagnosis not present

## 2021-05-26 DIAGNOSIS — F413 Other mixed anxiety disorders: Secondary | ICD-10-CM | POA: Diagnosis not present

## 2021-05-30 DIAGNOSIS — F332 Major depressive disorder, recurrent severe without psychotic features: Secondary | ICD-10-CM | POA: Diagnosis not present

## 2021-06-02 DIAGNOSIS — F332 Major depressive disorder, recurrent severe without psychotic features: Secondary | ICD-10-CM | POA: Diagnosis not present

## 2021-06-06 DIAGNOSIS — F332 Major depressive disorder, recurrent severe without psychotic features: Secondary | ICD-10-CM | POA: Diagnosis not present

## 2021-06-08 ENCOUNTER — Encounter: Payer: Self-pay | Admitting: Dietician

## 2021-06-08 ENCOUNTER — Other Ambulatory Visit: Payer: Self-pay

## 2021-06-08 ENCOUNTER — Encounter: Payer: BC Managed Care – PPO | Attending: Family Medicine | Admitting: Dietician

## 2021-06-08 VITALS — Ht 70.0 in | Wt 233.1 lb

## 2021-06-08 DIAGNOSIS — E669 Obesity, unspecified: Secondary | ICD-10-CM | POA: Diagnosis not present

## 2021-06-08 NOTE — Progress Notes (Signed)
Medical Nutrition Therapy  Appointment Start time:  70  Appointment End time:  1810  Primary concerns today: GI issues, fatigue  Referral diagnosis: E66.01 - Morbid Obesity Preferred learning style: No preference indicated) Learning readiness: Ready   NUTRITION ASSESSMENT   Anthropometrics  Ht: 5'10" Wt: 233.1 lbs Body mass index is 33.45 kg/m.    Clinical Medical Hx: Hypothyroidism, HLD Medications: Rosuvastatin, Lithium, Vilazodone Labs: N/A Notable Signs/Symptoms: Chronic fatigue, dizziness, dry mouth  Lifestyle & Dietary Hx  Pt reports history of depression and anxiety that has led to GI symptoms, mostly diarrhea. Pt would like to work on minimizing these symptoms. Diarrhea has increased since starting Vilazodone. Pt reports symptoms aren't as bad when Vilazodone taking on a full stomach, but are still persistent. Pt has taken Immodium in the past with success, does not want to take it regularly. Pt reports the last few months have been high stress and resulted in inconsistent appetite and weight loss of about 20 pounds.  Pt reports feeling fatigue and lightheadedness more consistently since frequency of diarrhea has increased. Pt states it mostly during happens midday. Pt is scheduled for an in-home sleep study due to being suspected of having OSA, pt states he has been observed stopping breathing and then gasping for air while sleeping. Pt reports drinking a lot of water, and soda. Pt reports over consuming dipping sauces is their biggest vice. Pt reports missing breakfast usually, eats the most at lunch time, and usually has dinner.  Body Composition Scale Date 06/08/2021  Current Body Weight 231.3 lbs  Total Body Fat % 27.9  Visceral Fat 16  Fat-Free Mass % 72.0   Total Body Water % 53  Muscle-Mass lbs 43.5  BMI 33.1  Body Fat Displacement          Torso  lbs 40         Left Leg  lbs 8         Right Leg  lbs 8         Left Arm  lbs 4         Right Arm   lbs 4      Estimated daily fluid intake: +128 oz Supplements: Vitamin D Sleep: Improved with Ambien, difficulty falling asleep. In home sleep study consult is scheduled. Stress / self-care: High, GAD/MDD Current average weekly physical activity: ADLs, walking   24-Hr Dietary Recall First Meal: Bagel with cream cheese Snack: none Second Meal: [employer] sandwich, fries, soda Snack: none Third Meal: none Snack: none Beverages: water   NUTRITION DIAGNOSIS  NB-1.1 Food and nutrition-related knowledge deficit As related to obesity.  As evidenced by BMI of 33.45 kg/m2, inconsistent eating patterns, and self reported frequent consumption of SSB.   NUTRITION INTERVENTION  Nutrition education (E-1) on the following topics:  Educated pt on the symptoms of dehydration including dry mouth, low energy, and lightheaded/dizziness. Educated pt on proper rehydration techniques using low-sugar sports drinks with electrolytes, or powdered hydration supplements. Educated pt on increasing soluble fiber intake to help with diarrhea. Recommend pt begin each day with a balanced breakfast of whole grains and protein. Educated pt on the symptoms of OSA, including profound midday fatigue.  Handouts Provided Include  Body Composition Scan Report  Learning Style & Readiness for Change Teaching method utilized: Visual & Auditory  Demonstrated degree of understanding via: Teach Back  Barriers to learning/adherence to lifestyle change: None  Goals Established by Pt Talk to your psychiatrist about the side effects you are experiencing  since starting Vilazodone. Drink one G2 Gatorade each day, or have 1-2 packs of Liquid IV or a rehydration supplement with 16 oz of water.  Start with 2-3 rehydration beverages a day. Have an additional 64 oz of plain water each day Start a daily Men's multivitamin, can be a pill or gummies. Start your day with a bowl of oatmeal with 1/2 banana, and a spoonful of peanut  butter.   MONITORING & EVALUATION Dietary intake, weekly physical activity, and dehydration symptoms in 6 weeks.  Next Steps  Patient is to contact psychiatrist about medication side effects, follow up with RD.

## 2021-06-08 NOTE — Patient Instructions (Addendum)
Talk to your psychiatrist about the side effects you are experiencing since starting Vilazodone.  Drink one G2 Gatorade each day, or have 1-2 packs of Liquid IV or a rehydration supplement with 16 oz of water.  Start with 2-3 rehydration beverages a day.  Have an additional 64 oz of plain water each day  Start a daily Men's multivitamin, can be a pill or gummies.  Start your day with a bowl of oatmeal with 1/2 banana, and a spoonful of peanut butter.

## 2021-06-09 DIAGNOSIS — F413 Other mixed anxiety disorders: Secondary | ICD-10-CM | POA: Diagnosis not present

## 2021-06-11 NOTE — Progress Notes (Deleted)
[place] at [facility] [address] [place], [place] [address] [phone]  Date:  06/14/2021   Name:  Alexander Harrington   DOB:  09-01-1990   MRN:  [id number]  PCP:  Darreld Mclean, MD    Chief Complaint: No chief complaint on file.   History of Present Illness:  Alexander Harrington is a 30 y.o. very pleasant male patient who presents with the following:  Pt seen today with concern of stomach issues Last visit with myself was a virtual visit in January   He was a no show with me in May History of mood disorder, hyperlipidemia, hypothyroidism  Covid booster Flu vaccine  Lab Results  Component Value Date   TSH 1.12 06/08/2020     Patient Active Problem List   Diagnosis Date Noted   Hyperlipidemia 06/03/2020   Bipolar I disorder, most recent episode depressed ([place]) 08/07/2017    Class: Chronic   Hoarseness of voice 10/04/2015   Tachycardia 08/30/2015   Leukocytosis 02/08/2015   Other specified hypothyroidism 02/08/2015   MDD (major depressive disorder), recurrent episode, severe ([place]) 12/01/2013   Substance induced mood disorder ([place]) 04/11/2012    Class: Acute   Severe episode of recurrent major depressive disorder ([place]) 04/09/2012    Class: Acute   Keratosis pilaris 02/21/2011    Past Medical History:  Diagnosis Date   Anxiety    Dr. Toy Care   Bipolar 2 disorder [facility])    Depression     No past surgical history on file.  Social History   Tobacco Use   Smoking status: Never   Smokeless tobacco: Never  Vaping Use   Vaping Use: Never used  Substance Use Topics   Alcohol use: Yes    Alcohol/week: 5.0 - 6.0 standard drinks    Types: 5 - 6 Cans of beer per week    Comment: once a week or every other week   Drug use: Yes    Frequency: 1.0 times per week    Types: Marijuana    Comment: Occasional use    Family History  Problem Relation Age of Onset   Arthritis Mother    Mental illness Mother     Depression Mother    Anxiety disorder Mother    Drug abuse Maternal Grandfather    Alcohol abuse Maternal Grandfather    Drug abuse Maternal Grandmother    Alcohol abuse Maternal Grandmother     Allergies  Allergen Reactions   Amphetamines Other (See Comments)    Caused depression that went away after stopping the amphetamine   Benzodiazepines Other (See Comments)    Got cloudy thinking, loss of memory, suicidal thinking on Klonopin.   Codeine Rash   Crestor [Rosuvastatin] Other (See Comments)    Myalgia    Medication list has been reviewed and updated.  Current Outpatient Medications on File Prior to Visit  Medication Sig Dispense Refill   busPIRone (BUSPAR) 10 MG tablet Take 10 mg by mouth 2 (two) times daily.     clonazePAM (KLONOPIN) 1 MG tablet Take 1 mg by mouth 3 (three) times daily as needed.     doxycycline (VIBRAMYCIN) 100 MG capsule Take 1 capsule (100 mg total) by mouth 2 (two) times daily. (Patient not taking: Reported on 06/08/2021) 20 capsule 0   lithium carbonate (ESKALITH) 450 MG CR tablet Take 450 mg by mouth at bedtime.     omeprazole (PRILOSEC) 40 MG capsule Take 1 capsule (40  mg total) by mouth daily. Take for 4-8 weeks and then reassess (Patient not taking: Reported on 06/08/2021) 30 capsule 3   propranolol (INDERAL) 20 MG tablet Take 20 mg by mouth 3 (three) times daily.     QUEtiapine (SEROQUEL) 300 MG tablet Take 300 mg by mouth at bedtime. (Patient not taking: Reported on 06/08/2021)     rosuvastatin (CRESTOR) 5 MG tablet TAKE 1 TABLET(5 MG) BY MOUTH DAILY 90 tablet 3   traZODone (DESYREL) 50 MG tablet Take 50-150 mg by mouth at bedtime as needed. (Patient not taking: Reported on 06/08/2021)     TRINTELLIX 10 MG TABS tablet Take 10 mg by mouth daily. (Patient not taking: Reported on 06/08/2021)     Vilazodone HCl (VIIBRYD) 10 MG TABS SMARTSIG:Tablet(s) By Mouth     zolpidem (AMBIEN) 10 MG tablet Take 10 mg by mouth at bedtime.     No current  facility-administered medications on file prior to visit.    Review of Systems:  As per HPI- otherwise negative.   Physical Examination: There were no vitals filed for this visit. There were no vitals filed for this visit. There is no height or weight on file to calculate BMI. Ideal Body Weight:    GEN: no acute distress. HEENT: Atraumatic, Normocephalic.  Ears and Nose: No external deformity. CV: RRR, No M/G/R. No JVD. No thrill. No extra heart sounds. PULM: CTA B, no wheezes, crackles, rhonchi. No retractions. No resp. distress. No accessory muscle use. ABD: S, NT, ND, +BS. No rebound. No HSM. EXTR: No c/c/e PSYCH: Normally interactive. Conversant.    Assessment and Plan: ***  Signed Lamar Blinks, MD

## 2021-06-12 DIAGNOSIS — F413 Other mixed anxiety disorders: Secondary | ICD-10-CM | POA: Diagnosis not present

## 2021-06-13 DIAGNOSIS — F331 Major depressive disorder, recurrent, moderate: Secondary | ICD-10-CM | POA: Diagnosis not present

## 2021-06-14 ENCOUNTER — Ambulatory Visit: Payer: BC Managed Care – PPO | Admitting: Family Medicine

## 2021-06-14 DIAGNOSIS — Z130 Encounter for screening for diseases of the blood and blood-forming organs and certain disorders involving the immune mechanism: Secondary | ICD-10-CM

## 2021-06-14 DIAGNOSIS — R000 Tachycardia, unspecified: Secondary | ICD-10-CM

## 2021-06-14 DIAGNOSIS — E785 Hyperlipidemia, unspecified: Secondary | ICD-10-CM

## 2021-06-14 DIAGNOSIS — Z131 Encounter for screening for diabetes mellitus: Secondary | ICD-10-CM

## 2021-06-14 DIAGNOSIS — E559 Vitamin D deficiency, unspecified: Secondary | ICD-10-CM

## 2021-06-14 NOTE — Progress Notes (Deleted)
[place] at [facility] [address] [place], [place] [address] [phone]  Date:  06/21/2021   Name:  Alexander Harrington   DOB:  [birth year]   MRN:  [id number]  PCP:  Darreld Mclean, MD    Chief Complaint: No chief complaint on file.   History of Present Illness:  Alexander Harrington is a 30 y.o. very pleasant male patient who presents with the following:  Pt seen today with concern of stomach problems History of overweight, dyslipidemia, mood disorder Last visit with myself virtually in January of this year   Covid booster Flu vaccine   Patient Active Problem List   Diagnosis Date Noted   Hyperlipidemia 06/03/2020   Bipolar I disorder, most recent episode depressed ([place]) 08/07/2017    Class: Chronic   Hoarseness of voice 10/04/2015   Tachycardia 08/30/2015   Leukocytosis 02/08/2015   Other specified hypothyroidism 02/08/2015   MDD (major depressive disorder), recurrent episode, severe ([place]) 12/01/2013   Substance induced mood disorder ([place]) 04/11/2012    Class: Acute   Severe episode of recurrent major depressive disorder ([place]) 04/09/2012    Class: Acute   Keratosis pilaris 02/21/2011    Past Medical History:  Diagnosis Date   Anxiety    Dr. Toy Care   Bipolar 2 disorder [facility])    Depression     No past surgical history on file.  Social History   Tobacco Use   Smoking status: Never   Smokeless tobacco: Never  Vaping Use   Vaping Use: Never used  Substance Use Topics   Alcohol use: Yes    Alcohol/week: 5.0 - 6.0 standard drinks    Types: 5 - 6 Cans of beer per week    Comment: once a week or every other week   Drug use: Yes    Frequency: 1.0 times per week    Types: Marijuana    Comment: Occasional use    Family History  Problem Relation Age of Onset   Arthritis Mother    Mental illness Mother    Depression Mother    Anxiety disorder Mother    Drug abuse Maternal Grandfather    Alcohol abuse  Maternal Grandfather    Drug abuse Maternal Grandmother    Alcohol abuse Maternal Grandmother     Allergies  Allergen Reactions   Amphetamines Other (See Comments)    Caused depression that went away after stopping the amphetamine   Benzodiazepines Other (See Comments)    Got cloudy thinking, loss of memory, suicidal thinking on Klonopin.   Codeine Rash   Crestor [Rosuvastatin] Other (See Comments)    Myalgia    Medication list has been reviewed and updated.  Current Outpatient Medications on File Prior to Visit  Medication Sig Dispense Refill   busPIRone (BUSPAR) 10 MG tablet Take 10 mg by mouth 2 (two) times daily.     clonazePAM (KLONOPIN) 1 MG tablet Take 1 mg by mouth 3 (three) times daily as needed.     doxycycline (VIBRAMYCIN) 100 MG capsule Take 1 capsule (100 mg total) by mouth 2 (two) times daily. (Patient not taking: Reported on 06/08/2021) 20 capsule 0   lithium carbonate (ESKALITH) 450 MG CR tablet Take 450 mg by mouth at bedtime.     omeprazole (PRILOSEC) 40 MG capsule Take 1 capsule (40 mg total) by mouth daily. Take for 4-8 weeks and then reassess (Patient not taking: Reported on 06/08/2021) 30 capsule 3   propranolol (  INDERAL) 20 MG tablet Take 20 mg by mouth 3 (three) times daily.     QUEtiapine (SEROQUEL) 300 MG tablet Take 300 mg by mouth at bedtime. (Patient not taking: Reported on 06/08/2021)     rosuvastatin (CRESTOR) 5 MG tablet TAKE 1 TABLET(5 MG) BY MOUTH DAILY 90 tablet 3   traZODone (DESYREL) 50 MG tablet Take 50-150 mg by mouth at bedtime as needed. (Patient not taking: Reported on 06/08/2021)     TRINTELLIX 10 MG TABS tablet Take 10 mg by mouth daily. (Patient not taking: Reported on 06/08/2021)     Vilazodone HCl (VIIBRYD) 10 MG TABS SMARTSIG:Tablet(s) By Mouth     zolpidem (AMBIEN) 10 MG tablet Take 10 mg by mouth at bedtime.     No current facility-administered medications on file prior to visit.    Review of Systems:  As per HPI- otherwise  negative.  Physical Examination: There were no vitals filed for this visit. There were no vitals filed for this visit. There is no height or weight on file to calculate BMI. Ideal Body Weight:    GEN: no acute distress. HEENT: Atraumatic, Normocephalic.  Ears and Nose: No external deformity. CV: RRR, No M/G/R. No JVD. No thrill. No extra heart sounds. PULM: CTA B, no wheezes, crackles, rhonchi. No retractions. No resp. distress. No accessory muscle use. ABD: S, NT, ND, +BS. No rebound. No HSM. EXTR: No c/c/e PSYCH: Normally interactive. Conversant.    Assessment and Plan: ***  Signed Lamar Blinks, MD

## 2021-06-20 DIAGNOSIS — F331 Major depressive disorder, recurrent, moderate: Secondary | ICD-10-CM | POA: Diagnosis not present

## 2021-06-21 ENCOUNTER — Ambulatory Visit: Payer: BC Managed Care – PPO | Admitting: Family Medicine

## 2021-06-21 DIAGNOSIS — F413 Other mixed anxiety disorders: Secondary | ICD-10-CM | POA: Diagnosis not present

## 2021-06-23 DIAGNOSIS — F413 Other mixed anxiety disorders: Secondary | ICD-10-CM | POA: Diagnosis not present

## 2021-06-26 ENCOUNTER — Encounter: Payer: Self-pay | Admitting: Family Medicine

## 2021-06-26 DIAGNOSIS — F413 Other mixed anxiety disorders: Secondary | ICD-10-CM | POA: Diagnosis not present

## 2021-06-27 DIAGNOSIS — F331 Major depressive disorder, recurrent, moderate: Secondary | ICD-10-CM | POA: Diagnosis not present

## 2021-06-29 DIAGNOSIS — F413 Other mixed anxiety disorders: Secondary | ICD-10-CM | POA: Diagnosis not present

## 2021-06-30 DIAGNOSIS — F332 Major depressive disorder, recurrent severe without psychotic features: Secondary | ICD-10-CM | POA: Diagnosis not present

## 2021-07-03 DIAGNOSIS — F413 Other mixed anxiety disorders: Secondary | ICD-10-CM | POA: Diagnosis not present

## 2021-07-04 DIAGNOSIS — G4700 Insomnia, unspecified: Secondary | ICD-10-CM | POA: Diagnosis not present

## 2021-07-04 DIAGNOSIS — Z9189 Other specified personal risk factors, not elsewhere classified: Secondary | ICD-10-CM | POA: Diagnosis not present

## 2021-07-04 DIAGNOSIS — Z72821 Inadequate sleep hygiene: Secondary | ICD-10-CM | POA: Diagnosis not present

## 2021-07-04 DIAGNOSIS — G4733 Obstructive sleep apnea (adult) (pediatric): Secondary | ICD-10-CM | POA: Diagnosis not present

## 2021-07-04 DIAGNOSIS — G478 Other sleep disorders: Secondary | ICD-10-CM | POA: Diagnosis not present

## 2021-07-04 DIAGNOSIS — F331 Major depressive disorder, recurrent, moderate: Secondary | ICD-10-CM | POA: Diagnosis not present

## 2021-07-04 DIAGNOSIS — R400 Somnolence: Secondary | ICD-10-CM | POA: Diagnosis not present

## 2021-07-04 DIAGNOSIS — R682 Dry mouth, unspecified: Secondary | ICD-10-CM | POA: Diagnosis not present

## 2021-07-04 DIAGNOSIS — R0683 Snoring: Secondary | ICD-10-CM | POA: Diagnosis not present

## 2021-07-04 DIAGNOSIS — R5383 Other fatigue: Secondary | ICD-10-CM | POA: Diagnosis not present

## 2021-07-06 DIAGNOSIS — F332 Major depressive disorder, recurrent severe without psychotic features: Secondary | ICD-10-CM | POA: Diagnosis not present

## 2021-07-11 DIAGNOSIS — F413 Other mixed anxiety disorders: Secondary | ICD-10-CM | POA: Diagnosis not present

## 2021-07-12 DIAGNOSIS — F332 Major depressive disorder, recurrent severe without psychotic features: Secondary | ICD-10-CM | POA: Diagnosis not present

## 2021-07-14 NOTE — Progress Notes (Deleted)
[place] at [facility] [address], [place], [place] [address] [phone]  Date:  07/20/2021   Name:  Alexander Harrington   DOB:  01/25/1991   MRN:  [id number]  PCP:  Darreld Mclean, MD    Chief Complaint: No chief complaint on file.   History of Present Illness:  Alexander Harrington is a 30 y.o. very pleasant male patient who presents with the following:  Pt seen today with concern of stomach upset   Patient Active Problem List   Diagnosis Date Noted   Hyperlipidemia 06/03/2020   Bipolar I disorder, most recent episode depressed ([place]) 08/07/2017    Class: Chronic   Hoarseness of voice 10/04/2015   Tachycardia 08/30/2015   Leukocytosis 02/08/2015   Other specified hypothyroidism 02/08/2015   MDD (major depressive disorder), recurrent episode, severe ([place]) 12/01/2013   Substance induced mood disorder ([place]) 04/11/2012    Class: Acute   Severe episode of recurrent major depressive disorder ([place]) 04/09/2012    Class: Acute   Keratosis pilaris 02/21/2011    Past Medical History:  Diagnosis Date   Anxiety    Dr. Toy Care   Bipolar 2 disorder [facility])    Depression     No past surgical history on file.  Social History   Tobacco Use   Smoking status: Never   Smokeless tobacco: Never  Vaping Use   Vaping Use: Never used  Substance Use Topics   Alcohol use: Yes    Alcohol/week: 5.0 - 6.0 standard drinks    Types: 5 - 6 Cans of beer per week    Comment: once a week or every other week   Drug use: Yes    Frequency: 1.0 times per week    Types: Marijuana    Comment: Occasional use    Family History  Problem Relation Age of Onset   Arthritis Mother    Mental illness Mother    Depression Mother    Anxiety disorder Mother    Drug abuse Maternal Grandfather    Alcohol abuse Maternal Grandfather    Drug abuse Maternal Grandmother    Alcohol abuse Maternal Grandmother     Allergies  Allergen Reactions   Amphetamines  Other (See Comments)    Caused depression that went away after stopping the amphetamine   Benzodiazepines Other (See Comments)    Got cloudy thinking, loss of memory, suicidal thinking on Klonopin.   Codeine Rash   Crestor [Rosuvastatin] Other (See Comments)    Myalgia    Medication list has been reviewed and updated.  Current Outpatient Medications on File Prior to Visit  Medication Sig Dispense Refill   busPIRone (BUSPAR) 10 MG tablet Take 10 mg by mouth 2 (two) times daily.     clonazePAM (KLONOPIN) 1 MG tablet Take 1 mg by mouth 3 (three) times daily as needed.     doxycycline (VIBRAMYCIN) 100 MG capsule Take 1 capsule (100 mg total) by mouth 2 (two) times daily. (Patient not taking: Reported on 06/08/2021) 20 capsule 0   lithium carbonate (ESKALITH) 450 MG CR tablet Take 450 mg by mouth at bedtime.     omeprazole (PRILOSEC) 40 MG capsule Take 1 capsule (40 mg total) by mouth daily. Take for 4-8 weeks and then reassess (Patient not taking: Reported on 06/08/2021) 30 capsule 3   propranolol (INDERAL) 20 MG tablet Take 20 mg by mouth 3 (three) times daily.     QUEtiapine (SEROQUEL) 300 MG tablet  Take 300 mg by mouth at bedtime. (Patient not taking: Reported on 06/08/2021)     rosuvastatin (CRESTOR) 5 MG tablet TAKE 1 TABLET(5 MG) BY MOUTH DAILY 90 tablet 3   traZODone (DESYREL) 50 MG tablet Take 50-150 mg by mouth at bedtime as needed. (Patient not taking: Reported on 06/08/2021)     TRINTELLIX 10 MG TABS tablet Take 10 mg by mouth daily. (Patient not taking: Reported on 06/08/2021)     Vilazodone HCl (VIIBRYD) 10 MG TABS SMARTSIG:Tablet(s) By Mouth     zolpidem (AMBIEN) 10 MG tablet Take 10 mg by mouth at bedtime.     No current facility-administered medications on file prior to visit.    Review of Systems:  As per HPI- otherwise negative.   Physical Examination: There were no vitals filed for this visit. There were no vitals filed for this visit. There is no height or weight  on file to calculate BMI. Ideal Body Weight:    GEN: no acute distress. HEENT: Atraumatic, Normocephalic.  Ears and Nose: No external deformity. CV: RRR, No M/G/R. No JVD. No thrill. No extra heart sounds. PULM: CTA B, no wheezes, crackles, rhonchi. No retractions. No resp. distress. No accessory muscle use. ABD: S, NT, ND, +BS. No rebound. No HSM. EXTR: No c/c/e PSYCH: Normally interactive. Conversant.    Assessment and Plan: ***  Signed Lamar Blinks, MD

## 2021-07-18 DIAGNOSIS — F413 Other mixed anxiety disorders: Secondary | ICD-10-CM | POA: Diagnosis not present

## 2021-07-19 DIAGNOSIS — F413 Other mixed anxiety disorders: Secondary | ICD-10-CM | POA: Diagnosis not present

## 2021-07-19 DIAGNOSIS — G4733 Obstructive sleep apnea (adult) (pediatric): Secondary | ICD-10-CM | POA: Diagnosis not present

## 2021-07-20 ENCOUNTER — Ambulatory Visit: Payer: BC Managed Care – PPO | Admitting: Family Medicine

## 2021-07-20 ENCOUNTER — Ambulatory Visit: Payer: BC Managed Care – PPO | Admitting: Dietician

## 2021-07-21 DIAGNOSIS — F332 Major depressive disorder, recurrent severe without psychotic features: Secondary | ICD-10-CM | POA: Diagnosis not present

## 2021-07-25 DIAGNOSIS — F332 Major depressive disorder, recurrent severe without psychotic features: Secondary | ICD-10-CM | POA: Diagnosis not present

## 2021-07-25 DIAGNOSIS — F413 Other mixed anxiety disorders: Secondary | ICD-10-CM | POA: Diagnosis not present

## 2021-07-25 NOTE — Progress Notes (Addendum)
[place] at [facility] [address], [place], [place] [address] [phone]  Date:  07/26/2021   Name:  Alexander Harrington   DOB:  1991/03/06   MRN:  [id number]  PCP:  Darreld Mclean, MD    Chief Complaint: Labs Only (Pt requests labs for fatigue, Vitamin D, and Lithium. He was recently Dx with OSA and will start using a cpap machine but would like labs drawn as well. )   History of Present Illness:  Alexander Harrington is a 31 y.o. very pleasant male patient who presents with the following:  Patient with history of mood disorder, hypothyroidism and hyperlipidemia, sleep apnea.  Here today with concern of stomach problems  Most recent visit with myself was a virtual visit about 1 year ago  COVID-19 booster-recommended Flu shot- give today  Most recent labs about 1 year ago  Today pt notes he is feeling pretty tired Over this past summer he was in residential treatment for depression.  He is now home, but continues to feel tired and stressed-he notes he started a romantic relationship with another patient while in treatment, this did not work out which left him heartbroken He has lost some weight-admits he is not eating as much as he should person due to feeling heartbroken He was recently dx with sleep apnea and just needs to pick up his CPAP machine- he is waiting for this to happen  He is taking lithium per his psychiatrist Dr. Toy Care and needs a level today  History of vitamin D def  He is trying to stick with a routine/plan every day and knows he should get more exercise-I encouraged him to keep working on this  Lab Results  Component Value Date   TSH 1.12 06/08/2020   Wt Readings from Last 3 Encounters:  07/26/21 227 lb 3.2 oz (103.1 kg)  06/08/21 233 lb 1.6 oz (105.7 kg)  06/27/20 252 lb 3.2 oz (114.4 kg)     Patient Active Problem List   Diagnosis Date Noted   Hyperlipidemia 06/03/2020   Bipolar I disorder, most recent  episode depressed ([place]) 08/07/2017    Class: Chronic   Hoarseness of voice 10/04/2015   Tachycardia 08/30/2015   Leukocytosis 02/08/2015   Other specified hypothyroidism 02/08/2015   MDD (major depressive disorder), recurrent episode, severe ([place]) 12/01/2013   Substance induced mood disorder ([place]) 04/11/2012    Class: Acute   Severe episode of recurrent major depressive disorder ([place]) 04/09/2012    Class: Acute   Keratosis pilaris 02/21/2011    Past Medical History:  Diagnosis Date   Anxiety    Dr. Toy Care   Bipolar 2 disorder [facility])    Depression     No past surgical history on file.  Social History   Tobacco Use   Smoking status: Never   Smokeless tobacco: Never  Vaping Use   Vaping Use: Never used  Substance Use Topics   Alcohol use: Yes    Alcohol/week: 5.0 - 6.0 standard drinks    Types: 5 - 6 Cans of beer per week    Comment: once a week or every other week   Drug use: Yes    Frequency: 1.0 times per week    Types: Marijuana    Comment: Occasional use    Family History  Problem Relation Age of Onset   Arthritis Mother    Mental illness Mother    Depression Mother    Anxiety disorder  Mother    Drug abuse Maternal Grandfather    Alcohol abuse Maternal Grandfather    Drug abuse Maternal Grandmother    Alcohol abuse Maternal Grandmother     Allergies  Allergen Reactions   Amphetamines Other (See Comments)    Caused depression that went away after stopping the amphetamine   Benzodiazepines Other (See Comments)    Got cloudy thinking, loss of memory, suicidal thinking on Klonopin.   Codeine Rash   Crestor [Rosuvastatin] Other (See Comments)    Myalgia    Medication list has been reviewed and updated.  Current Outpatient Medications on File Prior to Visit  Medication Sig Dispense Refill   busPIRone (BUSPAR) 10 MG tablet Take 10 mg by mouth 2 (two) times daily.     Cholecalciferol (VITAMIN D3) 1.25 MG (50000 UT) CAPS Take by mouth once a week.      clonazePAM (KLONOPIN) 1 MG tablet Take 1 mg by mouth 3 (three) times daily as needed.     Dextromethorphan-Bupropion (AUVELITY PO) Take by mouth.     lithium carbonate (ESKALITH) 450 MG CR tablet Take 450 mg by mouth at bedtime.     omeprazole (PRILOSEC) 40 MG capsule Take 1 capsule (40 mg total) by mouth daily. Take for 4-8 weeks and then reassess 30 capsule 3   propranolol (INDERAL) 20 MG tablet Take 20 mg by mouth 3 (three) times daily.     QUEtiapine (SEROQUEL) 300 MG tablet Take 300 mg by mouth at bedtime.     rosuvastatin (CRESTOR) 5 MG tablet TAKE 1 TABLET(5 MG) BY MOUTH DAILY 90 tablet 3   traZODone (DESYREL) 50 MG tablet Take 50-150 mg by mouth at bedtime as needed.     TRINTELLIX 10 MG TABS tablet Take 10 mg by mouth daily.     zolpidem (AMBIEN) 10 MG tablet Take 10 mg by mouth at bedtime.     No current facility-administered medications on file prior to visit.    Review of Systems:  As per HPI- otherwise negative.   Physical Examination: Vitals:   07/26/21 1422  BP: 122/60  Pulse: 81  Resp: 18  Temp: 98.1 F (36.7 C)  SpO2: 98%   Vitals:   07/26/21 1422  Weight: 227 lb 3.2 oz (103.1 kg)  Height: 5\' 10"  (1.778 m)   Body mass index is 32.6 kg/m. Ideal Body Weight: Weight in (lb) to have BMI = 25: 173.9  GEN: no acute distress.  Appears physically well although he has lost weight Still obese HEENT: Atraumatic, Normocephalic.  Ears and Nose: No external deformity. CV: RRR, No M/G/R. No JVD. No thrill. No extra heart sounds. PULM: CTA B, no wheezes, crackles, rhonchi. No retractions. No resp. distress. No accessory muscle use. ABD: S, NT, ND. No rebound. No HSM. EXTR: No c/c/e PSYCH: Normally interactive. Conversant.    Assessment and Plan: Medication monitoring encounter - Plan: Lithium level  Vitamin D deficiency - Plan: VITAMIN D 25 Hydroxy (Vit-D Deficiency, Fractures)  Hyperlipidemia, unspecified hyperlipidemia type - Plan: Lipid panel  Screening for  diabetes mellitus - Plan: Comprehensive metabolic panel, Hemoglobin A1c  Fatigue due to depression - Plan: TSH, CBC  Need for influenza vaccination - Plan: Flu Vaccine QUAD 6+ mos PF IM (Fluarix Quad PF)  Patient seen today for follow-up.  He needs a lithium level and other labs as above His psychiatrist is managing his mental health care.  Unfortunately Kay[place] does struggle with depression. He is taking Crestor for hyperlipidemia, will check on his  lipids today Gave flu shot Will plan further follow- up pending labs. At this time patient feels that he is safe, he will let me know if any other concerns.  We will plan to check back in 6 months  Signed Lamar Blinks, MD  Addendum 1/5, received labs as below.  Message to patient  Results for orders placed or performed in visit on 07/26/21  VITAMIN D 25 Hydroxy (Vit-D Deficiency, Fractures)  Result Value Ref Range   VITD 34.40 30.00 - 100.00 ng/mL  TSH  Result Value Ref Range   TSH 1.12 0.35 - 5.50 uIU/mL  Lithium level  Result Value Ref Range   Lithium Lvl 0.3 (L) 0.6 - 1.2 mmol/L  CBC  Result Value Ref Range   WBC 14.1 (H) 4.0 - 10.5 K/uL   RBC 5.41 4.22 - 5.81 Mil/uL   Platelets 380.0 150.0 - 400.0 K/uL   Hemoglobin 14.8 13.0 - 17.0 g/dL   HCT 46.4 39.0 - 52.0 %   MCV 85.8 78.0 - 100.0 fl   MCHC 32.0 30.0 - 36.0 g/dL   RDW 12.6 11.5 - 15.5 %  Comprehensive metabolic panel  Result Value Ref Range   Sodium 138 135 - 145 mEq/L   Potassium 4.6 3.5 - 5.1 mEq/L   Chloride 102 96 - 112 mEq/L   CO2 28 19 - 32 mEq/L   Glucose, Bld 77 70 - 99 mg/dL   BUN 12 6 - 23 mg/dL   Creatinine, Ser 1.05 0.40 - 1.50 mg/dL   Total Bilirubin 0.8 0.2 - 1.2 mg/dL   Alkaline Phosphatase 50 39 - 117 U/L   AST 17 0 - 37 U/L   ALT 26 0 - 53 U/L   Total Protein 7.7 6.0 - 8.3 g/dL   Albumin 4.7 3.5 - 5.2 g/dL   GFR 95.18 >60.00 mL/min   Calcium 9.9 8.4 - 10.5 mg/dL  Hemoglobin A1c  Result Value Ref Range   Hgb A1c MFr Bld 5.0 4.6 - 6.5 %   Lipid panel  Result Value Ref Range   Cholesterol 204 (H) 0 - 200 mg/dL   Triglycerides 342.0 (H) 0.0 - 149.0 mg/dL   HDL 36.00 (L) >39.00 mg/dL   VLDL 68.4 (H) 0.0 - 40.0 mg/dL   Total CHOL/HDL Ratio 6    NonHDL 167.76   LDL cholesterol, direct  Result Value Ref Range   Direct LDL 125.0 mg/dL

## 2021-07-26 ENCOUNTER — Ambulatory Visit (INDEPENDENT_AMBULATORY_CARE_PROVIDER_SITE_OTHER): Payer: BC Managed Care – PPO | Admitting: Family Medicine

## 2021-07-26 ENCOUNTER — Encounter: Payer: Self-pay | Admitting: Family Medicine

## 2021-07-26 VITALS — BP 122/60 | HR 81 | Temp 98.1°F | Resp 18 | Ht 70.0 in | Wt 227.2 lb

## 2021-07-26 DIAGNOSIS — E785 Hyperlipidemia, unspecified: Secondary | ICD-10-CM

## 2021-07-26 DIAGNOSIS — F32A Depression, unspecified: Secondary | ICD-10-CM

## 2021-07-26 DIAGNOSIS — E559 Vitamin D deficiency, unspecified: Secondary | ICD-10-CM

## 2021-07-26 DIAGNOSIS — Z5181 Encounter for therapeutic drug level monitoring: Secondary | ICD-10-CM

## 2021-07-26 DIAGNOSIS — Z23 Encounter for immunization: Secondary | ICD-10-CM

## 2021-07-26 DIAGNOSIS — Z131 Encounter for screening for diabetes mellitus: Secondary | ICD-10-CM | POA: Diagnosis not present

## 2021-07-26 DIAGNOSIS — D72829 Elevated white blood cell count, unspecified: Secondary | ICD-10-CM

## 2021-07-26 DIAGNOSIS — R5383 Other fatigue: Secondary | ICD-10-CM | POA: Diagnosis not present

## 2021-07-26 NOTE — Patient Instructions (Addendum)
It was good to see you again today  Flu shot given I would recommend getting the covid booster if not done already  We will be in touch with your labs asap   Try to work on a standard daily routine- do the best you can!  Include daily exercise  Please see me in about 6 months

## 2021-07-27 ENCOUNTER — Encounter: Payer: Self-pay | Admitting: Family Medicine

## 2021-07-27 DIAGNOSIS — F332 Major depressive disorder, recurrent severe without psychotic features: Secondary | ICD-10-CM | POA: Diagnosis not present

## 2021-07-27 LAB — COMPREHENSIVE METABOLIC PANEL
ALT: 26 U/L (ref 0–53)
AST: 17 U/L (ref 0–37)
Albumin: 4.7 g/dL (ref 3.5–5.2)
Alkaline Phosphatase: 50 U/L (ref 39–117)
BUN: 12 mg/dL (ref 6–23)
CO2: 28 mEq/L (ref 19–32)
Calcium: 9.9 mg/dL (ref 8.4–10.5)
Chloride: 102 mEq/L (ref 96–112)
Creatinine, Ser: 1.05 mg/dL (ref 0.40–1.50)
GFR: 95.18 mL/min (ref >60.00)
Glucose, Bld: 77 mg/dL (ref 70–99)
Potassium: 4.6 mEq/L (ref 3.5–5.1)
Sodium: 138 mEq/L (ref 135–145)
Total Bilirubin: 0.8 mg/dL (ref 0.2–1.2)
Total Protein: 7.7 g/dL (ref 6.0–8.3)

## 2021-07-27 LAB — CBC
HCT: 46.4 % (ref 39.0–52.0)
Hemoglobin: 14.8 g/dL (ref 13.0–17.0)
MCHC: 32 g/dL (ref 30.0–36.0)
MCV: 85.8 fl (ref 78.0–100.0)
Platelets: 380 10*3/uL (ref 150.0–400.0)
RBC: 5.41 Mil/uL (ref 4.22–5.81)
RDW: 12.6 % (ref 11.5–15.5)
WBC: 14.1 10*3/uL — ABNORMAL HIGH (ref 4.0–10.5)

## 2021-07-27 LAB — LIPID PANEL
Cholesterol: 204 mg/dL — ABNORMAL HIGH (ref 0–200)
HDL: 36 mg/dL — ABNORMAL LOW (ref >39.00)
NonHDL: 167.76
Total CHOL/HDL Ratio: 6
Triglycerides: 342 mg/dL — ABNORMAL HIGH (ref 0.0–149.0)
VLDL: 68.4 mg/dL — ABNORMAL HIGH (ref 0.0–40.0)

## 2021-07-27 LAB — TSH: TSH: 1.12 u[IU]/mL (ref 0.35–5.50)

## 2021-07-27 LAB — VITAMIN D 25 HYDROXY (VIT D DEFICIENCY, FRACTURES): VITD: 34.4 ng/mL (ref 30.00–100.00)

## 2021-07-27 LAB — HEMOGLOBIN A1C: Hgb A1c MFr Bld: 5 % (ref 4.6–6.5)

## 2021-07-27 LAB — LDL CHOLESTEROL, DIRECT: Direct LDL: 125 mg/dL

## 2021-07-27 LAB — LITHIUM LEVEL: Lithium Lvl: 0.3 mmol/L — ABNORMAL LOW (ref 0.6–1.2)

## 2021-07-27 NOTE — Addendum Note (Signed)
Addended by: Lamar Blinks C on: 07/27/2021 06:58 PM   Modules accepted: Orders

## 2021-07-28 DIAGNOSIS — F413 Other mixed anxiety disorders: Secondary | ICD-10-CM | POA: Diagnosis not present

## 2021-07-31 DIAGNOSIS — F332 Major depressive disorder, recurrent severe without psychotic features: Secondary | ICD-10-CM | POA: Diagnosis not present

## 2021-08-01 ENCOUNTER — Telehealth: Payer: Self-pay | Admitting: Family Medicine

## 2021-08-01 NOTE — Telephone Encounter (Signed)
Patient states he would like to send a copy on his labs to his physiatrist Dr. Nehemiah Massed so she can look at his lithium levels. Their fax number is [phone]. Please advice.

## 2021-08-01 NOTE — Telephone Encounter (Signed)
Labs have been faxed to number below.  I have called pt to let him know there was no answer so I left a message to call back if needed.

## 2021-08-02 DIAGNOSIS — G4733 Obstructive sleep apnea (adult) (pediatric): Secondary | ICD-10-CM | POA: Diagnosis not present

## 2021-08-03 DIAGNOSIS — F332 Major depressive disorder, recurrent severe without psychotic features: Secondary | ICD-10-CM | POA: Diagnosis not present

## 2021-08-07 ENCOUNTER — Telehealth: Payer: Self-pay

## 2021-08-07 ENCOUNTER — Institutional Professional Consult (permissible substitution): Payer: BC Managed Care – PPO | Admitting: Neurology

## 2021-08-07 NOTE — Telephone Encounter (Signed)
Per Dr. Rexene Alberts patient's sleep consultation needs to be cancelled due to patient seeing [facility] and receiving care from them. Patient did not answer phone so I left a detailed message explaining why his appointment was being cancelled and to please call back if he had any further questions. Letting check in know what is going on so they aware in case patient still shows up for appointment.

## 2021-08-08 DIAGNOSIS — F413 Other mixed anxiety disorders: Secondary | ICD-10-CM | POA: Diagnosis not present

## 2021-08-08 DIAGNOSIS — F332 Major depressive disorder, recurrent severe without psychotic features: Secondary | ICD-10-CM | POA: Diagnosis not present

## 2021-08-10 DIAGNOSIS — F332 Major depressive disorder, recurrent severe without psychotic features: Secondary | ICD-10-CM | POA: Diagnosis not present

## 2021-08-10 DIAGNOSIS — F413 Other mixed anxiety disorders: Secondary | ICD-10-CM | POA: Diagnosis not present

## 2021-08-11 DIAGNOSIS — F413 Other mixed anxiety disorders: Secondary | ICD-10-CM | POA: Diagnosis not present

## 2021-08-14 ENCOUNTER — Encounter: Payer: BC Managed Care – PPO | Attending: Family Medicine | Admitting: Dietician

## 2021-08-14 DIAGNOSIS — E669 Obesity, unspecified: Secondary | ICD-10-CM | POA: Insufficient documentation

## 2021-08-15 DIAGNOSIS — F332 Major depressive disorder, recurrent severe without psychotic features: Secondary | ICD-10-CM | POA: Diagnosis not present

## 2021-08-15 DIAGNOSIS — F413 Other mixed anxiety disorders: Secondary | ICD-10-CM | POA: Diagnosis not present

## 2021-08-17 DIAGNOSIS — F332 Major depressive disorder, recurrent severe without psychotic features: Secondary | ICD-10-CM | POA: Diagnosis not present

## 2021-08-22 DIAGNOSIS — F332 Major depressive disorder, recurrent severe without psychotic features: Secondary | ICD-10-CM | POA: Diagnosis not present

## 2021-08-22 DIAGNOSIS — F413 Other mixed anxiety disorders: Secondary | ICD-10-CM | POA: Diagnosis not present

## 2021-08-25 DIAGNOSIS — F413 Other mixed anxiety disorders: Secondary | ICD-10-CM | POA: Diagnosis not present

## 2021-08-28 DIAGNOSIS — F413 Other mixed anxiety disorders: Secondary | ICD-10-CM | POA: Diagnosis not present

## 2021-08-29 DIAGNOSIS — F332 Major depressive disorder, recurrent severe without psychotic features: Secondary | ICD-10-CM | POA: Diagnosis not present

## 2021-08-30 DIAGNOSIS — F413 Other mixed anxiety disorders: Secondary | ICD-10-CM | POA: Diagnosis not present

## 2021-08-31 DIAGNOSIS — F332 Major depressive disorder, recurrent severe without psychotic features: Secondary | ICD-10-CM | POA: Diagnosis not present

## 2021-09-02 DIAGNOSIS — G4733 Obstructive sleep apnea (adult) (pediatric): Secondary | ICD-10-CM | POA: Diagnosis not present

## 2021-09-04 DIAGNOSIS — F332 Major depressive disorder, recurrent severe without psychotic features: Secondary | ICD-10-CM | POA: Diagnosis not present

## 2021-09-05 DIAGNOSIS — F332 Major depressive disorder, recurrent severe without psychotic features: Secondary | ICD-10-CM | POA: Diagnosis not present

## 2021-09-06 DIAGNOSIS — F332 Major depressive disorder, recurrent severe without psychotic features: Secondary | ICD-10-CM | POA: Diagnosis not present

## 2021-09-07 DIAGNOSIS — F332 Major depressive disorder, recurrent severe without psychotic features: Secondary | ICD-10-CM | POA: Diagnosis not present

## 2021-09-08 DIAGNOSIS — F332 Major depressive disorder, recurrent severe without psychotic features: Secondary | ICD-10-CM | POA: Diagnosis not present

## 2021-09-12 DIAGNOSIS — F332 Major depressive disorder, recurrent severe without psychotic features: Secondary | ICD-10-CM | POA: Diagnosis not present

## 2021-09-13 DIAGNOSIS — F332 Major depressive disorder, recurrent severe without psychotic features: Secondary | ICD-10-CM | POA: Diagnosis not present

## 2021-09-14 DIAGNOSIS — F332 Major depressive disorder, recurrent severe without psychotic features: Secondary | ICD-10-CM | POA: Diagnosis not present

## 2021-09-15 DIAGNOSIS — F332 Major depressive disorder, recurrent severe without psychotic features: Secondary | ICD-10-CM | POA: Diagnosis not present

## 2021-09-18 DIAGNOSIS — F332 Major depressive disorder, recurrent severe without psychotic features: Secondary | ICD-10-CM | POA: Diagnosis not present

## 2021-09-19 DIAGNOSIS — F332 Major depressive disorder, recurrent severe without psychotic features: Secondary | ICD-10-CM | POA: Diagnosis not present

## 2021-09-21 DIAGNOSIS — F332 Major depressive disorder, recurrent severe without psychotic features: Secondary | ICD-10-CM | POA: Diagnosis not present

## 2021-09-22 DIAGNOSIS — F332 Major depressive disorder, recurrent severe without psychotic features: Secondary | ICD-10-CM | POA: Diagnosis not present

## 2021-09-25 DIAGNOSIS — F332 Major depressive disorder, recurrent severe without psychotic features: Secondary | ICD-10-CM | POA: Diagnosis not present

## 2021-09-26 DIAGNOSIS — F332 Major depressive disorder, recurrent severe without psychotic features: Secondary | ICD-10-CM | POA: Diagnosis not present

## 2021-09-27 DIAGNOSIS — F332 Major depressive disorder, recurrent severe without psychotic features: Secondary | ICD-10-CM | POA: Diagnosis not present

## 2021-09-28 DIAGNOSIS — F332 Major depressive disorder, recurrent severe without psychotic features: Secondary | ICD-10-CM | POA: Diagnosis not present

## 2021-09-29 DIAGNOSIS — F332 Major depressive disorder, recurrent severe without psychotic features: Secondary | ICD-10-CM | POA: Diagnosis not present

## 2021-09-30 DIAGNOSIS — G4733 Obstructive sleep apnea (adult) (pediatric): Secondary | ICD-10-CM | POA: Diagnosis not present

## 2021-10-03 DIAGNOSIS — F332 Major depressive disorder, recurrent severe without psychotic features: Secondary | ICD-10-CM | POA: Diagnosis not present

## 2021-10-04 DIAGNOSIS — F332 Major depressive disorder, recurrent severe without psychotic features: Secondary | ICD-10-CM | POA: Diagnosis not present

## 2021-10-05 DIAGNOSIS — F332 Major depressive disorder, recurrent severe without psychotic features: Secondary | ICD-10-CM | POA: Diagnosis not present

## 2021-10-06 DIAGNOSIS — F332 Major depressive disorder, recurrent severe without psychotic features: Secondary | ICD-10-CM | POA: Diagnosis not present

## 2021-10-09 DIAGNOSIS — F332 Major depressive disorder, recurrent severe without psychotic features: Secondary | ICD-10-CM | POA: Diagnosis not present

## 2021-10-10 DIAGNOSIS — F332 Major depressive disorder, recurrent severe without psychotic features: Secondary | ICD-10-CM | POA: Diagnosis not present

## 2021-10-11 DIAGNOSIS — F332 Major depressive disorder, recurrent severe without psychotic features: Secondary | ICD-10-CM | POA: Diagnosis not present

## 2021-10-12 DIAGNOSIS — F332 Major depressive disorder, recurrent severe without psychotic features: Secondary | ICD-10-CM | POA: Diagnosis not present

## 2021-10-13 DIAGNOSIS — F332 Major depressive disorder, recurrent severe without psychotic features: Secondary | ICD-10-CM | POA: Diagnosis not present

## 2021-10-16 DIAGNOSIS — F332 Major depressive disorder, recurrent severe without psychotic features: Secondary | ICD-10-CM | POA: Diagnosis not present

## 2021-10-17 DIAGNOSIS — F332 Major depressive disorder, recurrent severe without psychotic features: Secondary | ICD-10-CM | POA: Diagnosis not present

## 2021-10-18 DIAGNOSIS — F332 Major depressive disorder, recurrent severe without psychotic features: Secondary | ICD-10-CM | POA: Diagnosis not present

## 2021-10-19 DIAGNOSIS — F332 Major depressive disorder, recurrent severe without psychotic features: Secondary | ICD-10-CM | POA: Diagnosis not present

## 2021-10-20 DIAGNOSIS — F332 Major depressive disorder, recurrent severe without psychotic features: Secondary | ICD-10-CM | POA: Diagnosis not present

## 2021-10-23 DIAGNOSIS — F332 Major depressive disorder, recurrent severe without psychotic features: Secondary | ICD-10-CM | POA: Diagnosis not present

## 2021-10-24 DIAGNOSIS — F332 Major depressive disorder, recurrent severe without psychotic features: Secondary | ICD-10-CM | POA: Diagnosis not present

## 2021-10-25 DIAGNOSIS — F332 Major depressive disorder, recurrent severe without psychotic features: Secondary | ICD-10-CM | POA: Diagnosis not present

## 2021-10-26 DIAGNOSIS — F332 Major depressive disorder, recurrent severe without psychotic features: Secondary | ICD-10-CM | POA: Diagnosis not present

## 2021-10-27 DIAGNOSIS — F332 Major depressive disorder, recurrent severe without psychotic features: Secondary | ICD-10-CM | POA: Diagnosis not present

## 2021-10-31 DIAGNOSIS — F332 Major depressive disorder, recurrent severe without psychotic features: Secondary | ICD-10-CM | POA: Diagnosis not present

## 2021-10-31 DIAGNOSIS — G4733 Obstructive sleep apnea (adult) (pediatric): Secondary | ICD-10-CM | POA: Diagnosis not present

## 2021-11-07 DIAGNOSIS — F331 Major depressive disorder, recurrent, moderate: Secondary | ICD-10-CM | POA: Diagnosis not present

## 2021-11-07 DIAGNOSIS — F5101 Primary insomnia: Secondary | ICD-10-CM | POA: Diagnosis not present

## 2021-11-07 DIAGNOSIS — F341 Dysthymic disorder: Secondary | ICD-10-CM | POA: Diagnosis not present

## 2021-11-07 DIAGNOSIS — F411 Generalized anxiety disorder: Secondary | ICD-10-CM | POA: Diagnosis not present

## 2021-11-07 DIAGNOSIS — F413 Other mixed anxiety disorders: Secondary | ICD-10-CM | POA: Diagnosis not present

## 2021-11-08 ENCOUNTER — Encounter: Payer: Self-pay | Admitting: Family Medicine

## 2021-11-08 ENCOUNTER — Ambulatory Visit (INDEPENDENT_AMBULATORY_CARE_PROVIDER_SITE_OTHER): Payer: BC Managed Care – PPO | Admitting: Family Medicine

## 2021-11-08 VITALS — BP 110/60 | HR 51 | Temp 97.6°F | Resp 18 | Ht 70.0 in | Wt 220.4 lb

## 2021-11-08 DIAGNOSIS — Z5181 Encounter for therapeutic drug level monitoring: Secondary | ICD-10-CM | POA: Diagnosis not present

## 2021-11-08 DIAGNOSIS — R5383 Other fatigue: Secondary | ICD-10-CM | POA: Diagnosis not present

## 2021-11-08 DIAGNOSIS — R001 Bradycardia, unspecified: Secondary | ICD-10-CM

## 2021-11-08 LAB — BASIC METABOLIC PANEL
BUN: 11 mg/dL (ref 6–23)
CO2: 28 mEq/L (ref 19–32)
Calcium: 9.7 mg/dL (ref 8.4–10.5)
Chloride: 104 mEq/L (ref 96–112)
Creatinine, Ser: 0.81 mg/dL (ref 0.40–1.50)
GFR: 117.98 mL/min (ref >60.00)
Glucose, Bld: 86 mg/dL (ref 70–99)
Potassium: 4.2 mEq/L (ref 3.5–5.1)
Sodium: 139 mEq/L (ref 135–145)

## 2021-11-08 LAB — CBC
HCT: 46.4 % (ref 39.0–52.0)
Hemoglobin: 15.3 g/dL (ref 13.0–17.0)
MCHC: 33 g/dL (ref 30.0–36.0)
MCV: 84.2 fl (ref 78.0–100.0)
Platelets: 379 10*3/uL (ref 150.0–400.0)
RBC: 5.52 Mil/uL (ref 4.22–5.81)
RDW: 12.8 % (ref 11.5–15.5)
WBC: 10.7 10*3/uL — ABNORMAL HIGH (ref 4.0–10.5)

## 2021-11-08 LAB — TSH: TSH: 0.88 u[IU]/mL (ref 0.35–5.50)

## 2021-11-08 NOTE — Progress Notes (Addendum)
Therapist, music at [facility] ?[address], [address] ?[place], [place] [address] ?336 [phone] ?Fax [phone] ? ?Date:  11/08/2021  ? ?Name:  Alexander Harrington   DOB:  06-12-1991   MRN:  [id number] ? ?PCP:  Darreld Mclean, MD  ? ? ?Chief Complaint: Follow-up (Concerns/ questions: fatigue, dizziness, psych requested labs- pt is fasting) ? ? ?History of Present Illness: ? ?Alexander Harrington is a 31 y.o. very pleasant male patient who presents with the following: ? ?Patient seen today for follow-up- history of mood disorder, hypothyroidism and hyperlipidemia, sleep apnea ?Most recent visit with myself was in January; at that time you have recently been through a break-up which is left him feeling quite sad.  He did a residential depression treatment last summer ?He does have psychiatric care- Dr Toy Care  ? ?Today Alexander Harrington notes he is feeling "weak" - he notes he is moving and was recently in an intensive outpt treatment program for depression, he thinks he might just be stressed and overwhelmed ? ?He is moving out of his parents home-this is a positive step and he is overall happy but does feel somewhat stressed ?He might feel lightheaded and had a hard time keeping up with friend who were helping him to move- he does note he was having a "lot of panic attacks" while he was moving which might have contributed  ?Admits he was taking more klonopin than normal during the move also ?He was prescribed propranolol but notes he has not started taking it yet ? ?We did labs in January - TSH normal at that time ?He needs a lithium and TSH, GFR done today per Dr Toy Care ?He did have mild leukocytosis at most recent labs, check today ? ?He is trying to get more exercise- he is walking and plans to get a bike  ? ?Wt Readings from Last 3 Encounters:  ?11/08/21 220 lb 6.4 oz (100 kg)  ?07/26/21 227 lb 3.2 oz (103.1 kg)  ?06/08/21 233 lb 1.6 oz (105.7 kg)  ? ?11/21  249 lbs ? ?BP Readings from Last 3 Encounters:  ?11/08/21  110/60  ?07/26/21 122/60  ?06/27/20 129/83  ? ?Pulse Readings from Last 3 Encounters:  ?11/08/21 (!) 51  ?07/26/21 81  ?06/27/20 (!) 110  ? ? ? ?Patient Active Problem List  ? Diagnosis Date Noted  ? Hyperlipidemia 06/03/2020  ? Bipolar I disorder, most recent episode depressed ([place]) 08/07/2017  ?  Class: Chronic  ? Hoarseness of voice 10/04/2015  ? Tachycardia 08/30/2015  ? Leukocytosis 02/08/2015  ? Other specified hypothyroidism 02/08/2015  ? MDD (major depressive disorder), recurrent episode, severe ([place]) 12/01/2013  ? Substance induced mood disorder ([place]) 04/11/2012  ?  Class: Acute  ? Severe episode of recurrent major depressive disorder ([place]) 04/09/2012  ?  Class: Acute  ? Keratosis pilaris 02/21/2011  ? ? ?Past Medical History:  ?Diagnosis Date  ? Anxiety   ? Dr. Toy Care  ? Bipolar 2 disorder ([place])   ? Depression   ? ? ?No past surgical history on file. ? ?Social History  ? ?Tobacco Use  ? Smoking status: Never  ? Smokeless tobacco: Never  ?Vaping Use  ? Vaping Use: Never used  ?Substance Use Topics  ? Alcohol use: Yes  ?  Alcohol/week: 5.0 - 6.0 standard drinks  ?  Types: 5 - 6 Cans of beer per week  ?  Comment: once a week or every other week  ? Drug use: Yes  ?  Frequency: 1.0 times per week  ?  Types: Marijuana  ?  Comment: Occasional use  ? ? ?Family History  ?Problem Relation Age of Onset  ? Arthritis Mother   ? Mental illness Mother   ? Depression Mother   ? Anxiety disorder Mother   ? Drug abuse Maternal Grandfather   ? Alcohol abuse Maternal Grandfather   ? Drug abuse Maternal Grandmother   ? Alcohol abuse Maternal Grandmother   ? ? ?Allergies  ?Allergen Reactions  ? Amphetamines Other (See Comments)  ?  Caused depression that went away after stopping the amphetamine  ? Benzodiazepines Other (See Comments)  ?  Got cloudy thinking, loss of memory, suicidal thinking on Klonopin.  ? Codeine Rash  ? Crestor [Rosuvastatin] Other (See Comments)  ?  Myalgia  ? ? ?Medication list has been reviewed and  updated. ? ?Current Outpatient Medications on File Prior to Visit  ?Medication Sig Dispense Refill  ? Cholecalciferol (VITAMIN D3) 1.25 MG (50000 UT) CAPS Take by mouth once a week.    ? clonazePAM (KLONOPIN) 1 MG tablet Take 1 mg by mouth 3 (three) times daily as needed.    ? lithium carbonate (LITHOBID) 300 MG CR tablet Take 300 mg by mouth 2 (two) times daily.    ? propranolol ER (INDERAL LA) 60 MG 24 hr capsule Take 60 mg by mouth daily.    ? rosuvastatin (CRESTOR) 5 MG tablet TAKE 1 TABLET(5 MG) BY MOUTH DAILY 90 tablet 3  ? tiZANidine (ZANAFLEX) 4 MG tablet Take 4 mg by mouth 2 (two) times daily.    ? traZODone (DESYREL) 50 MG tablet Take 50-150 mg by mouth at bedtime as needed.    ? VRAYLAR 1.5 MG capsule Take 1.5 mg by mouth.    ? ?No current facility-administered medications on file prior to visit.  ? ? ?Review of Systems: ? ?As per HPI- otherwise negative. ? ? ?Physical Examination: ?Vitals:  ? 11/08/21 1057  ?BP: 110/60  ?Pulse: (!) 51  ?Resp: 18  ?Temp: 97.6 ?F (36.4 ?C)  ?SpO2: 98%  ? ?Vitals:  ? 11/08/21 1057  ?Weight: 220 lb 6.4 oz (100 kg)  ?Height: '5\' 10"'$  (1.778 m)  ? ?Body mass index is 31.62 kg/m?. ?Ideal Body Weight: Weight in (lb) to have BMI = 25: 173.9 ? ?GEN: no acute distress.  Mildly obese, looks well and his normal self ?HEENT: Atraumatic, Normocephalic.  ?Ears and Nose: No external deformity. ?CV: RRR, No M/G/R. No JVD. No thrill. No extra heart sounds. ?PULM: CTA B, no wheezes, crackles, rhonchi. No retractions. No resp. distress. No accessory muscle use. ?ABD: S, NT, ND, +BS. No rebound. No HSM. ?EXTR: No c/c/e ?PSYCH: Normally interactive. Conversant.  ? ?EKG: Sinus bradycardia with rate of 55 bpm is noted.  Otherwise normal EKG ?Assessment and Plan: ?Medication monitoring encounter - Plan: Basic metabolic panel, TSH, Lithium level, CBC ? ?Bradycardia - Plan: EKG 12-Lead ? ?Other fatigue ? ?Patient is seen today for follow-up.  His main concern today is fatigue which has been a chronic  issue for him.  Certainly some of his symptoms may be due to his psychotropic medications.  We do note relative bradycardia today, he feels certain that he has not yet started on his beta-blocker.  He is able to monitor his pulse at home with a pulse oximeter.  I asked him to please monitor his pulse, if he continues to run less than 60 please let me know.  I also advised him that  since his pulse is so low I would recommend touching base with Dr. Toy Care prior to starting on beta-blocker ? ?Otherwise we will check his lab work as above to look for any other cause of fatigue ? ?I encouraged him to follow a regular sleep, eating, and exercise schedule ? ?Signed ?Lamar Blinks, MD ? ?Received labs as below, message to patient ? ?Results for orders placed or performed in visit on 11/08/21  ?Basic metabolic panel  ?Result Value Ref Range  ? Sodium 139 135 - 145 mEq/L  ? Potassium 4.2 3.5 - 5.1 mEq/L  ? Chloride 104 96 - 112 mEq/L  ? CO2 28 19 - 32 mEq/L  ? Glucose, Bld 86 70 - 99 mg/dL  ? BUN 11 6 - 23 mg/dL  ? Creatinine, Ser 0.81 0.40 - 1.50 mg/dL  ? GFR 117.98 >60.00 mL/min  ? Calcium 9.7 8.4 - 10.5 mg/dL  ?TSH  ?Result Value Ref Range  ? TSH 0.88 0.35 - 5.50 uIU/mL  ?CBC  ?Result Value Ref Range  ? WBC 10.7 (H) 4.0 - 10.5 K/uL  ? RBC 5.52 4.22 - 5.81 Mil/uL  ? Platelets 379.0 150.0 - 400.0 K/uL  ? Hemoglobin 15.3 13.0 - 17.0 g/dL  ? HCT 46.4 39.0 - 52.0 %  ? MCV 84.2 78.0 - 100.0 fl  ? MCHC 33.0 30.0 - 36.0 g/dL  ? RDW 12.8 11.5 - 15.5 %  ? ? ? ?

## 2021-11-08 NOTE — Patient Instructions (Addendum)
It was good to see you again today- I will be in touch with your labs asap ?I noticed your pulse is lower today- taking propranolol may cause your pulse to go lower.  Please touch base with Dr Toy Care prior to staring on propranolol- pulse today about 50- 55 ? ?Keep an eye on your pulse for me- if it continues to run under 60 please let me know  ?

## 2021-11-09 ENCOUNTER — Encounter: Payer: Self-pay | Admitting: Family Medicine

## 2021-11-09 DIAGNOSIS — F413 Other mixed anxiety disorders: Secondary | ICD-10-CM | POA: Diagnosis not present

## 2021-11-09 LAB — LITHIUM LEVEL: Lithium Lvl: 0.3 mmol/L — ABNORMAL LOW (ref 0.6–1.2)

## 2021-11-13 ENCOUNTER — Ambulatory Visit: Payer: BC Managed Care – PPO | Admitting: Family Medicine

## 2021-11-14 DIAGNOSIS — F413 Other mixed anxiety disorders: Secondary | ICD-10-CM | POA: Diagnosis not present

## 2021-11-17 DIAGNOSIS — F413 Other mixed anxiety disorders: Secondary | ICD-10-CM | POA: Diagnosis not present

## 2021-11-30 DIAGNOSIS — G4733 Obstructive sleep apnea (adult) (pediatric): Secondary | ICD-10-CM | POA: Diagnosis not present

## 2021-12-01 DIAGNOSIS — F411 Generalized anxiety disorder: Secondary | ICD-10-CM | POA: Diagnosis not present

## 2021-12-01 DIAGNOSIS — F331 Major depressive disorder, recurrent, moderate: Secondary | ICD-10-CM | POA: Diagnosis not present

## 2021-12-01 DIAGNOSIS — F439 Reaction to severe stress, unspecified: Secondary | ICD-10-CM | POA: Diagnosis not present

## 2021-12-05 DIAGNOSIS — F439 Reaction to severe stress, unspecified: Secondary | ICD-10-CM | POA: Diagnosis not present

## 2021-12-05 DIAGNOSIS — F411 Generalized anxiety disorder: Secondary | ICD-10-CM | POA: Diagnosis not present

## 2021-12-05 DIAGNOSIS — F331 Major depressive disorder, recurrent, moderate: Secondary | ICD-10-CM | POA: Diagnosis not present

## 2021-12-07 DIAGNOSIS — F411 Generalized anxiety disorder: Secondary | ICD-10-CM | POA: Diagnosis not present

## 2021-12-07 DIAGNOSIS — F900 Attention-deficit hyperactivity disorder, predominantly inattentive type: Secondary | ICD-10-CM | POA: Diagnosis not present

## 2021-12-07 DIAGNOSIS — F332 Major depressive disorder, recurrent severe without psychotic features: Secondary | ICD-10-CM | POA: Diagnosis not present

## 2021-12-08 DIAGNOSIS — F332 Major depressive disorder, recurrent severe without psychotic features: Secondary | ICD-10-CM | POA: Diagnosis not present

## 2021-12-08 DIAGNOSIS — F439 Reaction to severe stress, unspecified: Secondary | ICD-10-CM | POA: Diagnosis not present

## 2021-12-08 DIAGNOSIS — F411 Generalized anxiety disorder: Secondary | ICD-10-CM | POA: Diagnosis not present

## 2021-12-08 DIAGNOSIS — F331 Major depressive disorder, recurrent, moderate: Secondary | ICD-10-CM | POA: Diagnosis not present

## 2021-12-13 DIAGNOSIS — F411 Generalized anxiety disorder: Secondary | ICD-10-CM | POA: Diagnosis not present

## 2021-12-13 DIAGNOSIS — F439 Reaction to severe stress, unspecified: Secondary | ICD-10-CM | POA: Diagnosis not present

## 2021-12-13 DIAGNOSIS — F332 Major depressive disorder, recurrent severe without psychotic features: Secondary | ICD-10-CM | POA: Diagnosis not present

## 2021-12-13 DIAGNOSIS — F331 Major depressive disorder, recurrent, moderate: Secondary | ICD-10-CM | POA: Diagnosis not present

## 2021-12-15 DIAGNOSIS — F332 Major depressive disorder, recurrent severe without psychotic features: Secondary | ICD-10-CM | POA: Diagnosis not present

## 2021-12-19 DIAGNOSIS — F331 Major depressive disorder, recurrent, moderate: Secondary | ICD-10-CM | POA: Diagnosis not present

## 2021-12-19 DIAGNOSIS — F439 Reaction to severe stress, unspecified: Secondary | ICD-10-CM | POA: Diagnosis not present

## 2021-12-19 DIAGNOSIS — F411 Generalized anxiety disorder: Secondary | ICD-10-CM | POA: Diagnosis not present

## 2021-12-20 DIAGNOSIS — F332 Major depressive disorder, recurrent severe without psychotic features: Secondary | ICD-10-CM | POA: Diagnosis not present

## 2021-12-22 DIAGNOSIS — F332 Major depressive disorder, recurrent severe without psychotic features: Secondary | ICD-10-CM | POA: Diagnosis not present

## 2021-12-26 DIAGNOSIS — F439 Reaction to severe stress, unspecified: Secondary | ICD-10-CM | POA: Diagnosis not present

## 2021-12-26 DIAGNOSIS — F331 Major depressive disorder, recurrent, moderate: Secondary | ICD-10-CM | POA: Diagnosis not present

## 2021-12-26 DIAGNOSIS — F411 Generalized anxiety disorder: Secondary | ICD-10-CM | POA: Diagnosis not present

## 2021-12-27 DIAGNOSIS — F332 Major depressive disorder, recurrent severe without psychotic features: Secondary | ICD-10-CM | POA: Diagnosis not present

## 2021-12-29 DIAGNOSIS — F332 Major depressive disorder, recurrent severe without psychotic features: Secondary | ICD-10-CM | POA: Diagnosis not present

## 2021-12-31 DIAGNOSIS — G4733 Obstructive sleep apnea (adult) (pediatric): Secondary | ICD-10-CM | POA: Diagnosis not present

## 2022-01-02 DIAGNOSIS — F439 Reaction to severe stress, unspecified: Secondary | ICD-10-CM | POA: Diagnosis not present

## 2022-01-02 DIAGNOSIS — F411 Generalized anxiety disorder: Secondary | ICD-10-CM | POA: Diagnosis not present

## 2022-01-02 DIAGNOSIS — F331 Major depressive disorder, recurrent, moderate: Secondary | ICD-10-CM | POA: Diagnosis not present

## 2022-01-03 DIAGNOSIS — F332 Major depressive disorder, recurrent severe without psychotic features: Secondary | ICD-10-CM | POA: Diagnosis not present

## 2022-01-04 DIAGNOSIS — F331 Major depressive disorder, recurrent, moderate: Secondary | ICD-10-CM | POA: Diagnosis not present

## 2022-01-04 DIAGNOSIS — F900 Attention-deficit hyperactivity disorder, predominantly inattentive type: Secondary | ICD-10-CM | POA: Diagnosis not present

## 2022-01-05 DIAGNOSIS — F332 Major depressive disorder, recurrent severe without psychotic features: Secondary | ICD-10-CM | POA: Diagnosis not present

## 2022-01-11 DIAGNOSIS — F331 Major depressive disorder, recurrent, moderate: Secondary | ICD-10-CM | POA: Diagnosis not present

## 2022-01-11 DIAGNOSIS — F439 Reaction to severe stress, unspecified: Secondary | ICD-10-CM | POA: Diagnosis not present

## 2022-01-11 DIAGNOSIS — F411 Generalized anxiety disorder: Secondary | ICD-10-CM | POA: Diagnosis not present

## 2022-01-16 DIAGNOSIS — F411 Generalized anxiety disorder: Secondary | ICD-10-CM | POA: Diagnosis not present

## 2022-01-16 DIAGNOSIS — F439 Reaction to severe stress, unspecified: Secondary | ICD-10-CM | POA: Diagnosis not present

## 2022-01-16 DIAGNOSIS — F331 Major depressive disorder, recurrent, moderate: Secondary | ICD-10-CM | POA: Diagnosis not present

## 2022-01-19 DIAGNOSIS — F314 Bipolar disorder, current episode depressed, severe, without psychotic features: Secondary | ICD-10-CM | POA: Diagnosis not present

## 2022-01-21 NOTE — Progress Notes (Deleted)
[place] [facility] [address], [place], [place] [address] [phone]  Date:  01/29/2022   Name:  Alexander Harrington   DOB:  [birth year]   MRN:  [id number]  PCP:  Darreld Mclean, MD    Chief Complaint: No chief complaint on file.   History of Present Illness:  Alexander Harrington is a 31 y.o. very pleasant male patient who presents with the following:  Pt seen today for a CPE Last seen by myself in April   History of mood disorder, hypothyroidism and hyperlipidemia, sleep apnea  From our last visit: Today Alexander Harrington notes he is feeling "weak" - he notes he is moving and was recently in an intensive outpt treatment program for depression, he thinks he might just be stressed and overwhelmed He is moving out of his parents home-this is a positive step and he is overall happy but does feel somewhat stressed He might feel lightheaded and had a hard time keeping up with friend who were helping him to move- he does note he was having a "lot of panic attacks" while he was moving which might have contributed  Admits he was taking more klonopin than normal during the move also He was prescribed propranolol but notes he has not started taking it yet    Lab Results  Component Value Date   TSH 0.88 11/08/2021        Patient Active Problem List   Diagnosis Date Noted   Hyperlipidemia 06/03/2020   Bipolar I disorder, most recent episode depressed ([place]) 08/07/2017    Class: Chronic   Hoarseness of voice 10/04/2015   Tachycardia 08/30/2015   Leukocytosis 02/08/2015   Other specified hypothyroidism 02/08/2015   MDD (major depressive disorder), recurrent episode, severe ([place]) 12/01/2013   Substance induced mood disorder ([place]) 04/11/2012    Class: Acute   Severe episode of recurrent major depressive disorder ([place]) 04/09/2012    Class: Acute   Keratosis pilaris 02/21/2011    Past Medical History:  Diagnosis Date   Anxiety    Dr. Toy Care    Bipolar 2 disorder [facility])    Depression     No past surgical history on file.  Social History   Tobacco Use   Smoking status: Never   Smokeless tobacco: Never  Vaping Use   Vaping Use: Never used  Substance Use Topics   Alcohol use: Yes    Alcohol/week: 5.0 - 6.0 standard drinks of alcohol    Types: 5 - 6 Cans of beer per week    Comment: once a week or every other week   Drug use: Yes    Frequency: 1.0 times per week    Types: Marijuana    Comment: Occasional use    Family History  Problem Relation Age of Onset   Arthritis Mother    Mental illness Mother    Depression Mother    Anxiety disorder Mother    Drug abuse Maternal Grandfather    Alcohol abuse Maternal Grandfather    Drug abuse Maternal Grandmother    Alcohol abuse Maternal Grandmother     Allergies  Allergen Reactions   Amphetamines Other (See Comments)    Caused depression that went away after stopping the amphetamine   Benzodiazepines Other (See Comments)    Got cloudy thinking, loss of memory, suicidal thinking on Klonopin.   Codeine Rash   Crestor [Rosuvastatin] Other (See Comments)    Myalgia    Medication list  has been reviewed and updated.  Current Outpatient Medications on File Prior to Visit  Medication Sig Dispense Refill   Cholecalciferol (VITAMIN D3) 1.25 MG (50000 UT) CAPS Take by mouth once a week.     clonazePAM (KLONOPIN) 1 MG tablet Take 1 mg by mouth 3 (three) times daily as needed.     lithium carbonate (LITHOBID) 300 MG CR tablet Take 300 mg by mouth 2 (two) times daily.     propranolol ER (INDERAL LA) 60 MG 24 hr capsule Take 60 mg by mouth daily.     rosuvastatin (CRESTOR) 5 MG tablet TAKE 1 TABLET(5 MG) BY MOUTH DAILY 90 tablet 3   tiZANidine (ZANAFLEX) 4 MG tablet Take 4 mg by mouth 2 (two) times daily.     traZODone (DESYREL) 50 MG tablet Take 50-150 mg by mouth at bedtime as needed.     VRAYLAR 1.5 MG capsule Take 1.5 mg by mouth.     No current facility-administered  medications on file prior to visit.    Review of Systems:  As per HPI- otherwise negative.   Physical Examination: There were no vitals filed for this visit. There were no vitals filed for this visit. There is no height or weight on file to calculate BMI. Ideal Body Weight:    GEN: no acute distress. HEENT: Atraumatic, Normocephalic.  Ears and Nose: No external deformity. CV: RRR, No M/G/R. No JVD. No thrill. No extra heart sounds. PULM: CTA B, no wheezes, crackles, rhonchi. No retractions. No resp. distress. No accessory muscle use. ABD: S, NT, ND, +BS. No rebound. No HSM. EXTR: No c/c/e PSYCH: Normally interactive. Conversant.    Assessment and Plan: *** Physical exam. Encouraged healthy diet and exercise routine Will plan further follow- up pending labs.  Signed Lamar Blinks, MD

## 2022-01-22 DIAGNOSIS — F314 Bipolar disorder, current episode depressed, severe, without psychotic features: Secondary | ICD-10-CM | POA: Diagnosis not present

## 2022-01-24 DIAGNOSIS — F314 Bipolar disorder, current episode depressed, severe, without psychotic features: Secondary | ICD-10-CM | POA: Diagnosis not present

## 2022-01-26 DIAGNOSIS — F314 Bipolar disorder, current episode depressed, severe, without psychotic features: Secondary | ICD-10-CM | POA: Diagnosis not present

## 2022-01-29 ENCOUNTER — Encounter: Payer: BC Managed Care – PPO | Admitting: Family Medicine

## 2022-01-29 DIAGNOSIS — D72829 Elevated white blood cell count, unspecified: Secondary | ICD-10-CM

## 2022-01-29 DIAGNOSIS — Z0000 Encounter for general adult medical examination without abnormal findings: Secondary | ICD-10-CM

## 2022-01-29 DIAGNOSIS — F314 Bipolar disorder, current episode depressed, severe, without psychotic features: Secondary | ICD-10-CM | POA: Diagnosis not present

## 2022-01-29 DIAGNOSIS — E785 Hyperlipidemia, unspecified: Secondary | ICD-10-CM

## 2022-01-30 DIAGNOSIS — G4733 Obstructive sleep apnea (adult) (pediatric): Secondary | ICD-10-CM | POA: Diagnosis not present

## 2022-01-31 DIAGNOSIS — F314 Bipolar disorder, current episode depressed, severe, without psychotic features: Secondary | ICD-10-CM | POA: Diagnosis not present

## 2022-02-09 DIAGNOSIS — F319 Bipolar disorder, unspecified: Secondary | ICD-10-CM | POA: Diagnosis not present

## 2022-02-09 DIAGNOSIS — F411 Generalized anxiety disorder: Secondary | ICD-10-CM | POA: Diagnosis not present

## 2022-02-09 DIAGNOSIS — F909 Attention-deficit hyperactivity disorder, unspecified type: Secondary | ICD-10-CM | POA: Diagnosis not present

## 2022-02-09 DIAGNOSIS — F332 Major depressive disorder, recurrent severe without psychotic features: Secondary | ICD-10-CM | POA: Diagnosis not present

## 2022-02-15 DIAGNOSIS — F332 Major depressive disorder, recurrent severe without psychotic features: Secondary | ICD-10-CM | POA: Diagnosis not present

## 2022-02-15 DIAGNOSIS — F319 Bipolar disorder, unspecified: Secondary | ICD-10-CM | POA: Diagnosis not present

## 2022-02-15 DIAGNOSIS — F909 Attention-deficit hyperactivity disorder, unspecified type: Secondary | ICD-10-CM | POA: Diagnosis not present

## 2022-02-15 DIAGNOSIS — F411 Generalized anxiety disorder: Secondary | ICD-10-CM | POA: Diagnosis not present

## 2022-02-20 ENCOUNTER — Ambulatory Visit (INDEPENDENT_AMBULATORY_CARE_PROVIDER_SITE_OTHER): Payer: BC Managed Care – PPO | Admitting: Psychiatry

## 2022-02-20 DIAGNOSIS — F332 Major depressive disorder, recurrent severe without psychotic features: Secondary | ICD-10-CM

## 2022-02-20 NOTE — Progress Notes (Signed)
Virtual Visit via Telephone Note  I connected with Alexander Harrington on 02/20/22 at  1:00 PM EDT by telephone and verified that I am speaking with the correct person using two identifiers.  Location: Patient: Home Provider: Hospital   I discussed the limitations, risks, security and privacy concerns of performing an evaluation and management service by telephone and the availability of in person appointments. I also discussed with the patient that there may be a patient responsible charge related to this service. The patient expressed understanding and agreed to proceed.   History of Present Illness: Patient was reached by telephone.  Patient's father was also present on the call with the patient's agreement.  Identities established.  Established that the reason for the appointment was to discuss electroconvulsive therapy.  Patient has had ECT in the past here in 2018 and has memories of the treatment.  He was urged to schedule another appointment to possibly consider ECT by his primary psychiatrist.  Patient reports that his mood continues to be bad down and depressed negative most of the time.  He emphasizes feeling very anxious.  Feels socially anxious a great deal.  Has trouble being around other people.  Patient and father both describe him as being "paralyzed" in the sense of not having the motivation or will to get up and leave the house or do anything.  Energy level is poor.  Frequent crying spells.  He does not report any active suicidal ideation and he does not report any psychotic symptoms.  He is currently receiving medication management with Vraylar Trintellix and lithium and olanzapine and Klonopin.  Medicines have been stable with only partial benefit.  Patient recently had stays at residential programs and partial hospital programs with only partial improvement.  Patient appeared anxious and nervous.  Frequently seems somewhat irritated with his father.  Patient made it clear to me that he  did not feel positively about getting ECT again and that he had fears about side effects.  His fears were realistic based on previous problems with short-term memory impairment.  I reviewed with the patient the potential side effects including short-term memory impairment and confusion, anterior grade memory loss for time slightly before the ECT, as well as pain and heart arrhythmias.  I emphasized to the patient that there was very minimal risk of long term side effects or complications once ECT was finished.  Patient appeared to be knowledgeable and able to ask appropriate questions and make decisions based on rational information.  Patient is currently living with his parents.  Not working.  Minimal activity.  No specific health problems outside of mental health issues.  No reported substance abuse    Observations/Objective: Patient's affect as noted above appears anxious but he was alert and oriented and understood the nature of the conversation and showed evidence of understanding what we were discussing asking multiple appropriate questions.  Did not report any suicidal thoughts.  No evidence of immediate dangerousness.   Assessment and Plan: Patient made it clear that he did not prefer to do ECT stating that he would prefer to have transcranial magnetic stimulation which has been done for him in the past.  I told him that it was certainly his decision to make and that I was here to answer questions and provide support.  I reminded him that based on our records we had felt that ECT had been effective for him in the past however the patient is downplaying this somewhat and emphasizing the side effects.  Patient was not willing to commit to planning for ECT.  He says he will be meeting with his primary psychiatrist this week and discuss it.  I told him that we were very open to providing treatment and that if he decides he would like to pursue treatment he should call back to the office and leave a  message I can get back in touch with him and then we can discuss follow-up and scheduling.  Patient and father both expressed understanding.   Follow Up Instructions:    I discussed the assessment and treatment plan with the patient. The patient was provided an opportunity to ask questions and all were answered. The patient agreed with the plan and demonstrated an understanding of the instructions.   The patient was advised to call back or seek an in-person evaluation if the symptoms worsen or if the condition fails to improve as anticipated.  I provided 30 minutes of non-face-to-face time during this encounter.   Alethia Berthold, MD

## 2022-02-21 DIAGNOSIS — F411 Generalized anxiety disorder: Secondary | ICD-10-CM | POA: Diagnosis not present

## 2022-02-21 DIAGNOSIS — F900 Attention-deficit hyperactivity disorder, predominantly inattentive type: Secondary | ICD-10-CM | POA: Diagnosis not present

## 2022-02-21 DIAGNOSIS — F332 Major depressive disorder, recurrent severe without psychotic features: Secondary | ICD-10-CM | POA: Diagnosis not present

## 2022-03-02 DIAGNOSIS — F332 Major depressive disorder, recurrent severe without psychotic features: Secondary | ICD-10-CM | POA: Diagnosis not present

## 2022-03-02 DIAGNOSIS — G4733 Obstructive sleep apnea (adult) (pediatric): Secondary | ICD-10-CM | POA: Diagnosis not present

## 2022-03-06 DIAGNOSIS — F413 Other mixed anxiety disorders: Secondary | ICD-10-CM | POA: Diagnosis not present

## 2022-03-12 DIAGNOSIS — F413 Other mixed anxiety disorders: Secondary | ICD-10-CM | POA: Diagnosis not present

## 2022-03-20 DIAGNOSIS — F413 Other mixed anxiety disorders: Secondary | ICD-10-CM | POA: Diagnosis not present

## 2022-03-20 DIAGNOSIS — F332 Major depressive disorder, recurrent severe without psychotic features: Secondary | ICD-10-CM | POA: Diagnosis not present

## 2022-03-23 DIAGNOSIS — F332 Major depressive disorder, recurrent severe without psychotic features: Secondary | ICD-10-CM | POA: Diagnosis not present

## 2022-03-28 DIAGNOSIS — F332 Major depressive disorder, recurrent severe without psychotic features: Secondary | ICD-10-CM | POA: Diagnosis not present

## 2022-03-30 DIAGNOSIS — F332 Major depressive disorder, recurrent severe without psychotic features: Secondary | ICD-10-CM | POA: Diagnosis not present

## 2022-04-02 DIAGNOSIS — G4733 Obstructive sleep apnea (adult) (pediatric): Secondary | ICD-10-CM | POA: Diagnosis not present

## 2022-04-03 DIAGNOSIS — F332 Major depressive disorder, recurrent severe without psychotic features: Secondary | ICD-10-CM | POA: Diagnosis not present

## 2022-04-05 DIAGNOSIS — G4733 Obstructive sleep apnea (adult) (pediatric): Secondary | ICD-10-CM | POA: Diagnosis not present

## 2022-04-09 DIAGNOSIS — F332 Major depressive disorder, recurrent severe without psychotic features: Secondary | ICD-10-CM | POA: Diagnosis not present

## 2022-04-12 DIAGNOSIS — F332 Major depressive disorder, recurrent severe without psychotic features: Secondary | ICD-10-CM | POA: Diagnosis not present

## 2022-04-17 DIAGNOSIS — F332 Major depressive disorder, recurrent severe without psychotic features: Secondary | ICD-10-CM | POA: Diagnosis not present

## 2022-04-24 DIAGNOSIS — F332 Major depressive disorder, recurrent severe without psychotic features: Secondary | ICD-10-CM | POA: Diagnosis not present

## 2022-04-26 DIAGNOSIS — F3181 Bipolar II disorder: Secondary | ICD-10-CM | POA: Diagnosis not present

## 2022-04-26 DIAGNOSIS — F410 Panic disorder [episodic paroxysmal anxiety] without agoraphobia: Secondary | ICD-10-CM | POA: Diagnosis not present

## 2022-05-02 DIAGNOSIS — F3181 Bipolar II disorder: Secondary | ICD-10-CM | POA: Diagnosis not present

## 2022-05-03 DIAGNOSIS — F3181 Bipolar II disorder: Secondary | ICD-10-CM | POA: Diagnosis not present

## 2022-05-05 DIAGNOSIS — G4733 Obstructive sleep apnea (adult) (pediatric): Secondary | ICD-10-CM | POA: Diagnosis not present

## 2022-05-08 DIAGNOSIS — F3181 Bipolar II disorder: Secondary | ICD-10-CM | POA: Diagnosis not present

## 2022-05-08 DIAGNOSIS — F410 Panic disorder [episodic paroxysmal anxiety] without agoraphobia: Secondary | ICD-10-CM | POA: Diagnosis not present

## 2022-05-15 DIAGNOSIS — F410 Panic disorder [episodic paroxysmal anxiety] without agoraphobia: Secondary | ICD-10-CM | POA: Diagnosis not present

## 2022-05-15 DIAGNOSIS — F3181 Bipolar II disorder: Secondary | ICD-10-CM | POA: Diagnosis not present

## 2022-05-17 DIAGNOSIS — F410 Panic disorder [episodic paroxysmal anxiety] without agoraphobia: Secondary | ICD-10-CM | POA: Diagnosis not present

## 2022-05-17 DIAGNOSIS — F3181 Bipolar II disorder: Secondary | ICD-10-CM | POA: Diagnosis not present

## 2022-05-22 DIAGNOSIS — F410 Panic disorder [episodic paroxysmal anxiety] without agoraphobia: Secondary | ICD-10-CM | POA: Diagnosis not present

## 2022-05-22 DIAGNOSIS — F3181 Bipolar II disorder: Secondary | ICD-10-CM | POA: Diagnosis not present

## 2022-05-24 DIAGNOSIS — F3181 Bipolar II disorder: Secondary | ICD-10-CM | POA: Diagnosis not present

## 2022-05-24 DIAGNOSIS — F410 Panic disorder [episodic paroxysmal anxiety] without agoraphobia: Secondary | ICD-10-CM | POA: Diagnosis not present

## 2022-05-28 ENCOUNTER — Encounter: Payer: Self-pay | Admitting: Pulmonary Disease

## 2022-05-28 ENCOUNTER — Ambulatory Visit (INDEPENDENT_AMBULATORY_CARE_PROVIDER_SITE_OTHER): Payer: BC Managed Care – PPO | Admitting: Pulmonary Disease

## 2022-05-28 VITALS — BP 112/78 | HR 83 | Temp 97.7°F | Ht 70.0 in | Wt 210.0 lb

## 2022-05-28 DIAGNOSIS — G4733 Obstructive sleep apnea (adult) (pediatric): Secondary | ICD-10-CM

## 2022-05-28 MED ORDER — ZOLPIDEM TARTRATE 10 MG PO TABS: 10.0000 mg | ORAL_TABLET | Freq: Every evening | ORAL | 1 refills | Status: DC | PRN

## 2022-05-28 NOTE — Patient Instructions (Addendum)
Prescription for zolpidem sent to pharmacy  Do not use zolpidem and trazodone together  Multiple medications that can cause may help insomnia may cause sleepiness, fatigue and poor energy   We will try and obtain a copy of the sleep study  We will contact [employer] to try and get a compliance  We will place a referral to ENT-Dr. Redmond Baseman for evaluation for other intervention for sleep disordered breathing  Interventions for sleep disordered breathing hopefully will treat sleep apnea, still have to work on other factors that may be affecting your sleep and energy levels  Follow-up in about 4 weeks

## 2022-05-28 NOTE — Progress Notes (Signed)
Alexander Harrington    [id number]    10/11/1990  Primary Care Physician:Copland, Gay Filler, MD  Referring Physician: Darreld Mclean, [address] [place],  [place] [address]  Chief complaint:    Severe sleep apnea, nonrestorative sleep, difficulty tolerating CPAP  HPI:  Patient was diagnosed with severe obstructive sleep apnea about December 2022 Has been trying CPAP and not tolerating CPAP Feels the air pressure is too much does not tolerate the nasal masks  Does not feel rested  He does have a history of major depressive disorder, mood disorder, bipolar disorder  Admits to difficulty falling asleep, multiple awakenings, not feeling rested, fatigue and chronic exhaustion  Continues to have frequent insomnia despite trying different agents including cubic, trazodone, Lunesta, zolpidem appears to have worked a little bit better in the past  Usually tries to go to bed between 11 and 1 AM may take several hours to fall asleep Tries to get out of bed between 8 and 9  Admits to snoring, frequent headaches Dryness of his mouth in the mornings  Sleep quality remains poor overall  He has lost about 50 pounds in the last year  Outpatient Encounter Medications as of 05/28/2022  Medication Sig   Cholecalciferol (VITAMIN D3) 1.25 MG (50000 UT) CAPS Take by mouth once a week.   clonazePAM (KLONOPIN) 1 MG tablet Take 1 mg by mouth 3 (three) times daily as needed.   hydrOXYzine (ATARAX) 10 MG tablet Take 10 mg by mouth 3 (three) times daily as needed.   lithium carbonate (LITHOBID) 300 MG CR tablet Take 300 mg by mouth 2 (two) times daily.   propranolol ER (INDERAL LA) 60 MG 24 hr capsule Take 60 mg by mouth daily.   rosuvastatin (CRESTOR) 5 MG tablet TAKE 1 TABLET(5 MG) BY MOUTH DAILY   tiZANidine (ZANAFLEX) 4 MG tablet Take 4 mg by mouth 2 (two) times daily.   vortioxetine HBr (TRINTELLIX) 5 MG TABS tablet Take 15 mg by mouth daily.   traZODone (DESYREL) 50  MG tablet Take 50-150 mg by mouth at bedtime as needed. (Patient not taking: Reported on 05/28/2022)   VRAYLAR 1.5 MG capsule Take 1.5 mg by mouth. (Patient not taking: Reported on 05/28/2022)   No facility-administered encounter medications on file as of 05/28/2022.    Allergies as of 05/28/2022 - Review Complete 05/28/2022  Allergen Reaction Noted   Amphetamines Other (See Comments) 04/11/2012   Benzodiazepines Other (See Comments) 04/11/2012   Codeine Rash 02/21/2011   Crestor [rosuvastatin] Other (See Comments) 06/08/2020    Past Medical History:  Diagnosis Date   Anxiety    Dr. Toy Care   Bipolar 2 disorder [facility])    Depression     No past surgical history on file.  Family History  Problem Relation Age of Onset   Arthritis Mother    Mental illness Mother    Depression Mother    Anxiety disorder Mother    Drug abuse Maternal Grandfather    Alcohol abuse Maternal Grandfather    Drug abuse Maternal Grandmother    Alcohol abuse Maternal Grandmother     Social History   Socioeconomic History   Marital status: Single    Spouse name: Not on file   Number of children: Not on file   Years of education: Not on file   Highest education level: Not on file  Occupational History   Occupation: student  Tobacco Use   Smoking  status: Never   Smokeless tobacco: Never   Tobacco comments:    Patient does vape occasionally.  No nicotine  Vaping Use   Vaping Use: Never used  Substance and Sexual Activity   Alcohol use: Yes    Alcohol/week: 5.0 - 6.0 standard drinks of alcohol    Types: 5 - 6 Cans of beer per week    Comment: once a week or every other week   Drug use: Yes    Frequency: 1.0 times per week    Types: Marijuana    Comment: Occasional use   Sexual activity: Not Currently    Birth control/protection: Condom  Other Topics Concern   Not on file  Social History Narrative   Caffienated drinks-no   Seat belt use often-yes   Regular Exercise-no   Smoke alarm in the  home-yes   Firearms/guns in the home-no   History of physical abuse-no      04/08/2013 AHW Kasandra Knudsen was born in [place], [place], and grew up in [place], [place]. 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 He denies any legal problems. He reports that he is spiritual but acting not stick. His hobbies include watching movies, reading, and writing. His social support system consists of his father, mother, friends, and cousins. 04/08/2013 AHW            Social Determinants of Health   Financial Resource Strain: Low Risk  (07/26/2017)   Overall Financial Resource Strain (CARDIA)    Difficulty of Paying Living Expenses: Not hard at all  Food Insecurity: No Food Insecurity (07/26/2017)   Hunger Vital Sign    Worried About Running Out of Food in the Last Year: Never true    Ran Out of Food in the Last Year: Never true  Transportation Needs: No Transportation Needs (07/26/2017)   PRAPARE - Hydrologist (Medical): No    Lack of Transportation (Non-Medical): No  Physical Activity: Unknown (07/26/2017)   Exercise Vital Sign    Days of Exercise per Week: 0 days    Minutes of Exercise per Session: Not on file  Stress: Stress Concern Present (07/26/2017)   [place]    Feeling of Stress : Very much  Social Connections: Moderately Isolated (07/26/2017)   Social Connection and Isolation Panel [NHANES]    Frequency of Communication with Friends and Family: More than three times a week    Frequency of Social Gatherings with Friends and Family: Twice a week    Attends Religious Services: Never    Marine scientist or Organizations: No    Attends Archivist Meetings: Never    Marital Status: Never married  Intimate Partner Violence: Not At  Risk (07/26/2017)   Humiliation, Afraid, Rape, and Kick questionnaire    Fear of Current or Ex-Partner: No    Emotionally Abused: No    Physically Abused: No    Sexually Abused: No    Review of Systems  Constitutional:  Positive for fatigue.  Respiratory:  Positive for apnea.   Psychiatric/Behavioral:  Positive for sleep disturbance.     Vitals:   05/28/22 1550  BP: 112/78  Pulse: 83  Temp: 97.7 F (36.5 C)  SpO2: 98%     Physical Exam Constitutional:  Appearance: He is obese.  HENT:     Head: Normocephalic.     Mouth/Throat:     Mouth: Mucous membranes are moist.     Comments: Crowded oropharynx Mallampati 3 Eyes:     Pupils: Pupils are equal, round, and reactive to light.  Cardiovascular:     Rate and Rhythm: Normal rate and regular rhythm.     Heart sounds: No murmur heard.    No friction rub.  Pulmonary:     Effort: No respiratory distress.     Breath sounds: No stridor. No wheezing or rhonchi.  Musculoskeletal:     Cervical back: No rigidity or tenderness.  Neurological:     Mental Status: He is alert.  Psychiatric:        Mood and Affect: Mood normal.    Epworth of 3  Data Reviewed: Sleep study not available but reportedly showing severe obstructive sleep apnea  Assessment:  Patient with severe obstructive sleep apnea not tolerating CPAP therapy  Nonrestorative sleep  Daytime fatigue  History of bipolar disorder  Restless sleep  Sleep onset and sleep maintenance insomnia with failure of multiple agents over time  Plan/Recommendations:  He is interested in other treatment options for sleep disordered breathing and we did talk about the inspire device, other surgical options for treating sleep disordered breathing also discussed  We will make a referral to ENT for evaluation  I did discuss with him that interventions to treat sleep disordered breathing will not necessarily resolve mood disorder, although may help.  Need to be on optimal  medications to treat his insomnia  Continue weight loss efforts  Avoid using multiple agents that may have respiratory depressant effect at the same time With prescription for zolpidem, I did encourage him to stay off trazodone.  Graded exercises as tolerated  We will get a copy of his sleep study  We will obtain compliance data from [employer]  Follow-up visit in about 4 weeks  Patient encouraged to call with significant concerns  Sherrilyn Rist MD [facility] 05/28/2022, 4:01 PM  CC: Copland, Gay Filler, MD

## 2022-05-29 DIAGNOSIS — F410 Panic disorder [episodic paroxysmal anxiety] without agoraphobia: Secondary | ICD-10-CM | POA: Diagnosis not present

## 2022-05-29 DIAGNOSIS — F3181 Bipolar II disorder: Secondary | ICD-10-CM | POA: Diagnosis not present

## 2022-05-31 ENCOUNTER — Other Ambulatory Visit: Payer: Self-pay | Admitting: Family Medicine

## 2022-05-31 DIAGNOSIS — E785 Hyperlipidemia, unspecified: Secondary | ICD-10-CM

## 2022-06-01 NOTE — Patient Instructions (Incomplete)
Good to see you again today - I am sorry you are having such a hard time I will be in touch with your labs asap Flu shot today I do think ENT may have some options for your sleep apnea- having your tonsils removed may be helpful Let me know what else I can do to help If you like, please schedule and appt and I can shave that skin lesion off your arm

## 2022-06-01 NOTE — Progress Notes (Unsigned)
[place] at [facility] [address], [place], [place] [address] [phone]  Date:  06/04/2022   Name:  Alexander Harrington   DOB:  25-May-1991   MRN:  [id number]  PCP:  Darreld Mclean, MD    Chief Complaint: No chief complaint on file.   History of Present Illness:  Alexander Harrington is a 31 y.o. very pleasant male patient who presents with the following:  Following up today for concern of fatigue  Most recnet visit with myself in April of this year - history of mood disorder, hypothyroidism and hyperlipidemia, sleep apnea   Lab Results  Component Value Date   HGBA1C 5.0 07/26/2021   Lab Results  Component Value Date   TSH 0.88 11/08/2021     Patient Active Problem List   Diagnosis Date Noted   Hyperlipidemia 06/03/2020   Bipolar I disorder, most recent episode depressed ([place]) 08/07/2017    Class: Chronic   Hoarseness of voice 10/04/2015   Tachycardia 08/30/2015   Leukocytosis 02/08/2015   Other specified hypothyroidism 02/08/2015   MDD (major depressive disorder), recurrent episode, severe ([place]) 12/01/2013   Substance induced mood disorder ([place]) 04/11/2012    Class: Acute   Severe episode of recurrent major depressive disorder ([place]) 04/09/2012    Class: Acute   Keratosis pilaris 02/21/2011    Past Medical History:  Diagnosis Date   Anxiety    Dr. Toy Care   Bipolar 2 disorder [facility])    Depression     No past surgical history on file.  Social History   Tobacco Use   Smoking status: Never   Smokeless tobacco: Never   Tobacco comments:    Patient does vape occasionally.  No nicotine  Vaping Use   Vaping Use: Never used  Substance Use Topics   Alcohol use: Yes    Alcohol/week: 5.0 - 6.0 standard drinks of alcohol    Types: 5 - 6 Cans of beer per week    Comment: once a week or every other week   Drug use: Yes    Frequency: 1.0 times per week    Types: Marijuana    Comment: Occasional use    Family  History  Problem Relation Age of Onset   Arthritis Mother    Mental illness Mother    Depression Mother    Anxiety disorder Mother    Drug abuse Maternal Grandfather    Alcohol abuse Maternal Grandfather    Drug abuse Maternal Grandmother    Alcohol abuse Maternal Grandmother     Allergies  Allergen Reactions   Amphetamines Other (See Comments)    Caused depression that went away after stopping the amphetamine   Benzodiazepines Other (See Comments)    Got cloudy thinking, loss of memory, suicidal thinking on Klonopin.   Codeine Rash   Crestor [Rosuvastatin] Other (See Comments)    Myalgia    Medication list has been reviewed and updated.  Current Outpatient Medications on File Prior to Visit  Medication Sig Dispense Refill   Cholecalciferol (VITAMIN D3) 1.25 MG (50000 UT) CAPS Take by mouth once a week.     clonazePAM (KLONOPIN) 1 MG tablet Take 1 mg by mouth 3 (three) times daily as needed.     hydrOXYzine (ATARAX) 10 MG tablet Take 10 mg by mouth 3 (three) times daily as needed.     lithium carbonate (LITHOBID) 300 MG CR tablet Take 300 mg by mouth 2 (two) times daily.  propranolol ER (INDERAL LA) 60 MG 24 hr capsule Take 60 mg by mouth daily.     rosuvastatin (CRESTOR) 5 MG tablet TAKE 1 TABLET(5 MG) BY MOUTH DAILY 90 tablet 3   tiZANidine (ZANAFLEX) 4 MG tablet Take 4 mg by mouth 2 (two) times daily.     traZODone (DESYREL) 50 MG tablet Take 50-150 mg by mouth at bedtime as needed. (Patient not taking: Reported on 05/28/2022)     vortioxetine HBr (TRINTELLIX) 5 MG TABS tablet Take 15 mg by mouth daily.     VRAYLAR 1.5 MG capsule Take 1.5 mg by mouth. (Patient not taking: Reported on 05/28/2022)     zolpidem (AMBIEN) 10 MG tablet Take 1 tablet (10 mg total) by mouth at bedtime as needed for sleep. 30 tablet 1   No current facility-administered medications on file prior to visit.    Review of Systems:  As per HPI- otherwise negative.   Physical Examination: There  were no vitals filed for this visit. There were no vitals filed for this visit. There is no height or weight on file to calculate BMI. Ideal Body Weight:    GEN: no acute distress. HEENT: Atraumatic, Normocephalic.  Ears and Nose: No external deformity. CV: RRR, No M/G/R. No JVD. No thrill. No extra heart sounds. PULM: CTA B, no wheezes, crackles, rhonchi. No retractions. No resp. distress. No accessory muscle use. ABD: S, NT, ND, +BS. No rebound. No HSM. EXTR: No c/c/e PSYCH: Normally interactive. Conversant.    Assessment and Plan: ***  Signed Lamar Blinks, MD

## 2022-06-04 ENCOUNTER — Ambulatory Visit (INDEPENDENT_AMBULATORY_CARE_PROVIDER_SITE_OTHER): Payer: BC Managed Care – PPO | Admitting: Family Medicine

## 2022-06-04 VITALS — BP 132/80 | HR 88 | Temp 97.7°F | Resp 18 | Ht 70.0 in | Wt 215.8 lb

## 2022-06-04 DIAGNOSIS — Z5181 Encounter for therapeutic drug level monitoring: Secondary | ICD-10-CM

## 2022-06-04 DIAGNOSIS — Z131 Encounter for screening for diabetes mellitus: Secondary | ICD-10-CM | POA: Diagnosis not present

## 2022-06-04 DIAGNOSIS — R5383 Other fatigue: Secondary | ICD-10-CM

## 2022-06-04 DIAGNOSIS — E785 Hyperlipidemia, unspecified: Secondary | ICD-10-CM

## 2022-06-04 DIAGNOSIS — G4733 Obstructive sleep apnea (adult) (pediatric): Secondary | ICD-10-CM

## 2022-06-04 DIAGNOSIS — F39 Unspecified mood [affective] disorder: Secondary | ICD-10-CM

## 2022-06-04 DIAGNOSIS — Z23 Encounter for immunization: Secondary | ICD-10-CM | POA: Diagnosis not present

## 2022-06-04 DIAGNOSIS — E538 Deficiency of other specified B group vitamins: Secondary | ICD-10-CM

## 2022-06-04 DIAGNOSIS — E559 Vitamin D deficiency, unspecified: Secondary | ICD-10-CM | POA: Diagnosis not present

## 2022-06-05 DIAGNOSIS — F3181 Bipolar II disorder: Secondary | ICD-10-CM | POA: Diagnosis not present

## 2022-06-05 DIAGNOSIS — G4733 Obstructive sleep apnea (adult) (pediatric): Secondary | ICD-10-CM | POA: Diagnosis not present

## 2022-06-05 LAB — COMPREHENSIVE METABOLIC PANEL
ALT: 20 U/L (ref 0–53)
AST: 15 U/L (ref 0–37)
Albumin: 4.5 g/dL (ref 3.5–5.2)
Alkaline Phosphatase: 51 U/L (ref 39–117)
BUN: 9 mg/dL (ref 6–23)
CO2: 29 mEq/L (ref 19–32)
Calcium: 9.3 mg/dL (ref 8.4–10.5)
Chloride: 104 mEq/L (ref 96–112)
Creatinine, Ser: 0.85 mg/dL (ref 0.40–1.50)
GFR: 115.81 mL/min (ref >60.00)
Glucose, Bld: 76 mg/dL (ref 70–99)
Potassium: 3.8 mEq/L (ref 3.5–5.1)
Sodium: 139 mEq/L (ref 135–145)
Total Bilirubin: 0.4 mg/dL (ref 0.2–1.2)
Total Protein: 7.2 g/dL (ref 6.0–8.3)

## 2022-06-05 LAB — VITAMIN B12: Vitamin B-12: 200 pg/mL — ABNORMAL LOW (ref 211–911)

## 2022-06-05 LAB — LIPID PANEL
Cholesterol: 210 mg/dL — ABNORMAL HIGH (ref 0–200)
HDL: 41 mg/dL (ref >39.00)
NonHDL: 168.9
Total CHOL/HDL Ratio: 5
Triglycerides: 363 mg/dL — ABNORMAL HIGH (ref 0.0–149.0)
VLDL: 72.6 mg/dL — ABNORMAL HIGH (ref 0.0–40.0)

## 2022-06-05 LAB — TSH: TSH: 1.39 u[IU]/mL (ref 0.35–5.50)

## 2022-06-05 LAB — CBC
HCT: 42.6 % (ref 39.0–52.0)
Hemoglobin: 14.3 g/dL (ref 13.0–17.0)
MCHC: 33.6 g/dL (ref 30.0–36.0)
MCV: 83.1 fl (ref 78.0–100.0)
Platelets: 406 10*3/uL — ABNORMAL HIGH (ref 150.0–400.0)
RBC: 5.13 Mil/uL (ref 4.22–5.81)
RDW: 12.7 % (ref 11.5–15.5)
WBC: 9.9 10*3/uL (ref 4.0–10.5)

## 2022-06-05 LAB — LITHIUM LEVEL: Lithium Lvl: <0.3 mmol/L — ABNORMAL LOW (ref 0.6–1.2)

## 2022-06-05 LAB — HEMOGLOBIN A1C: Hgb A1c MFr Bld: 5 % (ref 4.6–6.5)

## 2022-06-05 LAB — LDL CHOLESTEROL, DIRECT: Direct LDL: 134 mg/dL

## 2022-06-05 LAB — VITAMIN D 25 HYDROXY (VIT D DEFICIENCY, FRACTURES): VITD: 17.94 ng/mL — ABNORMAL LOW (ref 30.00–100.00)

## 2022-06-06 ENCOUNTER — Encounter: Payer: Self-pay | Admitting: Family Medicine

## 2022-06-06 MED ORDER — VITAMIN D3 1.25 MG (50000 UT) PO CAPS: ORAL_CAPSULE | ORAL | 0 refills | Status: DC

## 2022-06-06 NOTE — Addendum Note (Signed)
Addended by: Lamar Blinks C on: 06/06/2022 05:54 AM   Modules accepted: Orders

## 2022-06-07 DIAGNOSIS — F3181 Bipolar II disorder: Secondary | ICD-10-CM | POA: Diagnosis not present

## 2022-06-12 DIAGNOSIS — F3181 Bipolar II disorder: Secondary | ICD-10-CM | POA: Diagnosis not present

## 2022-06-12 DIAGNOSIS — F410 Panic disorder [episodic paroxysmal anxiety] without agoraphobia: Secondary | ICD-10-CM | POA: Diagnosis not present

## 2022-06-19 DIAGNOSIS — F3181 Bipolar II disorder: Secondary | ICD-10-CM | POA: Diagnosis not present

## 2022-06-21 ENCOUNTER — Emergency Department (HOSPITAL_COMMUNITY)
Admission: EM | Admit: 2022-06-21 | Discharge: 2022-06-21 | Disposition: A | Discharge status: Home / Self Care | Payer: BC Managed Care – PPO | Attending: Emergency Medicine | Admitting: Emergency Medicine

## 2022-06-21 ENCOUNTER — Other Ambulatory Visit: Payer: Self-pay

## 2022-06-21 ENCOUNTER — Encounter (HOSPITAL_COMMUNITY): Payer: Self-pay

## 2022-06-21 DIAGNOSIS — S1191XA Laceration without foreign body of unspecified part of neck, initial encounter: Secondary | ICD-10-CM | POA: Diagnosis not present

## 2022-06-21 DIAGNOSIS — T1491XA Suicide attempt, initial encounter: Secondary | ICD-10-CM

## 2022-06-21 DIAGNOSIS — S51811A Laceration without foreign body of right forearm, initial encounter: Secondary | ICD-10-CM | POA: Diagnosis not present

## 2022-06-21 DIAGNOSIS — F3181 Bipolar II disorder: Secondary | ICD-10-CM | POA: Diagnosis not present

## 2022-06-21 DIAGNOSIS — X781XXA Intentional self-harm by knife, initial encounter: Secondary | ICD-10-CM | POA: Diagnosis not present

## 2022-06-21 DIAGNOSIS — S59911A Unspecified injury of right forearm, initial encounter: Secondary | ICD-10-CM | POA: Diagnosis not present

## 2022-06-21 DIAGNOSIS — F332 Major depressive disorder, recurrent severe without psychotic features: Secondary | ICD-10-CM | POA: Diagnosis present

## 2022-06-21 DIAGNOSIS — S41111A Laceration without foreign body of right upper arm, initial encounter: Secondary | ICD-10-CM

## 2022-06-21 HISTORY — DX: Attention-deficit hyperactivity disorder, unspecified type: F90.9

## 2022-06-21 LAB — CBC WITH DIFFERENTIAL/PLATELET
Abs Immature Granulocytes: 0.09 10*3/uL — ABNORMAL HIGH (ref 0.00–0.07)
Basophils Absolute: 0.1 10*3/uL (ref 0.0–0.1)
Basophils Relative: 1 %
Eosinophils Absolute: 0.1 10*3/uL (ref 0.0–0.5)
Eosinophils Relative: 0 %
HCT: 46.6 % (ref 39.0–52.0)
Hemoglobin: 15.5 g/dL (ref 13.0–17.0)
Immature Granulocytes: 1 %
Lymphocytes Relative: 14 %
Lymphs Abs: 2.1 10*3/uL (ref 0.7–4.0)
MCH: 27.7 pg (ref 26.0–34.0)
MCHC: 33.3 g/dL (ref 30.0–36.0)
MCV: 83.2 fL (ref 80.0–100.0)
Monocytes Absolute: 1 10*3/uL (ref 0.1–1.0)
Monocytes Relative: 6 %
Neutro Abs: 11.8 10*3/uL — ABNORMAL HIGH (ref 1.7–7.7)
Neutrophils Relative %: 78 %
Platelets: 420 10*3/uL — ABNORMAL HIGH (ref 150–400)
RBC: 5.6 MIL/uL (ref 4.22–5.81)
RDW: 12.3 % (ref 11.5–15.5)
WBC: 15 10*3/uL — ABNORMAL HIGH (ref 4.0–10.5)
nRBC: 0 % (ref 0.0–0.2)

## 2022-06-21 LAB — COMPREHENSIVE METABOLIC PANEL
ALT: 25 U/L (ref 0–44)
AST: 23 U/L (ref 15–41)
Albumin: 4 g/dL (ref 3.5–5.0)
Alkaline Phosphatase: 47 U/L (ref 38–126)
Anion gap: 8 (ref 5–15)
BUN: 14 mg/dL (ref 6–20)
CO2: 22 mmol/L (ref 22–32)
Calcium: 9.1 mg/dL (ref 8.9–10.3)
Chloride: 107 mmol/L (ref 98–111)
Creatinine, Ser: 1.03 mg/dL (ref 0.61–1.24)
GFR, Estimated: >60 mL/min (ref >60)
Glucose, Bld: 99 mg/dL (ref 70–99)
Potassium: 3.3 mmol/L — ABNORMAL LOW (ref 3.5–5.1)
Sodium: 137 mmol/L (ref 135–145)
Total Bilirubin: 1 mg/dL (ref 0.3–1.2)
Total Protein: 7.7 g/dL (ref 6.5–8.1)

## 2022-06-21 LAB — ETHANOL: Alcohol, Ethyl (B): <10 mg/dL (ref <10)

## 2022-06-21 MED ORDER — HYDROCODONE-ACETAMINOPHEN 5-325 MG PO TABS: 1.0000 | ORAL_TABLET | Freq: Once | ORAL | Status: DC

## 2022-06-21 MED ORDER — LIDOCAINE-EPINEPHRINE (PF) 2 %-1:200000 IJ SOLN
20.0000 mL | Freq: Once | INTRAMUSCULAR | Status: AC
  Administered 2022-06-21: 20 mL
  Filled 2022-06-21: qty 20

## 2022-06-21 NOTE — ED Provider Notes (Addendum)
[facility] DEPT Provider Note   CSN: [id number] Arrival date & time: 06/21/22  1450     History  Chief Complaint  Patient presents with   Laceration   Suicidal    Micheal Murad is a 31 y.o. male with a past medical history of bipolar 1 and severe depression presenting today with a laceration.  He reports that earlier today he was "in distress" and in an active self-harm cut his right wrist and neck.  Has a history of depression and self-harm.  Denies alcohol use or drug use.  No AVH.  Presents with his dad who says that he had to take the knife away from the patient.  Reports taking lithium and Trintellix.  Says that he may have "lapsed."  Tetanus up-to-date   Laceration      Home Medications Prior to Admission medications   Medication Sig Start Date End Date Taking? Authorizing Provider  Cholecalciferol (VITAMIN D3) 1.25 MG (50000 UT) CAPS Take 1 weekly for 12 weeks 06/06/22   Copland, Gay Filler, MD  clonazePAM (KLONOPIN) 1 MG tablet Take 1 mg by mouth 3 (three) times daily as needed. 05/25/20   [provider]  hydrOXYzine (ATARAX) 10 MG tablet Take 10 mg by mouth 3 (three) times daily as needed.    [provider]  lithium carbonate (LITHOBID) 300 MG CR tablet Take 300 mg by mouth 2 (two) times daily. 11/07/21   [provider]  propranolol ER (INDERAL LA) 60 MG 24 hr capsule Take 60 mg by mouth daily. 11/02/21   [provider]  rosuvastatin (CRESTOR) 5 MG tablet TAKE 1 TABLET(5 MG) BY MOUTH DAILY 05/31/22   Copland, Gay Filler, MD  tiZANidine (ZANAFLEX) 4 MG tablet Take 4 mg by mouth 2 (two) times daily. 11/07/21   [provider]  traZODone (DESYREL) 50 MG tablet Take 50-150 mg by mouth at bedtime as needed. 12/28/19   [provider]  vortioxetine HBr (TRINTELLIX) 5 MG TABS tablet Take 15 mg by mouth daily.    [provider]  VRAYLAR 1.5 MG capsule Take 1.5 mg by mouth. 10/22/21    [provider]  zolpidem (AMBIEN) 10 MG tablet Take 1 tablet (10 mg total) by mouth at bedtime as needed for sleep. 05/28/22 07/27/22  Laurin Coder, MD      Allergies    Amphetamines, Benzodiazepines, Codeine, and Crestor [rosuvastatin]    Review of Systems   Review of Systems  Physical Exam Updated Vital Signs BP (!) 128/98 (BP Location: Left Arm)   Pulse (!) 104   Temp 98.5 F (36.9 C) (Oral)   Resp 18   Ht '5\' 10"'$  (1.778 m)   Wt 97.9 kg   SpO2 99%   BMI 30.96 kg/m  Physical Exam Vitals and nursing note reviewed.  Constitutional:      Appearance: Normal appearance.  HENT:     Head: Normocephalic and atraumatic.  Eyes:     General: No scleral icterus.    Conjunctiva/sclera: Conjunctivae normal.  Pulmonary:     Effort: Pulmonary effort is normal. No respiratory distress.  Skin:    Findings: No rash.     Comments: 2.5 inch horizontal laceration to the right forearm, multiple other superficial scratches/lacerations.  1 cm laceration to the patient's neck, just below the chin  Neurological:     Mental Status: He is alert.  Psychiatric:        Mood and Affect: Mood normal.  ED Results / Procedures / Treatments   Labs (all labs ordered are listed, but only abnormal results are displayed) Labs Reviewed  COMPREHENSIVE METABOLIC PANEL  ETHANOL  RAPID URINE DRUG SCREEN, HOSP PERFORMED  CBC WITH DIFFERENTIAL/PLATELET    EKG None  Radiology No results found.  Procedures Procedures   Medications Ordered in ED Medications  HYDROcodone-acetaminophen (NORCO/VICODIN) 5-325 MG per tablet 1 tablet (has no administration in time range)  lidocaine-EPINEPHrine (XYLOCAINE W/EPI) 2 %-1:200000 (PF) injection 20 mL (has no administration in time range)    ED Course/ Medical Decision Making/ A&P Clinical Course as of 06/21/22 1616  Thu Jun 21, 2022  1603 Psychiatric team went and spoke with the patient.  They state that they will put a note in about their  recommendations after speaking to their team. [MR]    Clinical Course User Index [MR] Tymar Polyak, Cecilio Asper, PA-C                           Medical Decision Making Amount and/or Complexity of Data Reviewed Labs: ordered.  Risk Prescription drug management.   31 year old male presenting today due to lacerations after self-harm.  He understands that his lacerations will be repaired but he needs to stay to speak with our psychiatric team as well.  Medical clearance: Labs unremarkable  MDM/disposition: I spoke with the psychiatric team in person and they say that they are recommending in person treatment for this patient.  They are at the bedside making the patient and his father aware.  Patient will be moved to the hallway and laceration will be repaired in order for true medical clearance.  Final Clinical Impression(s) / ED Diagnoses Final diagnoses:  Suicidal behavior with attempted self-injury [facility])  Laceration of right upper extremity, initial encounter    Rx / DC Orders ED Discharge Orders     None      Inpatient per psych.    Druanne Bosques, [place], PA-C 06/21/22 1640   I was contacted via secure chat by behavioral team.  They say that after speaking with the family more they are going to discharge him.  Lindon Romp, NP is going to reverse patient's IVC.   Darliss Ridgel 06/21/22 1658    Valarie Merino, MD 06/21/22 2249

## 2022-06-21 NOTE — Progress Notes (Signed)
Patient denies SI, HI, AVH and is able to contract for safety. He demonstrated no overt evidence of psychosis or mania. Prior to discharge the patient, verbalized that he understood warning signs, triggers, and symptoms of worsening mental health and how to access emergency mental health care if they felt it was needed. Patient was instructed to call 911 or return to the emergency room if they experienced any concerning symptoms after discharge. Patient voiced understanding and agreed to this. Patient's father denies any concerns with the patient being discharged. He states that patient will be returning home with him.   IVC rescinded by this provider.

## 2022-06-21 NOTE — Discharge Summary (Signed)
[facility] Psych ED Discharge  06/21/2022 4:53 PM Vernie Piet  MRN:  [id number]  Principal Problem: <principal problem not specified> Discharge Diagnoses: Active Problems:   MDD (major depressive disorder), recurrent episode, severe (HCC)  Clinical Impression:  Final diagnoses:  Suicidal behavior with attempted self-injury ([place])  Laceration of right upper extremity, initial encounter   ED Assessment Time Calculation: Start Time: 1600 Stop Time: 1645 Total Time in Minutes (Assessment Completion): 69   Subjective: 31 year old male with a past medical history of bipolar 1 and severe depression presenting today with a laceration.  He reports that earlier today he was "in distress" and in an active self-harm cut his right wrist and neck. He stated that he was "feeling bad, physically ran down and depressed". He states his appetite is ok, but his sleeping is poor he states he stays up all day, no naps and then doesn't sleep until about 0400 am and then gets about 5- 6 hrs avg sleep. He is also supposed to wear a CPAP which is bothersome to him and he doesn't wear it, he has an appointment with a ENT but its not for a couple months.  Has a history of depression and self-harm, he stated when his family argues, it makes him want to cut, but not to the point of death just to harm, patient stated he has been self harming for a long time.Marland Kitchen He has had a SI attempt before, around age 59 in college he attempted to OD on unknown pills. Denies alcohol use, says he does take Delta 8 at times (about twice a week). No AVH. Patient is also receiving Ketamine treatments with Dr. Toy Care and has received 7 treatments since Oct. 2023, he feels it is slowly helping with suicidal thoughts but not with anxiety and sleep. Patient has been in 2 partial programs at [facility] here in [place] and also has been admitted to their residential treatment facility in [place], this year 2023, and also admitted to [facility]  [facility]. Patient father who is present with patient, stated that patient mother has been recently diagnosed with cancer and has been homebound, which is an adjustment and patient also moved out on his own in May 2023, and it has been a hard adjustment. Patient states he also feels his current medications are not helping his moods, he feels he is treatment resistant to medications that is why Ketamine was offered. Patient wants help with finding a permanent psychiatrist. Patient dad is very supportive and offers to help patient be safe and is allowing patient to stay at home with him for safety. Father Franki Stemen [phone].   Patient is alert and oriented x 4, pleasant, and cooperative. Speech is clear and coherent, normal volume and pace. Reports mood as depressed and affect appears flat. Denies current SI. Has consistently denied SI since arrival at ED, states he just self harms to feel better. Denies HI and AVH. No indication that he is responding to internal stimuli. No evidence that patient is of imminent risk to self or others.    Past Psychiatric History:  [facility] 2013 [facility] 2023 [place] 2023    Past Medical History:  Past Medical History:  Diagnosis Date   ADHD    Anxiety    Dr. Toy Care   Bipolar 2 disorder [facility])    Depression     Past Surgical History:  Procedure Laterality Date   adhd     Family History:  Family History  Problem Relation Age of Onset   Arthritis Mother    Mental illness Mother    Depression Mother    Anxiety disorder Mother    Drug abuse Maternal Grandfather    Alcohol abuse Maternal Grandfather    Drug abuse Maternal Grandmother    Alcohol abuse Maternal Grandmother    Social History:  Social History   Substance and Sexual Activity  Alcohol Use Yes   Alcohol/week: 5.0 - 6.0 standard drinks of alcohol   Types: 5 - 6 Cans of beer per week   Comment: once a week or every other week     Social  History   Substance and Sexual Activity  Drug Use Yes   Frequency: 1.0 times per week   Types: Marijuana   Comment: Occasional use    Social History   Socioeconomic History   Marital status: Single    Spouse name: Not on file   Number of children: Not on file   Years of education: Not on file   Highest education level: Not on file  Occupational History   Occupation: student  Tobacco Use   Smoking status: Never   Smokeless tobacco: Never   Tobacco comments:    Patient does vape occasionally.  No nicotine  Vaping Use   Vaping Use: Never used  Substance and Sexual Activity   Alcohol use: Yes    Alcohol/week: 5.0 - 6.0 standard drinks of alcohol    Types: 5 - 6 Cans of beer per week    Comment: once a week or every other week   Drug use: Yes    Frequency: 1.0 times per week    Types: Marijuana    Comment: Occasional use   Sexual activity: Not Currently    Birth control/protection: Condom  Other Topics Concern   Not on file  Social History Narrative   Caffienated drinks-no   Seat belt use often-yes   Regular Exercise-no   Smoke alarm in the home-yes   Firearms/guns in the home-no   History of physical abuse-no      04/08/2013 AHW Kasandra Knudsen was born in [place], [place], and grew up in [place], [place]. 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 He denies any legal problems. He reports that he is spiritual but acting not stick. His hobbies include watching movies, reading, and writing. His social support system consists of his father, mother, friends, and cousins. 04/08/2013 AHW            Social Determinants of Health   Financial Resource Strain: Low Risk  (07/26/2017)   Overall Financial Resource Strain (CARDIA)    Difficulty of Paying Living Expenses: Not hard at all  Food Insecurity: No Food Insecurity  (07/26/2017)   Hunger Vital Sign    Worried About Running Out of Food in the Last Year: Never true    Ran Out of Food in the Last Year: Never true  Transportation Needs: No Transportation Needs (07/26/2017)   PRAPARE - Hydrologist (Medical): No    Lack of Transportation (Non-Medical): No  Physical Activity: Unknown (07/26/2017)   Exercise Vital Sign    Days of Exercise per Week: 0 days    Minutes of Exercise per Session: Not on file  Stress: Stress Concern Present (07/26/2017)   [place] -  Occupational Stress Questionnaire    Feeling of Stress : Very much  Social Connections: Moderately Isolated (07/26/2017)   Social Connection and Isolation Panel [NHANES]    Frequency of Communication with Friends and Family: More than three times a week    Frequency of Social Gatherings with Friends and Family: Twice a week    Attends Religious Services: Never    Marine scientist or Organizations: No    Attends Music therapist: Never    Marital Status: Never married    Tobacco Cessation:  N/A, patient does not currently use tobacco products  Current Medications: Current Facility-Administered Medications  Medication Dose Route Frequency Provider Last Rate Last Admin   HYDROcodone-acetaminophen (NORCO/VICODIN) 5-325 MG per tablet 1 tablet  1 tablet Oral Once Redwine, Madison A, PA-C       Current Outpatient Medications  Medication Sig Dispense Refill   Cholecalciferol (VITAMIN D3) 1.25 MG (50000 UT) CAPS Take 1 weekly for 12 weeks 12 capsule 0   clonazePAM (KLONOPIN) 1 MG tablet Take 1 mg by mouth 3 (three) times daily as needed.     hydrOXYzine (ATARAX) 10 MG tablet Take 10 mg by mouth 3 (three) times daily as needed.     lithium carbonate (LITHOBID) 300 MG CR tablet Take 300 mg by mouth 2 (two) times daily.     propranolol ER (INDERAL LA) 60 MG 24 hr capsule Take 60 mg by mouth daily.     rosuvastatin (CRESTOR) 5 MG tablet  TAKE 1 TABLET(5 MG) BY MOUTH DAILY 90 tablet 3   tiZANidine (ZANAFLEX) 4 MG tablet Take 4 mg by mouth 2 (two) times daily.     traZODone (DESYREL) 50 MG tablet Take 50-150 mg by mouth at bedtime as needed.     vortioxetine HBr (TRINTELLIX) 5 MG TABS tablet Take 15 mg by mouth daily.     VRAYLAR 1.5 MG capsule Take 1.5 mg by mouth.     zolpidem (AMBIEN) 10 MG tablet Take 1 tablet (10 mg total) by mouth at bedtime as needed for sleep. 30 tablet 1   PTA Medications: (Not in a hospital admission)   [place] Scale:  [facility] ED from 06/21/2022 in [place] DEPT  C-SSRS RISK CATEGORY No Risk       Musculoskeletal: Strength & Muscle Tone: within normal limits Gait & Station: normal Patient leans: N/A  Psychiatric Specialty Exam: Presentation  General Appearance: Appropriate for Environment  Eye Contact:Fair  Speech:Clear and Coherent  Speech Volume:Normal  Handedness:Right   Mood and Affect  Mood:Depressed  Affect:Flat   Thought Process  Thought Processes:Coherent  Descriptions of Associations:Intact  Orientation:Full (Time, Place and Person)  Thought Content:WDL  History of Schizophrenia/Schizoaffective disorder:No data recorded Duration of Psychotic Symptoms:No data recorded Hallucinations:Hallucinations: None  Ideas of Reference:None  Suicidal Thoughts:Suicidal Thoughts: No  Homicidal Thoughts:Homicidal Thoughts: No   Sensorium  Memory:Immediate Fair  Judgment:Fair  Insight:Fair   Executive Functions  Concentration:Good  Attention Span:Good  [place] of Knowledge:Good  Language:Good   Psychomotor Activity  Psychomotor Activity:Psychomotor Activity: Normal   Assets  Assets:Communication Skills; Desire for Improvement; Financial Resources/Insurance; Housing; Social Support   Sleep  Sleep:Sleep: Poor Number of Hours of Sleep: 6 (5-7 avg amount of sleep)    Physical Exam: Physical  Exam ROS Blood pressure (!) 128/98, pulse (!) 104, temperature 98.5 F (36.9 C), temperature source Oral, resp. rate 18, height '5\' 10"'$  (1.778 m), weight 97.9 kg, SpO2 99 %. Body mass index is  30.96 kg/m.   Demographic Factors:  Male  Loss Factors: Mother recently diagnosed with cancer  Historical Factors: Prior suicide attempts  Risk Reduction Factors:   Positive social support and Positive therapeutic relationship  Continued Clinical Symptoms:  Severe Anxiety and/or Agitation Bipolar Disorder:   Depressive phase Depression:   Hopelessness Impulsivity More than one psychiatric diagnosis  Cognitive Features That Contribute To Risk:  None    Suicide Risk:  Minimal: No identifiable suicidal ideation.  Patients presenting with no risk factors but with morbid ruminations; may be classified as minimal risk based on the severity of the depressive symptoms   Medical Decision Making: Patient is alert and oriented x 4, pleasant, and cooperative. Speech is clear and coherent, normal volume and pace. Reports mood as depressed and affect appears flat. Denies current SI. Has consistently denied SI since arrival at ED, states he just self harms to feel better. Denies HI and AVH. No indication that he is responding to internal stimuli. No evidence that patient is of imminent risk to self or others. Discussed methods to reduce the risk of self-injury or suicide attempts: Frequent conversations regarding unsafe thoughts. Remove all significant sharps. Remove all firearms. Remove all medications, including over-the-counter meds. Consider lockbox for medications and having a responsible person dispense medications until patient has strengthened coping skills. Room checks for sharps or other harmful objects. Secure all chemical substances that can be ingested or inhaled.  Please refrain from using alcohol or illicit substances, as they can affect your mood and can cause depression, anxiety or other  concerning symptoms. Informed patient of outpatient resources and sent a secured chat to Dellia Nims for IOP/PHP information.      Problem 1: Bipolar disorder   Disposition: No evidence of imminent risk to self or others at present.   Patient does not meet criteria for psychiatric inpatient admission. Supportive therapy provided about ongoing stressors. Refer to IOP. Discussed crisis plan, support from social network, calling 911, coming to the Emergency Department, and calling Suicide Hotline.   Josue Falconi MOTLEY-MANGRUM, PMHNP 06/21/2022, 4:53 PM

## 2022-06-21 NOTE — ED Triage Notes (Signed)
Patient has a 1/2 inch laceration  to the upper anterior neck and 3 lacerations to the right  forearm., each approx  3 inches long. Patient used a Audiological scientist. Patient states he was doing self harm, but does not thinks it was to the level of suicide.

## 2022-06-21 NOTE — ED Provider Notes (Signed)
  [place] DEPT Provider Procedure Note   CSN: [id number] Arrival date & time: 06/21/22  1450  Procedures .Marland KitchenLaceration Repair  Date/Time: 06/21/2022 5:22 PM  Performed by: Valarie Merino, MD Authorized by: Valarie Merino, MD   Consent:    Consent obtained:  Verbal   Consent given by:  Patient and parent   Risks, benefits, and alternatives were discussed: yes     Risks discussed:  Infection, need for additional repair, nerve damage, poor wound healing, poor cosmetic result, pain, retained foreign body, tendon damage and vascular damage   Alternatives discussed:  No treatment Universal protocol:    Immediately prior to procedure, a time out was called: yes     Patient identity confirmed:  Verbally with patient Anesthesia:    Anesthesia method:  Local infiltration   Local anesthetic:  Lidocaine 2% WITH epi Laceration details:    Location:  Shoulder/arm   Shoulder/arm location:  R lower arm   Length (cm):  4 Pre-procedure details:    Preparation:  Patient was prepped and draped in usual sterile fashion Exploration:    Limited defect created (wound extended): no     Hemostasis achieved with:  Direct pressure   Imaging outcome: foreign body not noted     Wound exploration: wound explored through full range of motion and entire depth of wound visualized     Contaminated: no   Treatment:    Area cleansed with:  Povidone-iodine and saline   Amount of cleaning:  Standard   Visualized foreign bodies/material removed: no     Debridement:  None   Undermining:  None   Scar revision: no   Skin repair:    Repair method:  Sutures   Suture size:  4-0   Suture material:  Prolene   Suture technique:  Running   Number of sutures:  10 Approximation:    Approximation:  Close Repair type:    Repair type:  Simple Post-procedure details:    Dressing:  Bulky dressing   Procedure completion:  Tolerated well, no immediate complications     Valarie Merino, MD 06/21/22 1724

## 2022-06-21 NOTE — Discharge Instructions (Signed)
  Discharge recommendations:  Patient is to take medications as prescribed. Please see information for follow-up appointment with psychiatry and therapy. Please follow up with your primary care provider for all medical related needs.   Therapy: We recommend that patient participate in individual therapy to address mental health concerns.  Atypical antipsychotics: If you are prescribed an atypical antipsychotic, it is recommended that your height, weight, BMI, blood pressure, fasting lipid panel, and fasting blood sugar be monitored by your outpatient providers.  Safety:  The patient should abstain from use of illicit substances/drugs and abuse of any medications. If symptoms worsen or do not continue to improve or if the patient becomes actively suicidal or homicidal then it is recommended that the patient return to the closest hospital emergency department, the [facility], or call 911 for further evaluation and treatment. National Suicide Prevention Lifeline 1-800-SUICIDE or 4081953389.  About 988 988 offers 24/7 access to trained crisis counselors who can help people experiencing mental health-related distress. People can call or text 988 or chat 988lifeline.org for themselves or if they are worried about a loved one who may need crisis support.  Crisis Mobile: [facility]:                     [phone] (for crisis response 24 hours a day) [place]:                                            3135544877

## 2022-06-22 ENCOUNTER — Telehealth (HOSPITAL_COMMUNITY): Payer: Self-pay | Admitting: Licensed Clinical Social Worker

## 2022-06-22 DIAGNOSIS — S51811D Laceration without foreign body of right forearm, subsequent encounter: Secondary | ICD-10-CM | POA: Diagnosis not present

## 2022-06-26 ENCOUNTER — Ambulatory Visit: Payer: BC Managed Care – PPO | Admitting: Pulmonary Disease

## 2022-06-26 DIAGNOSIS — F3181 Bipolar II disorder: Secondary | ICD-10-CM | POA: Diagnosis not present

## 2022-06-27 ENCOUNTER — Ambulatory Visit (INDEPENDENT_AMBULATORY_CARE_PROVIDER_SITE_OTHER): Payer: BC Managed Care – PPO | Admitting: Pulmonary Disease

## 2022-06-27 ENCOUNTER — Encounter: Payer: Self-pay | Admitting: Pulmonary Disease

## 2022-06-27 VITALS — BP 112/78 | HR 59 | Temp 97.9°F | Ht 70.0 in | Wt 216.4 lb

## 2022-06-27 DIAGNOSIS — G4733 Obstructive sleep apnea (adult) (pediatric): Secondary | ICD-10-CM | POA: Diagnosis not present

## 2022-06-27 MED ORDER — ZOLPIDEM TARTRATE ER 12.5 MG PO TBCR: 12.5000 mg | EXTENDED_RELEASE_TABLET | Freq: Every evening | ORAL | 0 refills | Status: DC | PRN

## 2022-06-27 NOTE — Progress Notes (Signed)
Alexander Harrington    [id number]    28-May-1991  Primary [facility] Physician:Copland, Gay Filler, MD  Referring Physician: Darreld [facility], [place] [place],  [place] [address]  Chief complaint:    Severe sleep apnea, nonrestorative sleep, difficulty tolerating CPAP  HPI:  Patient was diagnosed with severe obstructive sleep apnea about December 2022 Has been trying CPAP and not tolerating CPAP  Still not able to tolerate CPAP Has not been using it  He does not feel well rested Has significant insomnia and Ambien is helping a little bit  He does have a history of major depressive disorder, mood disorder, bipolar disorder Difficulty falling asleep, multiple awakenings, not feeling rested, fatigue and chronic exhaustion  He has an appointment to follow-up with ENT for options of treatment, will consider an inspire device as an option of treatment  He is aware that this will not treat his insomnia  Multiple different agents in the past have not helped his insomnia but Ambien is helping a little bit at present Usually tries to go to bed between 11 and 1 AM may take several hours to fall asleep Tries to get out of bed between 8 and 9  Admits to snoring, frequent headaches Dryness of his mouth in the mornings  Sleep quality remains poor overall  He has lost about 50 pounds in the last year  Outpatient Encounter Medications as of 06/27/2022  Medication Sig   Cholecalciferol (VITAMIN D3) 1.25 MG (50000 UT) CAPS Take 1 weekly for 12 weeks   clonazePAM (KLONOPIN) 1 MG tablet Take 1 mg by mouth 3 (three) times daily as needed.   hydrOXYzine (ATARAX) 10 MG tablet Take 10 mg by mouth 3 (three) times daily as needed.   lithium carbonate (LITHOBID) 300 MG CR tablet Take 300 mg by mouth 2 (two) times daily.   propranolol ER (INDERAL LA) 60 MG 24 hr capsule Take 60 mg by mouth daily.   rosuvastatin (CRESTOR) 5 MG tablet TAKE 1 TABLET(5 MG) BY MOUTH DAILY    tiZANidine (ZANAFLEX) 4 MG tablet Take 4 mg by mouth 2 (two) times daily.   vortioxetine HBr (TRINTELLIX) 5 MG TABS tablet Take 15 mg by mouth daily.   zolpidem (AMBIEN) 10 MG tablet Take 1 tablet (10 mg total) by mouth at bedtime as needed for sleep.   traZODone (DESYREL) 50 MG tablet Take 50-150 mg by mouth at bedtime as needed. (Patient not taking: Reported on 06/27/2022)   VRAYLAR 1.5 MG capsule Take 1.5 mg by mouth. (Patient not taking: Reported on 06/27/2022)   No facility-administered encounter medications on file as of 06/27/2022.    Allergies as of 06/27/2022 - Review Complete 06/27/2022  Allergen Reaction Noted   Amphetamines Other (See Comments) 04/11/2012   Benzodiazepines Other (See Comments) 04/11/2012   Codeine Rash 02/21/2011   Crestor [rosuvastatin] Other (See Comments) 06/08/2020    Past Medical History:  Diagnosis Date   ADHD    Anxiety    Dr. Toy [facility]   Bipolar 2 disorder [facility])    Depression     Past Surgical History:  Procedure Laterality Date   adhd      Family History  Problem Relation Age of Onset   Arthritis Mother    Mental illness Mother    Depression Mother    Anxiety disorder Mother    Drug abuse Maternal Grandfather    Alcohol abuse Maternal Grandfather    Drug abuse  Maternal Grandmother    Alcohol abuse Maternal Grandmother     Social History   Socioeconomic History   Marital status: Single    Spouse name: Not on file   Number of children: Not on file   Years of education: Not on file   Highest education level: Not on file  Occupational History   Occupation: student  Tobacco Use   Smoking status: Never   Smokeless tobacco: Never   Tobacco comments:    Patient does vape occasionally.  No nicotine  Vaping Use   Vaping Use: Never used  Substance and Sexual Activity   Alcohol use: Yes    Alcohol/week: 5.0 - 6.0 standard drinks of alcohol    Types: 5 - 6 Cans of beer per week    Comment: once a week or every other week   Drug use:  Yes    Frequency: 1.0 times per week    Types: Marijuana    Comment: Occasional use   Sexual activity: Not Currently    Birth control/protection: Condom  Other Topics Concern   Not on file  Social History Narrative   Caffienated drinks-no   Seat belt use often-yes   Regular Exercise-no   Smoke alarm in the home-yes   Firearms/guns in the home-no   History of physical abuse-no      04/08/2013 Alexander Harrington was born in [place], [place], and grew up in [place], [place]. 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 He denies any legal problems. He reports that he is spiritual but acting not stick. His hobbies include watching movies, reading, and writing. His social support system consists of his father, mother, friends, and cousins. 04/08/2013 Alexander            Social Determinants of Health   Financial Resource Strain: Low Risk  (07/26/2017)   Overall Financial Resource Strain (CARDIA)    Difficulty of Paying Living Expenses: Not hard at all  Food Insecurity: No Food Insecurity (07/26/2017)   Hunger Vital Sign    Worried About Running Out of Food in the Last Year: Never true    Ran Out of Food in the Last Year: Never true  Transportation Needs: No Transportation Needs (07/26/2017)   PRAPARE - Hydrologist (Medical): No    Lack of Transportation (Non-Medical): No  Physical Activity: Unknown (07/26/2017)   Exercise Vital Sign    Days of Exercise per Week: 0 days    Minutes of Exercise per Session: Not on file  Stress: Stress Concern Present (07/26/2017)   [place]    Feeling of Stress : Very much  Social Connections: Moderately Isolated (07/26/2017)   Social Connection and Isolation Panel [NHANES]    Frequency of Communication with Friends  and Family: More than three times a week    Frequency of Social Gatherings with Friends and Family: Twice a week    Attends Religious Services: Never    Marine scientist or Organizations: No    Attends Archivist Meetings: Never    Marital Status: Never married  Intimate Partner Violence: Not At Risk (07/26/2017)   Humiliation, Afraid, Rape, and Kick questionnaire    Fear of Current or Ex-Partner: No    Emotionally Abused: No    Physically  Abused: No    Sexually Abused: No    Review of Systems  Constitutional:  Positive for fatigue.  Respiratory:  Positive for apnea.   Psychiatric/Behavioral:  Positive for sleep disturbance.     Vitals:   06/27/22 1014  BP: 112/78  Pulse: (!) 59  Temp: 97.9 F (36.6 C)  SpO2: 98%     Physical Exam Constitutional:      Appearance: He is obese.  HENT:     Head: Normocephalic.     Mouth/Throat:     Mouth: Mucous membranes are moist.     Comments: Crowded oropharynx Mallampati 3 Eyes:     Pupils: Pupils are equal, round, and reactive to light.  Cardiovascular:     Rate and Rhythm: Normal rate and regular rhythm.     Heart sounds: No murmur heard.    No friction rub.  Pulmonary:     Effort: No respiratory distress.     Breath sounds: No stridor. No wheezing or rhonchi.  Musculoskeletal:     Cervical back: No rigidity or tenderness.  Neurological:     Mental Status: He is alert.  Psychiatric:        Mood and Affect: Mood normal.   Epworth of 3  Data Reviewed:  Sleep study showing severe obstructive sleep apnea  Assessment:  Severe obstructive sleep apnea Nonrestorative sleep  Daytime fatigue  History of bipolar disorder  Sleep onset and sleep maintenance insomnia -Ambien seems to be helping a little bit at present   Plan/Recommendations:  Further treatment options for sleep apnea was discussed -Has an appointment to see ENT for evaluation  He is aware that an inspire device will not treat his  insomnia  Ambien seems to be working okay at present We did discuss increasing the dose of Ambien from 10 mg to 12.5  Continue weight loss efforts  Encouraged to try and use his CPAP on a nightly basis so that we can get some compliance download and see whether the machine can be optimized  Follow-up in 8 weeks  Sherrilyn Rist MD [facility] 06/27/2022, 10:39 AM  CC: Copland, Gay Filler, MD

## 2022-06-27 NOTE — Patient Instructions (Signed)
I will see you in about 8 weeks  Continue Ambien Prescription sent in for 12.5 mg Ambien in place of 10  Follow-up with ENT in January -ENT will be able to discuss with you about options of treatment both including surgical options for your tonsils and possibility of an inspire device as an option of treatment  Try to get back to using CPAP as tolerated, given a download from his CPAP helps [place] troubleshoot whether it is a pressure issue  Call with significant concerns

## 2022-06-28 DIAGNOSIS — F410 Panic disorder [episodic paroxysmal anxiety] without agoraphobia: Secondary | ICD-10-CM | POA: Diagnosis not present

## 2022-06-28 DIAGNOSIS — F3181 Bipolar II disorder: Secondary | ICD-10-CM | POA: Diagnosis not present

## 2022-07-04 DIAGNOSIS — F3181 Bipolar II disorder: Secondary | ICD-10-CM | POA: Diagnosis not present

## 2022-07-05 DIAGNOSIS — F3181 Bipolar II disorder: Secondary | ICD-10-CM | POA: Diagnosis not present

## 2022-07-05 DIAGNOSIS — F410 Panic disorder [episodic paroxysmal anxiety] without agoraphobia: Secondary | ICD-10-CM | POA: Diagnosis not present

## 2022-07-12 DIAGNOSIS — F3181 Bipolar II disorder: Secondary | ICD-10-CM | POA: Diagnosis not present

## 2022-07-12 DIAGNOSIS — F410 Panic disorder [episodic paroxysmal anxiety] without agoraphobia: Secondary | ICD-10-CM | POA: Diagnosis not present

## 2022-07-13 ENCOUNTER — Encounter: Payer: Self-pay | Admitting: Family Medicine

## 2022-07-13 ENCOUNTER — Telehealth: Payer: Self-pay

## 2022-07-13 MED ORDER — NIRMATRELVIR/RITONAVIR (PAXLOVID)TABLET: 3.0000 | ORAL_TABLET | Freq: Two times a day (BID) | ORAL | 0 refills | Status: AC

## 2022-07-13 NOTE — Telephone Encounter (Signed)
See my chart message

## 2022-07-13 NOTE — Telephone Encounter (Signed)
Nurse Assessment Nurse: Lovena Le, RN, Santiago Glad Date/Time Eilene Ghazi Time): 07/12/2022 6:33:19 PM Confirm and document reason for call. If symptomatic, describe symptoms. ---Caller states he has had symptoms for a few days, last night he had sweats and chills, no fever, cough with thick yellow sputum. Tested Covid+ today via home test. Does the patient have any new or worsening symptoms? ---Yes Will a triage be completed? ---Yes Related visit to physician within the last 2 weeks? ---No Does the PT have any chronic conditions? (i.e. diabetes, asthma, this includes High risk factors for pregnancy, etc.) ---Yes List chronic conditions. ---sleep apnea with cpap Is this a behavioral health or substance abuse call? ---No Guidelines Guideline Title Affirmed Question Affirmed Notes Nurse Date/Time (Eastern Time) COVID-19 - Diagnosed or Suspected [1] COVID-19 infection suspected by caller or triager AND [2] mild symptoms (cough, fever, or others) AND [3] negative COVID-19 rapid test Joie Bimler 07/12/2022 6:40:55 PM PLEASE NOTE: All timestamps contained within this report are represented as [place] Standard Time. CONFIDENTIALTY NOTICE: This fax transmission is intended only for the addressee. It contains information that is legally privileged, confidential or otherwise protected from use or disclosure. If you are not the intended recipient, you are strictly prohibited from reviewing, disclosing, copying using or disseminating any of this information or taking any action in reliance on or regarding this information. If you have received this fax in error, please notify us immediately by telephone so that we can arrange for its return to [place]. Phone: [phone], Toll-Free: [phone], Fax: [phone] Page: 2 of 2 Call Id: [id number] [place]. Time Eilene Ghazi Time) Disposition Final User 07/12/2022 5:40:24 PM Send to [place], RN, Maudry Mayhew 07/12/2022 6:29:02 PM Attempt made -  message left Joie Bimler 07/12/2022 6:45:26 PM Call PCP when Office is Open Yes Lovena Le, RN, Santiago Glad Final Disposition 07/12/2022 6:45:26 PM Call PCP when Office is Open Yes Lovena Le, RN, York Pellant Disagree/Comply Comply Caller Understands Yes PreDisposition Did not know what to do Care Advice Given Per Guideline CALL PCP WHEN OFFICE IS OPEN: * COUGH DROPS: Over-the-counter cough drops can help a lot, especially for mild coughs. They soothe an irritated throat and remove the tickle sensation in the back of the throat. Cough drops are easy to carry with you. HUMIDIFIER: * If the air is dry, use a humidifier in the bedroom. * Treat fevers above 101 F (38.3 C). The goal of fever therapy is to bring the fever down to a comfortable level. Remember that fever medicine usually lowers fever 2 degrees F (1 - 1 1/2 degrees C). * STAY HOME A MINIMUM OF 5 DAYS: People with MILD COVID-19 can STOP HOME ISOLATION AFTER 5 DAYS if (1) fever has been gone for 24 hours (without using fever medicine) AND (2) symptoms are better. Continue to wear a well-fitted mask for a full 10 days when around others. * WEAR A MASK FOR 10 DAYS: Wear a well-fitted mask for 10 full days any time you are around others inside your home or in public. Do not go to places where you are unable to wear a mask. CALL BACK IF: * You become worse * Chest pain or difficulty breathing occurs CARE ADVICE given per COVID-19 - DIAGNOSED OR SUSPECTED (Adult) guideline. Comments User: Moishe Spice, RN Date/Time Eilene Ghazi Time): 07/12/2022 6:28:17 PM father states son is isolated in his room, attempted to call but went to vm

## 2022-07-13 NOTE — Telephone Encounter (Signed)
Called and sent mychart for more details.  Also spoke with his mom who is ill too

## 2022-07-14 DIAGNOSIS — S51811D Laceration without foreign body of right forearm, subsequent encounter: Secondary | ICD-10-CM | POA: Diagnosis not present

## 2022-07-14 DIAGNOSIS — Z4802 Encounter for removal of sutures: Secondary | ICD-10-CM | POA: Diagnosis not present

## 2022-07-19 DIAGNOSIS — F341 Dysthymic disorder: Secondary | ICD-10-CM | POA: Diagnosis not present

## 2022-07-19 DIAGNOSIS — F5101 Primary insomnia: Secondary | ICD-10-CM | POA: Diagnosis not present

## 2022-07-19 DIAGNOSIS — F411 Generalized anxiety disorder: Secondary | ICD-10-CM | POA: Diagnosis not present

## 2022-07-19 DIAGNOSIS — F331 Major depressive disorder, recurrent, moderate: Secondary | ICD-10-CM | POA: Diagnosis not present

## 2022-07-23 MED ORDER — DOXYCYCLINE HYCLATE 100 MG PO CAPS: 100.0000 mg | ORAL_CAPSULE | Freq: Two times a day (BID) | ORAL | 0 refills | Status: DC

## 2022-07-23 NOTE — Addendum Note (Signed)
Addended by: Lamar Blinks C on: 07/23/2022 06:44 PM   Modules accepted: Orders

## 2022-07-26 DIAGNOSIS — Z9989 Dependence on other enabling machines and devices: Secondary | ICD-10-CM | POA: Diagnosis not present

## 2022-07-26 DIAGNOSIS — G4733 Obstructive sleep apnea (adult) (pediatric): Secondary | ICD-10-CM | POA: Diagnosis not present

## 2022-07-26 DIAGNOSIS — Z6830 Body mass index (BMI) 30.0-30.9, adult: Secondary | ICD-10-CM | POA: Diagnosis not present

## 2022-07-30 DIAGNOSIS — F3181 Bipolar II disorder: Secondary | ICD-10-CM | POA: Diagnosis not present

## 2022-07-31 ENCOUNTER — Other Ambulatory Visit: Payer: Self-pay | Admitting: Otolaryngology

## 2022-07-31 DIAGNOSIS — F410 Panic disorder [episodic paroxysmal anxiety] without agoraphobia: Secondary | ICD-10-CM | POA: Diagnosis not present

## 2022-07-31 DIAGNOSIS — F3181 Bipolar II disorder: Secondary | ICD-10-CM | POA: Diagnosis not present

## 2022-08-01 DIAGNOSIS — F3132 Bipolar disorder, current episode depressed, moderate: Secondary | ICD-10-CM | POA: Diagnosis not present

## 2022-08-02 DIAGNOSIS — F3132 Bipolar disorder, current episode depressed, moderate: Secondary | ICD-10-CM | POA: Diagnosis not present

## 2022-08-03 DIAGNOSIS — F3132 Bipolar disorder, current episode depressed, moderate: Secondary | ICD-10-CM | POA: Diagnosis not present

## 2022-08-06 DIAGNOSIS — F3181 Bipolar II disorder: Secondary | ICD-10-CM | POA: Diagnosis not present

## 2022-08-06 DIAGNOSIS — F3132 Bipolar disorder, current episode depressed, moderate: Secondary | ICD-10-CM | POA: Diagnosis not present

## 2022-08-07 DIAGNOSIS — F3132 Bipolar disorder, current episode depressed, moderate: Secondary | ICD-10-CM | POA: Diagnosis not present

## 2022-08-07 DIAGNOSIS — F3181 Bipolar II disorder: Secondary | ICD-10-CM | POA: Diagnosis not present

## 2022-08-08 DIAGNOSIS — F3132 Bipolar disorder, current episode depressed, moderate: Secondary | ICD-10-CM | POA: Diagnosis not present

## 2022-08-09 DIAGNOSIS — F3132 Bipolar disorder, current episode depressed, moderate: Secondary | ICD-10-CM | POA: Diagnosis not present

## 2022-08-10 DIAGNOSIS — F3132 Bipolar disorder, current episode depressed, moderate: Secondary | ICD-10-CM | POA: Diagnosis not present

## 2022-08-13 DIAGNOSIS — F3132 Bipolar disorder, current episode depressed, moderate: Secondary | ICD-10-CM | POA: Diagnosis not present

## 2022-08-14 DIAGNOSIS — F3132 Bipolar disorder, current episode depressed, moderate: Secondary | ICD-10-CM | POA: Diagnosis not present

## 2022-08-14 DIAGNOSIS — F3181 Bipolar II disorder: Secondary | ICD-10-CM | POA: Diagnosis not present

## 2022-08-15 DIAGNOSIS — F3132 Bipolar disorder, current episode depressed, moderate: Secondary | ICD-10-CM | POA: Diagnosis not present

## 2022-08-17 DIAGNOSIS — F3132 Bipolar disorder, current episode depressed, moderate: Secondary | ICD-10-CM | POA: Diagnosis not present

## 2022-08-20 DIAGNOSIS — F3132 Bipolar disorder, current episode depressed, moderate: Secondary | ICD-10-CM | POA: Diagnosis not present

## 2022-08-22 ENCOUNTER — Other Ambulatory Visit: Payer: Self-pay | Admitting: Pulmonary Disease

## 2022-08-22 DIAGNOSIS — F3132 Bipolar disorder, current episode depressed, moderate: Secondary | ICD-10-CM | POA: Diagnosis not present

## 2022-08-22 NOTE — Telephone Encounter (Signed)
Please advise on refill request

## 2022-08-23 DIAGNOSIS — F3132 Bipolar disorder, current episode depressed, moderate: Secondary | ICD-10-CM | POA: Diagnosis not present

## 2022-08-24 DIAGNOSIS — F3132 Bipolar disorder, current episode depressed, moderate: Secondary | ICD-10-CM | POA: Diagnosis not present

## 2022-08-27 ENCOUNTER — Other Ambulatory Visit: Payer: Self-pay

## 2022-08-27 ENCOUNTER — Encounter (HOSPITAL_BASED_OUTPATIENT_CLINIC_OR_DEPARTMENT_OTHER): Payer: Self-pay | Admitting: Otolaryngology

## 2022-08-27 DIAGNOSIS — F3132 Bipolar disorder, current episode depressed, moderate: Secondary | ICD-10-CM | POA: Diagnosis not present

## 2022-08-27 DIAGNOSIS — F3181 Bipolar II disorder: Secondary | ICD-10-CM | POA: Diagnosis not present

## 2022-08-28 ENCOUNTER — Encounter (HOSPITAL_BASED_OUTPATIENT_CLINIC_OR_DEPARTMENT_OTHER)
Admission: RE | Disposition: A | Discharge status: Home / Self Care | Payer: Self-pay | Source: Home / Self Care | Attending: Otolaryngology

## 2022-08-28 ENCOUNTER — Ambulatory Visit (HOSPITAL_BASED_OUTPATIENT_CLINIC_OR_DEPARTMENT_OTHER): Payer: BC Managed Care – PPO | Admitting: Anesthesiology

## 2022-08-28 ENCOUNTER — Ambulatory Visit (HOSPITAL_BASED_OUTPATIENT_CLINIC_OR_DEPARTMENT_OTHER)
Admission: RE | Admit: 2022-08-28 | Discharge: 2022-08-28 | Disposition: A | Discharge status: Home / Self Care | Payer: BC Managed Care – PPO | Attending: Otolaryngology | Admitting: Otolaryngology

## 2022-08-28 ENCOUNTER — Encounter (HOSPITAL_BASED_OUTPATIENT_CLINIC_OR_DEPARTMENT_OTHER): Payer: Self-pay | Admitting: Otolaryngology

## 2022-08-28 ENCOUNTER — Other Ambulatory Visit: Payer: Self-pay | Admitting: Family Medicine

## 2022-08-28 DIAGNOSIS — F1290 Cannabis use, unspecified, uncomplicated: Secondary | ICD-10-CM | POA: Insufficient documentation

## 2022-08-28 DIAGNOSIS — G4733 Obstructive sleep apnea (adult) (pediatric): Secondary | ICD-10-CM | POA: Diagnosis not present

## 2022-08-28 DIAGNOSIS — E039 Hypothyroidism, unspecified: Secondary | ICD-10-CM | POA: Diagnosis not present

## 2022-08-28 DIAGNOSIS — E559 Vitamin D deficiency, unspecified: Secondary | ICD-10-CM

## 2022-08-28 DIAGNOSIS — F319 Bipolar disorder, unspecified: Secondary | ICD-10-CM | POA: Diagnosis not present

## 2022-08-28 DIAGNOSIS — F3132 Bipolar disorder, current episode depressed, moderate: Secondary | ICD-10-CM | POA: Diagnosis not present

## 2022-08-28 DIAGNOSIS — F418 Other specified anxiety disorders: Secondary | ICD-10-CM | POA: Diagnosis not present

## 2022-08-28 HISTORY — PX: DRUG INDUCED ENDOSCOPY: SHX6808

## 2022-08-28 SURGERY — DRUG INDUCED SLEEP ENDOSCOPY
Anesthesia: Monitor Anesthesia Care | Site: Nose

## 2022-08-28 MED ORDER — ACETAMINOPHEN 10 MG/ML IV SOLN: 1000.0000 mg | Freq: Once | INTRAVENOUS | Status: DC | PRN

## 2022-08-28 MED ORDER — PROPOFOL 500 MG/50ML IV EMUL
INTRAVENOUS | Status: DC | PRN
  Administered 2022-08-28: 25 ug/kg/min via INTRAVENOUS

## 2022-08-28 MED ORDER — ONDANSETRON HCL 4 MG/2ML IJ SOLN
INTRAMUSCULAR | Status: DC | PRN
  Administered 2022-08-28: 4 mg via INTRAVENOUS

## 2022-08-28 MED ORDER — OXYCODONE HCL 5 MG PO TABS: 5.0000 mg | ORAL_TABLET | Freq: Once | ORAL | Status: DC | PRN

## 2022-08-28 MED ORDER — LACTATED RINGERS IV SOLN: INTRAVENOUS | Status: DC

## 2022-08-28 MED ORDER — PROPOFOL 10 MG/ML IV BOLUS
INTRAVENOUS | Status: DC | PRN
  Administered 2022-08-28 (×3): 20 mg via INTRAVENOUS

## 2022-08-28 MED ORDER — FENTANYL CITRATE (PF) 100 MCG/2ML IJ SOLN: 25.0000 ug | INTRAMUSCULAR | Status: DC | PRN

## 2022-08-28 MED ORDER — OXYMETAZOLINE HCL 0.05 % NA SOLN
NASAL | Status: DC | PRN
  Administered 2022-08-28: 1 via TOPICAL

## 2022-08-28 MED ORDER — AMISULPRIDE (ANTIEMETIC) 5 MG/2ML IV SOLN: 10.0000 mg | Freq: Once | INTRAVENOUS | Status: DC | PRN

## 2022-08-28 MED ORDER — ACETAMINOPHEN 160 MG/5ML PO SOLN: 325.0000 mg | ORAL | Status: DC | PRN

## 2022-08-28 MED ORDER — PROMETHAZINE HCL 25 MG/ML IJ SOLN: 6.2500 mg | INTRAMUSCULAR | Status: DC | PRN

## 2022-08-28 MED ORDER — ACETAMINOPHEN 325 MG PO TABS: 325.0000 mg | ORAL_TABLET | ORAL | Status: DC | PRN

## 2022-08-28 MED ORDER — OXYMETAZOLINE HCL 0.05 % NA SOLN
NASAL | Status: AC
  Filled 2022-08-28: qty 30

## 2022-08-28 MED ORDER — OXYCODONE HCL 5 MG/5ML PO SOLN: 5.0000 mg | Freq: Once | ORAL | Status: DC | PRN

## 2022-08-28 MED ORDER — LIDOCAINE HCL (CARDIAC) PF 100 MG/5ML IV SOSY
PREFILLED_SYRINGE | INTRAVENOUS | Status: DC | PRN
  Administered 2022-08-28: 60 mg via INTRAVENOUS

## 2022-08-28 MED ORDER — LIDOCAINE-EPINEPHRINE 1 %-1:100000 IJ SOLN
INTRAMUSCULAR | Status: AC
  Filled 2022-08-28: qty 1

## 2022-08-28 SURGICAL SUPPLY — 11 items
CANISTER SUCT 1200ML W/VALVE (MISCELLANEOUS) ×1 IMPLANT
GLOVE BIO SURGEON STRL SZ7.5 (GLOVE) ×1 IMPLANT
KIT CLEAN ENDO (MISCELLANEOUS) ×1 IMPLANT
NDL HYPO 27GX1-1/4 (NEEDLE) IMPLANT
NEEDLE HYPO 27GX1-1/4 (NEEDLE) IMPLANT
PATTIES SURGICAL .5 X3 (DISPOSABLE) ×1 IMPLANT
SHEET MEDIUM DRAPE 40X70 STRL (DRAPES) ×1 IMPLANT
SOL ANTI FOG 6CC (MISCELLANEOUS) ×1 IMPLANT
SYR CONTROL 10ML LL (SYRINGE) IMPLANT
TOWEL GREEN STERILE FF (TOWEL DISPOSABLE) ×1 IMPLANT
TUBE CONNECTING 20X1/4 (TUBING) ×1 IMPLANT

## 2022-08-28 NOTE — Anesthesia Procedure Notes (Signed)
Procedure Name: MAC Date/Time: 08/28/2022 12:24 PM  Performed by: Signe Colt, CRNAPre-anesthesia Checklist: Patient identified, Emergency Drugs available, Suction available, Patient being monitored and Timeout performed Patient Re-evaluated:Patient Re-evaluated prior to induction

## 2022-08-28 NOTE — Transfer of Care (Signed)
Immediate Anesthesia Transfer of Care Note  Patient: Alexander Harrington  Procedure(s) Performed: DRUG INDUCED ENDOSCOPY (Nose)  Patient Location: PACU  Anesthesia Type:MAC  Level of Consciousness: awake, alert , oriented, and patient cooperative  Airway & Oxygen Therapy: Patient Spontanous Breathing  Post-op Assessment: Report given to RN and Post -op Vital signs reviewed and stable  Post vital signs: Reviewed and stable  Last Vitals:  Vitals Value Taken Time  BP    Temp    Pulse 70 08/28/22 1223  Resp    SpO2 100 % 08/28/22 1223  Vitals shown include unvalidated device data.  Last Pain:  Vitals:   08/28/22 1119  TempSrc: Oral  PainSc: 0-No pain         Complications: No notable events documented.

## 2022-08-28 NOTE — Progress Notes (Addendum)
[place] [facility] [address] [place], [place] [address] [phone]  Date:  08/30/2022   Name:  Alexander Harrington   DOB:  11-29-1990   MRN:  [id number]  PCP:  Darreld Mclean, MD    Chief Complaint: Fatigue (Pt states having fatigue and muscle aches and pain. )   History of Present Illness:  Alexander Harrington is a 32 y.o. very pleasant male patient who presents with the following:  Patient seen today with concern of illness-  history of mood disorder, hypothyroidism and hyperlipidemia, sleep apnea    Most recent visit with myself was in November.  Very fatigued.  He had done some ketamine infusions for depression, was not fully compliant with his CPAP machine  He was seen in the ER on 11/30 after a self-harm episode-he had cut his arm and chin area The ER repaired his wounds and he was sent for inpatient psychiatric treatment  Seen by pulmonology 12/6 He also saw Dr. Redmond Baseman with ENT last month to discuss possible alternative treatment such as inspire device.  Dr. Redmond Baseman plan to arrange a drug-induced sleep endoscopy which was just completed Pt reports he was told taking his tonsils out may help with his sleep apnea and fatigue   He notes that his muscles feel sore and stiff- just for the last couple of months No recent medication change or other change in his routine He did not notice this change right when he started on crestor- just more recently   He also notes he tends to feel very weak and lightheaded when he is doing activities such as grocery shopping Patient Active Problem List   Diagnosis Date Noted   Hyperlipidemia 06/03/2020   Bipolar I disorder, most recent episode depressed ([place]) 08/07/2017    Class: Chronic   Hoarseness of voice 10/04/2015   Tachycardia 08/30/2015   Leukocytosis 02/08/2015   Other specified hypothyroidism 02/08/2015   MDD (major depressive disorder), recurrent episode, severe ([place]) 12/01/2013    Substance induced mood disorder ([place]) 04/11/2012    Class: Acute   Severe episode of recurrent major depressive disorder ([place]) 04/09/2012    Class: Acute   Keratosis pilaris 02/21/2011    Past Medical History:  Diagnosis Date   ADHD    Anxiety    Dr. Toy Care   Bipolar 2 disorder [facility])    Depression     Past Surgical History:  Procedure Laterality Date   DRUG INDUCED ENDOSCOPY N/A 08/28/2022   Procedure: DRUG INDUCED ENDOSCOPY;  Surgeon: Melida Quitter, MD;  Location: [facility];  Service: ENT;  Laterality: N/A;   OTHER SURGICAL HISTORY     sedated ect treatment    Social History   Tobacco Use   Smoking status: Never   Smokeless tobacco: Never   Tobacco comments:    Patient does vape occasionally.  No nicotine  Vaping Use   Vaping Use: Never used  Substance Use Topics   Alcohol use: Yes    Alcohol/week: 5.0 - 6.0 standard drinks of alcohol    Types: 5 - 6 Cans of beer per week    Comment: once a week or every other week   Drug use: Yes    Frequency: 1.0 times per week    Types: Marijuana    Comment: Occasional use    Family History  Problem Relation Age of Onset   Arthritis Mother    Mental illness Mother  Depression Mother    Anxiety disorder Mother    Drug abuse Maternal Grandfather    Alcohol abuse Maternal Grandfather    Drug abuse Maternal Grandmother    Alcohol abuse Maternal Grandmother     Allergies  Allergen Reactions   Amphetamines Other (See Comments)    Caused depression that went away after stopping the amphetamine   Benzodiazepines Other (See Comments)    Got cloudy thinking, loss of memory, suicidal thinking on Klonopin.   Codeine Rash   Crestor [Rosuvastatin] Other (See Comments)    Myalgia    Medication list has been reviewed and updated.  Current Outpatient Medications on File Prior to Visit  Medication Sig Dispense Refill   clonazePAM (KLONOPIN) 1 MG tablet Take 1 mg by mouth 3 (three) times daily as needed.      hydrOXYzine (ATARAX) 10 MG tablet Take 10 mg by mouth 3 (three) times daily as needed.     lithium carbonate (LITHOBID) 300 MG CR tablet Take 300 mg by mouth 2 (two) times daily.     propranolol ER (INDERAL LA) 60 MG 24 hr capsule Take 60 mg by mouth daily.     rosuvastatin (CRESTOR) 5 MG tablet TAKE 1 TABLET(5 MG) BY MOUTH DAILY 90 tablet 3   tiZANidine (ZANAFLEX) 4 MG tablet Take 4 mg by mouth 2 (two) times daily.     vortioxetine HBr (TRINTELLIX) 5 MG TABS tablet Take 15 mg by mouth daily.     zolpidem (AMBIEN) 10 MG tablet TAKE 1 TABLET(10 MG) BY MOUTH AT BEDTIME AS NEEDED FOR SLEEP 30 tablet 3   No current facility-administered medications on file prior to visit.    Review of Systems:  As per HPI- otherwise negative.   Physical Examination: Vitals:   08/30/22 1526  BP: 120/86  Pulse: 86  Resp: 18  Temp: (!) 97.5 F (36.4 C)  SpO2: 99%   Vitals:   08/30/22 1526  Weight: 211 lb 12.8 oz (96.1 kg)  Height: 5' 10"$  (1.778 m)   Body mass index is 30.39 kg/m. Ideal Body Weight: Weight in (lb) to have BMI = 25: 173.9  GEN: no acute distress.  Obese, looks well, somewhat flat affect per his normal  HEENT: Atraumatic, Normocephalic.  Ears and Nose: No external deformity. CV: RRR, No M/G/R. No JVD. No thrill. No extra heart sounds. PULM: CTA B, no wheezes, crackles, rhonchi. No retractions. No resp. distress. No accessory muscle use. ABD: S, NT, ND. No rebound. No HSM. EXTR: No c/c/e PSYCH: Normally interactive. Conversant.   EKG: NSR rate 74  Wt Readings from Last 3 Encounters:  08/30/22 211 lb 12.8 oz (96.1 kg)  08/28/22 211 lb 3.2 oz (95.8 kg)  06/27/22 216 lb 6.4 oz (98.2 kg)   Orthostatic VS for the past 72 hrs (Last 3 readings):  Orthostatic BP Patient Position BP Location Cuff Size Orthostatic Pulse  08/30/22 1557 100/90 Standing Left Arm -- 90  08/30/22 1556 110/90 Sitting Right Arm -- 86  08/30/22 1555 110/82 Supine Left Arm -- 73  08/30/22 1526 -- Sitting  Left Arm Normal --    Assessment and Plan: Myalgia - Plan: Basic metabolic panel, CBC, CK (Creatine Kinase)  Vitamin D deficiency - Plan: VITAMIN D 25 Hydroxy (Vit-D Deficiency, Fractures)  Medication monitoring encounter - Plan: Lithium level  Weakness - Plan: Ambulatory referral to Cardiology, EKG 12-Lead  Hyperlipidemia, unspecified hyperlipidemia type - Plan: Lipid panel  Orthostatic hypotension  Patient seen today with a few concerns.  He notes he has been very tired and achy.  He does have sleep apnea, ENT is talking about a tonsillectomy to help treat his apnea symptoms We will also check a creatinine kinase due to muscle aches  Patient wonders if he has POTS syndrome, he was read about this and thinks it might apply to him.  He does have some orthostatic changes on testing as above. He has come to me now several times complaining of chronic fatigue and request to see cardiology.  I will make a referral for him Will plan further follow- up pending labs.  Signed Lamar Blinks, MD Addendum 2/9.  Received labs as below, message to patient  Results for orders placed or performed in visit on [id number]  Basic metabolic panel  Result Value Ref Range   Sodium 138 135 - 145 mEq/L   Potassium 4.5 3.5 - 5.1 mEq/L   Chloride 101 96 - 112 mEq/L   CO2 27 19 - 32 mEq/L   Glucose, Bld 70 70 - 99 mg/dL   BUN 12 6 - 23 mg/dL   Creatinine, Ser 1.04 0.40 - 1.50 mg/dL   GFR 95.54 >60.00 mL/min   Calcium 9.7 8.4 - 10.5 mg/dL  CBC  Result Value Ref Range   WBC 15.6 (H) 4.0 - 10.5 K/uL   RBC 5.40 4.22 - 5.81 Mil/uL   Platelets 467.0 (H) 150.0 - 400.0 K/uL   Hemoglobin 15.1 13.0 - 17.0 g/dL   HCT 45.3 39.0 - 52.0 %   MCV 83.9 78.0 - 100.0 fl   MCHC 33.3 30.0 - 36.0 g/dL   RDW 13.5 11.5 - 15.5 %  Lipid panel  Result Value Ref Range   Cholesterol 198 0 - 200 mg/dL   Triglycerides 396.0 (H) 0.0 - 149.0 mg/dL   HDL 41.30 >39.00 mg/dL   VLDL 79.2 (H) 0.0 - 40.0 mg/dL   Total CHOL/HDL  Ratio 5    NonHDL 156.97   Lithium level  Result Value Ref Range   Lithium Lvl 0.4 (L) 0.6 - 1.2 mmol/L  VITAMIN D 25 Hydroxy (Vit-D Deficiency, Fractures)  Result Value Ref Range   VITD 31.41 30.00 - 100.00 ng/mL  CK (Creatine Kinase)  Result Value Ref Range   Total CK 97 7 - 232 U/L  LDL cholesterol, direct  Result Value Ref Range   Direct LDL 112.0 mg/dL

## 2022-08-28 NOTE — H&P (Signed)
Alexander Harrington is an 32 y.o. male.   Chief Complaint: Sleep apnea HPI: 32 year old male with obstructive sleep apnea who has been unable to tolerate CPAP.  Past Medical History:  Diagnosis Date   ADHD    Anxiety    Dr. Toy Care   Bipolar 2 disorder [facility])    Depression     Past Surgical History:  Procedure Laterality Date   OTHER SURGICAL HISTORY     sedated ect treatment    Family History  Problem Relation Age of Onset   Arthritis Mother    Mental illness Mother    Depression Mother    Anxiety disorder Mother    Drug abuse Maternal Grandfather    Alcohol abuse Maternal Grandfather    Drug abuse Maternal Grandmother    Alcohol abuse Maternal Grandmother    Social History:  reports that he has never smoked. He has never used smokeless tobacco. He reports current alcohol use of about 5.0 - 6.0 standard drinks of alcohol per week. He reports current drug use. Frequency: 1.00 time per week. Drug: Marijuana.  Allergies:  Allergies  Allergen Reactions   Amphetamines Other (See Comments)    Caused depression that went away after stopping the amphetamine   Benzodiazepines Other (See Comments)    Got cloudy thinking, loss of memory, suicidal thinking on Klonopin.   Codeine Rash   Crestor [Rosuvastatin] Other (See Comments)    Myalgia    Medications Prior to Admission  Medication Sig Dispense Refill   clonazePAM (KLONOPIN) 1 MG tablet Take 1 mg by mouth 3 (three) times daily as needed.     hydrOXYzine (ATARAX) 10 MG tablet Take 10 mg by mouth 3 (three) times daily as needed.     lithium carbonate (LITHOBID) 300 MG CR tablet Take 300 mg by mouth 2 (two) times daily.     propranolol ER (INDERAL LA) 60 MG 24 hr capsule Take 60 mg by mouth daily.     rosuvastatin (CRESTOR) 5 MG tablet TAKE 1 TABLET(5 MG) BY MOUTH DAILY 90 tablet 3   tiZANidine (ZANAFLEX) 4 MG tablet Take 4 mg by mouth 2 (two) times daily.     vortioxetine HBr (TRINTELLIX) 5 MG TABS tablet Take 15 mg by mouth daily.      zolpidem (AMBIEN) 10 MG tablet TAKE 1 TABLET(10 MG) BY MOUTH AT BEDTIME AS NEEDED FOR SLEEP 30 tablet 3    No results found for this or any previous visit (from the past 48 hour(s)). No results found.  Review of Systems  All other systems reviewed and are negative.   Height '5\' 10"'$  (1.778 m), weight 95.8 kg. Physical Exam Constitutional:      Appearance: Normal appearance. He is normal weight.  HENT:     Head: Normocephalic and atraumatic.     Right Ear: External ear normal.     Left Ear: External ear normal.     Nose: Nose normal.     Mouth/Throat:     Mouth: Mucous membranes are moist.     Pharynx: Oropharynx is clear.  Eyes:     Extraocular Movements: Extraocular movements intact.     Conjunctiva/sclera: Conjunctivae normal.     Pupils: Pupils are equal, round, and reactive to light.  Cardiovascular:     Rate and Rhythm: Normal rate.  Pulmonary:     Effort: Pulmonary effort is normal.  Musculoskeletal:     Cervical back: Normal range of motion.  Skin:    General: Skin is warm  and dry.  Neurological:     General: No focal deficit present.     Mental Status: He is alert and oriented to person, place, and time.  Psychiatric:        Mood and Affect: Mood normal.        Behavior: Behavior normal.        Thought Content: Thought content normal.        Judgment: Judgment normal.      Assessment/Plan Obstructive sleep apnea and BMI 30.30  To OR for sleep endoscopy.  Melida Quitter, MD 08/28/2022, 11:58 AM

## 2022-08-28 NOTE — Anesthesia Postprocedure Evaluation (Signed)
Anesthesia Post Note  Patient: Alexander Harrington  Procedure(s) Performed: DRUG INDUCED ENDOSCOPY (Nose)     Patient location during evaluation: PACU Anesthesia Type: MAC Level of consciousness: awake and alert Pain management: pain level controlled Vital Signs Assessment: post-procedure vital signs reviewed and stable Respiratory status: spontaneous breathing, nonlabored ventilation, respiratory function stable and patient connected to nasal cannula oxygen Cardiovascular status: stable and blood pressure returned to baseline Postop Assessment: no apparent nausea or vomiting Anesthetic complications: no  No notable events documented.  Last Vitals:  Vitals:   08/28/22 1245 08/28/22 1300  BP: 128/83 (!) 127/93  Pulse: 84 77  Resp: 16   Temp:  37.4 C  SpO2: 96% 98%    Last Pain:  Vitals:   08/28/22 1245  TempSrc:   PainSc: 0-No pain                 Effie Berkshire

## 2022-08-28 NOTE — Op Note (Signed)
Preop diagnosis: Obstructive sleep apnea Postop diagnosis: same Procedure: Drug-induced sleep endoscopy Surgeon: Redmond Baseman Anesth: IV sedation Compl: None Findings: There is majority anterior-posterior collapse at the velum making him a candidate for hypoglossal nerve stimulator placement.  There was marked sidewall collapse at the tonsils and anterior-posterior tongue base collapse. Description:  After discussing risks, benefits, and alternatives, the patient was brought to the operative suite and placed on the operative table in the supine position.  Anesthesia was induced and the patient was given light sedation to simulate natural sleep. When the proper level was reached, an Afrin-soaked pledget was placed in the right nasal passage for a couple of minutes and then removed.  The fiberoptic laryngoscope was then passed to view the pharynx and larynx.  Findings are noted above and the exam was recorded.  After completion, the scope was removed and the patient was returned to anesthesia for wakeup and was moved to the recovery room in stable condition.

## 2022-08-28 NOTE — Discharge Instructions (Signed)

## 2022-08-28 NOTE — Anesthesia Preprocedure Evaluation (Addendum)
Anesthesia Evaluation  Patient identified by MRN, date of birth, ID band Patient awake    Reviewed: Allergy & Precautions, NPO status , Patient's Chart, lab work & pertinent test results  Airway Mallampati: IV  TM Distance: >3 FB Neck ROM: Full    Dental  (+) Teeth Intact   Pulmonary neg pulmonary ROS   breath sounds clear to auscultation       Cardiovascular negative cardio ROS  Rhythm:Regular Rate:Normal     Neuro/Psych  PSYCHIATRIC DISORDERS Anxiety Depression Bipolar Disorder   negative neurological ROS     GI/Hepatic negative GI ROS, Neg liver ROS,,,  Endo/Other  Hypothyroidism    Renal/GU negative Renal ROS     Musculoskeletal negative musculoskeletal ROS (+)    Abdominal Normal abdominal exam  (+)   Peds  Hematology negative hematology ROS (+)   Anesthesia Other Findings   Reproductive/Obstetrics                             Anesthesia Physical Anesthesia Plan  ASA: 2  Anesthesia Plan: MAC   Post-op Pain Management: Minimal or no pain anticipated   Induction: Intravenous  PONV Risk Score and Plan: 0 and Propofol infusion and Ondansetron  Airway Management Planned: Natural Airway  Additional Equipment: None  Intra-op Plan:   Post-operative Plan:   Informed Consent: I have reviewed the patients History and Physical, chart, labs and discussed the procedure including the risks, benefits and alternatives for the proposed anesthesia with the patient or authorized representative who has indicated his/her understanding and acceptance.       Plan Discussed with: CRNA  Anesthesia Plan Comments:        Anesthesia Quick Evaluation

## 2022-08-28 NOTE — Brief Op Note (Signed)
08/28/2022  12:20 PM  PATIENT:  Alexander Harrington  32 y.o. male  PRE-OPERATIVE DIAGNOSIS:  Obstructive Sleep Apnea BMI 30.0-30.9,adult  POST-OPERATIVE DIAGNOSIS:  Obstructive Sleep ApneaBMI 30.0-30.9,adult  PROCEDURE:  Procedure(s): DRUG INDUCED ENDOSCOPY (Bilateral)  SURGEON:  Surgeon(s) and Role:    Melida Quitter, MD - Primary  PHYSICIAN ASSISTANT:   ASSISTANTS: none   ANESTHESIA:   IV sedation  EBL:  None   BLOOD ADMINISTERED:none  DRAINS: none   LOCAL MEDICATIONS USED:  NONE  SPECIMEN:  No Specimen  DISPOSITION OF SPECIMEN:  N/A  COUNTS:  YES  TOURNIQUET:  * No tourniquets in log *  DICTATION: .Note written in EPIC  PLAN OF CARE: Discharge to home after PACU  PATIENT DISPOSITION:  PACU - hemodynamically stable.   Delay start of Pharmacological VTE agent (>24hrs) due to surgical blood loss or risk of bleeding: no

## 2022-08-29 ENCOUNTER — Encounter (HOSPITAL_BASED_OUTPATIENT_CLINIC_OR_DEPARTMENT_OTHER): Payer: Self-pay | Admitting: Otolaryngology

## 2022-08-29 DIAGNOSIS — F3132 Bipolar disorder, current episode depressed, moderate: Secondary | ICD-10-CM | POA: Diagnosis not present

## 2022-08-29 NOTE — Progress Notes (Signed)
Mailbox full

## 2022-08-30 ENCOUNTER — Ambulatory Visit (INDEPENDENT_AMBULATORY_CARE_PROVIDER_SITE_OTHER): Payer: BC Managed Care – PPO | Admitting: Family Medicine

## 2022-08-30 VITALS — BP 120/86 | HR 86 | Temp 97.5°F | Resp 18 | Ht 70.0 in | Wt 211.8 lb

## 2022-08-30 DIAGNOSIS — M791 Myalgia, unspecified site: Secondary | ICD-10-CM | POA: Diagnosis not present

## 2022-08-30 DIAGNOSIS — F5101 Primary insomnia: Secondary | ICD-10-CM | POA: Diagnosis not present

## 2022-08-30 DIAGNOSIS — R531 Weakness: Secondary | ICD-10-CM | POA: Diagnosis not present

## 2022-08-30 DIAGNOSIS — F332 Major depressive disorder, recurrent severe without psychotic features: Secondary | ICD-10-CM | POA: Diagnosis not present

## 2022-08-30 DIAGNOSIS — F3132 Bipolar disorder, current episode depressed, moderate: Secondary | ICD-10-CM | POA: Diagnosis not present

## 2022-08-30 DIAGNOSIS — E785 Hyperlipidemia, unspecified: Secondary | ICD-10-CM | POA: Diagnosis not present

## 2022-08-30 DIAGNOSIS — I951 Orthostatic hypotension: Secondary | ICD-10-CM

## 2022-08-30 DIAGNOSIS — F422 Mixed obsessional thoughts and acts: Secondary | ICD-10-CM | POA: Diagnosis not present

## 2022-08-30 DIAGNOSIS — Z5181 Encounter for therapeutic drug level monitoring: Secondary | ICD-10-CM

## 2022-08-30 DIAGNOSIS — E559 Vitamin D deficiency, unspecified: Secondary | ICD-10-CM | POA: Diagnosis not present

## 2022-08-30 DIAGNOSIS — F411 Generalized anxiety disorder: Secondary | ICD-10-CM | POA: Diagnosis not present

## 2022-08-30 NOTE — Patient Instructions (Addendum)
I will be in touch with your labs For vitamin D maintenance I would recommend taking 2000 iu OTC daily   Please discuss next steps with Dr Redmond Baseman- it may be that removing your tonsils will help with your sleep apnea  Your EKG looks good today!   We will have you see cardiology to discuss if POTS is a possible issue for you

## 2022-08-31 ENCOUNTER — Encounter: Payer: Self-pay | Admitting: Family Medicine

## 2022-08-31 DIAGNOSIS — F3132 Bipolar disorder, current episode depressed, moderate: Secondary | ICD-10-CM | POA: Diagnosis not present

## 2022-08-31 DIAGNOSIS — D72829 Elevated white blood cell count, unspecified: Secondary | ICD-10-CM

## 2022-08-31 LAB — LIPID PANEL
Cholesterol: 198 mg/dL (ref 0–200)
HDL: 41.3 mg/dL (ref >39.00)
NonHDL: 156.97
Total CHOL/HDL Ratio: 5
Triglycerides: 396 mg/dL — ABNORMAL HIGH (ref 0.0–149.0)
VLDL: 79.2 mg/dL — ABNORMAL HIGH (ref 0.0–40.0)

## 2022-08-31 LAB — CBC
HCT: 45.3 % (ref 39.0–52.0)
Hemoglobin: 15.1 g/dL (ref 13.0–17.0)
MCHC: 33.3 g/dL (ref 30.0–36.0)
MCV: 83.9 fl (ref 78.0–100.0)
Platelets: 467 10*3/uL — ABNORMAL HIGH (ref 150.0–400.0)
RBC: 5.4 Mil/uL (ref 4.22–5.81)
RDW: 13.5 % (ref 11.5–15.5)
WBC: 15.6 10*3/uL — ABNORMAL HIGH (ref 4.0–10.5)

## 2022-08-31 LAB — BASIC METABOLIC PANEL
BUN: 12 mg/dL (ref 6–23)
CO2: 27 mEq/L (ref 19–32)
Calcium: 9.7 mg/dL (ref 8.4–10.5)
Chloride: 101 mEq/L (ref 96–112)
Creatinine, Ser: 1.04 mg/dL (ref 0.40–1.50)
GFR: 95.54 mL/min (ref >60.00)
Glucose, Bld: 70 mg/dL (ref 70–99)
Potassium: 4.5 mEq/L (ref 3.5–5.1)
Sodium: 138 mEq/L (ref 135–145)

## 2022-08-31 LAB — LITHIUM LEVEL: Lithium Lvl: 0.4 mmol/L — ABNORMAL LOW (ref 0.6–1.2)

## 2022-08-31 LAB — CK: Total CK: 97 U/L (ref 7–232)

## 2022-08-31 LAB — VITAMIN D 25 HYDROXY (VIT D DEFICIENCY, FRACTURES): VITD: 31.41 ng/mL (ref 30.00–100.00)

## 2022-08-31 LAB — LDL CHOLESTEROL, DIRECT: Direct LDL: 112 mg/dL

## 2022-09-01 DIAGNOSIS — F3181 Bipolar II disorder: Secondary | ICD-10-CM | POA: Diagnosis not present

## 2022-09-01 DIAGNOSIS — F410 Panic disorder [episodic paroxysmal anxiety] without agoraphobia: Secondary | ICD-10-CM | POA: Diagnosis not present

## 2022-09-04 DIAGNOSIS — F3181 Bipolar II disorder: Secondary | ICD-10-CM | POA: Diagnosis not present

## 2022-09-04 DIAGNOSIS — F3132 Bipolar disorder, current episode depressed, moderate: Secondary | ICD-10-CM | POA: Diagnosis not present

## 2022-09-05 DIAGNOSIS — F3132 Bipolar disorder, current episode depressed, moderate: Secondary | ICD-10-CM | POA: Diagnosis not present

## 2022-09-06 DIAGNOSIS — F3132 Bipolar disorder, current episode depressed, moderate: Secondary | ICD-10-CM | POA: Diagnosis not present

## 2022-09-07 DIAGNOSIS — F3132 Bipolar disorder, current episode depressed, moderate: Secondary | ICD-10-CM | POA: Diagnosis not present

## 2022-09-10 ENCOUNTER — Other Ambulatory Visit: Payer: Self-pay | Admitting: Family

## 2022-09-10 DIAGNOSIS — D72829 Elevated white blood cell count, unspecified: Secondary | ICD-10-CM

## 2022-09-10 DIAGNOSIS — F3181 Bipolar II disorder: Secondary | ICD-10-CM | POA: Diagnosis not present

## 2022-09-10 DIAGNOSIS — D75839 Thrombocytosis, unspecified: Secondary | ICD-10-CM

## 2022-09-10 DIAGNOSIS — D509 Iron deficiency anemia, unspecified: Secondary | ICD-10-CM

## 2022-09-11 ENCOUNTER — Encounter: Payer: Self-pay | Admitting: *Deleted

## 2022-09-11 ENCOUNTER — Inpatient Hospital Stay: Payer: BC Managed Care – PPO | Admitting: Family

## 2022-09-11 ENCOUNTER — Inpatient Hospital Stay: Payer: BC Managed Care – PPO

## 2022-09-11 DIAGNOSIS — F3132 Bipolar disorder, current episode depressed, moderate: Secondary | ICD-10-CM | POA: Diagnosis not present

## 2022-09-12 DIAGNOSIS — F3132 Bipolar disorder, current episode depressed, moderate: Secondary | ICD-10-CM | POA: Diagnosis not present

## 2022-09-13 DIAGNOSIS — G4733 Obstructive sleep apnea (adult) (pediatric): Secondary | ICD-10-CM | POA: Diagnosis not present

## 2022-09-13 DIAGNOSIS — F3132 Bipolar disorder, current episode depressed, moderate: Secondary | ICD-10-CM | POA: Diagnosis not present

## 2022-09-14 DIAGNOSIS — F3132 Bipolar disorder, current episode depressed, moderate: Secondary | ICD-10-CM | POA: Diagnosis not present

## 2022-09-17 ENCOUNTER — Inpatient Hospital Stay: Payer: BC Managed Care – PPO | Attending: Hematology & Oncology

## 2022-09-17 ENCOUNTER — Inpatient Hospital Stay (HOSPITAL_BASED_OUTPATIENT_CLINIC_OR_DEPARTMENT_OTHER): Payer: BC Managed Care – PPO | Admitting: Family

## 2022-09-17 ENCOUNTER — Encounter: Payer: Self-pay | Admitting: Family

## 2022-09-17 VITALS — BP 122/96 | HR 68 | Temp 97.6°F | Resp 18 | Wt 208.0 lb

## 2022-09-17 DIAGNOSIS — D509 Iron deficiency anemia, unspecified: Secondary | ICD-10-CM

## 2022-09-17 DIAGNOSIS — D75839 Thrombocytosis, unspecified: Secondary | ICD-10-CM | POA: Diagnosis not present

## 2022-09-17 DIAGNOSIS — D696 Thrombocytopenia, unspecified: Secondary | ICD-10-CM | POA: Insufficient documentation

## 2022-09-17 DIAGNOSIS — G4733 Obstructive sleep apnea (adult) (pediatric): Secondary | ICD-10-CM | POA: Insufficient documentation

## 2022-09-17 DIAGNOSIS — D72829 Elevated white blood cell count, unspecified: Secondary | ICD-10-CM | POA: Insufficient documentation

## 2022-09-17 DIAGNOSIS — Z79899 Other long term (current) drug therapy: Secondary | ICD-10-CM | POA: Diagnosis not present

## 2022-09-17 LAB — LACTATE DEHYDROGENASE: LDH: 159 U/L (ref 98–192)

## 2022-09-17 LAB — CMP (CANCER CENTER ONLY)
ALT: 18 U/L (ref 0–44)
AST: 16 U/L (ref 15–41)
Albumin: 4.7 g/dL (ref 3.5–5.0)
Alkaline Phosphatase: 41 U/L (ref 38–126)
Anion gap: 9 (ref 5–15)
BUN: 8 mg/dL (ref 6–20)
CO2: 25 mmol/L (ref 22–32)
Calcium: 9.7 mg/dL (ref 8.9–10.3)
Chloride: 104 mmol/L (ref 98–111)
Creatinine: 0.86 mg/dL (ref 0.61–1.24)
GFR, Estimated: >60 mL/min (ref >60)
Glucose, Bld: 91 mg/dL (ref 70–99)
Potassium: 4 mmol/L (ref 3.5–5.1)
Sodium: 138 mmol/L (ref 135–145)
Total Bilirubin: 0.8 mg/dL (ref 0.3–1.2)
Total Protein: 7.7 g/dL (ref 6.5–8.1)

## 2022-09-17 LAB — CBC WITH DIFFERENTIAL (CANCER CENTER ONLY)
Abs Immature Granulocytes: 0.03 10*3/uL (ref 0.00–0.07)
Basophils Absolute: 0.1 10*3/uL (ref 0.0–0.1)
Basophils Relative: 1 %
Eosinophils Absolute: 0.3 10*3/uL (ref 0.0–0.5)
Eosinophils Relative: 3 %
HCT: 46.3 % (ref 39.0–52.0)
Hemoglobin: 15.5 g/dL (ref 13.0–17.0)
Immature Granulocytes: 0 %
Lymphocytes Relative: 46 %
Lymphs Abs: 4.6 10*3/uL — ABNORMAL HIGH (ref 0.7–4.0)
MCH: 27.5 pg (ref 26.0–34.0)
MCHC: 33.5 g/dL (ref 30.0–36.0)
MCV: 82.2 fL (ref 80.0–100.0)
Monocytes Absolute: 0.6 10*3/uL (ref 0.1–1.0)
Monocytes Relative: 6 %
Neutro Abs: 4.4 10*3/uL (ref 1.7–7.7)
Neutrophils Relative %: 44 %
Platelet Count: 481 10*3/uL — ABNORMAL HIGH (ref 150–400)
RBC: 5.63 MIL/uL (ref 4.22–5.81)
RDW: 12.6 % (ref 11.5–15.5)
WBC Count: 9.9 10*3/uL (ref 4.0–10.5)
nRBC: 0 % (ref 0.0–0.2)

## 2022-09-17 LAB — FERRITIN: Ferritin: 135 ng/mL (ref 24–336)

## 2022-09-17 LAB — SAVE SMEAR(SSMR), FOR PROVIDER SLIDE REVIEW

## 2022-09-17 NOTE — Progress Notes (Unsigned)
Hematology/Oncology Consultation   Name: Alexander Harrington      MRN: [id number]    Location: Room/bed info not found  Date: 09/17/2022 Time:3:16 PM   REFERRING PHYSICIAN:  Lamar Blinks, MD  REASON FOR CONSULT:  Leukocytosis, unspecified    DIAGNOSIS: Mild intermittent leukocytosis, reactive   HISTORY OF PRESENT ILLNESS: Mr. Alexander Harrington is a 32 yo gentleman with a greater than 10 year history of mild intermittent leukocytosis and thrombocytosis. Today his WBC count is 9.9, Hgb 15.5, MCV 82, platelets 481. Neutrophils 44% and lymphocytes 46%.  He notes persistent fatigue that has not improved over the last 2 years.  He has not had any issue with frequent or recurrent infections.  He is Bipolar and has been on lithium carbonate long term. He is also currently in a program for treatment of his depression.  No known familial history of elevated WBC count or thrombocytopenia.  He is not on any supplements.  He has not noted any blood loss. No abnormal bruising or petechiae.  No history of diabetes or thyroid disease.  He denies ever having had surgery in the past.  He did recently have a sleep endoscopy with Dr. Redmond Harrington for obstructive sleep apnea. He states that he was advised to have his tonsils removed as the first part of treatment for OSA.  No personal history of cancer. His mother has sarcoma and he had an aunt with colon.  No fever, chills, n/v, cough, rash, dizziness, SOB, chest pain, palpitations, abdominal pain or changes in bowel or bladder habits.  No swelling, tenderness, numbness or tingling in his extremities.  No falls or syncope.  Appetite and hydration have been good. Weight is 208 lbs.  No smoking, ETOH or recreational drug use.   ROS: All other 10 point review of systems is negative.   PAST MEDICAL HISTORY:   Past Medical History:  Diagnosis Date   ADHD    Anxiety    Dr. Toy Harrington   Bipolar 2 disorder [facility])    Depression     ALLERGIES: Allergies  Allergen Reactions    Amphetamines Other (See Comments)    Caused depression that went away after stopping the amphetamine   Benzodiazepines Other (See Comments)    Got cloudy thinking, loss of memory, suicidal thinking on Klonopin.   Codeine Rash   Crestor [Rosuvastatin] Other (See Comments)    Myalgia      MEDICATIONS:  Current Outpatient Medications on File Prior to Visit  Medication Sig Dispense Refill   clonazePAM (KLONOPIN) 1 MG tablet Take 1 mg by mouth 3 (three) times daily as needed.     hydrOXYzine (ATARAX) 10 MG tablet Take 10 mg by mouth 3 (three) times daily as needed.     lithium carbonate (LITHOBID) 300 MG CR tablet Take 300 mg by mouth 2 (two) times daily.     propranolol ER (INDERAL LA) 60 MG 24 hr capsule Take 60 mg by mouth daily.     rosuvastatin (CRESTOR) 5 MG tablet TAKE 1 TABLET(5 MG) BY MOUTH DAILY 90 tablet 3   tiZANidine (ZANAFLEX) 4 MG tablet Take 4 mg by mouth 2 (two) times daily.     vortioxetine HBr (TRINTELLIX) 5 MG TABS tablet Take 15 mg by mouth daily.     zolpidem (AMBIEN) 10 MG tablet TAKE 1 TABLET(10 MG) BY MOUTH AT BEDTIME AS NEEDED FOR SLEEP 30 tablet 3   No current facility-administered medications on file prior to visit.     PAST SURGICAL HISTORY Past Surgical  History:  Procedure Laterality Date   DRUG INDUCED ENDOSCOPY N/A 08/28/2022   Procedure: DRUG INDUCED ENDOSCOPY;  Surgeon: Alexander Quitter, MD;  Location: [facility];  Service: ENT;  Laterality: N/A;   OTHER SURGICAL HISTORY     sedated ect treatment    FAMILY HISTORY: Family History  Problem Relation Age of Onset   Arthritis Mother    Mental illness Mother    Depression Mother    Anxiety disorder Mother    Drug abuse Maternal Grandfather    Alcohol abuse Maternal Grandfather    Drug abuse Maternal Grandmother    Alcohol abuse Maternal Grandmother     SOCIAL HISTORY:  reports that he has never smoked. He has never used smokeless tobacco. He reports current alcohol use of about 5.0 -  6.0 standard drinks of alcohol per week. He reports current drug use. Frequency: 1.00 time per week. Drug: Marijuana.  PERFORMANCE STATUS: The patient's performance status is 1 - Symptomatic but completely ambulatory  PHYSICAL EXAM: Most Recent Vital Signs: There were no vitals taken for this visit. BP (!) 122/96 (BP Location: Right Arm, Patient Position: Sitting)   Pulse 68   Temp 97.6 F (36.4 C) (Oral)   Resp 18   Wt 208 lb (94.3 kg)   SpO2 99%   BMI 29.84 kg/m   General Appearance:    Alert, cooperative, no distress, appears stated age  Head:    Normocephalic, without obvious abnormality, atraumatic  Eyes:    PERRL, conjunctiva/corneas clear, EOM's intact, fundi    benign, both eyes             Throat:   Lips, mucosa, and tongue normal; teeth and gums normal  Neck:   Supple, symmetrical, trachea midline, no adenopathy;       thyroid:  No enlargement/tenderness/nodules; no carotid   bruit or JVD  Back:     Symmetric, no curvature, ROM normal, no CVA tenderness  Lungs:     Clear to auscultation bilaterally, respirations unlabored  Chest wall:    No tenderness or deformity  Heart:    Regular rate and rhythm, S1 and S2 normal, no murmur, rub   or gallop  Abdomen:     Soft, non-tender, bowel sounds active all four quadrants,    no masses, no organomegaly        Extremities:   Extremities normal, atraumatic, no cyanosis or edema  Pulses:   2+ and symmetric all extremities  Skin:   Skin color, texture, turgor normal, no rashes or lesions  Lymph nodes:   Cervical, supraclavicular, and axillary nodes normal  Neurologic:   CNII-XII intact. Normal strength, sensation and reflexes      throughout    LABORATORY DATA:  No results found for this or any previous visit (from the past 48 hour(s)).    RADIOGRAPHY: No results found.     PATHOLOGY: None  ASSESSMENT/PLAN: Mr. Alexander Harrington is a 32 yo gentleman with a greater than 10 year history of mild intermittent leukocytosis and  thrombocytosis.    All questions were answered. The patient knows to call the clinic with any problems, questions or concerns. We can certainly see the patient much sooner if necessary.  The patient was discussed with and also seen by Dr. Marin Olp and he is in agreement with the aforementioned.   Lottie Dawson, NP

## 2022-09-18 ENCOUNTER — Telehealth: Payer: Self-pay

## 2022-09-18 DIAGNOSIS — F3181 Bipolar II disorder: Secondary | ICD-10-CM | POA: Diagnosis not present

## 2022-09-18 DIAGNOSIS — F3132 Bipolar disorder, current episode depressed, moderate: Secondary | ICD-10-CM | POA: Diagnosis not present

## 2022-09-18 LAB — IRON AND IRON BINDING CAPACITY (CC-WL,HP ONLY)
Iron: 167 ug/dL (ref 45–182)
Saturation Ratios: 47 % — ABNORMAL HIGH (ref 17.9–39.5)
TIBC: 357 ug/dL (ref 250–450)
UIBC: 190 ug/dL (ref 117–376)

## 2022-09-18 NOTE — Telephone Encounter (Signed)
-----   Message from Celso Amy, NP sent at 09/18/2022 12:43 PM EST ----- Iron studies look good! We will continue to follow along and see him at his 6 month follow-up. We will re-check his counts and blood smear at that visit! Thank you!  Sarah   ----- Message ----- From: Buel Ream, [facility] [place] Sent: 09/17/2022   3:19 PM EST To: Celso Amy, NP

## 2022-09-19 DIAGNOSIS — F3132 Bipolar disorder, current episode depressed, moderate: Secondary | ICD-10-CM | POA: Diagnosis not present

## 2022-09-20 DIAGNOSIS — F3132 Bipolar disorder, current episode depressed, moderate: Secondary | ICD-10-CM | POA: Diagnosis not present

## 2022-09-21 DIAGNOSIS — F3132 Bipolar disorder, current episode depressed, moderate: Secondary | ICD-10-CM | POA: Diagnosis not present

## 2022-09-23 NOTE — Progress Notes (Unsigned)
Cardiology Office Note:   Date:  09/24/2022  NAME:  Alexander Harrington    MRN: [id number] DOB:  [birth year]   PCP:  Darreld Mclean, MD  Cardiologist:  None  Electrophysiologist:  None   Referring MD: Darreld Mclean, MD   Chief Complaint  Patient presents with   Dizziness    History of Present Illness:   Alexander Harrington is a 32 y.o. male with a hx of OSA who is being seen today for the evaluation of weakness at the request of Copland, Gay Filler, MD. he reports for the past several years she has had dizziness and fatigue.  He also describes dizziness with prolonged standing or position change.  He can also feel his heart racing and describes chest tightness.  He does suffer from bipolar disorder as well as frequent panic attacks.  He is on several psychoactive medications.  Recent lab work shows a normal thyroid.  His CBC and creatinine are within limits.  He reports he does have severe sleep apnea but is untreated.  He is going to have his tonsils removed as this could be contributing.  He reports his mother has A-fib.  No strong history of obstructive CAD.  He reports that he is obese with a BMI of 31.  He has never had a heart attack or stroke personally.  His blood pressure is within limits.  He is on several psychoactive medications including Klonopin, Neurontin, Atarax, lithium, Zanaflex, Ambien, Trintellix.  We discussed that some of his medications could be contributing.  He is drinking a lot of water.  No extra caffeine.  He denies any regular exercise.  He just walks.  He reports symptoms have been getting worse.  We discussed that that could be related to polypharmacy versus untreated sleep apnea.  He is quite concerned about POTS.  This would be a diagnosis of exclusion.  He reports his heart races mainly with anxiety.  Does not really occur with position change.  He does feel dizzy and fatigued.  We discussed the monitor to exclude arrhythmia and trying propranolol short acting to see if  this helps.  He does not take his long-acting propranolol daily.  No drug use.  No smoking.  No alcohol use reported.  Problem List OSA Bipolar disorder  HLD  Past Medical History: Past Medical History:  Diagnosis Date   ADHD    Anxiety    Dr. Toy Care   Bipolar 2 disorder [facility])    Depression     Past Surgical History: Past Surgical History:  Procedure Laterality Date   DRUG INDUCED ENDOSCOPY N/A 08/28/2022   Procedure: DRUG INDUCED ENDOSCOPY;  Surgeon: Melida Quitter, MD;  Location: [place];  Service: ENT;  Laterality: N/A;   OTHER SURGICAL HISTORY     sedated ect treatment    Current Medications: Current Meds  Medication Sig   clomiPRAMINE (ANAFRANIL) 25 MG capsule Take 25 mg by mouth at bedtime.   clonazePAM (KLONOPIN) 1 MG tablet Take 1 mg by mouth 3 (three) times daily as needed.   gabapentin (NEURONTIN) 100 MG capsule Take 100 mg by mouth 3 (three) times daily.   lithium carbonate (LITHOBID) 300 MG CR tablet Take 300 mg by mouth 2 (two) times daily.   propranolol (INDERAL) 10 MG tablet Take 1 tablet (10 mg total) by mouth 2 (two) times daily.   rosuvastatin (CRESTOR) 5 MG tablet TAKE 1 TABLET(5 MG) BY MOUTH DAILY   tiZANidine (ZANAFLEX) 4 MG tablet Take 4  mg by mouth 2 (two) times daily.   vortioxetine HBr (TRINTELLIX) 5 MG TABS tablet Take 15 mg by mouth daily.   zolpidem (AMBIEN) 10 MG tablet TAKE 1 TABLET(10 MG) BY MOUTH AT BEDTIME AS NEEDED FOR SLEEP   [DISCONTINUED] propranolol ER (INDERAL LA) 60 MG 24 hr capsule Take 60 mg by mouth daily.     Allergies:    Amphetamines, Benzodiazepines, Codeine, and Crestor [rosuvastatin]   Social History: Social History   Socioeconomic History   Marital status: Single    Spouse name: Not on file   Number of children: Not on file   Years of education: Not on file   Highest education level: Not on file  Occupational History   Occupation: student  Tobacco Use   Smoking status: Never   Smokeless tobacco:  Never   Tobacco comments:    Patient does vape occasionally.  No nicotine  Vaping Use   Vaping Use: Never used  Substance and Sexual Activity   Alcohol use: Yes    Alcohol/week: 5.0 - 6.0 standard drinks of alcohol    Types: 5 - 6 Cans of beer per week    Comment: once a week or every other week   Drug use: Yes    Frequency: 1.0 times per week    Types: Marijuana    Comment: Occasional use   Sexual activity: Not Currently    Birth control/protection: Condom  Other Topics Concern   Not on file  Social History Narrative   Caffienated drinks-no   Seat belt use often-yes   Regular Exercise-no   Smoke alarm in the home-yes   Firearms/guns in the home-no   History of physical abuse-no      04/08/2013 AHW Kasandra Knudsen was born in [place], [place], and grew up in [place], [place]. 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 He denies any legal problems. He reports that he is spiritual but acting not stick. His hobbies include watching movies, reading, and writing. His social support system consists of his father, mother, friends, and cousins. 04/08/2013 AHW            Social Determinants of Health   Financial Resource Strain: Low Risk  (07/26/2017)   Overall Financial Resource Strain (CARDIA)    Difficulty of Paying Living Expenses: Not hard at all  Food Insecurity: No Food Insecurity (09/17/2022)   Hunger Vital Sign    Worried About Running Out of Food in the Last Year: Never true    Ran Out of Food in the Last Year: Never true  Transportation Needs: No Transportation Needs (09/17/2022)   PRAPARE - Hydrologist (Medical): No    Lack of Transportation (Non-Medical): No  Physical Activity: Unknown (07/26/2017)   Exercise Vital Sign    Days of Exercise per Week: 0 days    Minutes of Exercise per  Session: Not on file  Stress: Stress Concern Present (07/26/2017)   [place]    Feeling of Stress : Very much  Social Connections: Moderately Isolated (07/26/2017)   Social Connection and Isolation Panel [NHANES]    Frequency of Communication with Friends and Family: More than three times a week    Frequency of Social Gatherings with Friends and Family: Twice a week    Attends  Religious Services: Never    Active Member of Clubs or Organizations: No    Attends Archivist Meetings: Never    Marital Status: Never married     Family History: The patient's family history includes Alcohol abuse in his maternal grandfather and maternal grandmother; Anxiety disorder in his mother; Arthritis in his mother; Depression in his mother; Drug abuse in his maternal grandfather and maternal grandmother; Mental illness in his mother.  ROS:   All other ROS reviewed and negative. Pertinent positives noted in the HPI.     EKGs/Labs/Other Studies Reviewed:   The following studies were personally reviewed by me today:  EKG:  EKG is ordered today.  The ekg ordered today demonstrates normal sinus rhythm heart rate 71, no acute ischemic changes or evidence of infarction, and was personally reviewed by me.   Recent Labs: 06/04/2022: TSH 1.39 09/17/2022: ALT 18; BUN 8; Creatinine 0.86; Hemoglobin 15.5; Platelet Count 481; Potassium 4.0; Sodium 138   Recent Lipid Panel    Component Value Date/Time   CHOL 198 08/30/2022 1605   TRIG 396.0 (H) 08/30/2022 1605   HDL 41.30 08/30/2022 1605   CHOLHDL 5 08/30/2022 1605   VLDL 79.2 (H) 08/30/2022 1605   LDLCALC 163 (H) 11/09/2014 1434   LDLDIRECT 112.0 08/30/2022 1605    Physical Exam:   VS:  BP 124/82 (BP Location: Left Arm, Patient Position: Sitting, Cuff Size: Normal)   Pulse 71   Ht '5\' 10"'$  (1.778 m)   Wt 213 lb (96.6 kg)   SpO2 99%   BMI 30.56 kg/m    Wt Readings from Last 3  Encounters:  09/24/22 213 lb (96.6 kg)  09/17/22 208 lb (94.3 kg)  08/30/22 211 lb 12.8 oz (96.1 kg)    General: Well nourished, well developed, in no acute distress Head: Atraumatic, normal size  Eyes: PEERLA, EOMI  Neck: Supple, no JVD Endocrine: No thryomegaly Cardiac: Normal S1, S2; RRR; no murmurs, rubs, or gallops Lungs: Clear to auscultation bilaterally, no wheezing, rhonchi or rales  Abd: Soft, nontender, no hepatomegaly  Ext: No edema, pulses 2+ Musculoskeletal: No deformities, BUE and BLE strength normal and equal Skin: Warm and dry, no rashes   Neuro: Alert and oriented to person, place, time, and situation, CNII-XII grossly intact, no focal deficits  Psych: Normal mood and affect   ASSESSMENT:   Alexander Harrington is a 32 y.o. male who presents for the following: 1. Weakness   2. Dizziness   3. Palpitations     PLAN:   1. Weakness 2. Dizziness 3. Palpitations -He describes several years of weakness and fatigue.  He is on several psychoactive medications including Klonopin, clomipramine, Ambien, lithium, hydroxyzine, tizanidine, Trintellix.  I do wonder if this is polypharmacy.  He also describes untreated sleep apnea which could be contributing to his symptoms.  He will see an ENT doctor to have his tonsils removed.  He may need the inspire device.  He cannot wear the sleep machine.  His recent thyroid studies are normal.  He is not anemic.  His CV examination is normal.  His EKG is normal in office today.  We discussed that symptoms could just be polypharmacy and that POTS is a diagnosis of exclusion.  It would be hard to know if he is having a specific medication given so many.  For now we will transition his propranolol to short acting.  We will proceed with a 3-day Zio patch to exclude any arrhythmia.  He will see me  back in 3 months to discuss further.  We discussed supine exercise as well as increasing his water intake.  I also would like for him to reach out to his  psychiatrist to determine if his medications need adjustment.  He will concurrently work with his ENT doctor for sleep apnea evaluation as this could be contributing to all of his symptoms.  He will see me back in 3 months to discuss further.      Disposition: Return in about 3 months (around 12/25/2022).  Medication Adjustments/Labs and Tests Ordered: Current medicines are reviewed at length with the patient today.  Concerns regarding medicines are outlined above.  Orders Placed This Encounter  Procedures   LONG TERM MONITOR (3-14 DAYS)   EKG 12-Lead   Meds ordered this encounter  Medications   propranolol (INDERAL) 10 MG tablet    Sig: Take 1 tablet (10 mg total) by mouth 2 (two) times daily.    Dispense:  180 tablet    Refill:  1    Patient Instructions  Medication Instructions:   START Propanolol 10 mg twice daily   STOP Propanolol 60 mg 24 hour tablet.   *If you need a refill on your cardiac medications before your next appointment, please call your pharmacy*  Testing/Procedures:  Bird-in[place] Monitor Instructions  Your physician has requested you wear a ZIO patch monitor for 3 days.  This is a single patch monitor. [employer] supplies one patch monitor per enrollment. Additional stickers are not available. Please do not apply patch if you will be having a Nuclear Stress Test,  Echocardiogram, Cardiac CT, MRI, or Chest Xray during the period you would be wearing the  monitor. The patch cannot be worn during these tests. You cannot remove and re-apply the  ZIO XT patch monitor.  Your ZIO patch monitor will be mailed 3 day USPS to your address on file. It may take 3-5 days  to receive your monitor after you have been enrolled.  Once you have received your monitor, please review the enclosed instructions. Your monitor  has already been registered assigning a specific monitor serial # to you.  Billing and Patient Assistance Program Information  We have supplied  [employer] with any of your insurance information on file for billing purposes. [employer] offers a sliding scale Patient Assistance Program for patients that do not have  insurance, or whose insurance does not completely cover the cost of the ZIO monitor.  You must apply for the Patient Assistance Program to qualify for this discounted rate.  To apply, please call [employer] at [phone], select option 4, select option 2, ask to apply for  Patient Assistance Program. Theodore Demark will ask your household income, and how many people  are in your household. They will quote your out-of-pocket cost based on that information.  [employer] will also be able to set up a 71-month interest-free payment plan if needed.  Applying the monitor   Shave hair from upper left chest.  Hold abrader disc by orange tab. Rub abrader in 40 strokes over the upper left chest as  indicated in your monitor instructions.  Clean area with 4 enclosed alcohol pads. Let dry.  Apply patch as indicated in monitor instructions. Patch will be placed under collarbone on left  side of chest with arrow pointing upward.  Rub patch adhesive wings for 2 minutes. Remove white label marked "1". Remove the white  label marked "2". Rub patch adhesive wings for 2 additional minutes.  While looking  in a mirror, press and release button in center of patch. A small green light will  flash 3-4 times. This will be your only indicator that the monitor has been turned on.  Do not shower for the first 24 hours. You may shower after the first 24 hours.  Press the button if you feel a symptom. You will hear a small click. Record Date, Time and  Symptom in the Patient Logbook.  When you are ready to remove the patch, follow instructions on the last 2 pages of Patient  Logbook. Stick patch monitor onto the last page of Patient Logbook.  Place Patient Logbook in the blue and white box. Use locking tab on box and tape box closed  securely. The blue and white box  has prepaid postage on it. Please place it in the mailbox as  soon as possible. Your physician should have your test results approximately 7 days after the  monitor has been mailed back to [facility].  Call [facility] at [phone] if you have questions regarding  your ZIO XT patch monitor. Call them immediately if you see an orange light blinking on your  monitor.  If your monitor falls off in less than 4 days, contact our Monitor department at [phone].  If your monitor becomes loose or falls off after 4 days call [employer] at [phone] for  suggestions on securing your monitor    Follow-Up: At [facility], you and your health needs are our priority.  As part of our continuing mission to provide you with exceptional heart care, we have created designated Provider Care Teams.  These Care Teams include your primary Cardiologist (physician) and Advanced Practice Providers (APPs -  Physician Assistants and Nurse Practitioners) who all work together to provide you with the care you need, when you need it.  We recommend signing up for the patient portal called "MyChart".  Sign up information is provided on this After Visit Summary.  MyChart is used to connect with patients for Virtual Visits (Telemedicine).  Patients are able to view lab/test results, encounter notes, upcoming appointments, etc.  Non-urgent messages can be sent to your provider as well.   To learn more about what you can do with MyChart, go to [email or web address].    Your next appointment:   3 month(s)  Provider:   Eleonore Chiquito, MD     Signed, Addison Naegeli. Audie Box, MD, [place]  [address] [place], [place] [address] [phone]  09/24/2022 4:36 PM

## 2022-09-24 ENCOUNTER — Ambulatory Visit: Payer: BC Managed Care – PPO | Attending: Cardiovascular Disease | Admitting: Cardiovascular Disease

## 2022-09-24 ENCOUNTER — Encounter: Payer: Self-pay | Admitting: Cardiovascular Disease

## 2022-09-24 ENCOUNTER — Ambulatory Visit: Payer: BC Managed Care – PPO | Attending: Cardiovascular Disease

## 2022-09-24 VITALS — BP 124/82 | HR 71 | Ht 70.0 in | Wt 213.0 lb

## 2022-09-24 DIAGNOSIS — R42 Dizziness and giddiness: Secondary | ICD-10-CM

## 2022-09-24 DIAGNOSIS — R531 Weakness: Secondary | ICD-10-CM | POA: Diagnosis not present

## 2022-09-24 DIAGNOSIS — R002 Palpitations: Secondary | ICD-10-CM

## 2022-09-24 DIAGNOSIS — F3132 Bipolar disorder, current episode depressed, moderate: Secondary | ICD-10-CM | POA: Diagnosis not present

## 2022-09-24 MED ORDER — PROPRANOLOL HCL 10 MG PO TABS: 10.0000 mg | ORAL_TABLET | Freq: Two times a day (BID) | ORAL | 1 refills | Status: DC

## 2022-09-24 NOTE — Patient Instructions (Signed)
Medication Instructions:   START Propanolol 10 mg twice daily   STOP Propanolol 60 mg 24 hour tablet.   *If you need a refill on your cardiac medications before your next appointment, please call your pharmacy*  Testing/Procedures:  Davis Monitor Instructions  Your physician has requested you wear a ZIO patch monitor for 3 days.  This is a single patch monitor. [employer] supplies one patch monitor per enrollment. Additional stickers are not available. Please do not apply patch if you will be having a Nuclear Stress Test,  Echocardiogram, Cardiac CT, MRI, or Chest Xray during the period you would be wearing the  monitor. The patch cannot be worn during these tests. You cannot remove and re-apply the  ZIO XT patch monitor.  Your ZIO patch monitor will be mailed 3 day USPS to your address on file. It may take 3-5 days  to receive your monitor after you have been enrolled.  Once you have received your monitor, please review the enclosed instructions. Your monitor  has already been registered assigning a specific monitor serial # to you.  Billing and Patient Assistance Program Information  We have supplied [employer] with any of your insurance information on file for billing purposes. [employer] offers a sliding scale Patient Assistance Program for patients that do not have  insurance, or whose insurance does not completely cover the cost of the ZIO monitor.  You must apply for the Patient Assistance Program to qualify for this discounted rate.  To apply, please call [employer] at [phone], select option 4, select option 2, ask to apply for  Patient Assistance Program. Theodore Demark will ask your household income, and how many people  are in your household. They will quote your out-of-pocket cost based on that information.  [employer] will also be able to set up a 67-month interest-free payment plan if needed.  Applying the monitor   Shave hair from upper left chest.  Hold abrader disc  by orange tab. Rub abrader in 40 strokes over the upper left chest as  indicated in your monitor instructions.  Clean area with 4 enclosed alcohol pads. Let dry.  Apply patch as indicated in monitor instructions. Patch will be placed under collarbone on left  side of chest with arrow pointing upward.  Rub patch adhesive wings for 2 minutes. Remove white label marked "1". Remove the white  label marked "2". Rub patch adhesive wings for 2 additional minutes.  While looking in a mirror, press and release button in center of patch. A small green light will  flash 3-4 times. This will be your only indicator that the monitor has been turned on.  Do not shower for the first 24 hours. You may shower after the first 24 hours.  Press the button if you feel a symptom. You will hear a small click. Record Date, Time and  Symptom in the Patient Logbook.  When you are ready to remove the patch, follow instructions on the last 2 pages of Patient  Logbook. Stick patch monitor onto the last page of Patient Logbook.  Place Patient Logbook in the blue and white box. Use locking tab on box and tape box closed  securely. The blue and white box has prepaid postage on it. Please place it in the mailbox as  soon as possible. Your physician should have your test results approximately 7 days after the  monitor has [facility]  Call [facility]at [phone] you have questions  regarding  your ZIO XT patch monitor. Call them immediately if you see an orange light blinking on your  monitor.  If your monitor falls off in less than 4 days, contact our Monitor department at [phone].  If your monitor becomes loose or falls off after 4 days call [employer] at [phone] for  suggestions on securing your monitor    Follow-Up: At [facility], you and your health needs are our priority.  As part of our continuing mission to provide you with exceptional heart care,  we have created designated Provider Care Teams.  These Care Teams include your primary Cardiologist (physician) and Advanced Practice Providers (APPs -  Physician Assistants and Nurse Practitioners) who all work together to provide you with the care you need, when you need it.  We recommend signing up for the patient portal called "MyChart".  Sign up information is provided on this After Visit Summary.  MyChart is used to connect with patients for Virtual Visits (Telemedicine).  Patients are able to view lab/test results, encounter notes, upcoming appointments, etc.  Non-urgent messages can be sent to your provider as well.   To learn more about what you can do with MyChart, go to [email or web address].    Your next appointment:   3 month(s)  Provider:   Eleonore Chiquito, MD

## 2022-09-24 NOTE — Progress Notes (Unsigned)
Enrolled for [employer] to mail a ZIO XT long term holter monitor to the patients address on file.  

## 2022-09-25 ENCOUNTER — Other Ambulatory Visit: Payer: Self-pay | Admitting: Otolaryngology

## 2022-09-25 DIAGNOSIS — F3181 Bipolar II disorder: Secondary | ICD-10-CM | POA: Diagnosis not present

## 2022-09-25 DIAGNOSIS — F410 Panic disorder [episodic paroxysmal anxiety] without agoraphobia: Secondary | ICD-10-CM | POA: Diagnosis not present

## 2022-09-26 DIAGNOSIS — F3132 Bipolar disorder, current episode depressed, moderate: Secondary | ICD-10-CM | POA: Diagnosis not present

## 2022-09-26 DIAGNOSIS — F3181 Bipolar II disorder: Secondary | ICD-10-CM | POA: Diagnosis not present

## 2022-09-27 DIAGNOSIS — F3132 Bipolar disorder, current episode depressed, moderate: Secondary | ICD-10-CM | POA: Diagnosis not present

## 2022-09-28 DIAGNOSIS — F3132 Bipolar disorder, current episode depressed, moderate: Secondary | ICD-10-CM | POA: Diagnosis not present

## 2022-10-01 ENCOUNTER — Ambulatory Visit (HOSPITAL_COMMUNITY): Admission: RE | Admit: 2022-10-01 | Payer: BC Managed Care – PPO | Source: Home / Self Care | Admitting: Otolaryngology

## 2022-10-01 ENCOUNTER — Encounter (HOSPITAL_COMMUNITY): Admission: RE | Payer: Self-pay | Source: Home / Self Care

## 2022-10-01 DIAGNOSIS — F3181 Bipolar II disorder: Secondary | ICD-10-CM | POA: Diagnosis not present

## 2022-10-01 SURGERY — TONSILLECTOMY
Anesthesia: General | Laterality: Bilateral

## 2022-10-02 DIAGNOSIS — F3181 Bipolar II disorder: Secondary | ICD-10-CM | POA: Diagnosis not present

## 2022-10-02 DIAGNOSIS — F3132 Bipolar disorder, current episode depressed, moderate: Secondary | ICD-10-CM | POA: Diagnosis not present

## 2022-10-02 DIAGNOSIS — R002 Palpitations: Secondary | ICD-10-CM | POA: Diagnosis not present

## 2022-10-03 DIAGNOSIS — F3132 Bipolar disorder, current episode depressed, moderate: Secondary | ICD-10-CM | POA: Diagnosis not present

## 2022-10-04 DIAGNOSIS — F3132 Bipolar disorder, current episode depressed, moderate: Secondary | ICD-10-CM | POA: Diagnosis not present

## 2022-10-05 DIAGNOSIS — F3132 Bipolar disorder, current episode depressed, moderate: Secondary | ICD-10-CM | POA: Diagnosis not present

## 2022-10-08 DIAGNOSIS — F3132 Bipolar disorder, current episode depressed, moderate: Secondary | ICD-10-CM | POA: Diagnosis not present

## 2022-10-08 DIAGNOSIS — F3181 Bipolar II disorder: Secondary | ICD-10-CM | POA: Diagnosis not present

## 2022-10-09 DIAGNOSIS — F3132 Bipolar disorder, current episode depressed, moderate: Secondary | ICD-10-CM | POA: Diagnosis not present

## 2022-10-10 DIAGNOSIS — F3181 Bipolar II disorder: Secondary | ICD-10-CM | POA: Diagnosis not present

## 2022-10-11 DIAGNOSIS — F3132 Bipolar disorder, current episode depressed, moderate: Secondary | ICD-10-CM | POA: Diagnosis not present

## 2022-10-12 DIAGNOSIS — F3132 Bipolar disorder, current episode depressed, moderate: Secondary | ICD-10-CM | POA: Diagnosis not present

## 2022-10-13 DIAGNOSIS — R002 Palpitations: Secondary | ICD-10-CM | POA: Diagnosis not present

## 2022-10-15 DIAGNOSIS — F3132 Bipolar disorder, current episode depressed, moderate: Secondary | ICD-10-CM | POA: Diagnosis not present

## 2022-10-16 DIAGNOSIS — F410 Panic disorder [episodic paroxysmal anxiety] without agoraphobia: Secondary | ICD-10-CM | POA: Diagnosis not present

## 2022-10-16 DIAGNOSIS — F3181 Bipolar II disorder: Secondary | ICD-10-CM | POA: Diagnosis not present

## 2022-10-17 DIAGNOSIS — F3181 Bipolar II disorder: Secondary | ICD-10-CM | POA: Diagnosis not present

## 2022-10-22 DIAGNOSIS — F3181 Bipolar II disorder: Secondary | ICD-10-CM | POA: Diagnosis not present

## 2022-10-26 DIAGNOSIS — F3181 Bipolar II disorder: Secondary | ICD-10-CM | POA: Diagnosis not present

## 2022-10-30 DIAGNOSIS — F3181 Bipolar II disorder: Secondary | ICD-10-CM | POA: Diagnosis not present

## 2022-11-01 DIAGNOSIS — F3181 Bipolar II disorder: Secondary | ICD-10-CM | POA: Diagnosis not present

## 2022-11-05 DIAGNOSIS — F3181 Bipolar II disorder: Secondary | ICD-10-CM | POA: Diagnosis not present

## 2022-11-06 ENCOUNTER — Encounter (HOSPITAL_COMMUNITY): Payer: Self-pay | Admitting: Otolaryngology

## 2022-11-06 ENCOUNTER — Other Ambulatory Visit: Payer: Self-pay | Admitting: Otolaryngology

## 2022-11-06 DIAGNOSIS — F3181 Bipolar II disorder: Secondary | ICD-10-CM | POA: Diagnosis not present

## 2022-11-06 DIAGNOSIS — F410 Panic disorder [episodic paroxysmal anxiety] without agoraphobia: Secondary | ICD-10-CM | POA: Diagnosis not present

## 2022-11-06 NOTE — Progress Notes (Signed)
PCP - Dr Warner Mccreedy Cardiologist - n/a Behavioral Health - Peggye Form  Chest x-ray - n/a EKG - 09/24/22 Stress Test - n/a ECHO - n/a Cardiac Cath - n/a  ICD Pacemaker/Loop - n/a  Sleep Study -  Yes - home test 06/2021 CPAP - does not use CPAP  Diabetes - n/a  STOP now taking any Aspirin (unless otherwise instructed by your surgeon), Aleve, Naproxen, Ibuprofen, Motrin, Advil, Goody's, BC's, all herbal medications, fish oil, and all vitamins.   Coronavirus Screening Do you have any of the following symptoms:  Cough yes/no: No Fever (>100.10F)  yes/no: No Runny nose yes/no: No Sore throat yes/no: No Difficulty breathing/shortness of breath  yes/no: No  Have you traveled in the last 14 days and where? yes/no: No  Patient verbalized understanding of instructions that were given via phone.

## 2022-11-07 ENCOUNTER — Observation Stay (HOSPITAL_COMMUNITY)
Admission: RE | Admit: 2022-11-07 | Discharge: 2022-11-08 | Disposition: A | Discharge status: Home / Self Care | Payer: BC Managed Care – PPO | Attending: Otolaryngology | Admitting: Otolaryngology

## 2022-11-07 ENCOUNTER — Other Ambulatory Visit: Payer: Self-pay

## 2022-11-07 ENCOUNTER — Encounter (HOSPITAL_COMMUNITY): Payer: Self-pay | Admitting: Otolaryngology

## 2022-11-07 ENCOUNTER — Ambulatory Visit (HOSPITAL_COMMUNITY): Payer: BC Managed Care – PPO | Admitting: Certified Registered Nurse Anesthetist

## 2022-11-07 ENCOUNTER — Encounter (HOSPITAL_COMMUNITY)
Admission: RE | Disposition: A | Discharge status: Home / Self Care | Payer: Self-pay | Source: Home / Self Care | Attending: Otolaryngology

## 2022-11-07 DIAGNOSIS — G4733 Obstructive sleep apnea (adult) (pediatric): Principal | ICD-10-CM | POA: Diagnosis present

## 2022-11-07 DIAGNOSIS — Z79899 Other long term (current) drug therapy: Secondary | ICD-10-CM | POA: Diagnosis not present

## 2022-11-07 HISTORY — DX: Sleep apnea, unspecified: G47.30

## 2022-11-07 HISTORY — PX: TONSILLECTOMY: SHX5217

## 2022-11-07 HISTORY — DX: Hyperlipidemia, unspecified: E78.5

## 2022-11-07 HISTORY — DX: Insomnia, unspecified: G47.00

## 2022-11-07 LAB — BASIC METABOLIC PANEL
Anion gap: 9 (ref 5–15)
BUN: 9 mg/dL (ref 6–20)
CO2: 21 mmol/L — ABNORMAL LOW (ref 22–32)
Calcium: 8.8 mg/dL — ABNORMAL LOW (ref 8.9–10.3)
Chloride: 108 mmol/L (ref 98–111)
Creatinine, Ser: 0.75 mg/dL (ref 0.61–1.24)
GFR, Estimated: >60 mL/min (ref >60)
Glucose, Bld: 96 mg/dL (ref 70–99)
Potassium: 4 mmol/L (ref 3.5–5.1)
Sodium: 138 mmol/L (ref 135–145)

## 2022-11-07 LAB — CBC
HCT: 45.3 % (ref 39.0–52.0)
Hemoglobin: 15.2 g/dL (ref 13.0–17.0)
MCH: 28.3 pg (ref 26.0–34.0)
MCHC: 33.6 g/dL (ref 30.0–36.0)
MCV: 84.2 fL (ref 80.0–100.0)
Platelets: 403 10*3/uL — ABNORMAL HIGH (ref 150–400)
RBC: 5.38 MIL/uL (ref 4.22–5.81)
RDW: 12.9 % (ref 11.5–15.5)
WBC: 9 10*3/uL (ref 4.0–10.5)
nRBC: 0 % (ref 0.0–0.2)

## 2022-11-07 SURGERY — TONSILLECTOMY
Anesthesia: General | Laterality: Bilateral

## 2022-11-07 MED ORDER — OXYCODONE HCL 5 MG PO TABS
5.0000 mg | ORAL_TABLET | Freq: Once | ORAL | Status: AC
  Administered 2022-11-07: 5 mg via ORAL

## 2022-11-07 MED ORDER — KCL IN DEXTROSE-NACL 20-5-0.45 MEQ/L-%-% IV SOLN
INTRAVENOUS | Status: DC
  Filled 2022-11-07: qty 1000

## 2022-11-07 MED ORDER — ROSUVASTATIN CALCIUM 5 MG PO TABS
5.0000 mg | ORAL_TABLET | Freq: Every day | ORAL | Status: DC
  Administered 2022-11-07 – 2022-11-08 (×2): 5 mg via ORAL
  Filled 2022-11-07 (×3): qty 1

## 2022-11-07 MED ORDER — PROPOFOL 10 MG/ML IV BOLUS
INTRAVENOUS | Status: DC | PRN
  Administered 2022-11-07: 20 mg via INTRAVENOUS
  Administered 2022-11-07: 180 mg via INTRAVENOUS

## 2022-11-07 MED ORDER — CHLORHEXIDINE GLUCONATE 0.12 % MT SOLN
15.0000 mL | Freq: Once | OROMUCOSAL | Status: AC
  Administered 2022-11-07: 15 mL via OROMUCOSAL
  Filled 2022-11-07: qty 15

## 2022-11-07 MED ORDER — HYDROXYZINE HCL 10 MG PO TABS: 10.0000 mg | ORAL_TABLET | Freq: Three times a day (TID) | ORAL | Status: DC | PRN

## 2022-11-07 MED ORDER — ACETAMINOPHEN 10 MG/ML IV SOLN
INTRAVENOUS | Status: DC | PRN
  Administered 2022-11-07: 1000 mg via INTRAVENOUS

## 2022-11-07 MED ORDER — OXYCODONE HCL 5 MG PO TABS
ORAL_TABLET | ORAL | Status: AC
  Filled 2022-11-07: qty 1

## 2022-11-07 MED ORDER — HYDROCODONE-ACETAMINOPHEN 7.5-325 MG/15ML PO SOLN
10.0000 mL | ORAL | Status: DC | PRN
  Administered 2022-11-07: 15 mL via ORAL
  Filled 2022-11-07: qty 15

## 2022-11-07 MED ORDER — ACETAMINOPHEN 10 MG/ML IV SOLN
INTRAVENOUS | Status: AC
  Filled 2022-11-07: qty 100

## 2022-11-07 MED ORDER — FENTANYL CITRATE (PF) 250 MCG/5ML IJ SOLN
INTRAMUSCULAR | Status: DC | PRN
  Administered 2022-11-07: 100 ug via INTRAVENOUS
  Administered 2022-11-07: 50 ug via INTRAVENOUS

## 2022-11-07 MED ORDER — PROMETHAZINE HCL 25 MG/ML IJ SOLN: 6.2500 mg | INTRAMUSCULAR | Status: DC | PRN

## 2022-11-07 MED ORDER — PROPOFOL 10 MG/ML IV BOLUS
INTRAVENOUS | Status: AC
  Filled 2022-11-07: qty 20

## 2022-11-07 MED ORDER — CLOMIPRAMINE HCL 25 MG PO CAPS
25.0000 mg | ORAL_CAPSULE | Freq: Every day | ORAL | Status: DC
  Administered 2022-11-07: 25 mg via ORAL
  Filled 2022-11-07 (×2): qty 1

## 2022-11-07 MED ORDER — ORAL CARE MOUTH RINSE: 15.0000 mL | Freq: Once | OROMUCOSAL | Status: AC

## 2022-11-07 MED ORDER — MIDAZOLAM HCL 2 MG/2ML IJ SOLN
INTRAMUSCULAR | Status: DC | PRN
  Administered 2022-11-07: 2 mg via INTRAVENOUS

## 2022-11-07 MED ORDER — SUCCINYLCHOLINE CHLORIDE 200 MG/10ML IV SOSY
PREFILLED_SYRINGE | INTRAVENOUS | Status: DC | PRN
  Administered 2022-11-07: 120 mg via INTRAVENOUS

## 2022-11-07 MED ORDER — CLONAZEPAM 0.5 MG PO TABS
1.0000 mg | ORAL_TABLET | Freq: Three times a day (TID) | ORAL | Status: DC | PRN
  Administered 2022-11-07: 1 mg via ORAL
  Filled 2022-11-07: qty 2

## 2022-11-07 MED ORDER — DEXAMETHASONE SODIUM PHOSPHATE 10 MG/ML IJ SOLN
INTRAMUSCULAR | Status: DC | PRN
  Administered 2022-11-07: 10 mg via INTRAVENOUS

## 2022-11-07 MED ORDER — IBUPROFEN 100 MG/5ML PO SUSP
400.0000 mg | Freq: Four times a day (QID) | ORAL | Status: DC | PRN
  Administered 2022-11-08: 400 mg via ORAL
  Filled 2022-11-07: qty 20

## 2022-11-07 MED ORDER — LIDOCAINE 2% (20 MG/ML) 5 ML SYRINGE
INTRAMUSCULAR | Status: DC | PRN
  Administered 2022-11-07: 100 mg via INTRAVENOUS

## 2022-11-07 MED ORDER — ZOLPIDEM TARTRATE 5 MG PO TABS
10.0000 mg | ORAL_TABLET | Freq: Every evening | ORAL | Status: DC | PRN
  Administered 2022-11-07: 10 mg via ORAL
  Filled 2022-11-07: qty 2

## 2022-11-07 MED ORDER — FENTANYL CITRATE (PF) 100 MCG/2ML IJ SOLN
INTRAMUSCULAR | Status: AC
  Filled 2022-11-07: qty 2

## 2022-11-07 MED ORDER — LITHIUM CARBONATE ER 300 MG PO TBCR
300.0000 mg | EXTENDED_RELEASE_TABLET | Freq: Two times a day (BID) | ORAL | Status: DC
  Administered 2022-11-07 (×2): 300 mg via ORAL
  Filled 2022-11-07 (×4): qty 1

## 2022-11-07 MED ORDER — 0.9 % SODIUM CHLORIDE (POUR BTL) OPTIME
TOPICAL | Status: DC | PRN
  Administered 2022-11-07: 1000 mL

## 2022-11-07 MED ORDER — PROPRANOLOL HCL 20 MG PO TABS
10.0000 mg | ORAL_TABLET | Freq: Two times a day (BID) | ORAL | Status: DC
  Administered 2022-11-07 – 2022-11-08 (×3): 10 mg via ORAL
  Filled 2022-11-07 (×3): qty 1

## 2022-11-07 MED ORDER — ONDANSETRON HCL 4 MG/2ML IJ SOLN
INTRAMUSCULAR | Status: DC | PRN
  Administered 2022-11-07: 4 mg via INTRAVENOUS

## 2022-11-07 MED ORDER — VORTIOXETINE HBR 5 MG PO TABS
15.0000 mg | ORAL_TABLET | Freq: Every day | ORAL | Status: DC
  Administered 2022-11-07 – 2022-11-08 (×2): 15 mg via ORAL
  Filled 2022-11-07 (×2): qty 3

## 2022-11-07 MED ORDER — DEXMEDETOMIDINE HCL IN NACL 80 MCG/20ML IV SOLN
INTRAVENOUS | Status: DC | PRN
  Administered 2022-11-07: 8 ug via BUCCAL
  Administered 2022-11-07: 12 ug via BUCCAL

## 2022-11-07 MED ORDER — GABAPENTIN 100 MG PO CAPS
100.0000 mg | ORAL_CAPSULE | Freq: Three times a day (TID) | ORAL | Status: DC
  Administered 2022-11-07 – 2022-11-08 (×3): 100 mg via ORAL
  Filled 2022-11-07 (×3): qty 1

## 2022-11-07 MED ORDER — FENTANYL CITRATE (PF) 250 MCG/5ML IJ SOLN
INTRAMUSCULAR | Status: AC
  Filled 2022-11-07: qty 5

## 2022-11-07 MED ORDER — MORPHINE SULFATE (PF) 2 MG/ML IV SOLN: 2.0000 mg | INTRAVENOUS | Status: DC | PRN

## 2022-11-07 MED ORDER — FENTANYL CITRATE (PF) 100 MCG/2ML IJ SOLN
25.0000 ug | INTRAMUSCULAR | Status: DC | PRN
  Administered 2022-11-07 (×2): 50 ug via INTRAVENOUS

## 2022-11-07 MED ORDER — LACTATED RINGERS IV SOLN: INTRAVENOUS | Status: DC

## 2022-11-07 MED ORDER — MIDAZOLAM HCL 2 MG/2ML IJ SOLN
INTRAMUSCULAR | Status: AC
  Filled 2022-11-07: qty 2

## 2022-11-07 SURGICAL SUPPLY — 35 items
BAG COUNTER SPONGE SURGICOUNT (BAG) ×1 IMPLANT
BAG SPNG CNTER NS LX DISP (BAG) ×1
BLADE SURG 15 STRL LF DISP TIS (BLADE) IMPLANT
BLADE SURG 15 STRL SS (BLADE)
CANISTER SUCT 3000ML PPV (MISCELLANEOUS) ×1 IMPLANT
CATH ROBINSON RED A/P 10FR (CATHETERS) IMPLANT
CLEANER TIP ELECTROSURG 2X2 (MISCELLANEOUS) ×1 IMPLANT
COAGULATOR SUCT SWTCH 10FR 6 (ELECTROSURGICAL) ×1 IMPLANT
DRAPE HALF SHEET 40X57 (DRAPES) IMPLANT
ELECT COATED BLADE 2.86 ST (ELECTRODE) ×1 IMPLANT
ELECT REM PT RETURN 9FT ADLT (ELECTROSURGICAL)
ELECT REM PT RETURN 9FT PED (ELECTROSURGICAL)
ELECTRODE REM PT RETRN 9FT PED (ELECTROSURGICAL) IMPLANT
ELECTRODE REM PT RTRN 9FT ADLT (ELECTROSURGICAL) IMPLANT
GAUZE 4X4 16PLY ~~LOC~~+RFID DBL (SPONGE) ×1 IMPLANT
GLOVE BIO SURGEON STRL SZ7.5 (GLOVE) ×1 IMPLANT
GOWN STRL REUS W/ TWL LRG LVL3 (GOWN DISPOSABLE) ×2 IMPLANT
GOWN STRL REUS W/TWL LRG LVL3 (GOWN DISPOSABLE) ×2
KIT BASIN OR (CUSTOM PROCEDURE TRAY) ×1 IMPLANT
KIT TURNOVER KIT B (KITS) ×1 IMPLANT
NDL HYPO 25GX1X1/2 BEV (NEEDLE) IMPLANT
NEEDLE HYPO 25GX1X1/2 BEV (NEEDLE) IMPLANT
NS IRRIG 1000ML POUR BTL (IV SOLUTION) ×1 IMPLANT
PACK BASIC III (CUSTOM PROCEDURE TRAY) ×1
PACK SRG BSC III STRL LF ECLPS (CUSTOM PROCEDURE TRAY) ×1 IMPLANT
PAD ARMBOARD 7.5X6 YLW CONV (MISCELLANEOUS) ×2 IMPLANT
PENCIL SMOKE EVACUATOR (MISCELLANEOUS) ×1 IMPLANT
POSITIONER HEAD DONUT 9IN (MISCELLANEOUS) IMPLANT
SPECIMEN JAR SMALL (MISCELLANEOUS) ×2 IMPLANT
SPONGE TONSIL 1.25 RF SGL STRG (GAUZE/BANDAGES/DRESSINGS) IMPLANT
SYR BULB EAR ULCER 3OZ GRN STR (SYRINGE) ×1 IMPLANT
TUBE CONNECTING 12X1/4 (SUCTIONS) ×1 IMPLANT
TUBE SALEM SUMP 14F (TUBING) ×1 IMPLANT
TUBE SALEM SUMP 16F (TUBING) IMPLANT
YANKAUER SUCT BULB TIP NO VENT (SUCTIONS) ×1 IMPLANT

## 2022-11-07 NOTE — Anesthesia Procedure Notes (Signed)
Procedure Name: Intubation Date/Time: 11/07/2022 11:05 AM  Performed by: Garfield Cornea, CRNAPre-anesthesia Checklist: Patient identified, Emergency Drugs available, Suction available and Patient being monitored Patient Re-evaluated:Patient Re-evaluated prior to induction Oxygen Delivery Method: Circle System Utilized Preoxygenation: Pre-oxygenation with 100% oxygen Induction Type: IV induction Ventilation: Mask ventilation without difficulty Laryngoscope Size: Mac and 4 Grade View: Grade I Tube type: Oral Rae Tube size: 7.5 mm Number of attempts: 1 Airway Equipment and Method: Stylet Placement Confirmation: ETT inserted through vocal cords under direct vision, positive ETCO2 and breath sounds checked- equal and bilateral Tube secured with: Tape Dental Injury: Teeth and Oropharynx as per pre-operative assessment

## 2022-11-07 NOTE — H&P (Signed)
Alexander Harrington is an 32 y.o. male.   Chief Complaint: Obstructive sleep apnea HPI: 32 year old male with obstructive sleep apnea who has had difficulty tolerating CPAP.  He underwent sleep endoscopy that demonstrated obstruction due to tonsillar enlargement and crowding.  Past Medical History:  Diagnosis Date   ADHD    Anxiety    Dr. Evelene Croon   Bipolar 2 disorder    Depression    HLD (hyperlipidemia)    Insomnia    Sleep apnea    do not use CPAP    Past Surgical History:  Procedure Laterality Date   DRUG INDUCED ENDOSCOPY N/A 08/28/2022   Procedure: DRUG INDUCED ENDOSCOPY;  Surgeon: Christia Reading, MD;  Location: [facility];  Service: ENT;  Laterality: N/A;   OTHER SURGICAL HISTORY     sedated ect treatment    Family History  Problem Relation Age of Onset   Arthritis Mother    Mental illness Mother    Depression Mother    Anxiety disorder Mother    Drug abuse Maternal Grandfather    Alcohol abuse Maternal Grandfather    Drug abuse Maternal Grandmother    Alcohol abuse Maternal Grandmother    Social History:  reports that he has never smoked. He has never used smokeless tobacco. He reports current alcohol use of about 2.0 - 4.0 standard drinks of alcohol per week. He reports current drug use. Frequency: 1.00 time per week. Drug: Marijuana.  Allergies:  Allergies  Allergen Reactions   Amphetamines Other (See Comments)    Caused depression that went away after stopping the amphetamine   Benzodiazepines Other (See Comments)    Got cloudy thinking, loss of memory, suicidal thinking on Klonopin.   Codeine Rash   Crestor [Rosuvastatin] Other (See Comments)    Myalgia    Medications Prior to Admission  Medication Sig Dispense Refill   clomiPRAMINE (ANAFRANIL) 25 MG capsule Take 25 mg by mouth at bedtime.     clonazePAM (KLONOPIN) 1 MG tablet Take 1 mg by mouth 3 (three) times daily as needed.     gabapentin (NEURONTIN) 100 MG capsule Take 100 mg by mouth 3 (three)  times daily.     lithium carbonate (LITHOBID) 300 MG CR tablet Take 300 mg by mouth 2 (two) times daily.     rosuvastatin (CRESTOR) 5 MG tablet TAKE 1 TABLET(5 MG) BY MOUTH DAILY 90 tablet 3   tiZANidine (ZANAFLEX) 4 MG tablet Take 4 mg by mouth 2 (two) times daily.     vortioxetine HBr (TRINTELLIX) 5 MG TABS tablet Take 15 mg by mouth daily.     zolpidem (AMBIEN) 10 MG tablet TAKE 1 TABLET(10 MG) BY MOUTH AT BEDTIME AS NEEDED FOR SLEEP 30 tablet 3   hydrOXYzine (ATARAX) 10 MG tablet Take 10 mg by mouth 3 (three) times daily as needed. (Patient not taking: Reported on 09/17/2022)     propranolol (INDERAL) 10 MG tablet Take 1 tablet (10 mg total) by mouth 2 (two) times daily. 180 tablet 1    Results for orders placed or performed during the hospital encounter of 11/07/22 (from the past 48 hour(s))  Basic metabolic panel per protocol     Status: Abnormal   Collection Time: 11/07/22  9:33 AM  Result Value Ref Range   Sodium 138 135 - 145 mmol/L   Potassium 4.0 3.5 - 5.1 mmol/L    Comment: HEMOLYSIS AT THIS LEVEL MAY AFFECT RESULT   Chloride 108 98 - 111 mmol/L  CO2 21 (L) 22 - 32 mmol/L   Glucose, Bld 96 70 - 99 mg/dL    Comment: Glucose reference range applies only to samples taken after fasting for at least 8 hours.   BUN 9 6 - 20 mg/dL   Creatinine, Ser 1.61 0.61 - 1.24 mg/dL   Calcium 8.8 (L) 8.9 - 10.3 mg/dL   GFR, Estimated >09 >60 mL/min    Comment: (NOTE) Calculated using the CKD-EPI Creatinine Equation (2021)    Anion gap 9 5 - 15    Comment: Performed at [facility], [address] [place], [place] [address]  CBC per protocol     Status: Abnormal   Collection Time: 11/07/22  9:33 AM  Result Value Ref Range   WBC 9.0 4.0 - 10.5 K/uL   RBC 5.38 4.22 - 5.81 MIL/uL   Hemoglobin 15.2 13.0 - 17.0 g/dL   HCT 81.1 91.4 - 78.2 %   MCV 84.2 80.0 - 100.0 fL   MCH 28.3 26.0 - 34.0 pg   MCHC 33.6 30.0 - 36.0 g/dL   RDW 95.6 21.3 - 08.6 %   Platelets 403 (H) 150 - 400 K/uL    nRBC 0.0 0.0 - 0.2 %    Comment: Performed at [facility], [address], [place], [place] [address]   No results found.  Review of Systems  All other systems reviewed and are negative.   Blood pressure 119/86, pulse 80, temperature 98.7 F (37.1 C), temperature source Oral, resp. rate 16, height  (1.778 m), weight 96.6 kg, SpO2 96 %. Physical Exam Constitutional:      Appearance: Normal appearance. He is normal weight.  HENT:     Head: Normocephalic and atraumatic.     Right Ear: External ear normal.     Left Ear: External ear normal.     Nose: Nose normal.     Mouth/Throat:     Mouth: Mucous membranes are moist.     Pharynx: Oropharynx is clear.  Eyes:     Extraocular Movements: Extraocular movements intact.     Conjunctiva/sclera: Conjunctivae normal.     Pupils: Pupils are equal, round, and reactive to light.  Cardiovascular:     Rate and Rhythm: Normal rate.  Pulmonary:     Effort: Pulmonary effort is normal.  Musculoskeletal:     Cervical back: Normal range of motion.  Skin:    General: Skin is warm and dry.  Neurological:     General: No focal deficit present.     Mental Status: He is alert and oriented to person, place, and time.  Psychiatric:        Mood and Affect: Mood normal.        Behavior: Behavior normal.        Thought Content: Thought content normal.        Judgment: Judgment normal.      Assessment/Plan Tonsillar hypertrophy, obstructive sleep apnea  To OR for tonsillectomy.  Likely overnight observation.  Christia Reading, MD 11/07/2022, 10:40 AM

## 2022-11-07 NOTE — Anesthesia Postprocedure Evaluation (Signed)
Anesthesia Post Note  Patient: Alexander Harrington  Procedure(s) Performed: TONSILLECTOMY (Bilateral)     Patient location during evaluation: PACU Anesthesia Type: General Level of consciousness: awake and alert Pain management: pain level controlled Vital Signs Assessment: post-procedure vital signs reviewed and stable Respiratory status: spontaneous breathing, nonlabored ventilation, respiratory function stable and patient connected to nasal cannula oxygen Cardiovascular status: blood pressure returned to baseline and stable Postop Assessment: no apparent nausea or vomiting Anesthetic complications: no   No notable events documented.  Last Vitals:  Vitals:   11/07/22 1341 11/07/22 1414  BP: 117/83 (!) 123/93  Pulse: 74 78  Resp: 17 17  Temp: 36.6 C 36.6 C  SpO2: 94% 98%    Last Pain:  Vitals:   11/07/22 1600  TempSrc:   PainSc: 3                  Collene Schlichter

## 2022-11-07 NOTE — Transfer of Care (Signed)
Immediate Anesthesia Transfer of Care Note  Patient: Alexander Harrington  Procedure(s) Performed: TONSILLECTOMY (Bilateral)  Patient Location: PACU  Anesthesia Type:General  Level of Consciousness: awake, alert , and oriented  Airway & Oxygen Therapy: Patient Spontanous Breathing  Post-op Assessment: Report given to RN and Post -op Vital signs reviewed and stable  Post vital signs: Reviewed and stable  Last Vitals:  Vitals Value Taken Time  BP 110/78 11/07/22 1139  Temp    Pulse 75 11/07/22 1140  Resp 18 11/07/22 1140  SpO2 98 % 11/07/22 1140  Vitals shown include unvalidated device data.  Last Pain:  Vitals:   11/07/22 0926  TempSrc:   PainSc: 0-No pain      Patients Stated Pain Goal: 0 (11/07/22 0926)  Complications: No notable events documented.

## 2022-11-07 NOTE — Anesthesia Preprocedure Evaluation (Addendum)
Anesthesia Evaluation  Patient identified by MRN, date of birth, ID band Patient awake    Reviewed: Allergy & Precautions, NPO status , Patient's Chart, lab work & pertinent test results, reviewed documented beta blocker date and time   Airway Mallampati: IV  TM Distance: >3 FB Neck ROM: Full    Dental  (+) Teeth Intact, Dental Advisory Given   Pulmonary sleep apnea    Pulmonary exam normal breath sounds clear to auscultation       Cardiovascular negative cardio ROS Normal cardiovascular exam Rhythm:Regular Rate:Normal     Neuro/Psych  PSYCHIATRIC DISORDERS Anxiety Depression Bipolar Disorder   negative neurological ROS     GI/Hepatic negative GI ROS, Neg liver ROS,,,  Endo/Other  Hypothyroidism    Renal/GU negative Renal ROS     Musculoskeletal negative musculoskeletal ROS (+)    Abdominal   Peds  (+) ADHD Hematology negative hematology ROS (+)   Anesthesia Other Findings Day of surgery medications reviewed with the patient.  Reproductive/Obstetrics                             Anesthesia Physical Anesthesia Plan  ASA: 2  Anesthesia Plan: General   Post-op Pain Management: Tylenol PO (pre-op)*   Induction: Intravenous  PONV Risk Score and Plan: 2 and Midazolam, Dexamethasone and Ondansetron  Airway Management Planned: Oral ETT  Additional Equipment:   Intra-op Plan:   Post-operative Plan: Extubation in OR  Informed Consent: I have reviewed the patients History and Physical, chart, labs and discussed the procedure including the risks, benefits and alternatives for the proposed anesthesia with the patient or authorized representative who has indicated his/her understanding and acceptance.     Dental advisory given  Plan Discussed with: CRNA  Anesthesia Plan Comments:        Anesthesia Quick Evaluation

## 2022-11-07 NOTE — Op Note (Signed)
Preop diagnosis: Obstructive sleep apnea Postop diagnosis: same Procedure: Tonsillectomy Surgeon: Jenne Pane Anesth: General Compl: None Findings: Tonsils 2+. Description:  After discussing risks, benefits, and alternatives, the patient was brought to the operative suite and placed on the operative table in the supine position.  Anesthesia was induced and the patient was intubated by the anesthesia team without difficulty.  The bed was turned 90 degrees from anesthesia and the eyes were taped closed.  The patient was given IV Decadron.  A head wrap was placed around the patient's head and the oropharynx was exposed with a Crow-Davis retractor that was placed in suspension on the Mayo stand.   The right tonsil was grasped with a curved Allis and retracted medially while a curvilinear incision was made with the Bovie electrocautery.  Dissection continued in the subcapsular plane until the tonsil was removed.  The same procedure was then performed on the left side.  Tonsils were not sent for pathology.  Bleeding was controlled using suction cautery.  The mouth and nose were copiously irrigated with saline.  A flexible suction catheter was passed down the esophagus to suction out the stomach and esophagus.  The Crow-Davis retractor was taken out of suspension and removed from the patient's mouth.  He was then turned back to anesthesia for wake-up and was extubated and moved to the recovery room in stable condition.

## 2022-11-07 NOTE — Brief Op Note (Signed)
11/07/2022  11:23 AM  PATIENT:  Alexander Harrington  32 y.o. male  PRE-OPERATIVE DIAGNOSIS:  obstructive sleep apnea  POST-OPERATIVE DIAGNOSIS:  obstructive sleep apnea  PROCEDURE:  Procedure(s): TONSILLECTOMY (Bilateral)  SURGEON:  Surgeon(s) and Role:    Christia Reading, MD - Primary  PHYSICIAN ASSISTANT:   ASSISTANTS: none   ANESTHESIA:   general  EBL:  Minimal   BLOOD ADMINISTERED:none  DRAINS: none   LOCAL MEDICATIONS USED:  NONE  SPECIMEN:  No Specimen  DISPOSITION OF SPECIMEN:  N/A  COUNTS:  YES  TOURNIQUET:  * No tourniquets in log *  DICTATION: .Note written in EPIC  PLAN OF CARE: Admit for overnight observation  PATIENT DISPOSITION:  PACU - hemodynamically stable.   Delay start of Pharmacological VTE agent (>24hrs) due to surgical blood loss or risk of bleeding: yes

## 2022-11-08 ENCOUNTER — Encounter (HOSPITAL_COMMUNITY): Payer: Self-pay | Admitting: Otolaryngology

## 2022-11-08 DIAGNOSIS — G4733 Obstructive sleep apnea (adult) (pediatric): Secondary | ICD-10-CM | POA: Diagnosis not present

## 2022-11-08 DIAGNOSIS — Z79899 Other long term (current) drug therapy: Secondary | ICD-10-CM | POA: Diagnosis not present

## 2022-11-08 MED ORDER — HYDROCODONE-ACETAMINOPHEN 7.5-325 MG/15ML PO SOLN: 15.0000 mL | ORAL | 0 refills | Status: DC | PRN

## 2022-11-08 NOTE — Plan of Care (Signed)
Patient ID: Rishawn Walck, male   DOB: 03-31-1991, 32 y.o.   MRN: [id number]  Problem: Education: Goal: Knowledge of General Education information will improve Description: Including pain rating scale, medication(s)/side effects and non-pharmacologic comfort measures Outcome: Adequate for Discharge   Problem: Health Behavior/Discharge Planning: Goal: Ability to manage health-related needs will improve Outcome: Adequate for Discharge   Problem: Clinical Measurements: Goal: Ability to maintain clinical measurements within normal limits will improve Outcome: Adequate for Discharge Goal: Will remain free from infection Outcome: Adequate for Discharge Goal: Diagnostic test results will improve Outcome: Adequate for Discharge Goal: Respiratory complications will improve Outcome: Adequate for Discharge Goal: Cardiovascular complication will be avoided Outcome: Adequate for Discharge   Problem: Activity: Goal: Risk for activity intolerance will decrease Outcome: Adequate for Discharge   Problem: Nutrition: Goal: Adequate nutrition will be maintained Outcome: Adequate for Discharge   Problem: Coping: Goal: Level of anxiety will decrease Outcome: Adequate for Discharge   Problem: Elimination: Goal: Will not experience complications related to bowel motility Outcome: Adequate for Discharge Goal: Will not experience complications related to urinary retention Outcome: Adequate for Discharge   Problem: Pain Managment: Goal: General experience of comfort will improve Outcome: Adequate for Discharge   Problem: Safety: Goal: Ability to remain free from injury will improve Outcome: Adequate for Discharge   Problem: Skin Integrity: Goal: Risk for impaired skin integrity will decrease Outcome: Adequate for Discharge  Lidia Collum, RN

## 2022-11-08 NOTE — Plan of Care (Signed)
Patient AOX4, VSS throughout shift.  All meds given on time as ordered.  Pt voided in bathroom.  Diminished lungs, IS encouraged.  POC maintained, will continue to monitor.  Problem: Education: Goal: Knowledge of General Education information will improve Description: Including pain rating scale, medication(s)/side effects and non-pharmacologic comfort measures Outcome: Progressing   Problem: Health Behavior/Discharge Planning: Goal: Ability to manage health-related needs will improve Outcome: Progressing   Problem: Clinical Measurements: Goal: Ability to maintain clinical measurements within normal limits will improve Outcome: Progressing Goal: Will remain free from infection Outcome: Progressing Goal: Diagnostic test results will improve Outcome: Progressing Goal: Respiratory complications will improve Outcome: Progressing Goal: Cardiovascular complication will be avoided Outcome: Progressing   Problem: Activity: Goal: Risk for activity intolerance will decrease Outcome: Progressing   Problem: Nutrition: Goal: Adequate nutrition will be maintained Outcome: Progressing   Problem: Coping: Goal: Level of anxiety will decrease Outcome: Progressing   Problem: Elimination: Goal: Will not experience complications related to bowel motility Outcome: Progressing Goal: Will not experience complications related to urinary retention Outcome: Progressing   Problem: Pain Managment: Goal: General experience of comfort will improve Outcome: Progressing   Problem: Safety: Goal: Ability to remain free from injury will improve Outcome: Progressing   Problem: Skin Integrity: Goal: Risk for impaired skin integrity will decrease Outcome: Progressing

## 2022-11-08 NOTE — Discharge Summary (Signed)
Physician Discharge Summary  Patient ID: Alexander Harrington MRN: [id number] DOB/AGE: April 13, 1991 32 y.o.  Admit date: 11/07/2022 Discharge date: 11/08/2022  Admission Diagnoses: Obstructive sleep apnea  Discharge Diagnoses:  Principal Problem:   OSA (obstructive sleep apnea)   Discharged Condition: good  Hospital Course: 32 year old male with obstructive sleep apnea presented for tonsillectomy.  See operative note.  He was observed overnight and did well.  He is felt stable for discharge on POD 1.  Consults: None  Significant Diagnostic Studies: None  Treatments: surgery: Tonsillectomy  Discharge Exam: Blood pressure 124/89, pulse (!) 101, temperature 98.2 F (36.8 C), temperature source Oral, resp. rate 18, height  (1.778 m), weight 96.6 kg, SpO2 99 %. General appearance: alert, cooperative, and no distress Throat: no bleeding  Disposition: Discharge disposition: 01-Home or Self Care       Discharge Instructions     Diet - low sodium heart healthy   Complete by: As directed    Discharge instructions   Complete by: As directed    Drink plenty of fluids, advance diet as able.  Treat pain with prescription but can supplement with ibuprofen.  Call with inability to drink, high fevers, or significant throat bleeding.   Increase activity slowly   Complete by: As directed       Allergies as of 11/08/2022       Reactions   Codeine Rash   Crestor [rosuvastatin] Other (See Comments)   Myalgia        Medication List     TAKE these medications    Aleve 220 MG Caps Generic drug: Naproxen Sodium Take 220-440 mg by mouth 2 (two) times daily as needed (for pain or headaches).   clomiPRAMINE 25 MG capsule Commonly known as: ANAFRANIL Take 25 mg by mouth See admin instructions. Beginning on 11/07/2022, take 25 mg by mouth at bedtime for 7 nights, then stop   clonazePAM 1 MG tablet Commonly known as: KLONOPIN Take 1 mg by mouth 3 (three) times daily as needed for  anxiety.   gabapentin 100 MG capsule Commonly known as: NEURONTIN Take 100 mg by mouth 3 (three) times daily as needed (for anxiety).   HYDROcodone-acetaminophen 7.5-325 mg/15 ml solution Commonly known as: HYCET Take 15 mLs by mouth every 4 (four) hours as needed for moderate pain.   lithium carbonate 300 MG ER tablet Commonly known as: LITHOBID Take 900 mg by mouth at bedtime.   propranolol 10 MG tablet Commonly known as: INDERAL Take 1 tablet (10 mg total) by mouth 2 (two) times daily.   rosuvastatin 5 MG tablet Commonly known as: CRESTOR TAKE 1 TABLET(5 MG) BY MOUTH DAILY What changed: See the new instructions.   Trintellix 5 MG Tabs tablet Generic drug: vortioxetine HBr Take 5 mg by mouth See admin instructions. Beginning on 11/07/2022, take 5 mg by mouth once a day for 7 days, then stop   zolpidem 10 MG tablet Commonly known as: AMBIEN TAKE 1 TABLET(10 MG) BY MOUTH AT BEDTIME AS NEEDED FOR SLEEP What changed: See the new instructions.        Follow-up Information     Christia Reading, MD Follow up in 1 month(s).   Specialty: Otolaryngology Contact information: [address] [place] [address] [phone]                 Signed: Christia Reading 11/08/2022, 7:55 AM

## 2022-11-14 DIAGNOSIS — F3181 Bipolar II disorder: Secondary | ICD-10-CM | POA: Diagnosis not present

## 2022-11-17 ENCOUNTER — Emergency Department (HOSPITAL_COMMUNITY)
Admission: EM | Admit: 2022-11-17 | Discharge: 2022-11-19 | Disposition: A | Discharge status: Home / Self Care | Payer: BC Managed Care – PPO | Attending: Emergency Medicine | Admitting: Emergency Medicine

## 2022-11-17 ENCOUNTER — Encounter (HOSPITAL_COMMUNITY): Payer: Self-pay

## 2022-11-17 DIAGNOSIS — Z20822 Contact with and (suspected) exposure to covid-19: Secondary | ICD-10-CM | POA: Insufficient documentation

## 2022-11-17 DIAGNOSIS — F3130 Bipolar disorder, current episode depressed, mild or moderate severity, unspecified: Secondary | ICD-10-CM | POA: Diagnosis present

## 2022-11-17 DIAGNOSIS — T1491XA Suicide attempt, initial encounter: Secondary | ICD-10-CM

## 2022-11-17 DIAGNOSIS — E039 Hypothyroidism, unspecified: Secondary | ICD-10-CM | POA: Insufficient documentation

## 2022-11-17 DIAGNOSIS — X838XXA Intentional self-harm by other specified means, initial encounter: Secondary | ICD-10-CM | POA: Diagnosis not present

## 2022-11-17 DIAGNOSIS — R0902 Hypoxemia: Secondary | ICD-10-CM | POA: Diagnosis not present

## 2022-11-17 DIAGNOSIS — T426X2A Poisoning by other antiepileptic and sedative-hypnotic drugs, intentional self-harm, initial encounter: Secondary | ICD-10-CM | POA: Diagnosis not present

## 2022-11-17 DIAGNOSIS — R799 Abnormal finding of blood chemistry, unspecified: Secondary | ICD-10-CM | POA: Diagnosis not present

## 2022-11-17 DIAGNOSIS — R9431 Abnormal electrocardiogram [ECG] [EKG]: Secondary | ICD-10-CM | POA: Diagnosis not present

## 2022-11-17 DIAGNOSIS — T50902A Poisoning by unspecified drugs, medicaments and biological substances, intentional self-harm, initial encounter: Secondary | ICD-10-CM | POA: Diagnosis not present

## 2022-11-17 DIAGNOSIS — T50904A Poisoning by unspecified drugs, medicaments and biological substances, undetermined, initial encounter: Secondary | ICD-10-CM | POA: Diagnosis not present

## 2022-11-17 DIAGNOSIS — R45851 Suicidal ideations: Secondary | ICD-10-CM | POA: Diagnosis not present

## 2022-11-17 DIAGNOSIS — T887XXA Unspecified adverse effect of drug or medicament, initial encounter: Secondary | ICD-10-CM | POA: Diagnosis not present

## 2022-11-17 LAB — RAPID URINE DRUG SCREEN, HOSP PERFORMED
Amphetamines: NOT DETECTED
Barbiturates: NOT DETECTED
Benzodiazepines: POSITIVE — AB
Cocaine: NOT DETECTED
Opiates: POSITIVE — AB
Tetrahydrocannabinol: POSITIVE — AB

## 2022-11-17 LAB — COMPREHENSIVE METABOLIC PANEL
ALT: 35 U/L (ref 0–44)
AST: 28 U/L (ref 15–41)
Albumin: 3.7 g/dL (ref 3.5–5.0)
Alkaline Phosphatase: 44 U/L (ref 38–126)
Anion gap: 10 (ref 5–15)
BUN: 9 mg/dL (ref 6–20)
CO2: 21 mmol/L — ABNORMAL LOW (ref 22–32)
Calcium: 9.1 mg/dL (ref 8.9–10.3)
Chloride: 106 mmol/L (ref 98–111)
Creatinine, Ser: 0.82 mg/dL (ref 0.61–1.24)
GFR, Estimated: >60 mL/min (ref >60)
Glucose, Bld: 89 mg/dL (ref 70–99)
Potassium: 3.5 mmol/L (ref 3.5–5.1)
Sodium: 137 mmol/L (ref 135–145)
Total Bilirubin: 0.3 mg/dL (ref 0.3–1.2)
Total Protein: 7.1 g/dL (ref 6.5–8.1)

## 2022-11-17 LAB — CBC WITH DIFFERENTIAL/PLATELET
Abs Immature Granulocytes: 0.11 10*3/uL — ABNORMAL HIGH (ref 0.00–0.07)
Basophils Absolute: 0.1 10*3/uL (ref 0.0–0.1)
Basophils Relative: 1 %
Eosinophils Absolute: 0.5 10*3/uL (ref 0.0–0.5)
Eosinophils Relative: 3 %
HCT: 43.8 % (ref 39.0–52.0)
Hemoglobin: 14.2 g/dL (ref 13.0–17.0)
Immature Granulocytes: 1 %
Lymphocytes Relative: 29 %
Lymphs Abs: 4.6 10*3/uL — ABNORMAL HIGH (ref 0.7–4.0)
MCH: 27.4 pg (ref 26.0–34.0)
MCHC: 32.4 g/dL (ref 30.0–36.0)
MCV: 84.6 fL (ref 80.0–100.0)
Monocytes Absolute: 1.3 10*3/uL — ABNORMAL HIGH (ref 0.1–1.0)
Monocytes Relative: 8 %
Neutro Abs: 9.3 10*3/uL — ABNORMAL HIGH (ref 1.7–7.7)
Neutrophils Relative %: 58 %
Platelets: 432 10*3/uL — ABNORMAL HIGH (ref 150–400)
RBC: 5.18 MIL/uL (ref 4.22–5.81)
RDW: 12.3 % (ref 11.5–15.5)
WBC: 15.9 10*3/uL — ABNORMAL HIGH (ref 4.0–10.5)
nRBC: 0 % (ref 0.0–0.2)

## 2022-11-17 LAB — ACETAMINOPHEN LEVEL
Acetaminophen (Tylenol), Serum: <10 ug/mL — ABNORMAL LOW (ref 10–30)
Acetaminophen (Tylenol), Serum: <10 ug/mL — ABNORMAL LOW (ref 10–30)

## 2022-11-17 LAB — CBG MONITORING, ED: Glucose-Capillary: 98 mg/dL (ref 70–99)

## 2022-11-17 LAB — ETHANOL: Alcohol, Ethyl (B): <10 mg/dL (ref <10)

## 2022-11-17 LAB — LITHIUM LEVEL: Lithium Lvl: 0.38 mmol/L — ABNORMAL LOW (ref 0.60–1.20)

## 2022-11-17 LAB — SALICYLATE LEVEL: Salicylate Lvl: <7 mg/dL — ABNORMAL LOW (ref 7.0–30.0)

## 2022-11-17 MED ORDER — CLONAZEPAM 0.5 MG PO TABS
0.5000 mg | ORAL_TABLET | Freq: Two times a day (BID) | ORAL | Status: DC
  Administered 2022-11-17 – 2022-11-19 (×4): 0.5 mg via ORAL
  Filled 2022-11-17 (×4): qty 1

## 2022-11-17 MED ORDER — CLONAZEPAM 0.5 MG PO TABS
0.5000 mg | ORAL_TABLET | Freq: Once | ORAL | Status: AC
  Administered 2022-11-17: 0.5 mg via ORAL
  Filled 2022-11-17: qty 1

## 2022-11-17 MED ORDER — CHARCOAL ACTIVATED PO LIQD: 50.0000 g | Freq: Once | ORAL | Status: DC

## 2022-11-17 MED ORDER — SODIUM CHLORIDE 0.9 % IV BOLUS
1000.0000 mL | Freq: Once | INTRAVENOUS | Status: AC
  Administered 2022-11-17: 1000 mL via INTRAVENOUS

## 2022-11-17 MED ORDER — HYDROCODONE-ACETAMINOPHEN 7.5-325 MG PO TABS
1.0000 | ORAL_TABLET | Freq: Three times a day (TID) | ORAL | Status: DC | PRN
  Administered 2022-11-17 – 2022-11-19 (×4): 1 via ORAL
  Filled 2022-11-17 (×4): qty 1

## 2022-11-17 MED ORDER — LITHIUM CARBONATE ER 450 MG PO TBCR
900.0000 mg | EXTENDED_RELEASE_TABLET | Freq: Every day | ORAL | Status: DC
  Administered 2022-11-17 – 2022-11-18 (×2): 900 mg via ORAL
  Filled 2022-11-17 (×2): qty 2

## 2022-11-17 MED ORDER — SODIUM CHLORIDE 0.9 % IV SOLN: INTRAVENOUS | Status: DC

## 2022-11-17 MED ORDER — CLONAZEPAM 0.5 MG PO TABS
1.0000 mg | ORAL_TABLET | Freq: Once | ORAL | Status: AC
  Administered 2022-11-17: 1 mg via ORAL
  Filled 2022-11-17: qty 2

## 2022-11-17 NOTE — Progress Notes (Signed)
CSW requested [facility] Antoinette Cillo, RN to review. Pt meets inpatient criteria per Earney Navy, NP.   Maryjean Ka, MSW, [facility] 11/17/2022 3:55 PM

## 2022-11-17 NOTE — ED Triage Notes (Addendum)
Pt arrieved from home via [facility] pt reports taking 15 10mg   pills of Ambien. Pt reports suicide attempt. Pt reports fight with his father that prompted suicide attempt.   Pt received 400 of ns  from ems. Via 20 in l ac.  Bp 112/68 Spo2 99 on ra Co2 34 Rr 12  Cbg 88

## 2022-11-17 NOTE — ED Provider Notes (Signed)
[place] EMERGENCY DEPARTMENT AT [facility] Provider Note  CSN: [id number] Arrival date & time: 11/17/22 4098  Chief Complaint(s) Suicide Attempt  HPI Alexander Harrington is a 32 y.o. male with a past medical history listed below including bipolar disorder, depression who presents to the emergency department after suicide attempt by overdosing on approximately 200 mg of Ativan around 4:30 AM.  Patient reports that he was having thoughts of suicide throughout the day.  He was discussing this with his father and told his father that there was a 75% chance that he would commit suicide.  The argument got heated and patient took the medication at that time.  EMS was called immediately.  Patient is denying any HI.  No AVH.  He has had prior suicide attempt in the past.   The history is provided by the patient and the EMS personnel.    Past Medical History Past Medical History:  Diagnosis Date   ADHD    Anxiety    Dr. Evelene Croon   Bipolar 2 disorder [facility])    Depression    HLD (hyperlipidemia)    Insomnia    Sleep apnea    do not use CPAP   Patient Active Problem List   Diagnosis Date Noted   OSA (obstructive sleep apnea) 11/07/2022   Hyperlipidemia 06/03/2020   Bipolar I disorder, most recent episode depressed (HCC) 08/07/2017    Class: Chronic   Hoarseness of voice 10/04/2015   Tachycardia 08/30/2015   Leukocytosis 02/08/2015   Other specified hypothyroidism 02/08/2015   MDD (major depressive disorder), recurrent episode, severe (HCC) 12/01/2013   Substance induced mood disorder (HCC) 04/11/2012    Class: Acute   Severe episode of recurrent major depressive disorder (HCC) 04/09/2012    Class: Acute   Keratosis pilaris 02/21/2011   Home Medication(s) Prior to Admission medications   Medication Sig Start Date End Date Taking? Authorizing Provider  ALEVE 220 MG CAPS Take 220-440 mg by mouth 2 (two) times daily as needed (for pain or headaches).    [provider]   clomiPRAMINE (ANAFRANIL) 25 MG capsule Take 25 mg by mouth See admin instructions. Beginning on 11/07/2022, take 25 mg by mouth at bedtime for 7 nights, then stop 11/07/22 11/14/22  [provider]  clonazePAM (KLONOPIN) 1 MG tablet Take 1 mg by mouth 3 (three) times daily as needed for anxiety. 05/25/20   [provider]  gabapentin (NEURONTIN) 100 MG capsule Take 100 mg by mouth 3 (three) times daily as needed (for anxiety). 09/04/22   [provider]  HYDROcodone-acetaminophen (HYCET) 7.5-325 mg/15 ml solution Take 15 mLs by mouth every 4 (four) hours as needed for moderate pain. 11/08/22   Christia Reading, MD  lithium carbonate (LITHOBID) 300 MG CR tablet Take 900 mg by mouth at bedtime. 11/07/21   [provider]  propranolol (INDERAL) 10 MG tablet Take 1 tablet (10 mg total) by mouth 2 (two) times daily. 09/24/22   O'NealRonnald Ramp, MD  rosuvastatin (CRESTOR) 5 MG tablet TAKE 1 TABLET(5 MG) BY MOUTH DAILY Patient taking differently: Take 5 mg by mouth at bedtime. 05/31/22   Copland, Gwenlyn Found, MD  zolpidem (AMBIEN) 10 MG tablet TAKE 1 TABLET(10 MG) BY MOUTH AT BEDTIME AS NEEDED FOR SLEEP Patient taking differently: Take 10 mg by mouth at bedtime. 08/22/22   Tomma Lightning, MD  Allergies Codeine and Crestor [rosuvastatin]  Review of Systems Review of Systems As noted in HPI  Physical Exam Vital Signs  I have reviewed the triage vital signs BP (!) 128/94   Pulse 80   Resp 14   Ht 5\' 10"  (1.778 m)   Wt 96.6 kg   SpO2 99%   BMI 30.56 kg/m   Physical Exam Vitals reviewed.  Constitutional:      General: He is not in acute distress.    Appearance: He is well-developed. He is not diaphoretic.  HENT:     Head: Normocephalic and atraumatic.     Right Ear: External ear normal.     Left Ear: External ear normal.     Nose:  Nose normal.     Mouth/Throat:     Mouth: Mucous membranes are moist.  Eyes:     General: No scleral icterus.    Conjunctiva/sclera: Conjunctivae normal.  Neck:     Trachea: Phonation normal.  Cardiovascular:     Rate and Rhythm: Normal rate and regular rhythm.  Pulmonary:     Effort: Pulmonary effort is normal. No respiratory distress.     Breath sounds: No stridor.  Abdominal:     General: There is no distension.  Musculoskeletal:        General: Normal range of motion.     Cervical back: Normal range of motion.  Neurological:     Mental Status: He is alert and oriented to person, place, and time.  Psychiatric:        Mood and Affect: Mood is depressed. Affect is labile.        Behavior: Behavior normal.        Thought Content: Thought content includes suicidal ideation.     ED Results and Treatments Labs (all labs ordered are listed, but only abnormal results are displayed) Labs Reviewed  CBC WITH DIFFERENTIAL/PLATELET - Abnormal; Notable for the following components:      Result Value   WBC 15.9 (*)    Platelets 432 (*)    Neutro Abs 9.3 (*)    Lymphs Abs 4.6 (*)    Monocytes Absolute 1.3 (*)    Abs Immature Granulocytes 0.11 (*)    All other components within normal limits  COMPREHENSIVE METABOLIC PANEL  SALICYLATE LEVEL  ACETAMINOPHEN LEVEL  ETHANOL  RAPID URINE DRUG SCREEN, HOSP PERFORMED  ACETAMINOPHEN LEVEL  CBG MONITORING, ED                                                                                                                         EKG  EKG Interpretation  Date/Time:    Ventricular Rate:    PR Interval:    QRS Duration:   QT Interval:    QTC Calculation:   R Axis:     Text Interpretation:         Radiology No results found.  Medications Ordered in ED Medications  sodium chloride 0.9 % bolus 1,000 mL (1,000  mLs Intravenous New Bag/Given 11/17/22 0613)    And  sodium chloride 0.9 % bolus 1,000 mL (has no administration in time  range)    And  0.9 %  sodium chloride infusion (has no administration in time range)                                                                                                                                     Procedures Procedures  (including critical care time)  Medical Decision Making / ED Course  Click here for ABCD2, HEART and other calculators  Medical Decision Making Amount and/or Complexity of Data Reviewed Labs: ordered. Decision-making details documented in ED Course. ECG/medicine tests: ordered and independent interpretation performed. Decision-making details documented in ED Course.  Risk Prescription drug management.    Patient presents after suicide attempt by overdosing on Ambien. He is currently hemodynamically stable. Over dose labs ordered Poison control consulted who recommended 6 hours monitoring with 4-hour repeat Tylenol level. IVC paperwork filed.  EKG without acute ischemic changes, dysrhythmias, blocks, interval changes. CBC with leukocytosis.  No anemia. Currently awaiting the rest of the labs.    Patient care turned over to oncoming provider. Patient case and results discussed in detail; please see their note for further ED managment.       Final Clinical Impression(s) / ED Diagnoses Final diagnoses:  None           This chart was dictated using voice recognition software.  Despite best efforts to proofread,  errors can occur which can change the documentation meaning.    Nira Conn, MD 11/17/22 [phone]

## 2022-11-17 NOTE — ED Notes (Signed)
Pt clothes and phone placed in pt belongings cubord 1-4 . Pt dressed in maroon scrubs

## 2022-11-17 NOTE — ED Notes (Signed)
Provided poison control with update

## 2022-11-17 NOTE — ED Notes (Signed)
Patient requesting pain medication and ambien. Dr. Renaye Rakers contacted and order for pain medication received. Patient notified Remus Loffler would not be ordered due to recent overdose of ativan patient understanding and accepting of Dr. Jarrett Ables

## 2022-11-17 NOTE — ED Notes (Addendum)
Pt called me to room to ask for his phone.  I advised pt that he is under IVC and he is not allowed to have his personal belongings.  Pt became irate and argumentative with me.  I told him that we can hold him for treatment until the Dr's can decide what they are doing next with his treatment.  He told me I would have to shoot him to make him go anywhere. I ask security to come and check on him and let him know our rules and process.

## 2022-11-17 NOTE — ED Notes (Signed)
Per poison control- observe pt for 6 hours or until pt is back to baseline. Main side effects are hypotension and respiratory depression. Treat symptoms. 4hr tylenol level. No activated charcoal needed.
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
## 2022-11-17 NOTE — ED Provider Notes (Signed)
Emergency Medicine Observation Re-evaluation Note  Alexander Harrington is a 32 y.o. male, seen on rounds today.  Pt initially presented to the ED for complaints of suicidal thoughts, possible ingestion. Currently awake and alert, no distress.   Physical Exam  BP (!) 128/94   Pulse 80   Resp 14   Ht 1.778 m (5\' 10" )   Wt 96.6 kg   SpO2 99%   BMI 30.56 kg/m  Physical Exam General: awake, alert.  Cardiac: regular rate.  Lungs: breathing comfortably. Psych: alert, content appearing.   ED Course / MDM    I have reviewed the labs performed to date as well as medications administered while in observation.  Recent changes in the last 24 hours include ED obs, reassessment.   Plan  Labs unremarkable, acetaminophen neg.   Pt fully awake and alert. No resp depression.  Pt appears medically stable/clear.   BH eval pending. Disposition per [facility] team.  The patient has been placed in psychiatric observation due to the need to provide a safe environment for the patient while obtaining psychiatric consultation and evaluation, as well as ongoing medical and medication management to treat the patient's condition.      Cathren Laine, MD 11/17/22 [phone]

## 2022-11-18 DIAGNOSIS — T1491XA Suicide attempt, initial encounter: Secondary | ICD-10-CM | POA: Diagnosis not present

## 2022-11-18 LAB — SARS CORONAVIRUS 2 BY RT PCR: SARS Coronavirus 2 by RT PCR: NEGATIVE

## 2022-11-18 MED ORDER — HYDROXYZINE HCL 25 MG PO TABS
25.0000 mg | ORAL_TABLET | Freq: Three times a day (TID) | ORAL | Status: DC | PRN
  Administered 2022-11-18 – 2022-11-19 (×3): 25 mg via ORAL
  Filled 2022-11-18 (×3): qty 1

## 2022-11-18 NOTE — ED Provider Notes (Signed)
Emergency Medicine Observation Re-evaluation Note  Ammon Muscatello is a 32 y.o. male, seen on rounds today.  Pt initially presented to the ED for complaints of Suicide Attempt Currently, the patient is sleeping.  Physical Exam  BP 124/87 (BP Location: Right Arm)   Pulse 63   Temp 97.7 F (36.5 C) (Oral)   Resp 20   Ht 5\' 10"  (1.778 m)   Wt 96.6 kg   SpO2 98%   BMI 30.56 kg/m  Physical Exam General: Sleeping Cardiac: Extremities perfused Lungs: Breathing is unlabored Psych: Deferred  ED Course / MDM  EKG:EKG Interpretation  Date/Time:  Saturday November 17 2022 06:46:53 EDT Ventricular Rate:  84 PR Interval:  142 QRS Duration: 94 QT Interval:  337 QTC Calculation: 399 R Axis:   71 Text Interpretation: Sinus rhythm Confirmed by Drema Pry [phone]) on 11/17/2022 6:56:25 AM  I have reviewed the labs performed to date as well as medications administered while in observation.  Recent changes in the last 24 hours include TTS evaluation yesterday.  Patient needs criteria for inpatient psychiatric hospitalization.  Plan  Current plan is for inpatient psychiatric admission.    Gloris Manchester, MD 11/18/22 [phone]

## 2022-11-18 NOTE — ED Notes (Signed)
Received call from [facility] accepting pt.  Accepting provider -Franchot Gallo Bed will be assigned upon arrival. Pt can arrive anytime after 7a tomorrow 11/19/22 Number to call report [phone]

## 2022-11-18 NOTE — ED Notes (Addendum)
Patient called into room by patient to answer questions. Patient was informed he has a bed tomorrow morning [facility]. Patient upset with this information. Patient states he was told by psych that he could possibly go home tomorrow and patient's family confirms that. Patient and family requesting to talk to NP or PA prior to being transferred. Informed patient and family that we are unable to do anything until the AM. Patient and family verbalized understanding.

## 2022-11-18 NOTE — Progress Notes (Signed)
[facility] Psych ED Progress Note  11/18/2022 11:04 AM Alexander Harrington  MRN:  [id number]   Subjective:  Alexander Harrington is a 32 y.o. male patient admitted with previous hx of Bipolar disorder, depression, ADHD, Anxiety and OCD brought in by EMS for attempted suicide by taking 200 mg Ativan.  His information is conflicting as a different documentation states he too 15 tablets of Ambien 10 mg.  Patient had Tonsillectomy surgery last week and states that pain and frustration associated with the Surgery made him more depressed.   This morning patient was seen by provider with father present in the room.  He is is calmer, engaged in meaningful conversation.  He corrected the note that states he OD on 200 MG Ativan.  Today he and father states that he OD on 15 tablets of Ambien 10 mg a piece.  Patient continues to ask for discharge back to his home  stating he has a job to start and that he recently completed six weeks of PHP at [facility].  Patient is interested in engaging in PHP at [facility] or at [facility] in [place]. Plan of care was reviewed with DR Lucianne Muss, Psychiatrist who agrees that patient cannot be discharged on a weekend without appointment and safety measures in place.  Plan is to seek bed placement today and if none is available we will plan for discharge with arrangement for PHP and better Medication management where only one week supply of medications will be offered at a time.  Both patient and father are in agreement.  Patient denies SI/HI/AVH and no mention of Paranoia. Principal Problem: Suicide attempt [facility]) Diagnosis:  Principal Problem:   Suicide attempt [facility]) Active Problems:   Bipolar I disorder, most recent episode depressed (HCC)   ED Assessment Time Calculation: Start Time: 1047 Stop Time: 1104 Total Time in Minutes (Assessment Completion): 17   Past Psychiatric History: see initial Psychiatry evaluation note  [place] Scale:  Flowsheet Row ED from 11/17/2022 in [facility]  Emergency Department at [facility] Admission (Discharged) from 11/07/2022 in [facility] 6 NORTH  SURGICAL Admission (Discharged) from 08/28/2022 in [facility]  C-SSRS RISK CATEGORY High Risk Low Risk No Risk       Past Medical History:  Past Medical History:  Diagnosis Date   ADHD    Anxiety    Dr. Evelene Croon   Bipolar 2 disorder (HCC)    Depression    HLD (hyperlipidemia)    Insomnia    Sleep apnea    do not use CPAP    Past Surgical History:  Procedure Laterality Date   DRUG INDUCED ENDOSCOPY N/A 08/28/2022   Procedure: DRUG INDUCED ENDOSCOPY;  Surgeon: Christia Reading, MD;  Location: [facility];  Service: ENT;  Laterality: N/A;   OTHER SURGICAL HISTORY     sedated ect treatment   TONSILLECTOMY Bilateral 11/07/2022   Procedure: TONSILLECTOMY;  Surgeon: Christia Reading, MD;  Location[facility] OR;  Service: ENT;  Laterality: Bilateral;   Family History:  Family History  Problem Relation Age of Onset   Arthritis Mother    Mental illness Mother    Depression Mother    Anxiety disorder Mother    Drug abuse Maternal Grandfather    Alcohol abuse Maternal Grandfather    Drug abuse Maternal Grandmother    Alcohol abuse Maternal Grandmother    Family Psychiatric  History: see initial Psychiatry evaluation note Social History:  Social History   Substance and Sexual Activity  Alcohol Use Yes  Alcohol/week: 2.0 - 4.0 standard drinks of alcohol   Types: 2 - 4 Cans of beer per week   Comment: weekly     Social History   Substance and Sexual Activity  Drug Use Yes   Frequency: 1.0 times per week   Types: Marijuana   Comment: Last use 11/05/22    Social History   Socioeconomic History   Marital status: Single    Spouse name: Not on file   Number of children: Not on file   Years of education: Not on file   Highest education level: Not on file  Occupational History   Occupation: student  Tobacco Use   Smoking status: Never   Smokeless  tobacco: Never   Tobacco comments:    Patient does vape occasionally.  No nicotine  Vaping Use   Vaping Use: Never used  Substance and Sexual Activity   Alcohol use: Yes    Alcohol/week: 2.0 - 4.0 standard drinks of alcohol    Types: 2 - 4 Cans of beer per week    Comment: weekly   Drug use: Yes    Frequency: 1.0 times per week    Types: Marijuana    Comment: Last use 11/05/22   Sexual activity: Not Currently    Birth control/protection: Condom  Other Topics Concern   Not on file  Social History Narrative   Caffienated drinks-no   Seat belt use often-yes   Regular Exercise-no   Smoke alarm in the home-yes   Firearms/guns in the home-no   History of physical abuse-no      04/08/2013 Alexander Harrington Alexander Harrington was born in [place], [place], and grew up in [place], [place]. 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 He lives off campus with roommates. He denies any legal problems. He reports that he is spiritual but acting not stick. His hobbies include watching movies, reading, and writing. His social support system consists of his father, mother, friends, and cousins. 04/08/2013 Alexander Harrington            Social Determinants of Health   Financial Resource Strain: Low Risk  (07/26/2017)   Overall Financial Resource Strain (CARDIA)    Difficulty of Paying Living Expenses: Not hard at all  Food Insecurity: No Food Insecurity (09/17/2022)   Hunger Vital Sign    Worried About Running Out of Food in the Last Year: Never true    Ran Out of Food in the Last Year: Never true  Transportation Needs: No Transportation Needs (09/17/2022)   PRAPARE - Administrator, Civil Service (Medical): No    Lack of Transportation (Non-Medical): No  Physical Activity: Unknown (07/26/2017)   Exercise Vital Sign    Days of Exercise per Week: 0 days    Minutes of Exercise per Session: Not on file   Stress: Stress Concern Present (07/26/2017)   [employer] of Occupational Health - Occupational Stress Questionnaire    Feeling of Stress : Very much  Social Connections: Moderately Isolated (07/26/2017)   Social Connection and Isolation Panel [NHANES]    Frequency of Communication with Friends and Family: More than three times a week    Frequency of Social Gatherings with Friends and Family: Twice a week    Attends Religious Services: Never    Database administrator or Organizations: No    Attends Banker Meetings: Never  Marital Status: Never married    Sleep: Good  Appetite:  Good  Current Medications: Current Facility-Administered Medications  Medication Dose Route Frequency Provider Last Rate Last Admin   clonazePAM (KLONOPIN) tablet 0.5 mg  0.5 mg Oral BID Adhya Cocco C, NP   0.5 mg at 11/18/22 0936   HYDROcodone-acetaminophen (NORCO) 7.5-325 MG per tablet 1 tablet  1 tablet Oral Q8H PRN Terald Sleeper, MD   1 tablet at 11/18/22 0519   lithium carbonate (ESKALITH) ER tablet 900 mg  900 mg Oral QHS Dahlia Byes C, NP   900 mg at 11/17/22 2216   Current Outpatient Medications  Medication Sig Dispense Refill   ALEVE 220 MG CAPS Take 220-440 mg by mouth 2 (two) times daily as needed (for pain or headaches).     clomiPRAMINE (ANAFRANIL) 25 MG capsule Take 25 mg by mouth See admin instructions. Beginning on 11/07/2022, take 25 mg by mouth at bedtime for 7 nights, then stop     clonazePAM (KLONOPIN) 1 MG tablet Take 1 mg by mouth 3 (three) times daily as needed for anxiety.     HYDROcodone-acetaminophen (HYCET) 7.5-325 mg/15 ml solution Take 15 mLs by mouth every 4 (four) hours as needed for moderate pain. 473 mL 0   lithium carbonate (LITHOBID) 300 MG CR tablet Take 900 mg by mouth at bedtime.     propranolol (INDERAL) 10 MG tablet Take 1 tablet (10 mg total) by mouth 2 (two) times daily. 180 tablet 1   rosuvastatin (CRESTOR) 5 MG tablet TAKE 1  TABLET(5 MG) BY MOUTH DAILY (Patient taking differently: Take 5 mg by mouth at bedtime.) 90 tablet 3   VYVANSE 10 MG capsule Take 10 mg by mouth daily.     zolpidem (AMBIEN) 10 MG tablet TAKE 1 TABLET(10 MG) BY MOUTH AT BEDTIME AS NEEDED FOR SLEEP (Patient taking differently: Take 10 mg by mouth at bedtime.) 30 tablet 3    Lab Results:  Results for orders placed or performed during the hospital encounter of 11/17/22 (from the past 48 hour(s))  Comprehensive metabolic panel     Status: Abnormal   Collection Time: 11/17/22  6:15 AM  Result Value Ref Range   Sodium 137 135 - 145 mmol/L   Potassium 3.5 3.5 - 5.1 mmol/L   Chloride 106 98 - 111 mmol/L   CO2 21 (L) 22 - 32 mmol/L   Glucose, Bld 89 70 - 99 mg/dL    Comment: Glucose reference range applies only to samples taken after fasting for at least 8 hours.   BUN 9 6 - 20 mg/dL   Creatinine, Ser 1.61 0.61 - 1.24 mg/dL   Calcium 9.1 8.9 - 09.6 mg/dL   Total Protein 7.1 6.5 - 8.1 g/dL   Albumin 3.7 3.5 - 5.0 g/dL   AST 28 15 - 41 U/L   ALT 35 0 - 44 U/L   Alkaline Phosphatase 44 38 - 126 U/L   Total Bilirubin 0.3 0.3 - 1.2 mg/dL   GFR, Estimated >04 >54 mL/min    Comment: (NOTE) Calculated using the CKD-EPI Creatinine Equation (2021)    Anion gap 10 5 - 15    Comment: Performed at [facility], [address], [place], [place] [address]  Salicylate level     Status: Abnormal   Collection Time: 11/17/22  6:15 AM  Result Value Ref Range   Salicylate Lvl <7.0 (L) 7.0 - 30.0 mg/dL    Comment: Performed at [facility],  [address] [place], [place] [address]  Acetaminophen level     Status: Abnormal   Collection Time: 11/17/22  6:15 AM  Result Value Ref Range   Acetaminophen (Tylenol), Serum <10 (L) 10 - 30 ug/mL    Comment: (NOTE) Therapeutic concentrations vary significantly. A range of 10-30 ug/mL  may be an effective concentration for many patients. However, some  are best treated at  concentrations outside of this range. Acetaminophen concentrations >150 ug/mL at 4 hours after ingestion  and >50 ug/mL at 12 hours after ingestion are often associated with  toxic reactions.  Performed at [facility], [address], [place], [place] [address]   Ethanol     Status: None   Collection Time: 11/17/22  6:15 AM  Result Value Ref Range   Alcohol, Ethyl (B) <10 <10 mg/dL    Comment: (NOTE) Lowest detectable limit for serum alcohol is 10 mg/dL.  For medical purposes only. Performed at [facility], [address] [place], [place] [address]   CBC WITH DIFFERENTIAL     Status: Abnormal   Collection Time: 11/17/22  6:15 AM  Result Value Ref Range   WBC 15.9 (H) 4.0 - 10.5 K/uL   RBC 5.18 4.22 - 5.81 MIL/uL   Hemoglobin 14.2 13.0 - 17.0 g/dL   HCT 56.4 33.2 - 95.1 %   MCV 84.6 80.0 - 100.0 fL   MCH 27.4 26.0 - 34.0 pg   MCHC 32.4 30.0 - 36.0 g/dL   RDW 88.4 16.6 - 06.3 %   Platelets 432 (H) 150 - 400 K/uL   nRBC 0.0 0.0 - 0.2 %   Neutrophils Relative % 58 %   Neutro Abs 9.3 (H) 1.7 - 7.7 K/uL   Lymphocytes Relative 29 %   Lymphs Abs 4.6 (H) 0.7 - 4.0 K/uL   Monocytes Relative 8 %   Monocytes Absolute 1.3 (H) 0.1 - 1.0 K/uL   Eosinophils Relative 3 %   Eosinophils Absolute 0.5 0.0 - 0.5 K/uL   Basophils Relative 1 %   Basophils Absolute 0.1 0.0 - 0.1 K/uL   Immature Granulocytes 1 %   Abs Immature Granulocytes 0.11 (H) 0.00 - 0.07 K/uL    Comment: Performed at [facility], [address] [place], [place] [address]  CBG monitoring, ED     Status: None   Collection Time: 11/17/22  6:31 AM  Result Value Ref Range   Glucose-Capillary 98 70 - 99 mg/dL    Comment: Glucose reference range applies only to samples taken after fasting for at least 8 hours.  Urine rapid drug screen (hosp performed)     Status: Abnormal   Collection Time: 11/17/22  8:14 AM  Result Value Ref Range   Opiates POSITIVE (A) NONE  DETECTED   Cocaine NONE DETECTED NONE DETECTED   Benzodiazepines POSITIVE (A) NONE DETECTED   Amphetamines NONE DETECTED NONE DETECTED   Tetrahydrocannabinol POSITIVE (A) NONE DETECTED   Barbiturates NONE DETECTED NONE DETECTED    Comment: (NOTE) DRUG SCREEN FOR MEDICAL PURPOSES ONLY.  IF CONFIRMATION IS NEEDED FOR ANY PURPOSE, NOTIFY LAB WITHIN 5 DAYS.  LOWEST DETECTABLE LIMITS FOR URINE DRUG SCREEN Drug Class                     Cutoff (ng/mL) Amphetamine and metabolites    1000 Barbiturate and metabolites    200 Benzodiazepine  200 Opiates and metabolites        300 Cocaine and metabolites        300 THC                            50 Performed at [facility], [address] [place], [place] [address]   Lithium level     Status: Abnormal   Collection Time: 11/17/22  9:08 AM  Result Value Ref Range   Lithium Lvl 0.38 (L) 0.60 - 1.20 mmol/L    Comment: Performed at [facility], [address] [place], [place] [address]  Acetaminophen level     Status: Abnormal   Collection Time: 11/17/22  9:30 AM  Result Value Ref Range   Acetaminophen (Tylenol), Serum <10 (L) 10 - 30 ug/mL    Comment: (NOTE) Therapeutic concentrations vary significantly. A range of 10-30 ug/mL  may be an effective concentration for many patients. However, some  are best treated at concentrations outside of this range. Acetaminophen concentrations >150 ug/mL at 4 hours after ingestion  and >50 ug/mL at 12 hours after ingestion are often associated with  toxic reactions.  Performed at [facility], [address], [place], [place] [address]     Blood Alcohol level:  Lab Results  Component Value Date   [facility] <10 11/17/2022   ETH <10 06/21/2022    Physical Findings:  CIWA:    COWS:     Musculoskeletal: Strength & Muscle Tone: within normal limits Gait & Station: normal Patient leans: Front  Psychiatric Specialty  Exam:  Presentation  General Appearance:  Casual; Neat  Eye Contact: Fleeting  Speech: Clear and Coherent; Normal Rate  Speech Volume: Normal  Handedness: Right   Mood and Affect  Mood: Depressed  Affect: Congruent; Depressed; Tearful   Thought Process  Thought Processes: Coherent; Goal Directed; Linear  Descriptions of Associations:Intact  Orientation:Full (Time, Place and Person)  Thought Content:Logical  History of Schizophrenia/Schizoaffective disorder:No data recorded Duration of Psychotic Symptoms:No data recorded Hallucinations:Hallucinations: None  Ideas of Reference:None  Suicidal Thoughts:Suicidal Thoughts: No  Homicidal Thoughts:Homicidal Thoughts: No   Sensorium  Memory: Immediate Good; Recent Good; Remote Good  Judgment: Fair  Insight: Good   Executive Functions  Concentration: Good  Attention Span: Good  Recall: Good  Fund of Knowledge: Fair  Language: Good   Psychomotor Activity  Psychomotor Activity: Psychomotor Activity: Normal   Assets  Assets: Communication Skills; Desire for Improvement; Financial Resources/Insurance; Housing; Physical Health   Sleep  Sleep: Sleep: Fair    Physical Exam: Physical Exam Vitals and nursing note reviewed.  Constitutional:      Appearance: Normal appearance.  HENT:     Head: Normocephalic and atraumatic.     Nose: Nose normal.  Cardiovascular:     Rate and Rhythm: Normal rate and regular rhythm.  Pulmonary:     Effort: Pulmonary effort is normal.  Musculoskeletal:        General: Normal range of motion.     Cervical back: Normal range of motion.  Skin:    General: Skin is warm and dry.  Neurological:     General: No focal deficit present.     Mental Status: He is alert and oriented to person, place, and time.  Psychiatric:        Attention and Perception: Attention and perception normal.        Mood and Affect: Mood is anxious.  Speech: Speech  normal.        Behavior: Behavior normal. Behavior is cooperative.        Thought Content: Thought content normal.        Cognition and Memory: Cognition and memory normal.    Review of Systems  Constitutional: Negative.   HENT: Negative.    Eyes: Negative.   Respiratory: Negative.    Cardiovascular: Negative.   Gastrointestinal: Negative.   Genitourinary: Negative.   Musculoskeletal: Negative.   Skin: Negative.   Neurological: Negative.   Endo/Heme/Allergies: Negative.   Psychiatric/Behavioral:  Positive for depression. The patient is nervous/anxious.    Blood pressure 124/87, pulse 63, temperature 97.7 F (36.5 C), temperature source Oral, resp. rate 20, height 5\' 10"  (1.778 m), weight 96.6 kg, SpO2 98 %. Body mass index is 30.56 kg/m.   Medical Decision Making: We will continue to seek bed placement.  If none became's available by tomorrow patient will be discharged with PHP in place with Medication safety plan put in place. Seek inpatient Psychiatry hospitalization, fax out records for bed placement.  Continue current Medication management.  Earney Navy, NP-PMHNP-BC 11/18/2022, 11:04 AM

## 2022-11-18 NOTE — ED Notes (Signed)
Patient has been alert and cooperative this shift. Medication compliant. Patient has been quiet.,  Patient had some anxiety noted. Patient denied suicidal ideation. Patient denied homicidal ideation.

## 2022-11-18 NOTE — Progress Notes (Signed)
LCSW Progress Note  [id number]   Mujtaba Bollig  11/18/2022  5:13 PM    Inpatient Behavioral Health Placement  Pt meets inpatient criteria per Earney Navy, NP-PMHNP . There are no available beds within [facility] system per [facility] Antoinette Cillo, RN. Referral was sent to the following facilities;    Destination  Service Provider Address Phone Fax  [facility]  [address], [place] [address] [phone]  [facility]  [address] [place], [place] [address] [phone]  [facility]  [address] [place] [address] [phone]  [facility]  [address] [place], [place] [address] [phone]  [facility]  [address] [place] [address] [phone]  [facility]  [address] [place], [place] [address] [phone]  [facility]  [address] [place] [address] [phone]  [facility]  [address] [place] [address] [phone]  [facility]  [address] [place] [address] [phone]  [facility]  [address] [place], [place] [address] [phone]  [facility]  [address] [place] [address] [phone]  [facility]  [address], [place] [address] [phone]  [facility]  [address] [place] [address] [phone]  [facility]  [address], [place] [address] [phone]  [facility]  [address] [place] [address] [phone]  [facility]  [address] [place] [address] [phone]  [facility]  [address], [place] [address] [phone]  [facility]  [address] [place] [address] [phone]  [facility]  [address], [place] [address] [phone]  [facility]  [address] [place] [address] [phone]  [facility]  [address] [place] [address] [phone]  [facility]  [address] [place] [address] [phone]  [facility]  [address], [place] [address] [phone]  [facility]  [address] [place] [address] [phone]  [facility]  [address] [place] [address] [phone]  [facility] [address]  [address], [place] [address] [phone]  [facility]  [address] [place] [address] [phone]   Situation ongoing,  CSW will follow up.    Maryjean Ka, MSW, [facility] 11/18/2022 5:13 PM

## 2022-11-19 DIAGNOSIS — T1491XA Suicide attempt, initial encounter: Secondary | ICD-10-CM

## 2022-11-19 MED ORDER — ZIPRASIDONE MESYLATE 20 MG IM SOLR
20.0000 mg | Freq: Once | INTRAMUSCULAR | Status: AC
  Administered 2022-11-19: 20 mg via INTRAMUSCULAR
  Filled 2022-11-19: qty 20

## 2022-11-19 MED ORDER — HALOPERIDOL 5 MG PO TABS
5.0000 mg | ORAL_TABLET | Freq: Four times a day (QID) | ORAL | Status: DC | PRN
  Administered 2022-11-19: 5 mg via ORAL
  Filled 2022-11-19: qty 1

## 2022-11-19 NOTE — ED Notes (Signed)
Transport initiated, LEO/SD contacted, pending arrival.

## 2022-11-19 NOTE — ED Provider Notes (Signed)
Emergency Medicine Observation Re-evaluation Note  Alexander Harrington is a 32 y.o. male, seen on rounds today.  Pt initially presented to the ED for complaints of Suicide Attempt Currently, the patient is awaiting placement.  Physical Exam  BP 112/61 (BP Location: Left Arm)   Pulse 68   Temp 97.6 F (36.4 C) (Oral)   Resp 18   Ht 5\' 10"  (1.778 m)   Wt 96.6 kg   SpO2 98%   BMI 30.56 kg/m  Physical Exam General: NAD Cardiac: RR Lungs: even unlabored Psych: Denies SI, no hallucinations  ED Course / MDM  EKG:EKG Interpretation  Date/Time:  Saturday November 17 2022 06:46:53 EDT Ventricular Rate:  84 PR Interval:  142 QRS Duration: 94 QT Interval:  337 QTC Calculation: 399 R Axis:   71 Text Interpretation: Sinus rhythm Confirmed by Drema Pry [phone]) on 11/17/2022 6:56:25 AM  I have reviewed the labs performed to date as well as medications administered while in observation.  Recent changes in the last 24 hours include agitated last night regarding discussion of being sent to inpatinet treatment.  Plan  Current plan is for inpatient care, he has been [facility] by Dr. Arlana Pouch.    After psychiatry reevaluated today and discussed with patient and family have determined he is appropriate for outpatient therapy, partial hospitalization.  Patient reports his symptoms worsen in inpt care. He has no current SI and is motivated to continue partial hospitalization for treatment. He contracts for safety. IVC rescinded per psychiatry recommendations.  Patient discharged in stable condition with understanding of reasons to return.     Alexander Monday, MD 11/21/22 1104

## 2022-11-19 NOTE — ED Notes (Signed)
Pt. Screaming saying he is scared, and has been lied too. If he go to inpatient he will die. Pt is crying and screaming hysterically.

## 2022-11-19 NOTE — ED Notes (Signed)
Awakened pt by verbal stimuli to eat breakfast. Endorses throat pain r/t tonsillectomy 1 week ago, also endorses anxiety. Sitter at [employer]. [employer] security present.

## 2022-11-19 NOTE — ED Notes (Signed)
Morning and PRN meds given for pain and anxiety. Pt reiterates "not going, I will fight, need to talk to my dad and BH, need to be discharged home today". Therapeutic listening. Encouraged eating. Informed pt that I hear him, and will notify [facility] and will contact father. Becoming agitated and cursing (not directed or threatening). Remains cooperative and redirectable.

## 2022-11-19 NOTE — TOC CM/SW Note (Signed)
CM noted TOc consult, referral made to [facility], intake coordinator reports that the agency will need to outreach to patient directly to complete an intake interview and determine appropriateness for their program.  Intake coordinator reports they have located patient's information from previous involvement with the program and will reach out to him directly.

## 2022-11-19 NOTE — ED Notes (Signed)
Psych NP in to see. 

## 2022-11-19 NOTE — ED Notes (Signed)
Pt out to phone to call father

## 2022-11-19 NOTE — ED Notes (Signed)
Inpt psych cancelled per BH/psych, pending d/c.

## 2022-11-19 NOTE — ED Notes (Addendum)
Belongings (clothes and phone) returned to pt

## 2022-11-19 NOTE — ED Notes (Addendum)
EDP rounding. Sitter, security and [employer] present. Breakfasts delivered. Remains sleeping.  Per yesterday note: Received call from [facility] accepting pt.  Accepting provider -Franchot Gallo Bed will be assigned upon arrival. Pt can arrive anytime after 7a tomorrow 11/19/22 Number to call report [phone]

## 2022-11-19 NOTE — ED Notes (Signed)
MPHNP spoke with father via phone. RN present. Will continue discussed plan. Plan, process, and timeframe re-explained with rationale. Report being called to [facility].

## 2022-11-19 NOTE — ED Notes (Signed)
Patient screaming and crying saying he is scared. He states wants to die if he has to go to the inpatient. Patient reports he will fight staff and security if he has to go. MD made aware. Geodon ordered.

## 2022-11-19 NOTE — Discharge Summary (Cosign Needed Addendum)
[facility] Psych ED Discharge  11/19/2022 9:38 AM Alexander Harrington  MRN:  [id number]  Principal Problem: Suicide attempt [facility]) Discharge Diagnoses: Principal Problem:   Suicide attempt [facility]) Active Problems:   Bipolar I disorder, most recent episode depressed (HCC)  Clinical Impression:  Final diagnoses:  Suicide attempt [facility])   Subjective: Alexander Harrington is a 32 y.o. male patient admitted with previous hx of Bipolar disorder, depression, ADHD, Anxiety and OCD brought in by EMS for attempted suicide by taking 200 mg Ativan.  His information is conflicting as a different documentation states he too 15 tablets of Ambien 10 mg.  Patient had Tonsillectomy surgery last week and states that pain and frustration associated with the Surgery made him more depressed.  Patient received admission bed at [facility] for inpatient Mental health hospitalization.  Patient and father believes he no longer need inpatient admission but instead PHP Which he want to start at [facility] in [place].  He recently graduated from that program about six weeks ago.  Patient's father states son is going home to his apartment but his cousin is going to stay with him.  Patient vehemently denies SI/HI/AVH and he has committed to engaging in [facility] as soon as he leaves the ER.Marland Kitchen Patient has been compliant with Medications and eats well.  We discussed safety plan-call [phone] or [phone] for mental health crisis.  Patient is also advised to go to the nearest ER or [place]  mental health facility for Mental health Crisis.  Patient is also advised to follow up with his outpatient Psychiatrist as well.  DR Lucianne Muss, Psychiatrist is in agreement with discharge plan.  She agrees that patient will benefit from [facility] since he just completed the program.  Patient is Psychiatrically cleared.  ED Assessment Time Calculation: Start Time: 0922 Stop Time: 0938 Total Time in Minutes (Assessment Completion): 16   Past Psychiatric History: see initial  psychiatry evaluation note  Past Medical History:  Past Medical History:  Diagnosis Date   ADHD    Anxiety    Dr. Evelene Croon   Bipolar 2 disorder (HCC)    Depression    HLD (hyperlipidemia)    Insomnia    Sleep apnea    do not use CPAP    Past Surgical History:  Procedure Laterality Date   DRUG INDUCED ENDOSCOPY N/A 08/28/2022   Procedure: DRUG INDUCED ENDOSCOPY;  Surgeon: Christia Reading, MD;  Location: [facility];  Service: ENT;  Laterality: N/A;   OTHER SURGICAL HISTORY     sedated ect treatment   TONSILLECTOMY Bilateral 11/07/2022   Procedure: TONSILLECTOMY;  Surgeon: Christia Reading, MD;  Location: [facility] OR;  Service: ENT;  Laterality: Bilateral;   Family History:  Family History  Problem Relation Age of Onset   Arthritis Mother    Mental illness Mother    Depression Mother    Anxiety disorder Mother    Drug abuse Maternal Grandfather    Alcohol abuse Maternal Grandfather    Drug abuse Maternal Grandmother    Alcohol abuse Maternal Grandmother    Family Psychiatric  History: see initial psychiatry evaluation note. Social History:  Social History   Substance and Sexual Activity  Alcohol Use Yes   Alcohol/week: 2.0 - 4.0 standard drinks of alcohol   Types: 2 - 4 Cans of beer per week   Comment: weekly     Social History   Substance and Sexual Activity  Drug Use Yes   Frequency: 1.0 times per week   Types: Marijuana  Comment: Last use 11/05/22    Social History   Socioeconomic History   Marital status: Single    Spouse name: Not on file   Number of children: Not on file   Years of education: Not on file   Highest education level: Not on file  Occupational History   Occupation: student  Tobacco Use   Smoking status: Never   Smokeless tobacco: Never   Tobacco comments:    Patient does vape occasionally.  No nicotine  Vaping Use   Vaping Use: Never used  Substance and Sexual Activity   Alcohol use: Yes    Alcohol/week: 2.0 - 4.0 standard drinks  of alcohol    Types: 2 - 4 Cans of beer per week    Comment: weekly   Drug use: Yes    Frequency: 1.0 times per week    Types: Marijuana    Comment: Last use 11/05/22   Sexual activity: Not Currently    Birth control/protection: Condom  Other Topics Concern   Not on file  Social History Narrative   Caffienated drinks-no   Seat belt use often-yes   Regular Exercise-no   Smoke alarm in the home-yes   Firearms/guns in the home-no   History of physical abuse-no      04/08/2013 AHW [name] was born in [place], [place], and grew up in [place], [place]. 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 He lives off campus with roommates. He denies any legal problems. He reports that he is spiritual but acting not stick. His hobbies include watching movies, reading, and writing. His social support system consists of his father, mother, friends, and cousins. 04/08/2013 AHW            Social Determinants of Health   Financial Resource Strain: Low Risk  (07/26/2017)   Overall Financial Resource Strain (CARDIA)    Difficulty of Paying Living Expenses: Not hard at all  Food Insecurity: No Food Insecurity (09/17/2022)   Hunger Vital Sign    Worried About Running Out of Food in the Last Year: Never true    Ran Out of Food in the Last Year: Never true  Transportation Needs: No Transportation Needs (09/17/2022)   PRAPARE - Administrator, Civil Service (Medical): No    Lack of Transportation (Non-Medical): No  Physical Activity: Unknown (07/26/2017)   Exercise Vital Sign    Days of Exercise per Week: 0 days    Minutes of Exercise per Session: Not on file  Stress: Stress Concern Present (07/26/2017)   [employer] of Occupational Health - Occupational Stress Questionnaire    Feeling of Stress : Very much  Social Connections: Moderately Isolated (07/26/2017)   Social  Connection and Isolation Panel [NHANES]    Frequency of Communication with Friends and Family: More than three times a week    Frequency of Social Gatherings with Friends and Family: Twice a week    Attends Religious Services: Never    Database administrator or Organizations: No    Attends Engineer, structural: Never    Marital Status: Never married    Tobacco Cessation:  N/A, patient does not currently use tobacco products  Current Medications: Current Facility-Administered Medications  Medication Dose Route Frequency Provider Last Rate Last Admin   clonazePAM (KLONOPIN) tablet 0.5 mg  0.5 mg Oral BID Earney Navy, NP  0.5 mg at 11/19/22 0901   haloperidol (HALDOL) tablet 5 mg  5 mg Oral Q6H PRN Dione Booze, MD   5 mg at 11/19/22 0111   HYDROcodone-acetaminophen (NORCO) 7.5-325 MG per tablet 1 tablet  1 tablet Oral Q8H PRN Terald Sleeper, MD   1 tablet at 11/19/22 0901   hydrOXYzine (ATARAX) tablet 25 mg  25 mg Oral TID PRN Dahlia Byes C, NP   25 mg at 11/19/22 0901   lithium carbonate (ESKALITH) ER tablet 900 mg  900 mg Oral QHS Dahlia Byes C, NP   900 mg at 11/18/22 2105   Current Outpatient Medications  Medication Sig Dispense Refill   ALEVE 220 MG CAPS Take 220-440 mg by mouth 2 (two) times daily as needed (for pain or headaches).     clomiPRAMINE (ANAFRANIL) 25 MG capsule Take 25 mg by mouth See admin instructions. Beginning on 11/07/2022, take 25 mg by mouth at bedtime for 7 nights, then stop     clonazePAM (KLONOPIN) 1 MG tablet Take 1 mg by mouth 3 (three) times daily as needed for anxiety.     HYDROcodone-acetaminophen (HYCET) 7.5-325 mg/15 ml solution Take 15 mLs by mouth every 4 (four) hours as needed for moderate pain. 473 mL 0   lithium carbonate (LITHOBID) 300 MG CR tablet Take 900 mg by mouth at bedtime.     propranolol (INDERAL) 10 MG tablet Take 1 tablet (10 mg total) by mouth 2 (two) times daily. 180 tablet 1   rosuvastatin (CRESTOR) 5  MG tablet TAKE 1 TABLET(5 MG) BY MOUTH DAILY (Patient taking differently: Take 5 mg by mouth at bedtime.) 90 tablet 3   VYVANSE 10 MG capsule Take 10 mg by mouth daily.     zolpidem (AMBIEN) 10 MG tablet TAKE 1 TABLET(10 MG) BY MOUTH AT BEDTIME AS NEEDED FOR SLEEP (Patient taking differently: Take 10 mg by mouth at bedtime.) 30 tablet 3   PTA Medications: (Not in a hospital admission)   [place] Scale:  Flowsheet Row ED from 11/17/2022 in [facility] Emergency Department at [facility] Admission (Discharged) from 11/07/2022 in [facility]  SURGICAL Admission (Discharged) from 08/28/2022 in [facility]  C-SSRS RISK CATEGORY High Risk Low Risk No Risk       Musculoskeletal: Strength & Muscle Tone: within normal limits Gait & Station: normal Patient leans: Front  Psychiatric Specialty Exam: Presentation  General Appearance:  Casual; Neat  Eye Contact: Good  Speech: Clear and Coherent; Normal Rate  Speech Volume: Normal  Handedness: Right   Mood and Affect  Mood: Anxious  Affect: Congruent   Thought Process  Thought Processes: Coherent; Goal Directed; Linear  Descriptions of Associations:Intact  Orientation:Full (Time, Place and Person)  Thought Content:Logical  History of Schizophrenia/Schizoaffective disorder:No data recorded Duration of Psychotic Symptoms:No data recorded Hallucinations:Hallucinations: None  Ideas of Reference:None  Suicidal Thoughts:Suicidal Thoughts: No  Homicidal Thoughts:Homicidal Thoughts: No   Sensorium  Memory: Immediate Good; Recent Good; Remote Good  Judgment: Good  Insight: Good   Executive Functions  Concentration: Good  Attention Span: Good  Recall: Good  Fund of Knowledge: Good  Language: Good   Psychomotor Activity  Psychomotor Activity: Psychomotor Activity: Normal   Assets  Assets: Communication Skills; Desire for Improvement; Housing; Financial  Resources/Insurance   Sleep  Sleep: Sleep: Good    Physical Exam: Physical Exam Vitals and nursing note reviewed.  Constitutional:      Appearance: Normal appearance.  HENT:     Head:  Normocephalic and atraumatic.     Nose: Nose normal.  Cardiovascular:     Rate and Rhythm: Normal rate and regular rhythm.  Pulmonary:     Effort: Pulmonary effort is normal.  Musculoskeletal:        General: Normal range of motion.     Cervical back: Normal range of motion.  Skin:    General: Skin is warm and dry.  Neurological:     General: No focal deficit present.     Mental Status: He is alert and oriented to person, place, and time.  Psychiatric:        Attention and Perception: Attention and perception normal.        Mood and Affect: Mood and affect normal.        Speech: Speech normal.        Behavior: Behavior normal.        Thought Content: Thought content normal.        Cognition and Memory: Cognition and memory normal.    Review of Systems  Constitutional: Negative.   HENT: Negative.    Eyes: Negative.   Respiratory: Negative.    Cardiovascular: Negative.   Gastrointestinal: Negative.   Genitourinary: Negative.   Musculoskeletal: Negative.   Skin: Negative.   Neurological: Negative.   Endo/Heme/Allergies: Negative.   Psychiatric/Behavioral:  The patient is nervous/anxious.    Blood pressure 112/61, pulse 68, temperature 97.6 F (36.4 C), temperature source Oral, resp. rate 18, height 5\' 10"  (1.778 m), weight 96.6 kg, SpO2 98 %. Body mass index is 30.56 kg/m.   Demographic Factors:  Male, Adolescent or young adult, Caucasian, Living alone, and Unemployed  Loss Factors: NA  Historical Factors: Prior suicide attempts and Impulsivity  Risk Reduction Factors:   Living with another person, especially a relative and Father reports that patient is going to be staying with his cousin  Continued Clinical Symptoms:  More than one psychiatric diagnosis Previous  Psychiatric Diagnoses and Treatments Medical Diagnoses and Treatments/Surgeries  Cognitive Features That Contribute To Risk:  Thought constriction (tunnel vision)    Suicide Risk:  Mild:  Suicidal ideation of limited frequency, intensity, duration, and specificity.  There are no identifiable plans, no associated intent, mild dysphoria and related symptoms, good self-control (both objective and subjective assessment), few other risk factors, and identifiable protective factors, including available and accessible social support.    Plan Of Care/Follow-up recommendations:  Activity:  as tolerated Diet:  Regular  Medical Decision Making: Patient is being discharged to start PHP today.  He and his father declined inpatient Psychiatry admission at [facility] stating patient is stable to go home and engage in outpatient Mental healthcare.  Patient denies SI/HI/AVH and we reviewed safety plan as well.  Patient is Psychiatrically cleared.   Disposition: Psychiatrically cleared  Earney Navy, NP-PMHNP-NP 11/19/2022, 9:38 AM

## 2022-11-19 NOTE — ED Notes (Signed)
EDP at [facility] 

## 2022-11-21 ENCOUNTER — Telehealth (HOSPITAL_COMMUNITY): Payer: Self-pay | Admitting: Licensed Clinical Social Worker

## 2022-11-21 ENCOUNTER — Other Ambulatory Visit (HOSPITAL_COMMUNITY): Payer: BC Managed Care – PPO

## 2022-11-21 DIAGNOSIS — F3181 Bipolar II disorder: Secondary | ICD-10-CM | POA: Diagnosis not present

## 2022-11-21 NOTE — Telephone Encounter (Signed)
See call intake 

## 2022-11-22 ENCOUNTER — Other Ambulatory Visit (HOSPITAL_COMMUNITY): Payer: BC Managed Care – PPO | Attending: Psychiatry | Admitting: Professional

## 2022-11-22 DIAGNOSIS — F3130 Bipolar disorder, current episode depressed, mild or moderate severity, unspecified: Secondary | ICD-10-CM

## 2022-11-26 ENCOUNTER — Encounter (HOSPITAL_COMMUNITY): Payer: Self-pay

## 2022-11-26 NOTE — Psych (Signed)
Virtual Visit via Video Note  I connected with Alexander Harrington on 11/22/22 at  1:00 PM EDT by a video enabled telemedicine application and verified that I am speaking with the correct person using two identifiers.  Location: Patient: Home Provider: Clinical Home Office   I discussed the limitations of evaluation and management by telemedicine and the availability of in person appointments. The patient expressed understanding and agreed to proceed.  Follow Up Instructions:    I discussed the assessment and treatment plan with the patient. The patient was provided an opportunity to ask questions and all were answered. The patient agreed with the plan and demonstrated an understanding of the instructions.   The patient was advised to call back or seek an in-person evaluation if the symptoms worsen or if the condition fails to improve as anticipated.  I provided 75 minutes of non-face-to-face time during this encounter.   Alexander Harrington, [facility]     Comprehensive Clinical Assessment (CCA) Note  11/22/2022 Alexander Harrington [id number]  Chief Complaint:  Chief Complaint  Patient presents with   Depression   Anxiety   Other    Follow up   Visit Diagnosis: Bipolar 1    CCA Screening, Triage and Referral (STR)  Patient Reported Information How did you hear about [place]? Hospital Discharge  Referral name: ED  Referral phone number: No data recorded  Whom do you see for routine medical problems? Primary Care  Practice/Facility Name: Dr. Mordecai Rasmussen in [facility]  Practice/Facility Phone Number: No data recorded Name of Contact: No data recorded Contact Number: No data recorded Contact Fax Number: No data recorded Prescriber Name: No data recorded Prescriber Address (if known): No data recorded  What Is the Reason for Your Visit/Call Today? f/u from ED due to suicide attempt; anxiety, depression, OCD, Bipolar 2; ADHD  How Long Has This Been Causing You Problems? > than 6  months  What Do You Feel Would Help You the Most Today? Treatment for Depression or other mood problem   Have You Recently Been in Any Inpatient Treatment (Hospital/Detox/Crisis Center/28-Day Program)? Yes  Name/Location of Program/Hospital:ED/ PHP at [facility] from Jan 2024-March 2024  How Long Were You There? a few days/ 2.5 months  When Were You Discharged? No data recorded  Have You Ever Received Services From [facility] Before? Yes  Who Do You See at [facility]? No data recorded  Have You Recently Had Any Thoughts About Hurting Yourself? Yes (attempt on 4/26)  Are You Planning to Commit Suicide/Harm Yourself At This time? No   Have you Recently Had Thoughts About Hurting Someone Alexander Harrington? No  Explanation: No data recorded  Have You Used Any Alcohol or Drugs in the Past 24 Hours? No  How Long Ago Did You Use Drugs or Alcohol? No data recorded What Did You Use and How Much? No data recorded  Do You Currently Have a Therapist/Psychiatrist? Yes  Name of Therapist/Psychiatrist: OCD specialist (ERP Therapist): Pollie Friar at [facility]- for 1-2 months; Trauma Therapist starting next week: Annice Pih "M-something" via telehealth Pscyhiatry: "over a decade" Dr. Janace Hoard- wants to switch to [facility] in [place]   Have You Been Recently Discharged From Any Office Practice or Programs? No data recorded Explanation of Discharge From Practice/Program: No data recorded    CCA Screening Triage Referral Assessment Type of Contact: Tele-Assessment  Is this Initial or Reassessment? Initial Assessment  Date Telepsych consult ordered in [facility]:  No data recorded Time Telepsych consult ordered in [facility]:  No data recorded  Patient Reported Information Reviewed? No data recorded Patient Left Without Being Seen? No data recorded Reason for Not Completing Assessment: No data recorded  Collateral Involvement: chart review   Does Patient Have a Court Appointed Legal Guardian? No data  recorded Name and Contact of Legal Guardian: No data recorded If Minor and Not Living with Parent(s), Who has Custody? No data recorded Is CPS involved or ever been involved? Never  Is APS involved or ever been involved? Never   Patient Determined To Be At Risk for Harm To Self or Others Based on Review of Patient Reported Information or Presenting Complaint? No data recorded Method: No Plan  Availability of Means: No data recorded Intent: No data recorded Notification Required: No data recorded Additional Information for Danger to Others Potential: No data recorded Additional Comments for Danger to Others Potential: No data recorded Are There Guns or Other Weapons in Your Home? Yes  Types of Guns/Weapons: Swords- collection- not sharp  Are These Geophysical data processor Secured?                            No data recorded Who Could Verify You Are Able To Have These Secured: No data recorded Do You Have any Outstanding Charges, Pending Court Dates, Parole/Probation? No data recorded Contacted To Inform of Risk of Harm To Self or Others: No data recorded  Location of Assessment: Other (comment)   Does Patient Present under Involuntary Commitment? No data recorded IVC Papers Initial File Date: No data recorded  [place] of Residence: [place]   Patient Currently Receiving the Following Services: No data recorded  Determination of Need: Urgent (48 hours)   Options For Referral: Partial Hospitalization     CCA Biopsychosocial Intake/Chief Complaint:  Alexander Harrington reports for a PHP CCA, r/b [facility]. He shares stressors include: 1) Mom: Alexander Harrington reports mother has stage 4 cancer and has been fighting for the last 73m. 2) Independence: Alexander Harrington reports he has tried to live independently "and that's really hard for me." 3) Complicated grief: Alexander Harrington reports grief and anger related to Mother. "I really don't like my mom. I'm upset this has happened, but not in a sad way. It's just very complicated." 4)  Supports: Alexander Harrington shares that he has lost friendships due to being "so depressed" since he has isolated himself more over the last 2 years. He reports "I'm very disconnected from people." 5) Employment: Alexander Harrington reports he has applied for disability but has not gotten an answer yet. He reports he is trying to figure out if he needs to change career paths. 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 6) Treatment: Alexander Harrington reports he had an "intense attachment" with another pt at [facility] in 2022. "I was like her caretaker. I was in constant crisis after meeting her." Alexander Harrington reports he thinks this started his problems. He reports this opened his eyes to trauma and other attachment issues. 7) Attention: Alexander Harrington reports he feels he has no support and his Dad gives his Mom too much attention. "I feel like I am fighting for attention. My mom is sick and my dad gives her more attention. I feel like I have to act out to get help." Alexander Harrington has an extensive treatment history, including hospitalization in 2013 [facility] this PHP in 2019, 5 weeks at [facility] residential program in 2022, residential stay at [facility] in 2023, [place] at [facility] (residential) from 07/2022-09/2022, Summer of 2023, and  07/2021-09/2021. He has had ECT, TMS, and currently Ketamine infusions since October 2023 at [facility] in [place]. He sees Dr. Janace Hoard for over a decade for psychiatry, ERP/OCD specialist therapist Ellan Lambert at [facility] counseling for the last 1-2 months, and is starting with Annice Pih "M-something" for trauma therapy next week. Attempts include 2012 via OD (was not hospitalized); 10/2022 OD- needed hospitalization; both impulsive. Endorses current SI, denies attempt/plan. Denies HI/AVH. Protective factors: "I don't want to go inpatient." Has history of self-harming, requiring stitches multiple times: last 06/2022 with ED visit- denies urges at this time. Family hx includes mom with depression, anxiety; "alcoholics on her  side." Lives alone, close to parents. Reports no supports. Medical Diagnosis: Sleep Apnea; High Cholesterol  Current Symptoms/Problems: recent suicide attempt via OD; ruminations; increased depression; increased anxiety; oversleeping to cope; perfectionist thinking; increased isolation; fatigue; lacks concentration; "derealization/depersoalization/dissociation" issues; anger; attention seeking behaviors; ADLs decreased (cooking; showering); mood swings; increased crying; feelings of hopelessness/worthlessness; panic attacks: last one last night- unable to identify how many a week because "I feel like that's my baseline now"; anhedonia   Patient Reported Schizophrenia/Schizoaffective Diagnosis in Past: No   Strengths: Supportive family  Preferences: to get better  Abilities: can attend and partipate in treatment   Type of Services Patient Feels are Needed: PHP   Initial Clinical Notes/Concerns: Alexander Harrington has participated in a lot of tx options at difference locations including TMS, ECT, IOP, Ketamine, multiple PHPs, inpatient, residential, long-term psychiatry, and a variety of ind. therapists. Alexander Harrington has had multiple suicide attempts. Alexander Harrington shares he is seeking the attention of his father.   Mental Health Symptoms Depression:   Change in energy/activity; Difficulty Concentrating; Fatigue; Hopelessness; Irritability; Sleep (too much or little); Worthlessness; Increase/decrease in appetite; Tearfulness   Duration of Depressive symptoms:  Greater than two weeks   Mania:   Irritability; Racing thoughts   Anxiety:    Difficulty concentrating; Fatigue; Irritability; Restlessness; Sleep; Worrying   Psychosis:   None   Duration of Psychotic symptoms: No data recorded  Trauma:   Detachment from others; Irritability/anger   Obsessions:   Cause anxiety; Disrupts routine/functioning; Poor insight   Compulsions:  No data recorded  Inattention:  No data recorded  Hyperactivity/Impulsivity:   No data recorded  Oppositional/Defiant Behaviors:   None   Emotional Irregularity:   Chronic feelings of emptiness; Potentially harmful impulsivity; Intense/unstable relationships; Mood lability; Recurrent suicidal behaviors/gestures/threats; Unstable self-image   Other Mood/Personality Symptoms:  No data recorded   Mental Status Exam Appearance and self-care  Stature:   Average   Weight:   Overweight   Clothing:   Casual   Grooming:   Normal   Cosmetic use:   None   Posture/gait:   Normal   Motor activity:   Not Remarkable   Sensorium  Attention:   Distractible   Concentration:   Focuses on irrelevancies   Orientation:   X5   Recall/memory:   Normal (Pt believes ECT has "messed with my memory" and has become obsessive about making sure he remembers things, ex: small details in movies he is watching)   Affect and Mood  Affect:   Depressed; Flat   Mood:   Depressed   Relating  Eye contact:   Fleeting   Facial expression:   Depressed   Attitude toward examiner:   Cooperative   Thought and Language  Speech flow:  Normal   Thought content:   Appropriate to Mood and Circumstances   Preoccupation:   Guilt; Ruminations  Hallucinations:   None   Organization:  No data recorded  [employer] of Knowledge:   Average   Intelligence:   Average   Abstraction:   Normal   Judgement:   Poor   Reality Testing:   Adequate   Insight:   Poor   Decision Making:   Paralyzed; Impulsive   Social Functioning  Social Maturity:   Impulsive   Social Judgement:   Victimized   Stress  Stressors:   Transitions; Illness; Family conflict; Grief/losses; Financial; Relationship; Work   Coping Ability:   Deficient supports   Skill Deficits:   Activities of daily living; Communication; Decision making; Interpersonal; Responsibility   Supports:   Support needed     Religion: Religion/Spirituality Are You A Religious  Person?: No  Leisure/Recreation: Leisure / Recreation Do You Have Hobbies?: No  Exercise/Diet: Exercise/Diet Do You Exercise?: No Have You Gained or Lost A Significant Amount of Weight in the Past Six Months?: No Do You Follow a Special Diet?: No Do You Have Any Trouble Sleeping?: No Explanation of Sleeping Difficulties: [name] reports over sleeping for coping   CCA Employment/Education Employment/Work Situation: Employment / Work Situation Employment Situation: Unemployed Patient's Job has Been Impacted by Current Illness: Yes Describe how Patient's Job has Been Impacted: Pt applied for disability 07/23 What is the Longest Time Patient has Held a Job?: 3 years in different departments Where was the Patient Employed at that Time?: [employer] in different departments as a Systems analyst Has Patient ever Been in the [employer]?: No  Education: Education Is Patient Currently Attending School?: No Did Garment/textile technologist From [employer]?: No Did You Product manager?: Yes Did Designer, television/film set?: No Did You Have Any Scientist, research (life sciences) In School?: Film making Did You Have An Individualized Education Program (IIEP): No Did You Have Any Difficulty At School?: Yes Patient's Education Has Been Impacted by Current Illness: No   CCA Family/Childhood History Family and Relationship History: Family history Marital status: Single Are you sexually active?: No What is your sexual orientation?: Straight Does patient have children?: No  Childhood History:  Childhood History By whom was/is the patient raised?: Both parents Description of patient's relationship with caregiver when they were a child: Pt report he had a good relationship with both parents when growing up. He also states he wished he wasn't an only child. Patient's description of current relationship with people who raised him/her: Strained with Mom and Dad Does patient have siblings?: No Did patient suffer any  verbal/emotional/physical/sexual abuse as a child?: No Did patient suffer from severe childhood neglect?: No Has patient ever been sexually abused/assaulted/raped as an adolescent or adult?: No Was the patient ever a victim of a crime or a disaster?: No Witnessed domestic violence?: No Has patient been affected by domestic violence as an adult?: No  Child/Adolescent Assessment:     CCA Substance Use Alcohol/Drug Use: Alcohol / Drug Use Pain Medications: Patient denies Prescriptions: see MAR Over the Counter: None  History of alcohol / drug use?: No history of alcohol / drug abuse Longest period of sobriety (when/how long): None                          ASAM's:  Six Dimensions of Multidimensional Assessment  Dimension 1:  Acute Intoxication and/or Withdrawal Potential:      Dimension 2:  Biomedical Conditions and Complications:      Dimension 3:  Emotional, Behavioral, or Cognitive Conditions and  Complications:     Dimension 4:  Readiness to Change:     Dimension 5:  Relapse, Continued use, or Continued Problem Potential:     Dimension 6:  Recovery/Living Environment:     ASAM Severity Score:    ASAM Recommended Level of Treatment:     Substance use Disorder (SUD)    Recommendations for Services/Supports/Treatments: Recommendations for Services/Supports/Treatments Recommendations For Services/Supports/Treatments: Partial Hospitalization (Pt is currently having SI and self-harming. Pt reports he has tried many treatments and wants to try something new to see if it will help.)  DSM5 Diagnoses: Patient Active Problem List   Diagnosis Date Noted   Suicide attempt (HCC) 11/17/2022   OSA (obstructive sleep apnea) 11/07/2022   Hyperlipidemia 06/03/2020   Bipolar I disorder, most recent episode depressed (HCC) 08/07/2017    Class: Chronic   Hoarseness of voice 10/04/2015   Tachycardia 08/30/2015   Leukocytosis 02/08/2015   Other specified hypothyroidism  02/08/2015   MDD (major depressive disorder), recurrent episode, severe (HCC) 12/01/2013   Substance induced mood disorder (HCC) 04/11/2012    Class: Acute   Severe episode of recurrent major depressive disorder (HCC) 04/09/2012    Class: Acute   Keratosis pilaris 02/21/2011    Patient Centered Plan: Patient is on the following Treatment Plan(s):  Depression   Referrals to Alternative Service(s): Referred to Alternative Service(s):   Place:   Date:   Time:    Referred to Alternative Service(s):   Place:   Date:   Time:    Referred to Alternative Service(s):   Place:   Date:   Time:    Referred to Alternative Service(s):   Place:   Date:   Time:      Collaboration of Care: Other referral by [facility]  Patient/Guardian was advised Release of Information must be obtained prior to any record release in order to collaborate their care with an outside provider. Patient/Guardian was advised if they have not already done so to contact the registration department to sign all necessary forms in order for [place] to release information regarding their care.   Consent: Patient/Guardian gives verbal consent for treatment and assignment of benefits for services provided during this visit. Patient/Guardian expressed understanding and agreed to proceed.   Alexander Harrington, [facility]

## 2022-11-27 ENCOUNTER — Other Ambulatory Visit (HOSPITAL_COMMUNITY): Payer: BC Managed Care – PPO

## 2022-11-27 ENCOUNTER — Telehealth (HOSPITAL_COMMUNITY): Payer: Self-pay | Admitting: Professional

## 2022-11-28 ENCOUNTER — Telehealth (HOSPITAL_COMMUNITY): Payer: Self-pay | Admitting: Licensed Clinical Social Worker

## 2022-11-28 ENCOUNTER — Telehealth (HOSPITAL_COMMUNITY): Payer: Self-pay | Admitting: Professional

## 2022-11-28 ENCOUNTER — Other Ambulatory Visit (HOSPITAL_COMMUNITY): Payer: BC Managed Care – PPO

## 2022-11-28 NOTE — Telephone Encounter (Signed)
Cln called back due to seeing missed call from pt while on another call. Pt's father answered the phone and informed cln that pt's mother died this morning after a sudden hospitalization last Friday. Cln expressed condolences and informed Mr. Beattie that pt can call us when he is ready for PHP and informed him that we are happy to provide resources for him as well if needed. Mr. Virola thanked cln and stated pt may want to start PHP the middle of next week.

## 2022-11-29 ENCOUNTER — Other Ambulatory Visit (HOSPITAL_COMMUNITY): Payer: BC Managed Care – PPO

## 2022-11-30 ENCOUNTER — Other Ambulatory Visit (HOSPITAL_COMMUNITY): Payer: BC Managed Care – PPO

## 2022-12-03 ENCOUNTER — Other Ambulatory Visit (HOSPITAL_COMMUNITY): Payer: BC Managed Care – PPO

## 2022-12-04 ENCOUNTER — Other Ambulatory Visit (HOSPITAL_COMMUNITY): Payer: BC Managed Care – PPO

## 2022-12-05 ENCOUNTER — Other Ambulatory Visit (HOSPITAL_COMMUNITY): Payer: BC Managed Care – PPO

## 2022-12-05 DIAGNOSIS — F413 Other mixed anxiety disorders: Secondary | ICD-10-CM | POA: Diagnosis not present

## 2022-12-06 ENCOUNTER — Other Ambulatory Visit (HOSPITAL_COMMUNITY): Payer: BC Managed Care – PPO

## 2022-12-07 ENCOUNTER — Other Ambulatory Visit (HOSPITAL_COMMUNITY): Payer: BC Managed Care – PPO

## 2022-12-07 DIAGNOSIS — F3181 Bipolar II disorder: Secondary | ICD-10-CM | POA: Diagnosis not present

## 2022-12-10 ENCOUNTER — Other Ambulatory Visit (HOSPITAL_COMMUNITY): Payer: BC Managed Care – PPO

## 2022-12-11 ENCOUNTER — Other Ambulatory Visit (HOSPITAL_COMMUNITY): Payer: BC Managed Care – PPO

## 2022-12-11 DIAGNOSIS — F3181 Bipolar II disorder: Secondary | ICD-10-CM | POA: Diagnosis not present

## 2022-12-12 ENCOUNTER — Other Ambulatory Visit (HOSPITAL_COMMUNITY): Payer: BC Managed Care – PPO

## 2022-12-13 ENCOUNTER — Other Ambulatory Visit (HOSPITAL_COMMUNITY): Payer: BC Managed Care – PPO

## 2022-12-13 DIAGNOSIS — F3181 Bipolar II disorder: Secondary | ICD-10-CM | POA: Diagnosis not present

## 2022-12-14 ENCOUNTER — Other Ambulatory Visit (HOSPITAL_COMMUNITY): Payer: BC Managed Care – PPO

## 2022-12-17 ENCOUNTER — Other Ambulatory Visit (HOSPITAL_COMMUNITY): Payer: BC Managed Care – PPO

## 2022-12-18 ENCOUNTER — Other Ambulatory Visit (HOSPITAL_COMMUNITY): Payer: BC Managed Care – PPO

## 2022-12-18 DIAGNOSIS — F410 Panic disorder [episodic paroxysmal anxiety] without agoraphobia: Secondary | ICD-10-CM | POA: Diagnosis not present

## 2022-12-18 DIAGNOSIS — F3181 Bipolar II disorder: Secondary | ICD-10-CM | POA: Diagnosis not present

## 2022-12-19 ENCOUNTER — Other Ambulatory Visit (HOSPITAL_COMMUNITY): Payer: BC Managed Care – PPO

## 2022-12-20 ENCOUNTER — Other Ambulatory Visit (HOSPITAL_COMMUNITY): Payer: BC Managed Care – PPO

## 2022-12-20 DIAGNOSIS — F3181 Bipolar II disorder: Secondary | ICD-10-CM | POA: Diagnosis not present

## 2022-12-21 ENCOUNTER — Other Ambulatory Visit (HOSPITAL_COMMUNITY): Payer: BC Managed Care – PPO

## 2022-12-24 DIAGNOSIS — F3181 Bipolar II disorder: Secondary | ICD-10-CM | POA: Diagnosis not present

## 2022-12-24 DIAGNOSIS — F410 Panic disorder [episodic paroxysmal anxiety] without agoraphobia: Secondary | ICD-10-CM | POA: Diagnosis not present

## 2022-12-26 ENCOUNTER — Other Ambulatory Visit: Payer: Self-pay | Admitting: Cardiovascular Disease

## 2022-12-26 DIAGNOSIS — F3181 Bipolar II disorder: Secondary | ICD-10-CM | POA: Diagnosis not present

## 2023-01-03 DIAGNOSIS — F4310 Post-traumatic stress disorder, unspecified: Secondary | ICD-10-CM | POA: Diagnosis not present

## 2023-01-04 DIAGNOSIS — F3181 Bipolar II disorder: Secondary | ICD-10-CM | POA: Diagnosis not present

## 2023-01-06 NOTE — Progress Notes (Deleted)
Cardiology Office Note:   Date:  01/06/2023  NAME:  Alexander Harrington    MRN: [id number] DOB:  1990/10/20   PCP:  Pearline Cables, MD  Cardiologist:  None  Electrophysiologist:  None   Referring MD: Pearline Cables, MD   No chief complaint on file. ***  History of Present Illness:   Alexander Harrington is a 32 y.o. male with a hx of bipolar disorder, HTN who presents for follow-up. Evaluated for positional dizziness and tachycardia. Monitor normal.   Problem List OSA Bipolar disorder  HLD  Past Medical History: Past Medical History:  Diagnosis Date   ADHD    Anxiety    Dr. Evelene Croon   Bipolar 2 disorder (HCC)    Depression    HLD (hyperlipidemia)    Insomnia    Sleep apnea    do not use CPAP    Past Surgical History: Past Surgical History:  Procedure Laterality Date   DRUG INDUCED ENDOSCOPY N/A 08/28/2022   Procedure: DRUG INDUCED ENDOSCOPY;  Surgeon: Christia Reading, MD;  Location: [facility];  Service: ENT;  Laterality: N/A;   OTHER SURGICAL HISTORY     sedated ect treatment   TONSILLECTOMY Bilateral 11/07/2022   Procedure: TONSILLECTOMY;  Surgeon: Christia Reading, MD;  Location: [facility] OR;  Service: ENT;  Laterality: Bilateral;    Current Medications: No outpatient medications have been marked as taking for the 01/07/23 encounter (Appointment) with Sande Rives, MD.     Allergies:    Codeine   Social History: Social History   Socioeconomic History   Marital status: Single    Spouse name: Not on file   Number of children: Not on file   Years of education: Not on file   Highest education level: Not on file  Occupational History   Occupation: student  Tobacco Use   Smoking status: Never   Smokeless tobacco: Never   Tobacco comments:    Patient does vape occasionally.  No nicotine  Vaping Use   Vaping Use: Never used  Substance and Sexual Activity   Alcohol use: Yes    Alcohol/week: 2.0 - 4.0 standard drinks of alcohol    Types: 2 - 4 Cans  of beer per week    Comment: weekly   Drug use: Yes    Frequency: 1.0 times per week    Types: Marijuana    Comment: Last use 11/05/22   Sexual activity: Not Currently    Birth control/protection: Condom  Other Topics Concern   Not on file  Social History Narrative   Caffienated drinks-no   Seat belt use often-yes   Regular Exercise-no   Smoke alarm in the home-yes   Firearms/guns in the home-no   History of physical abuse-no      04/08/2013 [name] was born in [place], [place], and grew up in [place], [place]. 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 He lives off campus with roommates. He denies any legal problems. He reports that he is spiritual but acting not stick. His hobbies include watching movies, reading, and writing. His social support system consists of his father, mother, friends, and cousins. 04/08/2013 AHW            Social Determinants of Health   Financial Resource Strain: Low Risk  (07/26/2017)   Overall Financial Resource Strain (CARDIA)    Difficulty of  Paying Living Expenses: Not hard at all  Food Insecurity: No Food Insecurity (09/17/2022)   Hunger Vital Sign    Worried About Running Out of Food in the Last Year: Never true    Ran Out of Food in the Last Year: Never true  Transportation Needs: No Transportation Needs (09/17/2022)   PRAPARE - Administrator, Civil Service (Medical): No    Lack of Transportation (Non-Medical): No  Physical Activity: Unknown (07/26/2017)   Exercise Vital Sign    Days of Exercise per Week: 0 days    Minutes of Exercise per Session: Not on file  Stress: Stress Concern Present (07/26/2017)   [employer] of Occupational Health - Occupational Stress Questionnaire    Feeling of Stress : Very much  Social Connections: Moderately Isolated (07/26/2017)   Social Connection and Isolation Panel  [NHANES]    Frequency of Communication with Friends and Family: More than three times a week    Frequency of Social Gatherings with Friends and Family: Twice a week    Attends Religious Services: Never    Database administrator or Organizations: No    Attends Engineer, structural: Never    Marital Status: Never married     Family History: The patient's ***family history includes Alcohol abuse in his maternal grandfather and maternal grandmother; Anxiety disorder in his mother; Arthritis in his mother; Depression in his mother; Drug abuse in his maternal grandfather and maternal grandmother; Mental illness in his mother.  ROS:   All other ROS reviewed and negative. Pertinent positives noted in the HPI.     EKGs/Labs/Other Studies Reviewed:   The following studies were personally reviewed by me today:  EKG:  EKG is *** ordered today.  The ekg ordered today demonstrates ***, and was personally reviewed by me.   Recent Labs: 06/04/2022: TSH 1.39 11/17/2022: ALT 35; BUN 9; Creatinine, Ser 0.82; Hemoglobin 14.2; Platelets 432; Potassium 3.5; Sodium 137   Recent Lipid Panel    Component Value Date/Time   CHOL 198 08/30/2022 1605   TRIG 396.0 (H) 08/30/2022 1605   HDL 41.30 08/30/2022 1605   CHOLHDL 5 08/30/2022 1605   VLDL 79.2 (H) 08/30/2022 1605   LDLCALC 163 (H) 11/09/2014 1434   LDLDIRECT 112.0 08/30/2022 1605    Physical Exam:   VS:  There were no vitals taken for this visit.   Wt Readings from Last 3 Encounters:  11/17/22 212 lb 15.4 oz (96.6 kg)  11/07/22 212 lb 15.4 oz (96.6 kg)  09/24/22 213 lb (96.6 kg)    General: Well nourished, well developed, in no acute distress Head: Atraumatic, normal size  Eyes: PEERLA, EOMI  Neck: Supple, no JVD Endocrine: No thryomegaly Cardiac: Normal S1, S2; RRR; no murmurs, rubs, or gallops Lungs: Clear to auscultation bilaterally, no wheezing, rhonchi or rales  Abd: Soft, nontender, no hepatomegaly  Ext: No edema, pulses  2+ Musculoskeletal: No deformities, BUE and BLE strength normal and equal Skin: Warm and dry, no rashes   Neuro: Alert and oriented to person, place, time, and situation, CNII-XII grossly intact, no focal deficits  Psych: Normal mood and affect   ASSESSMENT:   Jonmichael Beltrame is a 32 y.o. male who presents for the following: No diagnosis found.  PLAN:   There are no diagnoses linked to this encounter.  {Are you ordering a CV Procedure (e.g. stress test, cath, DCCV, TEE, etc)?   Press F2        :[id number]}  Disposition: No  follow-ups on file.  Medication Adjustments/Labs and Tests Ordered: Current medicines are reviewed at length with the patient today.  Concerns regarding medicines are outlined above.  No orders of the defined types were placed in this encounter.  No orders of the defined types were placed in this encounter.   There are no Patient Instructions on file for this visit.   Time Spent with Patient: I have spent a total of *** minutes with patient reviewing hospital notes, telemetry, EKGs, labs and examining the patient as well as establishing an assessment and plan that was discussed with the patient.  > 50% of time was spent in direct patient care.  Signed, Lenna Gilford. Flora Lipps, MD, [facility]  [facility]  [address] [place], [place] [address] [phone]  01/06/2023 8:36 AM

## 2023-01-07 ENCOUNTER — Ambulatory Visit: Payer: BC Managed Care – PPO | Attending: Cardiovascular Disease | Admitting: Cardiovascular Disease

## 2023-01-07 DIAGNOSIS — R002 Palpitations: Secondary | ICD-10-CM

## 2023-01-07 DIAGNOSIS — R42 Dizziness and giddiness: Secondary | ICD-10-CM

## 2023-01-07 DIAGNOSIS — F3181 Bipolar II disorder: Secondary | ICD-10-CM | POA: Diagnosis not present

## 2023-01-08 DIAGNOSIS — F3181 Bipolar II disorder: Secondary | ICD-10-CM | POA: Diagnosis not present

## 2023-01-08 DIAGNOSIS — F410 Panic disorder [episodic paroxysmal anxiety] without agoraphobia: Secondary | ICD-10-CM | POA: Diagnosis not present

## 2023-01-09 DIAGNOSIS — F4310 Post-traumatic stress disorder, unspecified: Secondary | ICD-10-CM | POA: Diagnosis not present

## 2023-01-11 DIAGNOSIS — F3181 Bipolar II disorder: Secondary | ICD-10-CM | POA: Diagnosis not present

## 2023-01-14 DIAGNOSIS — F3181 Bipolar II disorder: Secondary | ICD-10-CM | POA: Diagnosis not present

## 2023-01-23 DIAGNOSIS — F4310 Post-traumatic stress disorder, unspecified: Secondary | ICD-10-CM | POA: Diagnosis not present

## 2023-01-28 DIAGNOSIS — F3181 Bipolar II disorder: Secondary | ICD-10-CM | POA: Diagnosis not present

## 2023-01-30 DIAGNOSIS — F4310 Post-traumatic stress disorder, unspecified: Secondary | ICD-10-CM | POA: Diagnosis not present

## 2023-01-31 DIAGNOSIS — F3181 Bipolar II disorder: Secondary | ICD-10-CM | POA: Diagnosis not present

## 2023-02-04 DIAGNOSIS — F3181 Bipolar II disorder: Secondary | ICD-10-CM | POA: Diagnosis not present

## 2023-02-05 ENCOUNTER — Other Ambulatory Visit: Payer: Self-pay | Admitting: Pulmonary Disease

## 2023-02-05 DIAGNOSIS — F3181 Bipolar II disorder: Secondary | ICD-10-CM | POA: Diagnosis not present

## 2023-02-05 DIAGNOSIS — F410 Panic disorder [episodic paroxysmal anxiety] without agoraphobia: Secondary | ICD-10-CM | POA: Diagnosis not present

## 2023-02-07 DIAGNOSIS — F410 Panic disorder [episodic paroxysmal anxiety] without agoraphobia: Secondary | ICD-10-CM | POA: Diagnosis not present

## 2023-02-07 DIAGNOSIS — F3181 Bipolar II disorder: Secondary | ICD-10-CM | POA: Diagnosis not present

## 2023-02-08 DIAGNOSIS — F3181 Bipolar II disorder: Secondary | ICD-10-CM | POA: Diagnosis not present

## 2023-02-11 DIAGNOSIS — F3181 Bipolar II disorder: Secondary | ICD-10-CM | POA: Diagnosis not present

## 2023-02-13 DIAGNOSIS — F4310 Post-traumatic stress disorder, unspecified: Secondary | ICD-10-CM | POA: Diagnosis not present

## 2023-02-21 DIAGNOSIS — F3181 Bipolar II disorder: Secondary | ICD-10-CM | POA: Diagnosis not present

## 2023-03-08 NOTE — Progress Notes (Unsigned)
[facility] [address], [address] [place], [place] [address] [phone]  Date:  03/13/2023   Name:  Alexander Harrington   DOB:  1990-07-30   MRN:  [id number]  PCP:  Pearline Cables, MD    Chief Complaint: No chief complaint on file.   History of Present Illness:  Alexander Harrington is a 32 y.o. very pleasant male patient who presents with the following:  Patient seen today for follow-up- history of mood disorder, hypothyroidism and hyperlipidemia, sleep apnea   I last saw him in the office in February Very unfortunately in the interim [name]'s mother passed away of cancer.  He was seen for a suicide attempt at the end of April -he was evaluated and released to outpatient care  He did have a tonsillectomy to help with sleep apnea symptoms in earlier April  Lab Results  Component Value Date   TSH 1.39 06/04/2022    Patient Active Problem List   Diagnosis Date Noted   Suicide attempt (HCC) 11/17/2022   OSA (obstructive sleep apnea) 11/07/2022   Hyperlipidemia 06/03/2020   Bipolar I disorder, most recent episode depressed (HCC) 08/07/2017    Class: Chronic   Hoarseness of voice 10/04/2015   Tachycardia 08/30/2015   Leukocytosis 02/08/2015   Other specified hypothyroidism 02/08/2015   MDD (major depressive disorder), recurrent episode, severe (HCC) 12/01/2013   Substance induced mood disorder (HCC) 04/11/2012    Class: Acute   Severe episode of recurrent major depressive disorder (HCC) 04/09/2012    Class: Acute   Keratosis pilaris 02/21/2011    Past Medical History:  Diagnosis Date   ADHD    Anxiety    Dr. Evelene Croon   Bipolar 2 disorder (HCC)    Depression    HLD (hyperlipidemia)    Insomnia    Sleep apnea    do not use CPAP    Past Surgical History:  Procedure Laterality Date   DRUG INDUCED ENDOSCOPY N/A 08/28/2022   Procedure: DRUG INDUCED ENDOSCOPY;  Surgeon: Christia Reading, MD;  Location: [facility];   Service: ENT;  Laterality: N/A;   OTHER SURGICAL HISTORY     sedated ect treatment   TONSILLECTOMY Bilateral 11/07/2022   Procedure: TONSILLECTOMY;  Surgeon: Christia Reading, MD;  Location: [facility] OR;  Service: ENT;  Laterality: Bilateral;    Social History   Tobacco Use   Smoking status: Never   Smokeless tobacco: Never   Tobacco comments:    Patient does vape occasionally.  No nicotine  Vaping Use   Vaping status: Never Used  Substance Use Topics   Alcohol use: Yes    Alcohol/week: 2.0 - 4.0 standard drinks of alcohol    Types: 2 - 4 Cans of beer per week    Comment: weekly   Drug use: Yes    Frequency: 1.0 times per week    Types: Marijuana    Comment: Last use 11/05/22    Family History  Problem Relation Age of Onset   Arthritis Mother    Mental illness Mother    Depression Mother    Anxiety disorder Mother    Drug abuse Maternal Grandfather    Alcohol abuse Maternal Grandfather    Drug abuse Maternal Grandmother    Alcohol abuse Maternal Grandmother     Allergies  Allergen Reactions   Codeine Rash    Medication list has been reviewed and updated.  Current Outpatient Medications on File Prior to Visit  Medication Sig Dispense Refill   ALEVE 220 MG CAPS Take 220-440 mg by mouth 2 (two) times daily as needed (for pain or headaches).     clomiPRAMINE (ANAFRANIL) 25 MG capsule Take 25 mg by mouth See admin instructions. Beginning on 11/07/2022, take 25 mg by mouth at bedtime for 7 nights, then stop     clonazePAM (KLONOPIN) 1 MG tablet Take 1 mg by mouth 3 (three) times daily as needed for anxiety.     HYDROcodone-acetaminophen (HYCET) 7.5-325 mg/15 ml solution Take 15 mLs by mouth every 4 (four) hours as needed for moderate pain. 473 mL 0   lithium carbonate (LITHOBID) 300 MG CR tablet Take 900 mg by mouth at bedtime.     propranolol (INDERAL) 10 MG tablet TAKE 1 TABLET(10 MG) BY MOUTH TWICE DAILY 180 tablet 1   rosuvastatin (CRESTOR) 5 MG tablet TAKE 1 TABLET(5 MG) BY  MOUTH DAILY (Patient taking differently: Take 5 mg by mouth at bedtime.) 90 tablet 3   VYVANSE 10 MG capsule Take 10 mg by mouth daily.     zolpidem (AMBIEN) 10 MG tablet TAKE 1 TABLET(10 MG) BY MOUTH AT BEDTIME AS NEEDED FOR SLEEP 30 tablet 2   No current facility-administered medications on file prior to visit.    Review of Systems:  As per HPI- otherwise negative.   Physical Examination: There were no vitals filed for this visit. There were no vitals filed for this visit. There is no height or weight on file to calculate BMI. Ideal Body Weight:    GEN: no acute distress. HEENT: Atraumatic, Normocephalic.  Ears and Nose: No external deformity. CV: RRR, No M/G/R. No JVD. No thrill. No extra heart sounds. PULM: CTA B, no wheezes, crackles, rhonchi. No retractions. No resp. distress. No accessory muscle use. ABD: S, NT, ND, +BS. No rebound. No HSM. EXTR: No c/c/e PSYCH: Normally interactive. Conversant.    Assessment and Plan: ***  Signed Abbe Amsterdam, MD

## 2023-03-12 DIAGNOSIS — F3181 Bipolar II disorder: Secondary | ICD-10-CM | POA: Diagnosis not present

## 2023-03-12 DIAGNOSIS — F410 Panic disorder [episodic paroxysmal anxiety] without agoraphobia: Secondary | ICD-10-CM | POA: Diagnosis not present

## 2023-03-13 ENCOUNTER — Ambulatory Visit (INDEPENDENT_AMBULATORY_CARE_PROVIDER_SITE_OTHER): Payer: BC Managed Care – PPO | Admitting: Family Medicine

## 2023-03-13 VITALS — BP 110/62 | HR 71 | Temp 97.8°F | Resp 18 | Ht 70.0 in | Wt 212.0 lb

## 2023-03-13 DIAGNOSIS — Z5181 Encounter for therapeutic drug level monitoring: Secondary | ICD-10-CM

## 2023-03-13 DIAGNOSIS — F39 Unspecified mood [affective] disorder: Secondary | ICD-10-CM | POA: Diagnosis not present

## 2023-03-13 DIAGNOSIS — R5383 Other fatigue: Secondary | ICD-10-CM | POA: Diagnosis not present

## 2023-03-13 DIAGNOSIS — G4733 Obstructive sleep apnea (adult) (pediatric): Secondary | ICD-10-CM

## 2023-03-13 NOTE — Patient Instructions (Signed)
It was good to see you today- I will be in touch with your labs Recommend flu shot and covid booster this fall You may also want to contact Dr Flora Lipps- your heart doctor- and schedule a visit

## 2023-03-14 ENCOUNTER — Encounter: Payer: Self-pay | Admitting: Family Medicine

## 2023-03-14 DIAGNOSIS — F3181 Bipolar II disorder: Secondary | ICD-10-CM | POA: Diagnosis not present

## 2023-03-14 DIAGNOSIS — F410 Panic disorder [episodic paroxysmal anxiety] without agoraphobia: Secondary | ICD-10-CM | POA: Diagnosis not present

## 2023-03-14 LAB — BASIC METABOLIC PANEL
BUN: 10 mg/dL (ref 6–23)
CO2: 29 meq/L (ref 19–32)
Calcium: 9.7 mg/dL (ref 8.4–10.5)
Chloride: 103 meq/L (ref 96–112)
Creatinine, Ser: 0.88 mg/dL (ref 0.40–1.50)
GFR: 113.99 mL/min (ref >60.00)
Glucose, Bld: 81 mg/dL (ref 70–99)
Potassium: 4.2 mEq/L (ref 3.5–5.1)
Sodium: 140 meq/L (ref 135–145)

## 2023-03-14 LAB — LITHIUM LEVEL: Lithium Lvl: 0.3 mmol/L — ABNORMAL LOW (ref 0.6–1.2)

## 2023-03-18 ENCOUNTER — Inpatient Hospital Stay: Payer: BC Managed Care – PPO | Admitting: Family

## 2023-03-18 ENCOUNTER — Inpatient Hospital Stay: Payer: BC Managed Care – PPO

## 2023-03-18 DIAGNOSIS — F3181 Bipolar II disorder: Secondary | ICD-10-CM | POA: Diagnosis not present

## 2023-03-21 DIAGNOSIS — F3181 Bipolar II disorder: Secondary | ICD-10-CM | POA: Diagnosis not present

## 2023-03-26 ENCOUNTER — Inpatient Hospital Stay: Payer: BC Managed Care – PPO | Admitting: Family

## 2023-03-26 ENCOUNTER — Inpatient Hospital Stay: Payer: BC Managed Care – PPO | Attending: Family

## 2023-03-26 DIAGNOSIS — F410 Panic disorder [episodic paroxysmal anxiety] without agoraphobia: Secondary | ICD-10-CM | POA: Diagnosis not present

## 2023-03-26 DIAGNOSIS — F3181 Bipolar II disorder: Secondary | ICD-10-CM | POA: Diagnosis not present

## 2023-03-28 DIAGNOSIS — F3181 Bipolar II disorder: Secondary | ICD-10-CM | POA: Diagnosis not present

## 2023-04-01 DIAGNOSIS — F3181 Bipolar II disorder: Secondary | ICD-10-CM | POA: Diagnosis not present

## 2023-04-03 DIAGNOSIS — F3181 Bipolar II disorder: Secondary | ICD-10-CM | POA: Diagnosis not present

## 2023-04-08 DIAGNOSIS — F3181 Bipolar II disorder: Secondary | ICD-10-CM | POA: Diagnosis not present

## 2023-04-09 DIAGNOSIS — F410 Panic disorder [episodic paroxysmal anxiety] without agoraphobia: Secondary | ICD-10-CM | POA: Diagnosis not present

## 2023-04-09 DIAGNOSIS — F3181 Bipolar II disorder: Secondary | ICD-10-CM | POA: Diagnosis not present

## 2023-04-10 DIAGNOSIS — F4310 Post-traumatic stress disorder, unspecified: Secondary | ICD-10-CM | POA: Diagnosis not present

## 2023-04-11 DIAGNOSIS — F3181 Bipolar II disorder: Secondary | ICD-10-CM | POA: Diagnosis not present

## 2023-04-11 DIAGNOSIS — F410 Panic disorder [episodic paroxysmal anxiety] without agoraphobia: Secondary | ICD-10-CM | POA: Diagnosis not present

## 2023-04-15 DIAGNOSIS — F3181 Bipolar II disorder: Secondary | ICD-10-CM | POA: Diagnosis not present

## 2023-04-17 ENCOUNTER — Emergency Department (HOSPITAL_COMMUNITY): Payer: BC Managed Care – PPO

## 2023-04-17 ENCOUNTER — Other Ambulatory Visit: Payer: Self-pay

## 2023-04-17 ENCOUNTER — Emergency Department (HOSPITAL_COMMUNITY)
Admission: EM | Admit: 2023-04-17 | Discharge: 2023-04-18 | Disposition: A | Discharge status: Home / Self Care | Payer: BC Managed Care – PPO | Attending: Emergency Medicine | Admitting: Emergency Medicine

## 2023-04-17 DIAGNOSIS — S8991XA Unspecified injury of right lower leg, initial encounter: Secondary | ICD-10-CM | POA: Diagnosis not present

## 2023-04-17 DIAGNOSIS — Z23 Encounter for immunization: Secondary | ICD-10-CM | POA: Insufficient documentation

## 2023-04-17 DIAGNOSIS — F419 Anxiety disorder, unspecified: Secondary | ICD-10-CM | POA: Insufficient documentation

## 2023-04-17 DIAGNOSIS — S0081XA Abrasion of other part of head, initial encounter: Secondary | ICD-10-CM | POA: Insufficient documentation

## 2023-04-17 DIAGNOSIS — X810XXA Intentional self-harm by jumping or lying in front of motor vehicle, initial encounter: Secondary | ICD-10-CM | POA: Insufficient documentation

## 2023-04-17 DIAGNOSIS — F4310 Post-traumatic stress disorder, unspecified: Secondary | ICD-10-CM | POA: Diagnosis not present

## 2023-04-17 DIAGNOSIS — F32A Depression, unspecified: Secondary | ICD-10-CM | POA: Diagnosis not present

## 2023-04-17 DIAGNOSIS — S3993XA Unspecified injury of pelvis, initial encounter: Secondary | ICD-10-CM | POA: Diagnosis not present

## 2023-04-17 DIAGNOSIS — S0101XA Laceration without foreign body of scalp, initial encounter: Secondary | ICD-10-CM | POA: Insufficient documentation

## 2023-04-17 DIAGNOSIS — S299XXA Unspecified injury of thorax, initial encounter: Secondary | ICD-10-CM | POA: Diagnosis not present

## 2023-04-17 DIAGNOSIS — S0990XA Unspecified injury of head, initial encounter: Secondary | ICD-10-CM | POA: Diagnosis not present

## 2023-04-17 DIAGNOSIS — S0003XA Contusion of scalp, initial encounter: Secondary | ICD-10-CM | POA: Diagnosis not present

## 2023-04-17 DIAGNOSIS — T07XXXA Unspecified multiple injuries, initial encounter: Secondary | ICD-10-CM

## 2023-04-17 DIAGNOSIS — M48061 Spinal stenosis, lumbar region without neurogenic claudication: Secondary | ICD-10-CM | POA: Diagnosis not present

## 2023-04-17 DIAGNOSIS — M4186 Other forms of scoliosis, lumbar region: Secondary | ICD-10-CM | POA: Diagnosis not present

## 2023-04-17 DIAGNOSIS — M47816 Spondylosis without myelopathy or radiculopathy, lumbar region: Secondary | ICD-10-CM | POA: Diagnosis not present

## 2023-04-17 DIAGNOSIS — S99911A Unspecified injury of right ankle, initial encounter: Secondary | ICD-10-CM | POA: Diagnosis not present

## 2023-04-17 DIAGNOSIS — S3992XA Unspecified injury of lower back, initial encounter: Secondary | ICD-10-CM | POA: Diagnosis not present

## 2023-04-17 DIAGNOSIS — S99921A Unspecified injury of right foot, initial encounter: Secondary | ICD-10-CM | POA: Diagnosis not present

## 2023-04-17 LAB — CBC
HCT: 50.3 % (ref 39.0–52.0)
Hemoglobin: 16.3 g/dL (ref 13.0–17.0)
MCH: 27.5 pg (ref 26.0–34.0)
MCHC: 32.4 g/dL (ref 30.0–36.0)
MCV: 84.8 fL (ref 80.0–100.0)
Platelets: 528 10*3/uL — ABNORMAL HIGH (ref 150–400)
RBC: 5.93 MIL/uL — ABNORMAL HIGH (ref 4.22–5.81)
RDW: 12 % (ref 11.5–15.5)
WBC: 16.1 10*3/uL — ABNORMAL HIGH (ref 4.0–10.5)
nRBC: 0 % (ref 0.0–0.2)

## 2023-04-17 LAB — SALICYLATE LEVEL: Salicylate Lvl: <7 mg/dL — ABNORMAL LOW (ref 7.0–30.0)

## 2023-04-17 LAB — ETHANOL: Alcohol, Ethyl (B): <10 mg/dL (ref <10)

## 2023-04-17 LAB — COMPREHENSIVE METABOLIC PANEL
ALT: 21 U/L (ref 0–44)
AST: 22 U/L (ref 15–41)
Albumin: 4.9 g/dL (ref 3.5–5.0)
Alkaline Phosphatase: 51 U/L (ref 38–126)
Anion gap: 13 (ref 5–15)
BUN: 12 mg/dL (ref 6–20)
CO2: 19 mmol/L — ABNORMAL LOW (ref 22–32)
Calcium: 9.5 mg/dL (ref 8.9–10.3)
Chloride: 106 mmol/L (ref 98–111)
Creatinine, Ser: 1.23 mg/dL (ref 0.61–1.24)
GFR, Estimated: >60 mL/min (ref >60)
Glucose, Bld: 85 mg/dL (ref 70–99)
Potassium: 3.7 mmol/L (ref 3.5–5.1)
Sodium: 138 mmol/L (ref 135–145)
Total Bilirubin: 1 mg/dL (ref 0.3–1.2)
Total Protein: 8.8 g/dL — ABNORMAL HIGH (ref 6.5–8.1)

## 2023-04-17 LAB — LITHIUM LEVEL: Lithium Lvl: 0.35 mmol/L — ABNORMAL LOW (ref 0.60–1.20)

## 2023-04-17 LAB — ACETAMINOPHEN LEVEL: Acetaminophen (Tylenol), Serum: <10 ug/mL — ABNORMAL LOW (ref 10–30)

## 2023-04-17 MED ORDER — IBUPROFEN 600 MG PO TABS: 600.0000 mg | ORAL_TABLET | Freq: Four times a day (QID) | ORAL | 0 refills | Status: DC | PRN

## 2023-04-17 MED ORDER — SODIUM CHLORIDE 0.9 % IV BOLUS
1000.0000 mL | Freq: Once | INTRAVENOUS | Status: AC
  Administered 2023-04-17: 1000 mL via INTRAVENOUS

## 2023-04-17 MED ORDER — SODIUM CHLORIDE 0.9 % IV SOLN: INTRAVENOUS | Status: DC

## 2023-04-17 MED ORDER — MORPHINE SULFATE (PF) 4 MG/ML IV SOLN
4.0000 mg | Freq: Once | INTRAVENOUS | Status: AC
  Administered 2023-04-17: 4 mg via INTRAVENOUS
  Filled 2023-04-17: qty 1

## 2023-04-17 MED ORDER — LIDOCAINE-EPINEPHRINE (PF) 2 %-1:200000 IJ SOLN
10.0000 mL | Freq: Once | INTRAMUSCULAR | Status: AC
  Administered 2023-04-17: 10 mL
  Filled 2023-04-17: qty 20

## 2023-04-17 MED ORDER — HYDROCODONE-ACETAMINOPHEN 5-325 MG PO TABS
1.0000 | ORAL_TABLET | ORAL | 0 refills | Status: DC | PRN
Start: 2023-04-17 — End: 2023-07-11

## 2023-04-17 MED ORDER — DIAZEPAM 5 MG/ML IJ SOLN
2.5000 mg | Freq: Once | INTRAMUSCULAR | Status: AC
  Administered 2023-04-17: 2.5 mg via INTRAVENOUS
  Filled 2023-04-17: qty 2

## 2023-04-17 MED ORDER — TETANUS-DIPHTH-ACELL PERTUSSIS 5-2.5-18.5 LF-MCG/0.5 IM SUSY
0.5000 mL | PREFILLED_SYRINGE | Freq: Once | INTRAMUSCULAR | Status: AC
  Administered 2023-04-17: 0.5 mL via INTRAMUSCULAR
  Filled 2023-04-17: qty 0.5

## 2023-04-17 MED ORDER — ONDANSETRON HCL 4 MG/2ML IJ SOLN
4.0000 mg | Freq: Once | INTRAMUSCULAR | Status: AC
  Administered 2023-04-17: 4 mg via INTRAVENOUS
  Filled 2023-04-17: qty 2

## 2023-04-17 MED ORDER — BACITRACIN ZINC 500 UNIT/GM EX OINT
TOPICAL_OINTMENT | Freq: Two times a day (BID) | CUTANEOUS | Status: DC
  Administered 2023-04-17: 1 via TOPICAL
  Filled 2023-04-17: qty 1.8
  Filled 2023-04-17: qty 3.6
  Filled 2023-04-17: qty 1.8
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
## 2023-04-17 NOTE — ED Notes (Signed)
PT able to ambulate in hall with no assist c/o pain in right leg/ foot
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
## 2023-04-17 NOTE — ED Notes (Signed)
Pt cleared for discharge by EDP [place]. Concerns for SI and safety following discharge discussed with pt's father, charge nurse TJ, and Dr. Particia Nearing. Per EDP and pt's father, pt safe to discharge home with father. Verbal safety agreement discussed by EDP with pt and pts father. Charge RN TJ aware of agreement and continued plan for discharge. Per charge, no safety contract available for pt/father signature. Pt pending discharge at this time awaiting imaging.
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
## 2023-04-17 NOTE — ED Notes (Signed)
Report received by previous RN. Pt arrives from CT. Sts improvement of head pain to 7/10. Is alert and oriented. Father remains at bedside. VS updated. Call bell within reach. No c-collar in place on RN arrival.

## 2023-04-18 DIAGNOSIS — F3181 Bipolar II disorder: Secondary | ICD-10-CM | POA: Diagnosis not present

## 2023-04-18 NOTE — ED Notes (Signed)
Pt cleared for discharge by EDP [place]. Pt discharged home with father Jireh Lubinski. Discharge instructions were discussed with father and pt. Pt verbalized understanding with no additional questions at this time. Importance of keeping prescribed narcotic pain medication in a safe place discussed with father. Pt wheelchair to vehicle.

## 2023-04-22 DIAGNOSIS — F3181 Bipolar II disorder: Secondary | ICD-10-CM | POA: Diagnosis not present

## 2023-04-24 DIAGNOSIS — F4310 Post-traumatic stress disorder, unspecified: Secondary | ICD-10-CM | POA: Diagnosis not present

## 2023-04-25 DIAGNOSIS — F3181 Bipolar II disorder: Secondary | ICD-10-CM | POA: Diagnosis not present

## 2023-04-25 DIAGNOSIS — F410 Panic disorder [episodic paroxysmal anxiety] without agoraphobia: Secondary | ICD-10-CM | POA: Diagnosis not present
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
## 2023-04-28 NOTE — Patient Instructions (Incomplete)
It was good to see you again today!  Recommend COVID booster this fall- can get at your pharmacy Flu shot today Once the area on your forehead scabs over you might try Mederma scar gel- generic is also ok  Vitamin E gel can also help I will be in touch with your labs asap

## 2023-04-30 DIAGNOSIS — F3181 Bipolar II disorder: Secondary | ICD-10-CM | POA: Diagnosis not present

## 2023-04-30 DIAGNOSIS — F410 Panic disorder [episodic paroxysmal anxiety] without agoraphobia: Secondary | ICD-10-CM | POA: Diagnosis not present

## 2023-05-01 ENCOUNTER — Ambulatory Visit (INDEPENDENT_AMBULATORY_CARE_PROVIDER_SITE_OTHER): Payer: BC Managed Care – PPO | Admitting: Family Medicine

## 2023-05-01 VITALS — BP 120/80 | HR 90 | Temp 97.8°F | Resp 18 | Ht 70.0 in | Wt 208.8 lb

## 2023-05-01 DIAGNOSIS — Z4802 Encounter for removal of sutures: Secondary | ICD-10-CM

## 2023-05-01 DIAGNOSIS — F3181 Bipolar II disorder: Secondary | ICD-10-CM | POA: Diagnosis not present

## 2023-05-01 DIAGNOSIS — Z5181 Encounter for therapeutic drug level monitoring: Secondary | ICD-10-CM | POA: Diagnosis not present

## 2023-05-01 DIAGNOSIS — Z23 Encounter for immunization: Secondary | ICD-10-CM

## 2023-05-01 DIAGNOSIS — D72829 Elevated white blood cell count, unspecified: Secondary | ICD-10-CM

## 2023-05-01 DIAGNOSIS — Z8639 Personal history of other endocrine, nutritional and metabolic disease: Secondary | ICD-10-CM | POA: Diagnosis not present

## 2023-05-02 ENCOUNTER — Encounter: Payer: Self-pay | Admitting: Family Medicine

## 2023-05-02 DIAGNOSIS — F439 Reaction to severe stress, unspecified: Secondary | ICD-10-CM | POA: Diagnosis not present

## 2023-05-02 DIAGNOSIS — F331 Major depressive disorder, recurrent, moderate: Secondary | ICD-10-CM | POA: Diagnosis not present

## 2023-05-02 DIAGNOSIS — F411 Generalized anxiety disorder: Secondary | ICD-10-CM | POA: Diagnosis not present

## 2023-05-02 LAB — CBC
HCT: 47.2 % (ref 39.0–52.0)
Hemoglobin: 15.1 g/dL (ref 13.0–17.0)
MCHC: 32 g/dL (ref 30.0–36.0)
MCV: 86.6 fL (ref 78.0–100.0)
Platelets: 402 10*3/uL — ABNORMAL HIGH (ref 150.0–400.0)
RBC: 5.45 Mil/uL (ref 4.22–5.81)
RDW: 12.9 % (ref 11.5–15.5)
WBC: 17.1 10*3/uL — ABNORMAL HIGH (ref 4.0–10.5)

## 2023-05-02 LAB — TSH: TSH: 0.98 u[IU]/mL (ref 0.35–5.50)

## 2023-05-02 LAB — LITHIUM LEVEL: Lithium Lvl: 0.4 mmol/L — ABNORMAL LOW (ref 0.6–1.2)

## 2023-05-06 DIAGNOSIS — F3181 Bipolar II disorder: Secondary | ICD-10-CM | POA: Diagnosis not present

## 2023-05-07 ENCOUNTER — Other Ambulatory Visit: Payer: Self-pay | Admitting: Family Medicine

## 2023-05-07 DIAGNOSIS — D72829 Elevated white blood cell count, unspecified: Secondary | ICD-10-CM

## 2023-05-07 DIAGNOSIS — F410 Panic disorder [episodic paroxysmal anxiety] without agoraphobia: Secondary | ICD-10-CM | POA: Diagnosis not present

## 2023-05-07 DIAGNOSIS — F3181 Bipolar II disorder: Secondary | ICD-10-CM | POA: Diagnosis not present

## 2023-05-08 ENCOUNTER — Telehealth: Payer: Self-pay | Admitting: *Deleted

## 2023-05-08 DIAGNOSIS — F439 Reaction to severe stress, unspecified: Secondary | ICD-10-CM | POA: Diagnosis not present

## 2023-05-08 DIAGNOSIS — F331 Major depressive disorder, recurrent, moderate: Secondary | ICD-10-CM | POA: Diagnosis not present

## 2023-05-08 DIAGNOSIS — F411 Generalized anxiety disorder: Secondary | ICD-10-CM | POA: Diagnosis not present

## 2023-05-08 NOTE — Telephone Encounter (Signed)
This is an established patient. Called patient and was unable to lvm - mailbox is full - mailed calendar.

## 2023-05-09 DIAGNOSIS — F3181 Bipolar II disorder: Secondary | ICD-10-CM | POA: Diagnosis not present

## 2023-05-13 DIAGNOSIS — F331 Major depressive disorder, recurrent, moderate: Secondary | ICD-10-CM | POA: Diagnosis not present

## 2023-05-13 DIAGNOSIS — F439 Reaction to severe stress, unspecified: Secondary | ICD-10-CM | POA: Diagnosis not present

## 2023-05-13 DIAGNOSIS — F411 Generalized anxiety disorder: Secondary | ICD-10-CM | POA: Diagnosis not present

## 2023-05-14 DIAGNOSIS — F3181 Bipolar II disorder: Secondary | ICD-10-CM | POA: Diagnosis not present

## 2023-05-14 DIAGNOSIS — F410 Panic disorder [episodic paroxysmal anxiety] without agoraphobia: Secondary | ICD-10-CM | POA: Diagnosis not present

## 2023-05-15 DIAGNOSIS — F3181 Bipolar II disorder: Secondary | ICD-10-CM | POA: Diagnosis not present

## 2023-05-16 ENCOUNTER — Encounter: Payer: Self-pay | Admitting: Medical Oncology

## 2023-05-16 ENCOUNTER — Inpatient Hospital Stay (HOSPITAL_BASED_OUTPATIENT_CLINIC_OR_DEPARTMENT_OTHER): Payer: BC Managed Care – PPO | Admitting: Medical Oncology

## 2023-05-16 ENCOUNTER — Inpatient Hospital Stay: Payer: BC Managed Care – PPO | Attending: Family

## 2023-05-16 VITALS — BP 138/84 | HR 84 | Temp 98.0°F | Resp 19 | Wt 210.1 lb

## 2023-05-16 DIAGNOSIS — D72829 Elevated white blood cell count, unspecified: Secondary | ICD-10-CM | POA: Insufficient documentation

## 2023-05-16 DIAGNOSIS — Z79899 Other long term (current) drug therapy: Secondary | ICD-10-CM | POA: Insufficient documentation

## 2023-05-16 DIAGNOSIS — D696 Thrombocytopenia, unspecified: Secondary | ICD-10-CM | POA: Diagnosis not present

## 2023-05-16 DIAGNOSIS — D509 Iron deficiency anemia, unspecified: Secondary | ICD-10-CM | POA: Diagnosis not present

## 2023-05-16 DIAGNOSIS — D75839 Thrombocytosis, unspecified: Secondary | ICD-10-CM | POA: Insufficient documentation

## 2023-05-16 LAB — CMP (CANCER CENTER ONLY)
ALT: 18 U/L (ref 0–44)
AST: 12 U/L — ABNORMAL LOW (ref 15–41)
Albumin: 4.9 g/dL (ref 3.5–5.0)
Alkaline Phosphatase: 47 U/L (ref 38–126)
Anion gap: 8 (ref 5–15)
BUN: 13 mg/dL (ref 6–20)
CO2: 24 mmol/L (ref 22–32)
Calcium: 9.1 mg/dL (ref 8.9–10.3)
Chloride: 106 mmol/L (ref 98–111)
Creatinine: 0.92 mg/dL (ref 0.61–1.24)
GFR, Estimated: >60 mL/min (ref >60)
Glucose, Bld: 108 mg/dL — ABNORMAL HIGH (ref 70–99)
Potassium: 3.4 mmol/L — ABNORMAL LOW (ref 3.5–5.1)
Sodium: 138 mmol/L (ref 135–145)
Total Bilirubin: 0.3 mg/dL (ref 0.3–1.2)
Total Protein: 7.2 g/dL (ref 6.5–8.1)

## 2023-05-16 LAB — CBC WITH DIFFERENTIAL (CANCER CENTER ONLY)
Abs Immature Granulocytes: 0.06 10*3/uL (ref 0.00–0.07)
Basophils Absolute: 0.1 10*3/uL (ref 0.0–0.1)
Basophils Relative: 1 %
Eosinophils Absolute: 0.3 10*3/uL (ref 0.0–0.5)
Eosinophils Relative: 3 %
HCT: 43.3 % (ref 39.0–52.0)
Hemoglobin: 14.4 g/dL (ref 13.0–17.0)
Immature Granulocytes: 1 %
Lymphocytes Relative: 28 %
Lymphs Abs: 2.8 10*3/uL (ref 0.7–4.0)
MCH: 28 pg (ref 26.0–34.0)
MCHC: 33.3 g/dL (ref 30.0–36.0)
MCV: 84.2 fL (ref 80.0–100.0)
Monocytes Absolute: 0.6 10*3/uL (ref 0.1–1.0)
Monocytes Relative: 6 %
Neutro Abs: 6.3 10*3/uL (ref 1.7–7.7)
Neutrophils Relative %: 61 %
Platelet Count: 417 10*3/uL — ABNORMAL HIGH (ref 150–400)
RBC: 5.14 MIL/uL (ref 4.22–5.81)
RDW: 11.8 % (ref 11.5–15.5)
WBC Count: 10.1 10*3/uL (ref 4.0–10.5)
nRBC: 0 % (ref 0.0–0.2)

## 2023-05-16 LAB — LACTATE DEHYDROGENASE: LDH: 190 U/L (ref 98–192)

## 2023-05-16 LAB — IRON AND IRON BINDING CAPACITY (CC-WL,HP ONLY)
Iron: 50 ug/dL (ref 45–182)
Saturation Ratios: 14 % — ABNORMAL LOW (ref 17.9–39.5)
TIBC: 368 ug/dL (ref 250–450)
UIBC: 318 ug/dL (ref 117–376)

## 2023-05-16 LAB — SAVE SMEAR(SSMR), FOR PROVIDER SLIDE REVIEW

## 2023-05-16 LAB — FERRITIN: Ferritin: 111 ng/mL (ref 24–336)

## 2023-05-16 NOTE — Patient Instructions (Signed)
Pre-natal vitamin once daily if approved by your psychiatrist

## 2023-05-16 NOTE — Progress Notes (Signed)
Hematology and Oncology Follow Up Visit  Alexander Harrington [id number] [birth year] 32 y.o. 05/16/2023  Past Medical History:  Diagnosis Date   ADHD    Anxiety    Dr. Evelene Croon   Bipolar 2 disorder (HCC)    Depression    HLD (hyperlipidemia)    Insomnia    Sleep apnea    do not use CPAP    Principle Diagnosis:  Mild intermittent leukocytosis, reactive  Thrombocytopenia  Current Therapy:   Observation     Interim History:  Alexander Harrington is back for follow-up with a greater than 10 year history of mild intermittent leukocytosis and thrombocytosis. He is on Lithium.   At his initial visit his labs were: Today his WBC count is 9.9, Hgb 15.5, MCV 82, platelets 481. Neutrophils 44% and lymphocytes 46%.   Today he is here with his father. He reports that he has been having trouble with his mental health since his last visit. His mother passed away in 11-Jan-2023 from cancer and this has been really hard on him. He is working very closely with his psychiatrist and PCP regarding this.   There has been no bleeding to his knowledge: denies epistaxis, gingivitis, hemoptysis, hematemesis, hematuria, melena, excessive bruising, blood donation.  Appetite is poor    Wt Readings from Last 3 Encounters:  05/16/23 210 lb 1.9 oz (95.3 kg)  05/01/23 208 lb 12.8 oz (94.7 kg)  04/17/23 210 lb (95.3 kg)     Medications:   Current Outpatient Medications:    BELSOMRA 10 MG TABS, Take 1 tablet by mouth at bedtime., Disp: , Rfl:    clonazePAM (KLONOPIN) 1 MG tablet, Take 1 mg by mouth 3 (three) times daily as needed for anxiety., Disp: , Rfl:    fluvoxaMINE (LUVOX) 50 MG tablet, Take 50 mg by mouth at bedtime., Disp: , Rfl:    gabapentin (NEURONTIN) 100 MG capsule, Take 100 mg by mouth 3 (three) times daily., Disp: , Rfl:    lamoTRIgine (LAMICTAL) 25 MG tablet, Take 25 mg by mouth daily., Disp: , Rfl:    lithium carbonate (LITHOBID) 300 MG CR tablet, Take 1,200 mg by mouth at bedtime., Disp: , Rfl:    prazosin  (MINIPRESS) 1 MG capsule, Take 1 mg by mouth at bedtime., Disp: , Rfl:    propranolol (INDERAL) 10 MG tablet, TAKE 1 TABLET(10 MG) BY MOUTH TWICE DAILY, Disp: 180 tablet, Rfl: 1   rosuvastatin (CRESTOR) 5 MG tablet, TAKE 1 TABLET(5 MG) BY MOUTH DAILY (Patient taking differently: Take 5 mg by mouth at bedtime.), Disp: 90 tablet, Rfl: 3   Suvorexant (BELSOMRA PO), Take by mouth., Disp: , Rfl:    VYVANSE 20 MG capsule, Take 20 mg by mouth daily., Disp: , Rfl:    HYDROcodone-acetaminophen (HYCET) 7.5-325 mg/15 ml solution, Take 15 mLs by mouth every 4 (four) hours as needed for moderate pain. (Patient not taking: Reported on 05/16/2023), Disp: 473 mL, Rfl: 0   HYDROcodone-acetaminophen (NORCO/VICODIN) 5-325 MG tablet, Take 1 tablet by mouth every 4 (four) hours as needed. (Patient not taking: Reported on 05/16/2023), Disp: 10 tablet, Rfl: 0   zolpidem (AMBIEN) 10 MG tablet, TAKE 1 TABLET(10 MG) BY MOUTH AT BEDTIME AS NEEDED FOR SLEEP (Patient not taking: Reported on 05/16/2023), Disp: 30 tablet, Rfl: 2  Allergies:  Allergies  Allergen Reactions   Codeine Rash    Past Medical History, Surgical history, Social history, and Family History were reviewed and updated.  Review of Systems: As stated above in HPI  Physical Exam:  weight is 210 lb 1.9 oz (95.3 kg). His oral temperature is 98 F (36.7 C). His blood pressure is 138/84 and his pulse is 84. His respiration is 19 and oxygen saturation is 99%.   Physical Exam General: NAD Cardiovascular: regular rate and rhythm Pulmonary: clear ant fields Abdomen: soft, nontender, + bowel sounds GU: no suprapubic tenderness Extremities: no edema, no joint deformities Skin: no rashes Neurological: Weakness but otherwise nonfocal   Lab Results  Component Value Date   WBC 10.1 05/16/2023   HGB 14.4 05/16/2023   HCT 43.3 05/16/2023   MCV 84.2 05/16/2023   PLT 417 (H) 05/16/2023     Chemistry      Component Value Date/Time   NA 138 05/16/2023  1051   K 3.4 (L) 05/16/2023 1051   CL 106 05/16/2023 1051   CO2 24 05/16/2023 1051   BUN 13 05/16/2023 1051   CREATININE 0.92 05/16/2023 1051      Component Value Date/Time   CALCIUM 9.1 05/16/2023 1051   ALKPHOS 47 05/16/2023 1051   AST 12 (L) 05/16/2023 1051   ALT 18 05/16/2023 1051   BILITOT 0.3 05/16/2023 1051     Encounter Diagnoses  Name Primary?   Leukocytosis, unspecified type Yes   Thrombocytosis    Iron deficiency anemia, unspecified iron deficiency anemia type     Assessment and Plan- Patient is a 32 y.o. male with mild intermittent leukocytosis and thrombocytosis felt to be secondary to poor nutrition and antipsychotic medications.    Reviewed labs with patient. Labs look stable to improved.  Discussed the importance of proper nutrition- he is willing to make some adjustments We discussed his anxiety related to coming to a Cancer center for follow up. At this time the concern for his lab abnormalities are extremely low. At this time he elects to continue monitoring by his PCP but is aware that we would be happy to follow up if/when needed.      PRN follow up per patient preference    Clent Jacks PA-C 10/24/202412:59 PM

## 2023-05-20 DIAGNOSIS — F411 Generalized anxiety disorder: Secondary | ICD-10-CM | POA: Diagnosis not present

## 2023-05-20 DIAGNOSIS — F439 Reaction to severe stress, unspecified: Secondary | ICD-10-CM | POA: Diagnosis not present

## 2023-05-20 DIAGNOSIS — F331 Major depressive disorder, recurrent, moderate: Secondary | ICD-10-CM | POA: Diagnosis not present

## 2023-05-21 DIAGNOSIS — F3181 Bipolar II disorder: Secondary | ICD-10-CM | POA: Diagnosis not present

## 2023-05-24 ENCOUNTER — Telehealth: Payer: Self-pay

## 2023-05-24 DIAGNOSIS — F439 Reaction to severe stress, unspecified: Secondary | ICD-10-CM | POA: Diagnosis not present

## 2023-05-24 DIAGNOSIS — F331 Major depressive disorder, recurrent, moderate: Secondary | ICD-10-CM | POA: Diagnosis not present

## 2023-05-24 DIAGNOSIS — F411 Generalized anxiety disorder: Secondary | ICD-10-CM | POA: Diagnosis not present

## 2023-05-24 NOTE — Telephone Encounter (Signed)
Transition Care Management Unsuccessful Follow-up Telephone Call  Date of discharge and from where:  [facility] 9/26  Attempts:  1st Attempt  Reason for unsuccessful TCM follow-up call:  No answer/busy   Alexander Harrington [place]  [facility], [facility] Guide, Phone: [phone] Website: [email or web address]

## 2023-05-27 ENCOUNTER — Telehealth: Payer: Self-pay

## 2023-05-27 DIAGNOSIS — F3181 Bipolar II disorder: Secondary | ICD-10-CM | POA: Diagnosis not present

## 2023-05-27 NOTE — Telephone Encounter (Signed)
Transition Care Management Unsuccessful Follow-up Telephone Call  Date of discharge and from where:  [facility]  Attempts:  2nd Attempt  Reason for unsuccessful TCM follow-up call:  No answer/busy   Lenard Forth [place]  [facility], [facility] Guide, Phone: [phone] Website: [email or web address]

## 2023-05-29 DIAGNOSIS — F411 Generalized anxiety disorder: Secondary | ICD-10-CM | POA: Diagnosis not present

## 2023-05-29 DIAGNOSIS — F439 Reaction to severe stress, unspecified: Secondary | ICD-10-CM | POA: Diagnosis not present

## 2023-05-29 DIAGNOSIS — F331 Major depressive disorder, recurrent, moderate: Secondary | ICD-10-CM | POA: Diagnosis not present

## 2023-06-03 DIAGNOSIS — F3181 Bipolar II disorder: Secondary | ICD-10-CM | POA: Diagnosis not present

## 2023-06-06 DIAGNOSIS — F411 Generalized anxiety disorder: Secondary | ICD-10-CM | POA: Diagnosis not present

## 2023-06-06 DIAGNOSIS — F439 Reaction to severe stress, unspecified: Secondary | ICD-10-CM | POA: Diagnosis not present

## 2023-06-06 DIAGNOSIS — F331 Major depressive disorder, recurrent, moderate: Secondary | ICD-10-CM | POA: Diagnosis not present

## 2023-06-10 DIAGNOSIS — F3181 Bipolar II disorder: Secondary | ICD-10-CM | POA: Diagnosis not present

## 2023-06-11 DIAGNOSIS — F411 Generalized anxiety disorder: Secondary | ICD-10-CM | POA: Diagnosis not present

## 2023-06-11 DIAGNOSIS — F439 Reaction to severe stress, unspecified: Secondary | ICD-10-CM | POA: Diagnosis not present

## 2023-06-11 DIAGNOSIS — F331 Major depressive disorder, recurrent, moderate: Secondary | ICD-10-CM | POA: Diagnosis not present

## 2023-06-12 ENCOUNTER — Encounter: Payer: Self-pay | Admitting: Family Medicine

## 2023-06-12 DIAGNOSIS — E663 Overweight: Secondary | ICD-10-CM

## 2023-06-13 DIAGNOSIS — F410 Panic disorder [episodic paroxysmal anxiety] without agoraphobia: Secondary | ICD-10-CM | POA: Diagnosis not present

## 2023-06-13 DIAGNOSIS — F3181 Bipolar II disorder: Secondary | ICD-10-CM | POA: Diagnosis not present

## 2023-06-14 DIAGNOSIS — F411 Generalized anxiety disorder: Secondary | ICD-10-CM | POA: Diagnosis not present

## 2023-06-14 DIAGNOSIS — F439 Reaction to severe stress, unspecified: Secondary | ICD-10-CM | POA: Diagnosis not present

## 2023-06-14 DIAGNOSIS — F331 Major depressive disorder, recurrent, moderate: Secondary | ICD-10-CM | POA: Diagnosis not present

## 2023-06-17 DIAGNOSIS — F3181 Bipolar II disorder: Secondary | ICD-10-CM | POA: Diagnosis not present

## 2023-06-25 DIAGNOSIS — F3181 Bipolar II disorder: Secondary | ICD-10-CM | POA: Diagnosis not present

## 2023-07-01 DIAGNOSIS — F3181 Bipolar II disorder: Secondary | ICD-10-CM | POA: Diagnosis not present

## 2023-07-03 DIAGNOSIS — F411 Generalized anxiety disorder: Secondary | ICD-10-CM | POA: Diagnosis not present

## 2023-07-03 DIAGNOSIS — F439 Reaction to severe stress, unspecified: Secondary | ICD-10-CM | POA: Diagnosis not present

## 2023-07-03 DIAGNOSIS — F331 Major depressive disorder, recurrent, moderate: Secondary | ICD-10-CM | POA: Diagnosis not present

## 2023-07-04 DIAGNOSIS — Z733 Stress, not elsewhere classified: Secondary | ICD-10-CM | POA: Diagnosis not present

## 2023-07-04 DIAGNOSIS — R5383 Other fatigue: Secondary | ICD-10-CM | POA: Diagnosis not present

## 2023-07-04 DIAGNOSIS — G4733 Obstructive sleep apnea (adult) (pediatric): Secondary | ICD-10-CM | POA: Diagnosis not present

## 2023-07-08 DIAGNOSIS — F3181 Bipolar II disorder: Secondary | ICD-10-CM | POA: Diagnosis not present

## 2023-07-09 DIAGNOSIS — F411 Generalized anxiety disorder: Secondary | ICD-10-CM | POA: Diagnosis not present

## 2023-07-09 DIAGNOSIS — F439 Reaction to severe stress, unspecified: Secondary | ICD-10-CM | POA: Diagnosis not present

## 2023-07-09 DIAGNOSIS — F331 Major depressive disorder, recurrent, moderate: Secondary | ICD-10-CM | POA: Diagnosis not present

## 2023-07-10 NOTE — Progress Notes (Unsigned)
Cardiology Office Note    Date:  07/11/2023  ID:  Alexander Harrington, DOB 12-Aug-1990, MRN [id number] PCP:  Pearline Cables, MD  Cardiologist:  Reatha Harps, MD  Electrophysiologist:  None   Chief Complaint: Follow up for fatigue   History of Present Illness: .    Alexander Harrington is a 32 y.o. male with visit-pertinent history of OSA, bipolar disorder, panic attacks  First evaluated by Dr. Flora Lipps in 09/2022 at the request of his PCP for dizziness and fatigue.  He noted dizziness with prolonged standing or position changes.  He also reported feeling his heart racing and chest tightness.  Patient was concerned about POTS, he noted that his heart raced mainly with anxiety.  Did not really occur with position changes.  It was questioned if his several years of weakness and fatigue was related to psychoactive medications or untreated sleep apnea.  He was started on propranolol 10 mg twice daily.  Cardiac monitor worn for 4 days and 5 hours showed an average heart rate of 81 bpm, ranging from 47 to 168 bpm.  Predominant underlying rhythm was sinus rhythm.  There was rare ectopy.  Today he presents for follow-up.  He reports that he is doing okay today, notes that he has ongoing problems with intermittent dizziness, fatigue high heart rate.  She is unsure if this is related to his mental health and history of PTSD.  He notes that he gets easily overwhelmed in certain situations such as when at the grocery store.  He reports that when he starts walk around stores he becomes dizzy and feels that he needs to sit down.  He notes that he has been having with his PCP and psychiatrist on his medication regimen, his propranolol was discontinued 2 weeks ago out of concern for potentially increasing his fatigue.  He is unsure if he noticed any difference being on propranolol versus being off.  He reports that he has had a difficult few months since his last visit as his mom passed away and he has had 2 mental health  crises since.  He did undergo tonsillectomy earlier this year, plans to undergo sleep study soon to reevaluate his sleep apnea.  ROS: .   Today he denies chest pain, lower extremity edema, melena, hematuria, hemoptysis, diaphoresis, presyncope, syncope, orthopnea, and PND.  All other systems are reviewed and otherwise negative. Studies Reviewed: Marland Kitchen    EKG:  EKG is ordered today, personally reviewed, demonstrating  EKG Interpretation Date/Time:  Thursday July 11 2023 14:56:27 EST Ventricular Rate:  92 PR Interval:  132 QRS Duration:  82 QT Interval:  334 QTC Calculation: 413 R Axis:   72  Text Interpretation: Normal sinus rhythm Normal ECG When compared with ECG of 17-Nov-2022 06:46, No significant change since last tracing Confirmed by Reather Littler [phone]) on 07/11/2023 3:04:01 PM   CV Studies:  Cardiac Studies & Procedures        MONITORS  LONG TERM MONITOR (3-14 DAYS) 10/15/2022  Narrative Patch Wear Time:  4 days and 5 hours (2024-03-12T16:09:00-0400 to 2024-03-16T21:10:10-398)  Patient had a min HR of 47 bpm (sinus bradycardia), max HR of 168 bpm (sinus tachycardia), and avg HR of 81 bpm (normal sinus rhythm). Predominant underlying rhythm was Sinus Rhythm. Isolated SVEs were rare (<1.0%), and no SVE Couplets or SVE Triplets were present. Isolated VEs were rare (<1.0%), and no VE Couplets  or VE Triplets were present.  Impression: No arrhythmias detected. Rare ectopy.  Gerri Spore T. O'Neal,  MD, [facility]  [facility] [address] [place], [place] [address] [phone] 8:54 PM            Current Reported Medications:.    Current Meds  Medication Sig   BELSOMRA 10 MG TABS Take 1 tablet by mouth at bedtime.   clonazePAM (KLONOPIN) 1 MG tablet Take 1 mg by mouth 3 (three) times daily as needed for anxiety.   gabapentin (NEURONTIN) 100 MG capsule Take 100 mg by mouth 3 (three) times daily.   lamoTRIgine (LAMICTAL) 25 MG tablet Take 25 mg by  mouth daily.   lithium carbonate (LITHOBID) 300 MG CR tablet Take 1,200 mg by mouth at bedtime.   prazosin (MINIPRESS) 1 MG capsule Take 1 mg by mouth at bedtime.   rosuvastatin (CRESTOR) 5 MG tablet TAKE 1 TABLET(5 MG) BY MOUTH DAILY (Patient taking differently: Take 5 mg by mouth at bedtime.)   Suvorexant (BELSOMRA PO) Take by mouth.   VYVANSE 20 MG capsule Take 20 mg by mouth daily.   [DISCONTINUED] fluvoxaMINE (LUVOX) 50 MG tablet Take 50 mg by mouth at bedtime.   [DISCONTINUED] HYDROcodone-acetaminophen (HYCET) 7.5-325 mg/15 ml solution Take 15 mLs by mouth every 4 (four) hours as needed for moderate pain.   [DISCONTINUED] HYDROcodone-acetaminophen (NORCO/VICODIN) 5-325 MG tablet Take 1 tablet by mouth every 4 (four) hours as needed.   [DISCONTINUED] zolpidem (AMBIEN) 10 MG tablet TAKE 1 TABLET(10 MG) BY MOUTH AT BEDTIME AS NEEDED FOR SLEEP   Physical Exam:    VS:  BP 116/80   Pulse 92   Ht 5\' 10"  (1.778 m)   Wt 219 lb (99.3 kg)   SpO2 97%   BMI 31.42 kg/m    Wt Readings from Last 3 Encounters:  07/11/23 219 lb (99.3 kg)  05/16/23 210 lb 1.9 oz (95.3 kg)  05/01/23 208 lb 12.8 oz (94.7 kg)    GEN: Well nourished, well developed in no acute distress NECK: No JVD; No carotid bruits CARDIAC: RRR, no murmurs, rubs, gallops RESPIRATORY:  Clear to auscultation without rales, wheezing or rhonchi  ABDOMEN: Soft, non-tender, non-distended EXTREMITIES:  No edema; No acute deformity   Asessement and Plan:.    Weakness/fatigue/Palpitations: In 09/2022 patient reported years of intermittent dizziness, weakness and fatigue as well as palpitations.  He reported the palpitations have been getting worse and was concern for POTS, noted heart rates manage anxiety.  Cardiac monitor showed an average heart rate of 81 bpm, ranging from 47 to 168 bpm.  Predominant underlying rhythm was sinus rhythm.  There was rare ectopy.  He was started on propranolol 10 mg twice daily.  Today he reports ongoing  weakness, fatigue, intermittent dizziness and high heart rates. He denies presyncope or syncope. His propranolol was discontinued 2 weeks ago by another provider for concern of causing increased fatigue.  He notes he had not noticed a significant difference being on propranolol.  He has had numerous medication changes in recent weeks.  Through shared decision making, elected to not restart propranolol or start any other medications, given his numerous medication changes.  He questions if a majority of his symptoms stem from his mental health.  He does note some occasional shortness of breath, will check an echocardiogram.  Shortness of breath: Patient endorses some intermittent shortness of breath.  He questions if potentially related to deconditioning.  He denies lower extremity edema.  Given palpitations/increased heart rate and shortness of breath will check echocardiogram.    OSA: Patient notes  that he has a history of sleep apnea, he has previously been unable to tolerate CPAP.  He had his tonsils removed earlier this year and plan to undergo repeat sleep study to see if improvement in his sleep apnea.  If no significant improvement he plans to undergo inspire device implant.   Disposition: F/u with Dr. Flora Lipps in three months.   Signed, Rip Harbour, NP

## 2023-07-11 ENCOUNTER — Ambulatory Visit: Payer: BC Managed Care – PPO | Attending: Cardiology | Admitting: Cardiology

## 2023-07-11 ENCOUNTER — Encounter: Payer: Self-pay | Admitting: Cardiology

## 2023-07-11 VITALS — BP 116/80 | HR 92 | Ht 70.0 in | Wt 219.0 lb

## 2023-07-11 DIAGNOSIS — R531 Weakness: Secondary | ICD-10-CM | POA: Diagnosis not present

## 2023-07-11 DIAGNOSIS — R0602 Shortness of breath: Secondary | ICD-10-CM

## 2023-07-11 DIAGNOSIS — R42 Dizziness and giddiness: Secondary | ICD-10-CM

## 2023-07-11 DIAGNOSIS — G4733 Obstructive sleep apnea (adult) (pediatric): Secondary | ICD-10-CM

## 2023-07-11 DIAGNOSIS — R002 Palpitations: Secondary | ICD-10-CM | POA: Diagnosis not present

## 2023-07-11 DIAGNOSIS — J3089 Other allergic rhinitis: Secondary | ICD-10-CM | POA: Diagnosis not present

## 2023-07-11 DIAGNOSIS — H6121 Impacted cerumen, right ear: Secondary | ICD-10-CM | POA: Diagnosis not present

## 2023-07-11 NOTE — Patient Instructions (Addendum)
Medication Instructions:  No changes *If you need a refill on your cardiac medications before your next appointment, please call your pharmacy*  Lab Work: No labs If you have labs (blood work) drawn today and your tests are completely normal, you will receive your results only by: MyChart Message (if you have MyChart) OR A paper copy in the mail If you have any lab test that is abnormal or we need to change your treatment, we will call you to review the results.  Testing/Procedures: Your physician has requested that you have an echocardiogram. Echocardiography is a painless test that uses sound waves to create images of your heart. It provides your doctor with information about the size and shape of your heart and how well your heart's chambers and valves are working. This procedure takes approximately one hour. There are no restrictions for this procedure. Please do NOT wear cologne, perfume, aftershave, or lotions (deodorant is allowed). Please arrive 15 minutes prior to your appointment time.  Please note: We ask at that you not bring children with you during ultrasound (echo/ vascular) testing. Due to room size and safety concerns, children are not allowed in the ultrasound rooms during exams. Our front office staff cannot provide observation of children in our lobby area while testing is being conducted. An adult accompanying a patient to their appointment will only be allowed in the ultrasound room at the discretion of the ultrasound technician under special circumstances. We apologize for any inconvenience.  Follow-Up: At [facility], you and your health needs are our priority.  As part of our continuing mission to provide you with exceptional heart care, we have created designated Provider Care Teams.  These Care Teams include your primary Cardiologist (physician) and Advanced Practice Providers (APPs -  Physician Assistants and Nurse Practitioners) who all work together to  provide you with the care you need, when you need it.  We recommend signing up for the patient portal called "MyChart".  Sign up information is provided on this After Visit Summary.  MyChart is used to connect with patients for Virtual Visits (Telemedicine).  Patients are able to view lab/test results, encounter notes, upcoming appointments, etc.  Non-urgent messages can be sent to your provider as well.   To learn more about what you can do with MyChart, go to [email or web address].    Your next appointment:   3 month(s)  Provider:   Reatha Harps, MD

## 2023-07-25 DIAGNOSIS — F439 Reaction to severe stress, unspecified: Secondary | ICD-10-CM | POA: Diagnosis not present

## 2023-07-25 DIAGNOSIS — F411 Generalized anxiety disorder: Secondary | ICD-10-CM | POA: Diagnosis not present

## 2023-07-25 DIAGNOSIS — F331 Major depressive disorder, recurrent, moderate: Secondary | ICD-10-CM | POA: Diagnosis not present

## 2023-07-29 DIAGNOSIS — F3181 Bipolar II disorder: Secondary | ICD-10-CM | POA: Diagnosis not present

## 2023-07-30 ENCOUNTER — Other Ambulatory Visit: Payer: Self-pay | Admitting: Family Medicine

## 2023-07-30 DIAGNOSIS — E785 Hyperlipidemia, unspecified: Secondary | ICD-10-CM

## 2023-08-02 DIAGNOSIS — F3181 Bipolar II disorder: Secondary | ICD-10-CM | POA: Diagnosis not present

## 2023-08-11 NOTE — Progress Notes (Deleted)
[facility] [address], [address] [place], [place] [address] [phone]  Date:  08/14/2023   Name:  Alexander Harrington   DOB:  26-Dec-1990   MRN:  [id number]  PCP:  Pearline Cables, MD    Chief Complaint: No chief complaint on file.   History of Present Illness:  Alexander Harrington is a 33 y.o. very pleasant male patient who presents with the following:  Pt seen today for recheck visit  Last seen by myself in October  history of mood disorder, hypothyroidism and hyperlipidemia, sleep apnea    Alexander Harrington has long struggled with depression and suicidality, his mother passed away in 08-27-22 which has been really hard  Lab Results  Component Value Date   HGBA1C 5.0 06/04/2022   Lab Results  Component Value Date   TSH 0.98 05/01/2023    Patient Active Problem List   Diagnosis Date Noted   Suicide attempt (HCC) 11/17/2022   OSA (obstructive sleep apnea) 11/07/2022   Hyperlipidemia 06/03/2020   Bipolar I disorder, most recent episode depressed (HCC) 08/07/2017    Class: Chronic   Hoarseness of voice 10/04/2015   Tachycardia 08/30/2015   Leukocytosis 02/08/2015   Other specified hypothyroidism 02/08/2015   MDD (major depressive disorder), recurrent episode, severe (HCC) 12/01/2013   Substance induced mood disorder (HCC) 04/11/2012    Class: Acute   Severe episode of recurrent major depressive disorder (HCC) 04/09/2012    Class: Acute   Keratosis pilaris 02/21/2011    Past Medical History:  Diagnosis Date   ADHD    Anxiety    Dr. Evelene Croon   Bipolar 2 disorder (HCC)    Depression    HLD (hyperlipidemia)    Insomnia    Sleep apnea    do not use CPAP    Past Surgical History:  Procedure Laterality Date   DRUG INDUCED ENDOSCOPY N/A 08/28/2022   Procedure: DRUG INDUCED ENDOSCOPY;  Surgeon: Christia Reading, MD;  Location: [facility];  Service: ENT;  Laterality: N/A;   OTHER SURGICAL HISTORY     sedated ect treatment    TONSILLECTOMY Bilateral 11/07/2022   Procedure: TONSILLECTOMY;  Surgeon: Christia Reading, MD;  Location: [facility] OR;  Service: ENT;  Laterality: Bilateral;    Social History   Tobacco Use   Smoking status: Never   Smokeless tobacco: Never   Tobacco comments:    Patient does vape occasionally.  No nicotine  Vaping Use   Vaping status: Never Used  Substance Use Topics   Alcohol use: Yes    Alcohol/week: 2.0 - 4.0 standard drinks of alcohol    Types: 2 - 4 Cans of beer per week    Comment: weekly   Drug use: Yes    Frequency: 1.0 times per week    Types: Marijuana    Comment: Last use 11/05/22    Family History  Problem Relation Age of Onset   Arthritis Mother    Mental illness Mother    Depression Mother    Anxiety disorder Mother    Drug abuse Maternal Grandfather    Alcohol abuse Maternal Grandfather    Drug abuse Maternal Grandmother    Alcohol abuse Maternal Grandmother     Allergies  Allergen Reactions   Codeine Rash    Medication list has been reviewed and updated.  Current Outpatient Medications on File Prior to Visit  Medication Sig Dispense Refill   BELSOMRA 10 MG TABS Take 1 tablet  by mouth at bedtime.     clonazePAM (KLONOPIN) 1 MG tablet Take 1 mg by mouth 3 (three) times daily as needed for anxiety.     gabapentin (NEURONTIN) 100 MG capsule Take 100 mg by mouth 3 (three) times daily.     lamoTRIgine (LAMICTAL) 25 MG tablet Take 25 mg by mouth daily.     lithium carbonate (LITHOBID) 300 MG CR tablet Take 1,200 mg by mouth at bedtime.     prazosin (MINIPRESS) 1 MG capsule Take 1 mg by mouth at bedtime.     rosuvastatin (CRESTOR) 5 MG tablet Take 1 tablet (5 mg total) by mouth daily. 90 tablet 0   Suvorexant (BELSOMRA PO) Take by mouth.     VYVANSE 20 MG capsule Take 20 mg by mouth daily.     No current facility-administered medications on file prior to visit.    Review of Systems:  As per HPI- otherwise negative.   Physical Examination: There were no  vitals filed for this visit. There were no vitals filed for this visit. There is no height or weight on file to calculate BMI. Ideal Body Weight:    GEN: no acute distress. HEENT: Atraumatic, Normocephalic.  Ears and Nose: No external deformity. CV: RRR, No M/G/R. No JVD. No thrill. No extra heart sounds. PULM: CTA B, no wheezes, crackles, rhonchi. No retractions. No resp. distress. No accessory muscle use. ABD: S, NT, ND, +BS. No rebound. No HSM. EXTR: No c/c/e PSYCH: Normally interactive. Conversant.    Assessment and Plan: ***  Signed Abbe Amsterdam, MD

## 2023-08-12 DIAGNOSIS — F3181 Bipolar II disorder: Secondary | ICD-10-CM | POA: Diagnosis not present

## 2023-08-13 DIAGNOSIS — F439 Reaction to severe stress, unspecified: Secondary | ICD-10-CM | POA: Diagnosis not present

## 2023-08-13 DIAGNOSIS — F331 Major depressive disorder, recurrent, moderate: Secondary | ICD-10-CM | POA: Diagnosis not present

## 2023-08-13 DIAGNOSIS — F411 Generalized anxiety disorder: Secondary | ICD-10-CM | POA: Diagnosis not present

## 2023-08-14 ENCOUNTER — Ambulatory Visit: Payer: BC Managed Care – PPO | Admitting: Family Medicine

## 2023-08-15 ENCOUNTER — Ambulatory Visit (HOSPITAL_COMMUNITY): Payer: BC Managed Care – PPO | Attending: Cardiology

## 2023-08-15 DIAGNOSIS — R002 Palpitations: Secondary | ICD-10-CM | POA: Insufficient documentation

## 2023-08-15 DIAGNOSIS — R0602 Shortness of breath: Secondary | ICD-10-CM | POA: Insufficient documentation

## 2023-08-15 LAB — ECHOCARDIOGRAM COMPLETE
Area-P 1/2: 3.68 cm2
S' Lateral: 3.2 cm

## 2023-08-16 DIAGNOSIS — F3181 Bipolar II disorder: Secondary | ICD-10-CM | POA: Diagnosis not present

## 2023-08-19 ENCOUNTER — Encounter (HOSPITAL_BASED_OUTPATIENT_CLINIC_OR_DEPARTMENT_OTHER): Payer: Self-pay | Admitting: Internal Medicine

## 2023-08-19 DIAGNOSIS — F439 Reaction to severe stress, unspecified: Secondary | ICD-10-CM | POA: Diagnosis not present

## 2023-08-19 DIAGNOSIS — F331 Major depressive disorder, recurrent, moderate: Secondary | ICD-10-CM | POA: Diagnosis not present

## 2023-08-19 DIAGNOSIS — G4700 Insomnia, unspecified: Secondary | ICD-10-CM

## 2023-08-19 DIAGNOSIS — R0683 Snoring: Secondary | ICD-10-CM

## 2023-08-19 DIAGNOSIS — F411 Generalized anxiety disorder: Secondary | ICD-10-CM | POA: Diagnosis not present

## 2023-08-19 NOTE — Progress Notes (Deleted)
[facility] [address] [place], [place] [address] [phone]  Date:  08/21/2023   Name:  Alexander Harrington   DOB:  1990-12-22   MRN:  [id number]  PCP:  Pearline Cables, MD    Chief Complaint: No chief complaint on file.   History of Present Illness:  Alexander Harrington is a 33 y.o. very pleasant male patient who presents with the following:  Pt seen today for recheck visit  Last seen by myself in October  history of mood disorder, hypothyroidism and hyperlipidemia, sleep apnea    [name] has long struggled with depression and suicidality, his mother passed away in 2022-09-06 which has been really hard  Lab Results  Component Value Date   HGBA1C 5.0 06/04/2022   Lab Results  Component Value Date   TSH 0.98 05/01/2023    Patient Active Problem List   Diagnosis Date Noted   Suicide attempt (HCC) 11/17/2022   OSA (obstructive sleep apnea) 11/07/2022   Hyperlipidemia 06/03/2020   Bipolar I disorder, most recent episode depressed (HCC) 08/07/2017    Class: Chronic   Hoarseness of voice 10/04/2015   Tachycardia 08/30/2015   Leukocytosis 02/08/2015   Other specified hypothyroidism 02/08/2015   MDD (major depressive disorder), recurrent episode, severe (HCC) 12/01/2013   Substance induced mood disorder (HCC) 04/11/2012    Class: Acute   Severe episode of recurrent major depressive disorder (HCC) 04/09/2012    Class: Acute   Keratosis pilaris 02/21/2011    Past Medical History:  Diagnosis Date   ADHD    Anxiety    Dr. Evelene Croon   Bipolar 2 disorder (HCC)    Depression    HLD (hyperlipidemia)    Insomnia    Sleep apnea    do not use CPAP    Past Surgical History:  Procedure Laterality Date   DRUG INDUCED ENDOSCOPY N/A 08/28/2022   Procedure: DRUG INDUCED ENDOSCOPY;  Surgeon: Christia Reading, MD;  Location: [facility];  Service: ENT;  Laterality: N/A;   OTHER SURGICAL HISTORY     sedated ect treatment    TONSILLECTOMY Bilateral 11/07/2022   Procedure: TONSILLECTOMY;  Surgeon: Christia Reading, MD;  Location: [facility] OR;  Service: ENT;  Laterality: Bilateral;    Social History   Tobacco Use   Smoking status: Never   Smokeless tobacco: Never   Tobacco comments:    Patient does vape occasionally.  No nicotine  Vaping Use   Vaping status: Never Used  Substance Use Topics   Alcohol use: Yes    Alcohol/week: 2.0 - 4.0 standard drinks of alcohol    Types: 2 - 4 Cans of beer per week    Comment: weekly   Drug use: Yes    Frequency: 1.0 times per week    Types: Marijuana    Comment: Last use 11/05/22    Family History  Problem Relation Age of Onset   Arthritis Mother    Mental illness Mother    Depression Mother    Anxiety disorder Mother    Drug abuse Maternal Grandfather    Alcohol abuse Maternal Grandfather    Drug abuse Maternal Grandmother    Alcohol abuse Maternal Grandmother     Allergies  Allergen Reactions   Codeine Rash    Medication list has been reviewed and updated.  Current Outpatient Medications on File Prior to Visit  Medication Sig Dispense Refill   BELSOMRA 10 MG TABS Take 1 tablet  by mouth at bedtime.     clonazePAM (KLONOPIN) 1 MG tablet Take 1 mg by mouth 3 (three) times daily as needed for anxiety.     gabapentin (NEURONTIN) 100 MG capsule Take 100 mg by mouth 3 (three) times daily.     lamoTRIgine (LAMICTAL) 25 MG tablet Take 25 mg by mouth daily.     lithium carbonate (LITHOBID) 300 MG CR tablet Take 1,200 mg by mouth at bedtime.     prazosin (MINIPRESS) 1 MG capsule Take 1 mg by mouth at bedtime.     rosuvastatin (CRESTOR) 5 MG tablet Take 1 tablet (5 mg total) by mouth daily. 90 tablet 0   Suvorexant (BELSOMRA PO) Take by mouth.     VYVANSE 20 MG capsule Take 20 mg by mouth daily.     No current facility-administered medications on file prior to visit.    Review of Systems:  As per HPI- otherwise negative.   Physical Examination: There were no  vitals filed for this visit. There were no vitals filed for this visit. There is no height or weight on file to calculate BMI. Ideal Body Weight:    GEN: no acute distress. HEENT: Atraumatic, Normocephalic.  Ears and Nose: No external deformity. CV: RRR, No M/G/R. No JVD. No thrill. No extra heart sounds. PULM: CTA B, no wheezes, crackles, rhonchi. No retractions. No resp. distress. No accessory muscle use. ABD: S, NT, ND, +BS. No rebound. No HSM. EXTR: No c/c/e PSYCH: Normally interactive. Conversant.    Assessment and Plan: ***  Signed Abbe Amsterdam, MD

## 2023-08-20 ENCOUNTER — Telehealth: Payer: Self-pay

## 2023-08-20 DIAGNOSIS — F3181 Bipolar II disorder: Secondary | ICD-10-CM | POA: Diagnosis not present

## 2023-08-20 NOTE — Telephone Encounter (Signed)
-----   Message from [facility] sent at 08/19/2023  5:17 PM EST ----- Please let Mr. Worth know that his echocardiogram showed normal heart squeeze and function. There are no significant valvular abnormalities. Good results!

## 2023-08-20 NOTE — Telephone Encounter (Signed)
Left message to call back

## 2023-08-21 ENCOUNTER — Ambulatory Visit: Payer: BC Managed Care – PPO | Admitting: Family Medicine

## 2023-08-21 NOTE — Telephone Encounter (Signed)
Left message to call back

## 2023-08-22 NOTE — Telephone Encounter (Signed)
Pt dad is calling back for results

## 2023-08-22 NOTE — Telephone Encounter (Signed)
Called patient advised of below they verbalized understanding.

## 2023-08-23 DIAGNOSIS — F411 Generalized anxiety disorder: Secondary | ICD-10-CM | POA: Diagnosis not present

## 2023-08-23 DIAGNOSIS — F439 Reaction to severe stress, unspecified: Secondary | ICD-10-CM | POA: Diagnosis not present

## 2023-08-23 DIAGNOSIS — F331 Major depressive disorder, recurrent, moderate: Secondary | ICD-10-CM | POA: Diagnosis not present

## 2023-08-24 NOTE — Progress Notes (Deleted)
 [facility] [address] [place], [place] [address] [phone]  Date:  08/28/2023   Name:  Alexander Harrington   DOB:  Jan 09, 1991   MRN:  [id number]  PCP:  Watt Harlene BROCKS, MD    Chief Complaint: No chief complaint on file.   History of Present Illness:  Alexander Harrington is a 33 y.o. very pleasant male patient who presents with the following:  Pt seen today for recheck visit  Last seen by myself in October  history of mood disorder, hypothyroidism and hyperlipidemia, sleep apnea    Alexander Harrington has long struggled with depression and suicidality, his mother passed away in Jul 17, 2023 which has been really hard  Lab Results  Component Value Date   HGBA1C 5.0 06/04/2022   Lab Results  Component Value Date   TSH 0.98 05/01/2023    Patient Active Problem List   Diagnosis Date Noted   Suicide attempt (HCC) 11/17/2022   OSA (obstructive sleep apnea) 11/07/2022   Hyperlipidemia 06/03/2020   Bipolar I disorder, most recent episode depressed (HCC) 08/07/2017    Class: Chronic   Hoarseness of voice 10/04/2015   Tachycardia 08/30/2015   Leukocytosis 02/08/2015   Other specified hypothyroidism 02/08/2015   MDD (major depressive disorder), recurrent episode, severe (HCC) 12/01/2013   Substance induced mood disorder (HCC) 04/11/2012    Class: Acute   Severe episode of recurrent major depressive disorder (HCC) 04/09/2012    Class: Acute   Keratosis pilaris 02/21/2011    Past Medical History:  Diagnosis Date   ADHD    Anxiety    Dr. [name]   Bipolar 2 disorder (HCC)    Depression    HLD (hyperlipidemia)    Insomnia    Sleep apnea    do not use CPAP    Past Surgical History:  Procedure Laterality Date   DRUG INDUCED ENDOSCOPY N/A 08/28/2022   Procedure: DRUG INDUCED ENDOSCOPY;  Surgeon: Alexander Clark, MD;  Location: [facility];  Service: ENT;  Laterality: N/A;   OTHER SURGICAL HISTORY     sedated ect treatment    TONSILLECTOMY Bilateral 11/07/2022   Procedure: TONSILLECTOMY;  Surgeon: Alexander Clark, MD;  Location: [facility] OR;  Service: ENT;  Laterality: Bilateral;    Social History   Tobacco Use   Smoking status: Never   Smokeless tobacco: Never   Tobacco comments:    Patient does vape occasionally.  No nicotine   Vaping Use   Vaping status: Never Used  Substance Use Topics   Alcohol  use: Yes    Alcohol /week: 2.0 - 4.0 standard drinks of alcohol     Types: 2 - 4 Cans of beer per week    Comment: weekly   Drug use: Yes    Frequency: 1.0 times per week    Types: Marijuana    Comment: Last use 11/05/22    Family History  Problem Relation Age of Onset   Arthritis Mother    Mental illness Mother    Depression Mother    Anxiety disorder Mother    Drug abuse Maternal Grandfather    Alcohol  abuse Maternal Grandfather    Drug abuse Maternal Grandmother    Alcohol  abuse Maternal Grandmother     Allergies  Allergen Reactions   Codeine Rash    Medication list has been reviewed and updated.  Current Outpatient Medications on File Prior to Visit  Medication Sig Dispense Refill   BELSOMRA 10 MG TABS Take 1 tablet  by mouth at bedtime.     clonazePAM  (KLONOPIN ) 1 MG tablet Take 1 mg by mouth 3 (three) times daily as needed for anxiety.     gabapentin  (NEURONTIN ) 100 MG capsule Take 100 mg by mouth 3 (three) times daily.     lamoTRIgine  (LAMICTAL ) 25 MG tablet Take 25 mg by mouth daily.     lithium  carbonate (LITHOBID ) 300 MG CR tablet Take 1,200 mg by mouth at bedtime.     prazosin (MINIPRESS) 1 MG capsule Take 1 mg by mouth at bedtime.     rosuvastatin  (CRESTOR ) 5 MG tablet Take 1 tablet (5 mg total) by mouth daily. 90 tablet 0   Suvorexant (BELSOMRA PO) Take by mouth.     VYVANSE 20 MG capsule Take 20 mg by mouth daily.     No current facility-administered medications on file prior to visit.    Review of Systems:  As per HPI- otherwise negative.   Physical Examination: There were no  vitals filed for this visit. There were no vitals filed for this visit. There is no height or weight on file to calculate BMI. Ideal Body Weight:    GEN: no acute distress. HEENT: Atraumatic, Normocephalic.  Ears and Nose: No external deformity. CV: RRR, No M/G/R. No JVD. No thrill. No extra heart sounds. PULM: CTA B, no wheezes, crackles, rhonchi. No retractions. No resp. distress. No accessory muscle use. ABD: S, NT, ND, +BS. No rebound. No HSM. EXTR: No c/c/e PSYCH: Normally interactive. Conversant.    Assessment and Plan: ***  Signed Harlene Schroeder, MD

## 2023-08-26 DIAGNOSIS — F3181 Bipolar II disorder: Secondary | ICD-10-CM | POA: Diagnosis not present

## 2023-08-28 ENCOUNTER — Ambulatory Visit: Payer: BC Managed Care – PPO | Admitting: Family Medicine

## 2023-08-29 DIAGNOSIS — F3181 Bipolar II disorder: Secondary | ICD-10-CM | POA: Diagnosis not present

## 2023-09-02 DIAGNOSIS — F439 Reaction to severe stress, unspecified: Secondary | ICD-10-CM | POA: Diagnosis not present

## 2023-09-02 DIAGNOSIS — F331 Major depressive disorder, recurrent, moderate: Secondary | ICD-10-CM | POA: Diagnosis not present

## 2023-09-02 DIAGNOSIS — F411 Generalized anxiety disorder: Secondary | ICD-10-CM | POA: Diagnosis not present

## 2023-09-03 DIAGNOSIS — F3181 Bipolar II disorder: Secondary | ICD-10-CM | POA: Diagnosis not present

## 2023-09-09 DIAGNOSIS — F3181 Bipolar II disorder: Secondary | ICD-10-CM | POA: Diagnosis not present

## 2023-09-10 DIAGNOSIS — F331 Major depressive disorder, recurrent, moderate: Secondary | ICD-10-CM | POA: Diagnosis not present

## 2023-09-10 DIAGNOSIS — F411 Generalized anxiety disorder: Secondary | ICD-10-CM | POA: Diagnosis not present

## 2023-09-10 DIAGNOSIS — F439 Reaction to severe stress, unspecified: Secondary | ICD-10-CM | POA: Diagnosis not present

## 2023-09-12 DIAGNOSIS — F3181 Bipolar II disorder: Secondary | ICD-10-CM | POA: Diagnosis not present
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
## 2023-09-14 NOTE — Patient Instructions (Incomplete)
 It was good to see you again today! Recommend COVID booster if none the last 6 months or so I will be in touch with your labs Try the imitrex for migraine headache- try to take 50mg  at the first sign of headache, can take another 50 mg if not feeling better in 1-2 hours. Let me know how this works for you

## 2023-09-16 DIAGNOSIS — F411 Generalized anxiety disorder: Secondary | ICD-10-CM | POA: Diagnosis not present

## 2023-09-16 DIAGNOSIS — F439 Reaction to severe stress, unspecified: Secondary | ICD-10-CM | POA: Diagnosis not present

## 2023-09-16 DIAGNOSIS — F331 Major depressive disorder, recurrent, moderate: Secondary | ICD-10-CM | POA: Diagnosis not present

## 2023-09-18 ENCOUNTER — Ambulatory Visit (INDEPENDENT_AMBULATORY_CARE_PROVIDER_SITE_OTHER): Payer: BC Managed Care – PPO | Admitting: Family Medicine

## 2023-09-18 VITALS — BP 122/82 | HR 92 | Temp 97.8°F | Resp 18 | Ht 70.0 in | Wt 217.6 lb

## 2023-09-18 DIAGNOSIS — Z13220 Encounter for screening for lipoid disorders: Secondary | ICD-10-CM

## 2023-09-18 DIAGNOSIS — G43009 Migraine without aura, not intractable, without status migrainosus: Secondary | ICD-10-CM

## 2023-09-18 DIAGNOSIS — Z131 Encounter for screening for diabetes mellitus: Secondary | ICD-10-CM

## 2023-09-18 DIAGNOSIS — Z5181 Encounter for therapeutic drug level monitoring: Secondary | ICD-10-CM

## 2023-09-18 MED ORDER — SUMATRIPTAN SUCCINATE 50 MG PO TABS: 50.0000 mg | ORAL_TABLET | ORAL | 1 refills | Status: DC | PRN

## 2023-09-19 ENCOUNTER — Encounter: Payer: Self-pay | Admitting: Family Medicine

## 2023-09-19 DIAGNOSIS — G43009 Migraine without aura, not intractable, without status migrainosus: Secondary | ICD-10-CM

## 2023-09-19 DIAGNOSIS — E785 Hyperlipidemia, unspecified: Secondary | ICD-10-CM

## 2023-09-19 LAB — LITHIUM LEVEL: Lithium Lvl: 0.4 mmol/L — ABNORMAL LOW (ref 0.6–1.2)

## 2023-09-19 LAB — LIPID PANEL
Cholesterol: 210 mg/dL — ABNORMAL HIGH (ref 0–200)
HDL: 40.9 mg/dL (ref >39.00)
LDL Cholesterol: 123 mg/dL — ABNORMAL HIGH (ref 0–99)
NonHDL: 169.03
Total CHOL/HDL Ratio: 5
Triglycerides: 231 mg/dL — ABNORMAL HIGH (ref 0.0–149.0)
VLDL: 46.2 mg/dL — ABNORMAL HIGH (ref 0.0–40.0)

## 2023-09-19 LAB — HEMOGLOBIN A1C: Hgb A1c MFr Bld: 4.9 % (ref 4.6–6.5)

## 2023-09-24 MED ORDER — ROSUVASTATIN CALCIUM 10 MG PO TABS: 10.0000 mg | ORAL_TABLET | Freq: Every day | ORAL | 3 refills | Status: AC

## 2023-09-24 MED ORDER — SUMATRIPTAN SUCCINATE 50 MG PO TABS
50.0000 mg | ORAL_TABLET | ORAL | 2 refills | Status: DC | PRN
Start: 2023-09-24 — End: 2023-12-31

## 2023-10-17 ENCOUNTER — Ambulatory Visit (HOSPITAL_BASED_OUTPATIENT_CLINIC_OR_DEPARTMENT_OTHER): Payer: BC Managed Care – PPO | Attending: Otolaryngology | Admitting: Internal Medicine

## 2023-10-17 DIAGNOSIS — G4700 Insomnia, unspecified: Secondary | ICD-10-CM | POA: Diagnosis not present

## 2023-10-17 DIAGNOSIS — R0683 Snoring: Secondary | ICD-10-CM

## 2023-10-21 ENCOUNTER — Encounter (HOSPITAL_BASED_OUTPATIENT_CLINIC_OR_DEPARTMENT_OTHER): Payer: Self-pay

## 2023-10-21 NOTE — Progress Notes (Deleted)
  Cardiology Office Note:  .   Date:  10/21/2023  ID:  Alexander Harrington, DOB Aug 17, 1990, MRN [id number] PCP: Pearline Cables, MD  [facility] Providers Cardiologist:  Reatha Harps, MD { Click to update primary MD,subspecialty MD or APP then REFRESH:1}   History of Present Illness: .   No chief complaint on file.   Alexander Harrington is a 33 y.o. male with history of bipolar disorder, OSA who presents for follow-up. Negative cardiac work-up for weakness/fatigue.       Problem List OSA Bipolar disorder  HLD    ROS: All other ROS reviewed and negative. Pertinent positives noted in the HPI.     Studies Reviewed: Marland Kitchen        TTE 08/15/2023  1. Left ventricular ejection fraction, by estimation, is 55 to 60%. Left  ventricular ejection fraction by 3D volume is 55 %. The left ventricle has  normal function. The left ventricle has no regional wall motion  abnormalities. Left ventricular diastolic   parameters were normal. The average left ventricular global longitudinal  strain is -30.3 %. The global longitudinal strain is normal.   2. Right ventricular systolic function is normal. The right ventricular  size is normal. Tricuspid regurgitation signal is inadequate for assessing  PA pressure.   3. The mitral valve is normal in structure. No evidence of mitral valve  regurgitation. No evidence of mitral stenosis.   4. The aortic valve is tricuspid. Aortic valve regurgitation is not  visualized. No aortic stenosis is present.   5. The inferior vena cava is normal in size with greater than 50%  respiratory variability, suggesting right atrial pressure of 3 mmHg.   [facility] 10/17/2022 Impression: No arrhythmias detected.  Rare ectopy.  Physical Exam:   VS:  There were no vitals taken for this visit.   Wt Readings from Last 3 Encounters:  10/17/23 210 lb (95.3 kg)  09/18/23 217 lb 9.6 oz (98.7 kg)  07/11/23 219 lb (99.3 kg)    GEN: Well nourished, well developed in no acute  distress NECK: No JVD; No carotid bruits CARDIAC: ***RRR, no murmurs, rubs, gallops RESPIRATORY:  Clear to auscultation without rales, wheezing or rhonchi  ABDOMEN: Soft, non-tender, non-distended EXTREMITIES:  No edema; No deformity  ASSESSMENT AND PLAN: .   ***    {Are you ordering a CV Procedure (e.g. stress test, cath, DCCV, TEE, etc)?   Press F2        :[id number]}   Follow-up: No follow-ups on file.  Time Spent with Patient: I have spent a total of *** minutes caring for this patient today face to face, ordering and reviewing labs/tests, reviewing prior records/medical history, examining the patient, establishing an assessment and plan, communicating results/findings to the patient/family, and documenting in the medical record.   Signed, Lenna Gilford. Flora Lipps, MD, [facility]  [facility]  [address], [address] [place], [place] [address] [phone]  8:45 PM

## 2023-10-23 ENCOUNTER — Ambulatory Visit: Payer: BC Managed Care – PPO | Attending: Cardiovascular Disease | Admitting: Cardiovascular Disease

## 2023-10-23 DIAGNOSIS — R531 Weakness: Secondary | ICD-10-CM

## 2023-10-23 DIAGNOSIS — R002 Palpitations: Secondary | ICD-10-CM

## 2023-10-23 DIAGNOSIS — R42 Dizziness and giddiness: Secondary | ICD-10-CM

## 2023-10-26 DIAGNOSIS — R0683 Snoring: Secondary | ICD-10-CM | POA: Diagnosis not present

## 2023-10-26 NOTE — Procedures (Signed)
 [facility] [address] [place], [place] [address] Tel: [phone]   Fax: [phone]  Polysomnography Interpretation  Patient Name:  Alexander Harrington, Alexander Harrington Date:  10/17/2023 Referring Physician:  Dr. Christia Reading  Indications for Polysomnography The patient is a 33 year old Male who is 5\' 10"  and weighs 210.0 lbs. His BMI equals 30.4.  A full night polysomnogram was performed to evaluate for -.OSA  Medications taken at 2130.  PRAZOSIN  LITHIUM  LAMICTAL  ESZOPICLONE (2mg ) TAKEN AT 2130 AND 2200   Polysomnogram Data A full night polysomnogram recorded the standard physiologic parameters including EEG, EOG, EMG, EKG, nasal and oral airflow.  Respiratory parameters of chest and abdominal movements were recorded with Respiratory Inductance Plethysmography belts.  Oxygen saturation was recorded by pulse oximetry.   Sleep Architecture The total recording time of the polysomnogram was 375.0 minutes.  The total sleep time was 112.0 minutes.  The patient spent 15.2% of total sleep time in Stage N1, 82.1% in Stage N2, 0.0% in Stages N3, and 2.7% in REM.  Sleep latency was 113.6 minutes.  REM latency was 123.0 minutes.  Sleep Efficiency was 29.9%.  Wake after Sleep Onset time was 149.0 minutes.  Respiratory Events The polysomnogram revealed a presence of - obstructive, - central, and - mixed apneas resulting in an Apnea index of - events per hour.  There were 6 hypopneas (>=3% desaturation and/or arousal) resulting in an Apnea\Hypopnea Index (AHI >=3% desaturation and/or arousal) of 3.2 events per hour.  There were 5 hypopneas (>=4% desaturation) resulting in an Apnea\Hypopnea Index (AHI >=4% desaturation) of 2.7 events per hour.  There were 1 Respiratory Effort Related Arousals resulting in a RERA index of 0.5 events per hour. The Respiratory Disturbance Index is 3.8 events per hour.  The snore index was 285.0 events per hour.  Mean oxygen saturation  was 94.0%.  The lowest oxygen saturation during sleep was 88.0%.  Time spent <=88% oxygen saturation was 3.6 minutes (1.0%).  End Tidal CO2 during sleep ranged from - to - mmHg. End Tidal CO2 was greater than 50 mmHg for - minutes and greater than 55 mmHg for - minutes.  Limb Activity There were - total limb movements recorded, of this total, - were classified as PLMs.  PLM index was - per hour and PLM associated with Arousals index was - per hour.  Cardiac Summary The average pulse rate was 78.6 bpm.  The minimum pulse rate was 51.0 bpm while the maximum pulse rate was 139.0 bpm.  Cardiac rhythm was normal/abnormal.  Comment: Occasional apneas and hypopneas, within normal limits, AHI (4%) 2.7/hr. Moderate snoring with oxygen desaturation to a nadir of 88% and mean 94%. Significant difficulty initiating and maintaining sleep, despite bedtime medications including total of 4 mg eszopiclone.  Diagnosis: Primary Snoring    Insomnia  Recommendations: Manage for snoring and symptoms based on clinical judgment.   This study was personally reviewed and electronically signed by: Dr. Jetty Duhamel Accredited Board Certified in Sleep Medicine Date/Time: 10/26/23   1:00        Diagnostic PSG Report  Patient Name: Alexander Harrington, Alexander Harrington Study Date: 10/17/2023  Date of Birth: March 16, 1991 Study Type: Diagnostic  Age: 16 year MRN #: [id number]  Sex: Male Interpreting Physician: Jetty Duhamel [id number]  Height: 5\' 10"  Referring Physician: Dr. Christia Reading  Weight: 210.0 lbs Recording Tech: Shelah Lewandowsky CRT RPSGT RST  BMI: 30.4 Scoring Tech: Shelah Lewandowsky CRT RPSGT RST  ESS: 8 Neck Size:  16.5   Study Overview  Lights Off: 10:43:21 PM  Count Index  Lights On: 04:58:23 AM Awakenings: 15 8.0  Time in Bed: 375.0 min. Arousals: 16 8.6  Total Sleep Time: 112.0 min. AHI (>=3% Desat and/or Ar.): 6 3.2   Sleep Efficiency: 29.9% AHI (>=4% Desat): 5 2.7   Sleep Latency: 113.6 min. Limb Movements: - -   Wake After Sleep Onset: 149.0 min. Snore: 532 285.0  REM Latency from Sleep Onset: 123.0 min. Desaturations: 9 4.8     Minimum SpO2 TST: 88.0%    Sleep Architecture  % of Time in Bed Stages Time (mins) % Sleep Time  Wake 263.5   Stage N1 17.0 15.2%  Stage N2 92.0 82.1%  Stage N3 0.0 0.0%  REM 3.0 2.7%   Arousal Summary   NREM REM Sleep Index  Respiratory Arousals 2 - 2 1.1  PLM Arousals - - - -  Isolated Limb Movement Arousals - - - -  Snore Arousals 2 - 2 1.1  Spontaneous Arousals 12 - 12 6.4  Total 16 - 16 8.6   Limb Movement Summary   Count Index  Isolated Limb Movements - -  Periodic Limb Movements (PLMs) - -  Total Limb Movements - -  Respiratory Summary   By Sleep Stage By Body Position Total   NREM REM Supine Non-Supine   Time (min) 109.0 3.0 51.0 61.0 112.0         Obstructive Apnea - - - - -  Mixed Apnea - - - - -  Central Apnea - - - - -  Total Apneas - - - - -  Total Apnea Index - - - - -         Hypopneas (>=3% Desat and/or Ar.) 6 - 4 2 6   AHI (>=3% Desat and/or Ar.) 3.3 - 4.7 2.0 3.2         Hypopneas (>=4% Desat) 5 - 3 2 5   AHI (>=4% Desat) 2.8 - 3.5 2.0 2.7          RERAs 1 - - 1 1  RERA Index 0.6 - - 1.0 0.5         RDI 3.9 - 4.7 3.0 3.8    Respiratory Event Type Index  Central Apneas -  Obstructive Apneas -  Mixed Apneas -  Central Hypopneas -  Obstructive Hypopneas -  Central Apnea + Hypopnea (CAHI) -  Obstructive Apnea + Hypopnea (OAHI) 3.2   Respiratory Event Durations   Apnea Hypopnea   NREM REM NREM REM  Average (seconds) - - 27.3 -  Maximum (seconds) - - 44.9 -    Oxygen Saturation Summary   Wake NREM REM TST TIB  Average SpO2 (%) 94.7% 92.4% 90.6% 92.3% 94.0%  Minimum SpO2 (%) 83.0% 88.0% 89.0% 88.0% 83.0%  Maximum SpO2 (%) 99.0% 97.0% 92.0% 97.0% 99.0%   Oxygen Saturation Distribution  Range (%) Time in range (min) Time in range (%)  90.0 - 100.0 345.9 93.4%  80.0 - 90.0 24.3 6.6%  70.0 - 80.0 - -  60.0 -  70.0 - -  50.0 - 60.0 - -  0.0 - 50.0 - -  Time Spent <=88% SpO2  Range (%) Time in range (min) Time in range (%)  0.0 - 88.0 3.6 1.0%      Count Index  Desaturations 9 4.8    Cardiac Summary   Wake NREM REM Sleep Total  Average Pulse Rate (BPM) 83.1 69.0 62.6 68.8 78.6  Minimum  Pulse Rate (BPM) 51.0 58.0 57.0 57.0 51.0  Maximum Pulse Rate (BPM) 139.0 82.0 68.0 82.0 139.0   Pulse Rate Distribution:  Range (bpm) Time in range (min) Time in range (%)  0.0 - 40.0 - -  40.0 - 60.0 3.2 0.9%  60.0 - 80.0 219.6 59.2%  80.0 - 100.0 104.8 28.3%  100.0 - 120.0 28.9 7.8%  120.0 - 140.0 1.6 0.4%  140.0 - 200.0 - -   EtCO2 Summary  Stage Min (mmHg) Average (mmHg) Max (mmHg)  Wake - - -  NREM(1+2+3) - - -  REM - - -   EtCO2 Distribution:  Range (mmHg) Time in range (min) Time in range (%)  20.0 - 40.0 - -  40.0 - 50.0 - -  50.0 - 100.0 - -  55.0 - 100.0 - -  Excluded data <20.0 & >65.0 375.5 100.0%     Hypnograms                         Technologist Comments  THE 40-YEAR-OLD MALE PATIENT PRESENTED TO THE SLEEP DISORDER CENTER, ACCOMPANIED BY HIS FATHER, FOR A NPSG DIAGNOSTIC STUDY WITH CHIEF COMPLAINTS OF SNORING, WITNESSED APNEA AND INSOMNIA. ALL BEDTIME MEDICATIONS WERE SELF ADMINISTERED AT 2130. A SECOND ESZOPICLONE WAS TAKEN AT 2200 TO ASSIST WITH SLEEP ONSET. THE LEAD PLACEMENT WAS INITIATED, THEN THE STUDY WAS BEGUN. MODERATE SNORING WAS NOTED THROUGHOUT THE STUDY. SUPPLEMENTAL OXYGEN WAS NOT WARRANTED DURING THE STUDY. NO PLMs - PLMAs WERE NOTED. NO RESTROOM VISITS WERE MADE. NO OBVIOUS CARDIAC ARRHYTHMIAS, NOR PARASOMNIAS WERE OBSERVED. THE PATIENT TOLERATED THE NPSG STUDY FAIRLY CONSIDERING HIS EMOTIONAL STATE. -Tech                           [employer], Biomedical engineer of Sleep Medicine  ELECTRONICALLY SIGNED ON:  10/26/2023, 12:51 PM [facility] PH: [phone]   FX:  [phone] ACCREDITED BY THE AMERICAN ACADEMY OF SLEEP MEDICINE

## 2023-11-05 ENCOUNTER — Other Ambulatory Visit: Payer: Self-pay | Admitting: Family Medicine

## 2023-11-05 ENCOUNTER — Encounter: Payer: Self-pay | Admitting: Family Medicine

## 2023-11-05 DIAGNOSIS — E785 Hyperlipidemia, unspecified: Secondary | ICD-10-CM

## 2023-11-05 DIAGNOSIS — L905 Scar conditions and fibrosis of skin: Secondary | ICD-10-CM

## 2023-11-11 ENCOUNTER — Ambulatory Visit (INDEPENDENT_AMBULATORY_CARE_PROVIDER_SITE_OTHER): Admitting: Radiology

## 2023-11-11 ENCOUNTER — Ambulatory Visit
Admission: EM | Admit: 2023-11-11 | Discharge: 2023-11-11 | Disposition: A | Discharge status: Home / Self Care | Attending: Internal Medicine | Admitting: Internal Medicine

## 2023-11-11 ENCOUNTER — Encounter: Payer: Self-pay | Admitting: Emergency Medicine

## 2023-11-11 DIAGNOSIS — R0781 Pleurodynia: Secondary | ICD-10-CM | POA: Diagnosis not present

## 2023-11-11 DIAGNOSIS — R0782 Intercostal pain: Secondary | ICD-10-CM

## 2023-11-11 DIAGNOSIS — S301XXA Contusion of abdominal wall, initial encounter: Secondary | ICD-10-CM

## 2023-11-11 MED ORDER — BACLOFEN 10 MG PO TABS
10.0000 mg | ORAL_TABLET | Freq: Three times a day (TID) | ORAL | 0 refills | Status: AC
Start: 2023-11-11 — End: ?

## 2023-11-11 MED ORDER — IBUPROFEN 600 MG PO TABS: 600.0000 mg | ORAL_TABLET | Freq: Four times a day (QID) | ORAL | 0 refills | Status: AC | PRN
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
## 2023-12-24 DIAGNOSIS — R519 Headache, unspecified: Secondary | ICD-10-CM | POA: Diagnosis not present

## 2023-12-24 DIAGNOSIS — R110 Nausea: Secondary | ICD-10-CM | POA: Diagnosis not present

## 2023-12-31 ENCOUNTER — Encounter: Payer: Self-pay | Admitting: Family Medicine

## 2023-12-31 DIAGNOSIS — G43009 Migraine without aura, not intractable, without status migrainosus: Secondary | ICD-10-CM

## 2023-12-31 MED ORDER — SUMATRIPTAN SUCCINATE 50 MG PO TABS
50.0000 mg | ORAL_TABLET | ORAL | 2 refills | Status: AC | PRN
Start: 2023-12-31 — End: ?

## 2024-01-06 ENCOUNTER — Telehealth: Payer: Self-pay

## 2024-01-06 ENCOUNTER — Other Ambulatory Visit (HOSPITAL_COMMUNITY): Payer: Self-pay

## 2024-01-06 ENCOUNTER — Ambulatory Visit: Admitting: Family Medicine

## 2024-01-06 ENCOUNTER — Encounter: Payer: Self-pay | Admitting: Family Medicine

## 2024-01-06 VITALS — BP 132/80 | HR 84 | Temp 98.0°F | Resp 16 | Ht 70.0 in | Wt 209.0 lb

## 2024-01-06 DIAGNOSIS — G43809 Other migraine, not intractable, without status migrainosus: Secondary | ICD-10-CM

## 2024-01-06 DIAGNOSIS — G43909 Migraine, unspecified, not intractable, without status migrainosus: Secondary | ICD-10-CM | POA: Insufficient documentation

## 2024-01-06 MED ORDER — RIZATRIPTAN BENZOATE 10 MG PO TABS
10.0000 mg | ORAL_TABLET | ORAL | 0 refills | Status: AC | PRN
Start: 2024-01-06 — End: ?

## 2024-01-06 NOTE — Telephone Encounter (Signed)
 Pharmacy Patient Advocate Encounter  Received notification from [facility] that Prior Authorization for Rizatriptan Benzoate 10MG  tablets has been APPROVED. Unable to obtain price due to refill too soon rejection, last fill date 12/31/2023 next available fill date 01/22/2024 QTY OF 12.000 IN 022 DAYS QUANTITY REMAINING .000NEXT FILL DT

## 2024-01-06 NOTE — Patient Instructions (Signed)
 Consider magnesium  200-400 mg daily.   Let us  know if you need anything.

## 2024-01-06 NOTE — Progress Notes (Signed)
 Chief Complaint  Patient presents with   Migraine    Migraine       Alexander Harrington is a 33 y.o. male here for evaluation of migraines.  He is here with his dad.  Duration: several months Laterality: bilateral Quality: throbbing Associated symptoms: light sensitivity, mental fogginess, nausea Therapies tried: ice, NSAIDs, Imitrex  Imitrex  has not been helpful. >10 d per month having headache.  Facial pressure as his aura.  Has been having more stress lately.   Past Medical History:  Diagnosis Date   ADHD    Anxiety    Dr. Deborra Falter   Bipolar 2 disorder (HCC)    Depression    HLD (hyperlipidemia)    Insomnia    Sleep apnea    do not use CPAP   BP 132/80 (BP Location: Left Arm, Patient Position: Sitting)   Pulse 84   Temp 98 F (36.7 C) (Oral)   Resp 16   Ht 5' 10 (1.778 m)   Wt 209 lb (94.8 kg)   SpO2 99%   BMI 29.99 kg/m  Gen: awake, alert, appearing stated age Eyes: PERRLA, EOMi, no injection Lungs: no accessory muscle use Abd: BS+, soft, NT, ND Neuro: CN2-12 grossly intact, fluent and goal-oriented speech, DTR's equal and symmetric in UE's and LE's MSK: 5/5 strength throughout, no TTP over cervical paraspinal musculature or occipital triangle region Psych: Age appropriate judgment and insight, normal affect and mood  Other migraine without status migrainosus, not intractable - Plan: rizatriptan (MAXALT) 10 MG tablet  Chronic, not controlled.  No red flag signs/symptoms.  Start Mg 200-400 mg/d. Change Imitrex  to Maxalt 10 mg every day prn.  Take at onset of aura.  Would consider Nurtec or Ubrelvy if still not helpful. F/u in 1 mo w Dr. Geralyn Knee. The pt and his dad voiced understanding and agreement to the plan.  Shellie Dials [place], DO 01/06/24 3:04 PM

## 2024-01-07 ENCOUNTER — Other Ambulatory Visit (HOSPITAL_COMMUNITY): Payer: Self-pay
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
## 2024-01-27 ENCOUNTER — Ambulatory Visit: Admitting: Family Medicine
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
## 2024-02-24 DIAGNOSIS — F312 Bipolar disorder, current episode manic severe with psychotic features: Secondary | ICD-10-CM | POA: Diagnosis not present

## 2024-02-24 DIAGNOSIS — Z79899 Other long term (current) drug therapy: Secondary | ICD-10-CM | POA: Diagnosis not present

## 2024-02-25 NOTE — Procedures (Signed)
 SABRA

## 2024-02-26 ENCOUNTER — Other Ambulatory Visit (HOSPITAL_COMMUNITY)
Admission: RE | Admit: 2024-02-26 | Discharge: 2024-02-26 | Disposition: A | Discharge status: Home / Self Care | Source: Ambulatory Visit | Attending: Family Medicine | Admitting: Family Medicine

## 2024-02-26 ENCOUNTER — Ambulatory Visit: Admitting: Family Medicine

## 2024-02-26 VITALS — BP 130/70 | HR 94 | Ht 70.0 in | Wt 198.0 lb

## 2024-02-26 DIAGNOSIS — Z113 Encounter for screening for infections with a predominantly sexual mode of transmission: Secondary | ICD-10-CM | POA: Diagnosis not present

## 2024-02-26 NOTE — Patient Instructions (Signed)
 It was good to see you today- I am so glad you are home safely!  Please stop by the lab on the way out to have your blood drawn .  I will be in touch with your labs asap

## 2024-02-27 ENCOUNTER — Encounter: Payer: Self-pay | Admitting: Family Medicine

## 2024-02-27 LAB — URINE CYTOLOGY ANCILLARY ONLY
Chlamydia: NEGATIVE
Comment: NEGATIVE
Comment: NORMAL
Neisseria Gonorrhea: NEGATIVE

## 2024-02-27 LAB — HIV ANTIBODY (ROUTINE TESTING W REFLEX): HIV 1&2 Ab, 4th Generation: NONREACTIVE

## 2024-02-27 LAB — HEPATITIS C ANTIBODY: Hepatitis C Ab: NONREACTIVE

## 2024-02-27 LAB — HSV(HERPES SIMPLEX VRS) I + II AB-IGG
HSV 1 IGG,TYPE SPECIFIC AB: <0.9 {index}
HSV 2 IGG,TYPE SPECIFIC AB: <0.9 {index}

## 2024-02-27 LAB — HEPATITIS B SURFACE ANTIBODY, QUANTITATIVE: Hep B S AB Quant (Post): 18 m[IU]/mL (ref >=10)

## 2024-02-27 LAB — RPR: RPR Ser Ql: NONREACTIVE

## 2024-02-27 LAB — HEPATITIS B SURFACE ANTIGEN: Hepatitis B Surface Ag: NONREACTIVE

## 2024-03-02 ENCOUNTER — Telehealth (HOSPITAL_COMMUNITY): Payer: Self-pay | Admitting: Professional

## 2024-03-02 NOTE — Telephone Encounter (Signed)
 See call log

## 2024-03-03 ENCOUNTER — Telehealth (HOSPITAL_COMMUNITY): Payer: Self-pay | Admitting: Licensed Clinical Social Worker

## 2024-03-03 NOTE — Telephone Encounter (Signed)
 Cln returned call. Pt requested to be scheduled for PHP. Cln informed pt that PHP will be going back to in-person soon but we are unsure of the exact timeline. Cln advised that assessments are on hold for the time being but assured pt that the timeline should be finalized within a couple of days and it will likely start within 2 weeks. Pt agreeable to in-person PHP and verbalized understanding of the need to push his CCA. Cln advised that he should be contacted by the end of the week and pt verbalized understanding. Pt denied SI.

## 2024-03-05 ENCOUNTER — Telehealth (HOSPITAL_COMMUNITY): Payer: Self-pay | Admitting: Licensed Clinical Social Worker
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
## 2024-03-10 ENCOUNTER — Telehealth (HOSPITAL_COMMUNITY): Payer: Self-pay | Admitting: Licensed Clinical Social Worker

## 2024-03-10 ENCOUNTER — Ambulatory Visit (INDEPENDENT_AMBULATORY_CARE_PROVIDER_SITE_OTHER): Admitting: Licensed Clinical Social Worker

## 2024-03-10 DIAGNOSIS — Z5181 Encounter for therapeutic drug level monitoring: Secondary | ICD-10-CM | POA: Diagnosis not present

## 2024-03-10 DIAGNOSIS — F3130 Bipolar disorder, current episode depressed, mild or moderate severity, unspecified: Secondary | ICD-10-CM | POA: Diagnosis not present

## 2024-03-10 NOTE — Telephone Encounter (Signed)
 Cln called to inform pt that after staffing with PHP team, it was determined he can start on 9/2. Cln reviewed paperwork briefly and advised that cln will send pt referrals for [facility] trauma therapists. Pt states he is fine with [facility] options as well and is seeking EMDR and IFS. Pt provides email address: [email or web address].
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
## 2024-03-10 NOTE — Progress Notes (Signed)
   03/10/24 1322  Patient-Centered Profile  PCP Completed On 03/10/24  What People Like and Admire About? "They say I'm funny, kind, and smart."  What's Important to? "Establishing some sense of normalcy and routine, and trying to connect with some people and getting some kind of plan going. Just building some stability and having some people who understand who can listen and learn from other people as well, and to feel like I have some social support as well because I don't have my friends anymore."  How Best to Support? Patient will engage in PHP Monday-Friday from 9 am - 2 pm for approximately three weeks and then step down to outpatient therapy and medication management.  Add What's Working / [employer] Not Working? "Just the illnesses and the feeling that I can't forgive myself for anything. That something is broken in me and that feeling like it's too late. And that momentum, feeling like I'm dead in the water and it's making me feel a lot more depressed."  PCP Action Plan  Long Range Outcome Patient will report increased ability to manage depression and anxiety symptoms using coping skills at least 3x a day. Pt will report decrease in passive SI to 0 a week. Pt will report increased ability to catch, challenge, change cognitive distortions at least 3x a day.
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
## 2024-03-12 DIAGNOSIS — F422 Mixed obsessional thoughts and acts: Secondary | ICD-10-CM | POA: Diagnosis not present

## 2024-03-12 DIAGNOSIS — F410 Panic disorder [episodic paroxysmal anxiety] without agoraphobia: Secondary | ICD-10-CM | POA: Diagnosis not present

## 2024-03-12 DIAGNOSIS — F3113 Bipolar disorder, current episode manic without psychotic features, severe: Secondary | ICD-10-CM | POA: Diagnosis not present

## 2024-03-17 DIAGNOSIS — F332 Major depressive disorder, recurrent severe without psychotic features: Secondary | ICD-10-CM | POA: Diagnosis not present

## 2024-03-19 DIAGNOSIS — F422 Mixed obsessional thoughts and acts: Secondary | ICD-10-CM | POA: Diagnosis not present

## 2024-03-19 DIAGNOSIS — F3113 Bipolar disorder, current episode manic without psychotic features, severe: Secondary | ICD-10-CM | POA: Diagnosis not present

## 2024-03-19 DIAGNOSIS — F410 Panic disorder [episodic paroxysmal anxiety] without agoraphobia: Secondary | ICD-10-CM | POA: Diagnosis not present

## 2024-03-20 DIAGNOSIS — F332 Major depressive disorder, recurrent severe without psychotic features: Secondary | ICD-10-CM | POA: Diagnosis not present

## 2024-03-24 ENCOUNTER — Ambulatory Visit (INDEPENDENT_AMBULATORY_CARE_PROVIDER_SITE_OTHER)

## 2024-03-24 ENCOUNTER — Ambulatory Visit (HOSPITAL_COMMUNITY)

## 2024-03-24 DIAGNOSIS — F3130 Bipolar disorder, current episode depressed, mild or moderate severity, unspecified: Secondary | ICD-10-CM | POA: Diagnosis not present

## 2024-03-24 DIAGNOSIS — R4589 Other symptoms and signs involving emotional state: Secondary | ICD-10-CM

## 2024-03-24 NOTE — Progress Notes (Unsigned)
 Psychiatric Initial Adult Assessment   Patient Identification: Alexander Harrington MRN:  [id number] Date of Evaluation:  03/24/2024 Referral Source: [facility] Chief Complaint:  worsening depression   Visit Diagnosis:    ICD-10-CM   1. Bipolar I disorder, most recent episode depressed (HCC)  F31.30       History of Present Illness: Alexander Harrington 33 year old male presents after referral by [facility] urgent care.  He states he has a history related to bipolar disorder, posttraumatic stress disorder, attention deficit disorder, major depressive disorder and obsessive-compulsive disorder.  States he is currently followed by [facility]. Amy Main.   States he is currently prescribed Olanzapine, Lybalvi, Remeron and Klonopin .   Patient denied history with substance abuse.  Her alcohol  misuse.  Denied that is currently employed.  States he currently resides with his father.  Reports a pending court case due to becoming psychotic and striking his girlfriend.  Reports he is currently have a no contact order.  Reports his next court date is September 17 preliminary hearing in [place] .   Alexander Harrington reports a good appetite.  States he is resting well throughout the night.  Reports previous inpatient admissions stated that he had completed partial hospitalization programming in the past.  He does report a family history related to mental illness.  States his mother was diagnosed with anxiety depression paternal grandmother bipolar disorder patient to start partial hospitalization program in [place] 03/24/2024  Alexander Harrington is sitting;he is alert/oriented x 4; calm/cooperative; and mood congruent with affect.  Patient is speaking in a clear tone at moderate volume, and normal pace; with good eye contact. His  thought process is coherent and relevant; There is no indication that he is currently responding to internal/external stimuli or experiencing delusional  thought content.  Patient denies suicidal/self-harm/homicidal ideation, psychosis, and paranoia.  Patient has remained calm throughout assessment and has answered questions appropriately.   Associated Signs/Symptoms: Depression Symptoms:  depressed mood, anxiety, (Hypo) Manic Symptoms:  Distractibility, Irritable Mood, Anxiety Symptoms:  Excessive Worry, Social Anxiety, Psychotic Symptoms:  Hallucinations: None PTSD Symptoms: Avoidance:  Decreased Interest/Participation  Past Psychiatric History:   Previous Psychotropic Medications: No   Substance Abuse History in the last 12 months:  No.  Consequences of Substance Abuse: NA  Past Medical History:  Past Medical History:  Diagnosis Date   ADHD    Anxiety    Dr. [name]   Bipolar 2 disorder (HCC)    Depression    HLD (hyperlipidemia)    Insomnia    Sleep apnea    do not use CPAP    Past Surgical History:  Procedure Laterality Date   DRUG INDUCED ENDOSCOPY N/A 08/28/2022   Procedure: DRUG INDUCED ENDOSCOPY;  Surgeon: Carlie Clark, MD;  Location: [facility];  Service: ENT;  Laterality: N/A;   OTHER SURGICAL HISTORY     sedated ect treatment   TONSILLECTOMY Bilateral 11/07/2022   Procedure: TONSILLECTOMY;  Surgeon: Carlie Clark, MD;  Location: [facility] OR;  Service: ENT;  Laterality: Bilateral;    Family Psychiatric History:   Family History:  Family History  Problem Relation Age of Onset   Arthritis Mother    Mental illness Mother    Depression Mother    Anxiety disorder Mother    Drug abuse Maternal Grandfather    Alcohol  abuse Maternal Grandfather    Drug abuse Maternal Grandmother    Alcohol  abuse Maternal Grandmother     Social History:   Social History  Socioeconomic History   Marital status: Single    Spouse name: Not on file   Number of children: Not on file   Years of education: Not on file   Highest education level: Bachelor's degree (e.g., BA, AB, BS)  Occupational History    Occupation: student  Tobacco Use   Smoking status: Never   Smokeless tobacco: Never   Tobacco comments:    Patient does vape occasionally.  No nicotine   Vaping Use   Vaping status: Never Used  Substance and Sexual Activity   Alcohol  use: Yes    Alcohol /week: 2.0 - 4.0 standard drinks of alcohol     Types: 2 - 4 Cans of beer per week    Comment: weekly   Drug use: Yes    Frequency: 1.0 times per week    Types: Marijuana    Comment: Last use 11/05/22   Sexual activity: Not Currently    Birth control/protection: Condom  Other Topics Concern   Not on file  Social History Narrative   Caffienated drinks-no   Seat belt use often-yes   Regular Exercise-no   Smoke alarm in the home-yes   Firearms/guns in the home-no   History of physical abuse-no      04/08/2013 [name] was born in [place], [place] , and grew up in [place], [place] . He is an only child, and reports that his childhood was really good. 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 He lives off campus with roommates. He denies any legal problems. He reports that he is spiritual but acting not stick. His hobbies include watching movies, reading, and writing. His social support system consists of his father, mother, friends, and cousins. 04/08/2013 AHW            Social Drivers of Health   Financial Resource Strain: Low Risk  (09/14/2023)   Overall Financial Resource Strain (CARDIA)    Difficulty of Paying Living Expenses: Not hard at all  Food Insecurity: No Food Insecurity (09/14/2023)   Hunger Vital Sign    Worried About Running Out of Food in the Last Year: Never true    Ran Out of Food in the Last Year: Never true  Transportation Needs: Unmet Transportation Needs (09/14/2023)   PRAPARE - Transportation    Lack of Transportation (Medical): Yes    Lack of Transportation (Non-Medical): Yes  Physical Activity: Insufficiently Active (09/14/2023)   Exercise Vital  Sign    Days of Exercise per Week: 3 days    Minutes of Exercise per Session: 30 min  Stress: Stress Concern Present (09/14/2023)   [employer] of Occupational Health - Occupational Stress Questionnaire    Feeling of Stress : Very much  Social Connections: Unknown (09/14/2023)   Social Connection and Isolation Panel    Frequency of Communication with Friends and Family: More than three times a week    Frequency of Social Gatherings with Friends and Family: Three times a week    Attends Religious Services: Never    Active Member of Clubs or Organizations: No    Attends Banker Meetings: Not on file    Marital Status: Patient declined    Additional Social History:   Allergies:   Allergies  Allergen Reactions   Codeine Rash    Metabolic Disorder Labs: Lab Results  Component Value Date   HGBA1C 4.9 09/18/2023   No results found for: PROLACTIN Lab Results  Component Value Date   CHOL 210 (  H) 09/18/2023   TRIG 231.0 (H) 09/18/2023   HDL 40.90 09/18/2023   CHOLHDL 5 09/18/2023   VLDL 46.2 (H) 09/18/2023   LDLCALC 123 (H) 09/18/2023   LDLCALC 163 (H) 11/09/2014   Lab Results  Component Value Date   TSH 0.98 05/01/2023    Therapeutic Level Labs: Lab Results  Component Value Date   LITHIUM  0.4 (L) 09/18/2023   No results found for: CBMZ No results found for: VALPROATE  Current Medications: Current Outpatient Medications  Medication Sig Dispense Refill   baclofen  (LIORESAL ) 10 MG tablet Take 1 tablet (10 mg total) by mouth 3 (three) times daily. 30 each 0   clonazePAM  (KLONOPIN ) 1 MG tablet Take 1 mg by mouth 3 (three) times daily as needed for anxiety.     eszopiclone (LUNESTA) 2 MG TABS tablet Take 2 mg by mouth at bedtime as needed for sleep. Take immediately before bedtime     ibuprofen  (ADVIL ) 600 MG tablet Take 1 tablet (600 mg total) by mouth every 6 (six) hours as needed. 30 tablet 0   lithium  600 MG capsule Take 600 mg by mouth at  bedtime.     LYBALVI 20-10 MG TABS Take 1 tablet by mouth at bedtime.     mirtazapine (REMERON) 15 MG tablet Take 15 mg by mouth at bedtime.     OLANZapine (ZYPREXA) 5 MG tablet Take 5 mg by mouth every morning.     prazosin (MINIPRESS) 1 MG capsule Take 1 mg by mouth at bedtime.     rizatriptan  (MAXALT ) 10 MG tablet Take 1 tablet (10 mg total) by mouth as needed for migraine. May repeat in 2 hours if needed 10 tablet 0   rosuvastatin  (CRESTOR ) 10 MG tablet Take 1 tablet (10 mg total) by mouth daily. 90 tablet 3   Suvorexant (BELSOMRA PO) Take by mouth.     VYVANSE 30 MG capsule Take 30 mg by mouth daily.     No current facility-administered medications for this visit.    Musculoskeletal: Strength & Muscle Tone: within normal limits Gait & Station: normal Patient leans: N/A  Psychiatric Specialty Exam: Review of Systems  Psychiatric/Behavioral:  Negative for sleep disturbance and suicidal ideas. The patient is nervous/anxious.   All other systems reviewed and are negative.   There were no vitals taken for this visit.There is no height or weight on file to calculate BMI.  General Appearance: Casual  Eye Contact:  Good  Speech:  Clear and Coherent  Volume:  Normal  Mood:  Anxious and Depressed  Affect:  Congruent  Thought Process:  Coherent  Orientation:  Full (Time, Place, and Person)  Thought Content:  Logical  Suicidal Thoughts:  No  Homicidal Thoughts:  No  Memory:  Immediate;   Good Recent;   Good  Judgement:  Good  Insight:  Good  Psychomotor Activity:  Normal  Concentration:  Concentration: Good  Recall:  Good  Fund of Knowledge:Good  Language: Good  Akathisia:  No  Handed:  Right  AIMS (if indicated):  not done  Assets:  Communication Skills Desire for Improvement  ADL's:  Intact  Cognition: WNL  Sleep:  Good   Screenings: AUDIT    Flowsheet Row Office Visit from 09/18/2023 in [facility] at [facility] Office Visit from  05/01/2023 in [facility] at [facility]  Alcohol  Use Disorder Identification Test Final Score (AUDIT) 3  5    ECT-MADRS    Flowsheet  Row ECT Treatment from 06/24/2017 in [facility] ECT Treatment from 06/03/2017 in [facility] ECT Treatment from 05/27/2017 in [facility]  MADRS Total Score 27 23 35   GAD-7    Flowsheet Row Counselor from 08/16/2017 in [facility] Counselor from 07/25/2017 in [facility]ITAL[facility] PROGRAM  Total GAD-7 Score 14 19   Mini-Mental    Flowsheet Row ECT Treatment from 06/24/2017 in [facility] ECT Treatment from 05/27/2017 in [facility]  Total Score (max 30 points ) 29 30   PHQ2-9    Flowsheet Row Counselor from 03/10/2024 in [facility] Counselor from 11/22/2022 in [facility] PROGRAM Office Visit from 06/04/2022 in [facility] at [facility] Nutrition from 06/08/2021 in [facility]  - [facility] Counselor from 08/16/2017 in BEHAVIORAL HEALTH PARTIAL HOSPITALIZATION PROGRAM  PHQ-2 Total Score 5 5 3 2 3   PHQ-9 Total Score 23 22 18  -- 16   Flowsheet Row Counselor from 03/10/2024 in [facility] UC from 11/11/2023 in [facility] Counselor from 11/22/2022 in BEHAVIORAL HEALTH PARTIAL HOSPITALIZATION PROGRAM  C-SSRS RISK CATEGORY Moderate Risk No Risk High Risk    Assessment and Plan:   Collaboration of Care: Medication Management AEB    patient enrolled in Partial Hospitalization Program, patient's current medications are to be continued a comprehensive treatment plan will be developed and side effects of medications have  been reviewed with patient  Treatment options and alternatives reviewed with patient and patient understands the above plan. Treatment plan was reviewed and agreed upon by NP T.Ezzard and patient Diyari Cherne need for group services.  Patient/Guardian was advised Release of Information must be obtained prior to any record release in order to collaborate their care with an outside provider. Patient/Guardian was advised if they have not already done so to contact the registration department to sign all necessary forms in order for us  to release information regarding their care.   Consent: Patient/Guardian gives verbal consent for treatment and assignment of benefits for services provided during this visit. Patient/Guardian expressed understanding and agreed to proceed.   Staci LOISE Ezzard, NP 9/2/20251:08 PM

## 2024-03-24 NOTE — Psych (Unsigned)
 [facility] PHP THERAPIST PROGRESS NOTE  Alexander Harrington [id number]   Session Time: 9:00 am - 10:00 am  Participation Level: Active  Behavioral Response: CasualAlertAnxious and Depressed  Type of Therapy: Group Therapy  Treatment Goals addressed: Coping  Progress Towards Goals: Initial  Interventions: CBT, DBT, Solution Focused, Strength-based, Supportive, and Reframing  Therapist Response: Clinician led check-in regarding current stressors and situation, and review of patient completed daily inventory. Clinician utilized active listening and empathetic response and validated patient emotions. Clinician facilitated processing group on pertinent issues.?   Summary: Patient arrived within time allowed. Patient rates his depression at a 6 and anxiety at an 8 on a scale of 1-10 with 10 being best. Pt reports his goal for today is to have a good first day of PHP. When asked about sleep and appetite, pt reports, he slept 9 hours last night and ate 2 meals yesterday. Pt denied experiencing SI/SH thoughts but endorses feelings of hopelessness. Pt able to process.?Pt engaged in discussion.?      Session Time: 10:00 am - 11:00 am  Participation Level: Active  Behavioral Response: CasualAlertAnxious and Depressed  Type of Therapy: Group Therapy  Treatment Goals addressed: Coping  Progress Towards Goals: Initial  Interventions: CBT, DBT, Solution Focused, Strength-based, Supportive, and Reframing  Therapist Response: Clinician led processing group for pt's current struggles. Group members shared stressors and provided support and feedback. Clinician brought in topics of self-love to inform discussion.  Summary: Pt able to process and provide support to group.     Session Time: 11:00 am - 12:00 pm  Participation Level: Active  Behavioral Response: CasualAlertAnxious and Depressed  Type of Therapy: Group Therapy  Treatment Goals addressed: Coping  Progress Towards Goals:  Initial  Interventions: CBT, DBT, Solution Focused, Strength-based, Supportive, and Reframing  Therapist Response: Clinician engaged patients in activity called Label Parade in which they attached post-its with negative thought labels to their clothes and were asked to describe what they saw in others. Clinician then guided patients through an exercise called "perspective-taking" in which patients were asked to visualize themselves holding onto something that is currently causing them pain and to think of it as a memory. Patients were invited to share their thoughts and feelings after the exercise was completed. Clinician utilized ACT principles to inform discussion.  Summary: Pt participated in activities and engaged in discussion.   Session Time: 12:00 pm - 12:30 pm  Participation Level: Active  Behavioral Response: CasualAlertAnxious and Depressed  Type of Therapy: Group Therapy  Treatment Goals addressed: Coping  Progress Towards Goals: Initial  Interventions: Psychologist, occupational, Supportive  Therapist Response: Reflection Group: Patients encouraged to practice skills and interpersonal techniques or work on mindfulness and relaxation techniques. The importance of self-care and making skills part of a routine to increase usage were stressed.  Summary: Patient engaged and participated appropriately.   Session Time: 12:30 pm - 1:30 pm  Participation Level: Active  Behavioral Response: CasualAlertAnxious and Depressed  Type of Therapy: Group Therapy  Treatment Goals addressed: Coping  Progress Towards Goals: Initial  Interventions: CBT, DBT, Solution Focused, Strength-based, Supportive, and Reframing  Therapist Response: Group was led by occupational therapist, Edward Hollan.  Summary:  Pt engaged and participated in discussion.   Session Time: 1:30 pm - 2:00 pm  Participation Level: Active  Behavioral Response: CasualAlertAnxious and Depressed  Type of  Therapy: Group Therapy  Treatment Goals addressed: Coping  Progress Towards Goals: Initial  Interventions: CBT, DBT, Solution Focused, Strength-based, Supportive, and Reframing  Therapist Response: 1:30 pm - 1:50 pm: Clinician utilized a guided meditation video led by yoga instructor Adriene of [employer] that focuses on self-love. Patients were invited to share their responses upon completion. 1:50 - 2:00 pm: Clinician led check-out. Clinician assessed for immediate needs, medication compliance and efficacy, and safety concerns. Clinician requested patients engage in one activity that they enjoy that they have not done recently.  Summary: 1:30 pm - 1:50 pm: Pt participated in activity. 1:50 - 2:00 pm: At check-out, patient contracts for safety.?Patient demonstrates progress as evidenced by his engagement and by being receptive to treatment. Patient denies SI/HI/self-harm thoughts at the end of group and agrees to seek help should those thoughts/feelings occur.?    Suicidal/Homicidal: Nowithout intent/plan  Plan: ?Pt will continue in PHP and medication management while continuing to work on decreasing depression symptoms,?SI, and anxiety symptoms,?and increasing the ability to self manage symptoms.     Collaboration of Care: Medication Management AEB Staci Kerns, NP  Patient/Guardian was advised Release of Information must be obtained prior to any record release in order to collaborate their care with an outside provider. Patient/Guardian was advised if they have not already done so to contact the registration department to sign all necessary forms in order for us  to release information regarding their care.   Consent: Patient/Guardian gives verbal consent for treatment and assignment of benefits for services provided during this visit. Patient/Guardian expressed understanding and agreed to proceed.   Diagnosis: Bipolar I disorder, most recent episode depressed (HCC)  [F31.30]    1. Bipolar I disorder, most recent episode depressed [facility])       Will LILLETTE Pollack, LCSW 03/24/2024

## 2024-03-25 ENCOUNTER — Ambulatory Visit (INDEPENDENT_AMBULATORY_CARE_PROVIDER_SITE_OTHER): Admitting: Licensed Clinical Social Worker

## 2024-03-25 ENCOUNTER — Ambulatory Visit (HOSPITAL_COMMUNITY)

## 2024-03-25 DIAGNOSIS — R4589 Other symptoms and signs involving emotional state: Secondary | ICD-10-CM

## 2024-03-25 DIAGNOSIS — F3130 Bipolar disorder, current episode depressed, mild or moderate severity, unspecified: Secondary | ICD-10-CM

## 2024-03-25 NOTE — Psych (Signed)
 [facility] PHP THERAPIST PROGRESS NOTE  Alexander Harrington [id number]  Session Time: 9:00 - 10:00  Participation Level: Active  Behavioral Response: CasualAlertDepressed  Type of Therapy: Group Therapy  Treatment Goals addressed: Coping  Progress Towards Goals: Initial  Interventions: CBT, DBT, Supportive, and Reframing  Summary: Alexander Harrington is a 33 y.o. male who presents with depression symptoms.  Clinician led check-in regarding current stressors and situation, and review of patient completed daily inventory. Clinician utilized active listening and empathetic response and validated patient emotions. Clinician facilitated processing group on pertinent issues.?   Summary: Patient arrived within time allowed. Patient rates their depression at a 1 and anxiety at a 1 on a scale of 1-10 with 10 being high. Pt reports sleeping 8 hours land eating 2 times yesterday. Pt reports their sleep was strange and that they don't remember sleeping but time has passed, and they don't feel rested, but have energy. Pt states they will continue to monitor. Pt reports they spoke to an old boss this morning and was able to come back. Pt states they are excited about having a job and is focused on staying on their medication and therapy because they recognize that stopping is what caused them to fall off in the past. Pt denied experiencing SI/SH thoughts and hopelessness. Pt able to process.?Pt engaged in discussion.?      Session Time: 10:00 am - 11:00 am  Participation Level: Active  Behavioral Response: CasualAlertAnxious and Depressed  Type of Therapy: Group Therapy  Treatment Goals addressed: Coping  Progress Towards Goals: Initial  Interventions: CBT, DBT,  Supportive, and Reframing  Therapist Response: Clinician led discussion on coping as a spectrum. Cln conceptualized coping skills as existing on a line of healthy to unhealthy and the goal being moving closer to the healthy side of the line  with each coping decision. Group discussed current coping skills they use and where it would fall on the coping spectrum as well as identifying one shift closer to healthy.   Summary: Pt engaged in discussion and is able to apply discussion to themself.     Session Time: 11:00 am - 12:00 pm  Participation Level: Active  Behavioral Response: CasualAlertAnxious and Depressed  Type of Therapy: Group Therapy  Treatment Goals addressed: Coping  Progress Towards Goals: Initial  Interventions: CBT, DBT,  Supportive, and Reframing  Therapist Response: Cln led discussion on protective factors and how they are things within our control that we can work on to improve our health and mental fortitude. Group discussed where they feel like they are with certain protective factors and how they can take a baby step to increase where they are with protective factors.   Summary: Pt engaged in discussion and identifies Physical Health and Healthy Thinking as factors they wish to improve most upon.       Session Time: 12:00 pm - 12:30 pm  Participation Level: Active  Behavioral Response: CasualAlertDepressed  Type of Therapy: Group Therapy  Treatment Goals addressed: Coping  Progress Towards Goals: Initial  Interventions: Psychologist, occupational, Supportive  Therapist Response: Reflection Group: Patients encouraged to practice skills and interpersonal techniques or work on mindfulness and relaxation techniques. The importance of self-care and making skills part of a routine to increase usage were stressed.  Summary: Patient engaged and participated appropriately.     Session Time: 12:30 pm - 1:30 pm  Participation Level: Active  Behavioral Response: CasualAlertDepressed  Type of Therapy: Group Therapy  Treatment Goals addressed: Coping  Progress Towards  Goals: Initial  Interventions: Occupational Therapy  Therapist Response: Group was led by occupational therapist, Edward  Hollan.   Summary: Pt engaged and participated in discussion.      Session Time: 1:30 pm - 2:00 pm  Participation Level: Active  Behavioral Response: CasualAlertDepressed  Type of Therapy: Group Therapy  Treatment Goals addressed: Coping  Progress Towards Goals: Initial  Interventions: CBT, DBT,  Supportive, and Reframing  Therapist Response: 1:30 pm - 1:50 pm: Clinician utilized DBT mindfulness practice of paced breathing. Cln led group through multiple breathing exercises as ways to practice mindful breathing.  1:50 - 2:00 pm: Clinician led check-out. Clinician assessed for immediate needs, medication compliance and efficacy, and safety concerns. Clinician requested patients engage in one activity that they enjoy that they have not done recently.  Summary: 1:30 pm - 1:50 pm: Pt participated in activity.  1:50 - 2:00 pm: At check-out, patient contracts for safety.?Patient demonstrates progress as evidenced by continued engagement and responsiveness to treatment. Patient denies SI/HI/self-harm thoughts at the end of group and agrees to seek help should those thoughts/feelings occur.   Suicidal/Homicidal: Nowithout intent/plan  Plan: Pt will continue in PHP while working to decrease depression symptoms, increase daily functioning, and increase ability to manage symptoms in a healthy manner.   Collaboration of Care: Medication Management AEB T Lewis, NP  Patient/Guardian was advised Release of Information must be obtained prior to any record release in order to collaborate their care with an outside provider. Patient/Guardian was advised if they have not already done so to contact the registration department to sign all necessary forms in order for us  to release information regarding their care.   Consent: Patient/Guardian gives verbal consent for treatment and assignment of benefits for services provided during this visit. Patient/Guardian expressed understanding and agreed to  proceed.   Diagnosis: Bipolar I disorder, most recent episode depressed (HCC) [F31.30]    1. Bipolar I disorder, most recent episode depressed (HCC)       Randall Bastos, LCSW 03/25/2024

## 2024-03-26 ENCOUNTER — Telehealth (HOSPITAL_COMMUNITY): Payer: Self-pay | Admitting: Licensed Clinical Social Worker

## 2024-03-26 ENCOUNTER — Ambulatory Visit (HOSPITAL_COMMUNITY)

## 2024-03-26 ENCOUNTER — Ambulatory Visit (INDEPENDENT_AMBULATORY_CARE_PROVIDER_SITE_OTHER): Admitting: Licensed Clinical Social Worker

## 2024-03-26 DIAGNOSIS — F3130 Bipolar disorder, current episode depressed, mild or moderate severity, unspecified: Secondary | ICD-10-CM | POA: Diagnosis not present

## 2024-03-26 DIAGNOSIS — R4589 Other symptoms and signs involving emotional state: Secondary | ICD-10-CM

## 2024-03-26 NOTE — Telephone Encounter (Signed)
 Cln called pt's attorney, Chauncey Pair, to confirm the correct fax number for cln to send requested letter and CCA, as the fax number provided was incorrect. Mr. Pair confirmed the fax to be: [phone] and his email address as [email or web address]. Cln provided instructions for responding to secure email to confirm receipt. Cln completed new ROI for pt to sign with updated contact information and emailed to [employer], who is running group and is with pt at this time.

## 2024-03-27 ENCOUNTER — Ambulatory Visit (INDEPENDENT_AMBULATORY_CARE_PROVIDER_SITE_OTHER): Admitting: Licensed Clinical Social Worker

## 2024-03-27 ENCOUNTER — Ambulatory Visit (HOSPITAL_COMMUNITY)

## 2024-03-27 DIAGNOSIS — R4589 Other symptoms and signs involving emotional state: Secondary | ICD-10-CM

## 2024-03-27 DIAGNOSIS — F3130 Bipolar disorder, current episode depressed, mild or moderate severity, unspecified: Secondary | ICD-10-CM

## 2024-03-29 DIAGNOSIS — F332 Major depressive disorder, recurrent severe without psychotic features: Secondary | ICD-10-CM | POA: Diagnosis not present

## 2024-03-30 ENCOUNTER — Ambulatory Visit (HOSPITAL_COMMUNITY)

## 2024-03-30 ENCOUNTER — Ambulatory Visit (INDEPENDENT_AMBULATORY_CARE_PROVIDER_SITE_OTHER): Admitting: Licensed Clinical Social Worker

## 2024-03-30 DIAGNOSIS — F3130 Bipolar disorder, current episode depressed, mild or moderate severity, unspecified: Secondary | ICD-10-CM | POA: Diagnosis not present

## 2024-03-30 DIAGNOSIS — R4589 Other symptoms and signs involving emotional state: Secondary | ICD-10-CM

## 2024-03-30 NOTE — Psych (Addendum)
 [facility] PHP THERAPIST PROGRESS NOTE  Alexander Harrington [id number]   Session Time: 9:00 am - 10:00 am  Participation Level: Active  Behavioral Response: CasualAlertAnxious and Depressed  Type of Therapy: Group Therapy  Treatment Goals addressed: Coping  Progress Towards Goals: Progressing  Interventions: CBT, DBT, Solution Focused, Strength-based, Supportive, and Reframing  Therapist Response: Clinician led check-in regarding current stressors and situation, and review of patient completed daily inventory. Clinician utilized active listening and empathetic response and validated patient emotions. Clinician facilitated processing group on pertinent issues.?   Summary: Patient arrived within time allowed. Patient rates his depression at a 6 and anxiety at a 7 on a scale of 1-10 with 10 being best. Pt reports he struggled with increased depression and anxiety over the weekend and shares that he coped with both by utilizing distractions and increased his socialization by talking to his cousin, aunt, and a friend. When asked about sleep and appetite, pt reports he slept 9 hours last night and ate 3 meals yesterday. Pt denied experiencing SI/SH thoughts and endorses feelings of hopelessness. Pt able to process.?Pt engaged in discussion.?      Session Time: 10:00 am - 11:00 am  Participation Level: Active  Behavioral Response: CasualAlertAnxious and Depressed  Type of Therapy: Group Therapy  Treatment Goals addressed: Coping  Progress Towards Goals: Progressing  Interventions: CBT, DBT, Solution Focused, Strength-based, Supportive, and Reframing  Therapist Response: Clinician led processing group for pt's current struggles. Group members shared stressors and provided support and feedback. Clinician brought in topics of acceptance, specifically accepting anxiety, to inform discussion.  Summary: Pt able to process and provide support to group.     Session Time: 11:00 am - 12:00  pm  Participation Level: Active  Behavioral Response: CasualAlertAnxious and Depressed  Type of Therapy: Group Therapy  Treatment Goals addressed: Coping  Progress Towards Goals: Progressing  Interventions: CBT, DBT, Solution Focused, Strength-based, Supportive, and Reframing  Therapist Response: Clinician led group on coping with grief utilizing three theoretical frameworks; the five stages of grief Eletha), growing around the grief Melvina), and the dual process model of grief (Stroebe and Chatfield). Clinician also read and passed around a narrative comparing grief to ocean waves.   Summary: Pt engaged in discussion. Pt shares how the loss of his mother has made him feel like he doesn't have a home, as the original family unit of him and his parents felt like home. He is receptive to feedback and expresses support to peers.   Session Time: 12:00 pm - 12:30 pm  Participation Level: Active  Behavioral Response: CasualAlertAnxious and Depressed  Type of Therapy: Group Therapy  Treatment Goals addressed: Coping  Progress Towards Goals: Progressing  Interventions: Psychologist, occupational, Supportive  Therapist Response: Reflection Group: Patients encouraged to practice skills and interpersonal techniques or work on mindfulness and relaxation techniques. The importance of self-care and making skills part of a routine to increase usage were stressed.  Summary: Patient engaged and participated appropriately.   Session Time: 12:30 pm - 1:30 pm  Participation Level: Active  Behavioral Response: CasualAlertAnxious and Depressed  Type of Therapy: Group Therapy  Treatment Goals addressed: Coping  Progress Towards Goals: Progressing  Interventions: CBT, DBT, Solution Focused, Strength-based, Supportive, and Reframing  Therapist Response: Group was led by occupational therapist, Edward Hollan.   Summary: Pt engaged and participated in discussion.   Session Time: 1:30 pm  - 2:00 pm  Participation Level: Active  Behavioral Response: CasualAlertAnxious and Depressed  Type of Therapy: Group  Therapy  Treatment Goals addressed: Coping  Progress Towards Goals: Progressing  Interventions: CBT, DBT, Solution Focused, Strength-based, Supportive, and Reframing  Therapist Response: 1:30 pm - 1:50 pm: Clinician led patients through a standing and gentle yoga practice by playing and following along with a recording of the practice from Yoga With Adriene. Patients were advised to modify postures if uncomfortable. 1:50 - 2:00 pm: Clinician led check-out. Clinician assessed for immediate needs, medication compliance and efficacy, and safety concerns?  Summary: 1:30 pm - 1:50 pm: Pt participated in activity 1:50 - 2:00 pm: At check-out, patient contracts for safety.?Patient demonstrates progress as evidenced by his continued engagement and by being receptive to treatment. Patient denies SI/HI/self-harm thoughts at the end of group and agrees to seek help should those thoughts/feelings occur.?    Suicidal/Homicidal: Nowithout intent/plan  Plan: ?Pt will continue in PHP and medication management while continuing to work on decreasing depression symptoms,?SI, and anxiety symptoms,?and increasing the ability to self manage symptoms. Pt also reminded cln that he will be out next week (9/15-9/19) due to traveling to [facility] for court.    Collaboration of Care: Medication Management AEB Staci Kerns, NP  Patient/Guardian was advised Release of Information must be obtained prior to any record release in order to collaborate their care with an outside provider. Patient/Guardian was advised if they have not already done so to contact the registration department to sign all necessary forms in order for us  to release information regarding their care.   Consent: Patient/Guardian gives verbal consent for treatment and assignment of benefits for services provided during this visit.  Patient/Guardian expressed understanding and agreed to proceed.   Diagnosis: Bipolar I disorder, most recent episode depressed (HCC) [F31.30]    1. Bipolar I disorder, most recent episode depressed [facility])       Will LILLETTE Pollack, LCSW 03/30/2024

## 2024-03-30 NOTE — Progress Notes (Signed)
 [facility] MD/PA/NP OP Progress Note  03/30/2024 11:53 AM Alexander Harrington  MRN:  [id number]  Chief Complaint: Weekly progress assessment note  HPI: Alexander Harrington 33 year old male presents for weekly follow-up with assessment note while attending partial hospitalization program he.  Reports increased depression and anxiety symptoms.  States he does not know whether to attribute his mood to the season changing and or the upcoming court case next Tuesday.  Reports he has been taking his medications as directed.  States learned multiple coping skills to include mindfulness breathing techniques.  States he is eager and anxious to get this situation behind him.    Alexander Harrington denied concerns related to suicidal or homicidal ideations.  Denies auditory or visual hallucinations.  States he has plans to drive to [place]  next week.  States his father has been supportive.  Denied delusional or paranoid ideations.  No other documented concerns noted at this visit.  Support and encouragement reassurance was provided.   Visit Diagnosis:    ICD-10-CM   1. Bipolar I disorder, most recent episode depressed (HCC)  F31.30       Past Psychiatric History:   Past Medical History:  Past Medical History:  Diagnosis Date   ADHD    Anxiety    Dr. [name]   Bipolar 2 disorder (HCC)    Depression    HLD (hyperlipidemia)    Insomnia    Sleep apnea    do not use CPAP    Past Surgical History:  Procedure Laterality Date   DRUG INDUCED ENDOSCOPY N/A 08/28/2022   Procedure: DRUG INDUCED ENDOSCOPY;  Surgeon: Carlie Clark, MD;  Location: [facility];  Service: ENT;  Laterality: N/A;   OTHER SURGICAL HISTORY     sedated ect treatment   TONSILLECTOMY Bilateral 11/07/2022   Procedure: TONSILLECTOMY;  Surgeon: Carlie Clark, MD;  Location: [facility] OR;  Service: ENT;  Laterality: Bilateral;    Family Psychiatric History:   Family History:  Family History  Problem Relation Age of Onset   Arthritis Mother    Mental  illness Mother    Depression Mother    Anxiety disorder Mother    Drug abuse Maternal Grandfather    Alcohol  abuse Maternal Grandfather    Drug abuse Maternal Grandmother    Alcohol  abuse Maternal Grandmother     Social History:  Social History   Socioeconomic History   Marital status: Single    Spouse name: Not on file   Number of children: Not on file   Years of education: Not on file   Highest education level: Bachelor's degree (e.g., BA, AB, BS)  Occupational History   Occupation: student  Tobacco Use   Smoking status: Never   Smokeless tobacco: Never   Tobacco comments:    Patient does vape occasionally.  No nicotine   Vaping Use   Vaping status: Never Used  Substance and Sexual Activity   Alcohol  use: Yes    Alcohol /week: 2.0 - 4.0 standard drinks of alcohol     Types: 2 - 4 Cans of beer per week    Comment: weekly   Drug use: Yes    Frequency: 1.0 times per week    Types: Marijuana    Comment: Last use 11/05/22   Sexual activity: Not Currently    Birth control/protection: Condom  Other Topics Concern   Not on file  Social History Narrative   Caffienated drinks-no   Seat belt use often-yes   Regular Exercise-no   Smoke alarm in the home-yes  Firearms/guns in the home-no   History of physical abuse-no      04/08/2013 Alexander Harrington was born in [place], [place] , and grew up in [place], [place] . He is an only child, and reports that his childhood was really good. 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 He lives off campus with roommates. He denies any legal problems. He reports that he is spiritual but acting not stick. His hobbies include watching movies, reading, and writing. His social support system consists of his father, mother, friends, and cousins. 04/08/2013 Alexander            Social Drivers of Health   Financial Resource Strain: Low Risk  (09/14/2023)   Overall Financial Resource Strain (CARDIA)     Difficulty of Paying Living Expenses: Not hard at all  Food Insecurity: No Food Insecurity (09/14/2023)   Hunger Vital Sign    Worried About Running Out of Food in the Last Year: Never true    Ran Out of Food in the Last Year: Never true  Transportation Needs: Unmet Transportation Needs (09/14/2023)   PRAPARE - Transportation    Lack of Transportation (Medical): Yes    Lack of Transportation (Non-Medical): Yes  Physical Activity: Insufficiently Active (09/14/2023)   Exercise Vital Sign    Days of Exercise per Week: 3 days    Minutes of Exercise per Session: 30 min  Stress: Stress Concern Present (09/14/2023)   [employer] of Occupational Health - Occupational Stress Questionnaire    Feeling of Stress : Very much  Social Connections: Unknown (09/14/2023)   Social Connection and Isolation Panel    Frequency of Communication with Friends and Family: More than three times a week    Frequency of Social Gatherings with Friends and Family: Three times a week    Attends Religious Services: Never    Active Member of Clubs or Organizations: No    Attends Banker Meetings: Not on file    Marital Status: Patient declined    Allergies:  Allergies  Allergen Reactions   Codeine Rash    Metabolic Disorder Labs: Lab Results  Component Value Date   HGBA1C 4.9 09/18/2023   No results found for: PROLACTIN Lab Results  Component Value Date   CHOL 210 (H) 09/18/2023   TRIG 231.0 (H) 09/18/2023   HDL 40.90 09/18/2023   CHOLHDL 5 09/18/2023   VLDL 46.2 (H) 09/18/2023   LDLCALC 123 (H) 09/18/2023   LDLCALC 163 (H) 11/09/2014   Lab Results  Component Value Date   TSH 0.98 05/01/2023   TSH 1.39 06/04/2022    Therapeutic Level Labs: Lab Results  Component Value Date   LITHIUM  0.4 (L) 09/18/2023   LITHIUM  0.4 (L) 05/01/2023   No results found for: VALPROATE No results found for: CBMZ  Current Medications: Current Outpatient Medications  Medication Sig  Dispense Refill   baclofen  (LIORESAL ) 10 MG tablet Take 1 tablet (10 mg total) by mouth 3 (three) times daily. 30 each 0   clonazePAM  (KLONOPIN ) 1 MG tablet Take 1 mg by mouth 3 (three) times daily as needed for anxiety.     eszopiclone (LUNESTA) 2 MG TABS tablet Take 2 mg by mouth at bedtime as needed for sleep. Take immediately before bedtime     ibuprofen  (ADVIL ) 600 MG tablet Take 1 tablet (600 mg total) by mouth every 6 (six) hours as needed. 30 tablet 0   lithium  600 MG capsule Take 600  mg by mouth at bedtime.     LYBALVI 20-10 MG TABS Take 1 tablet by mouth at bedtime.     mirtazapine (REMERON) 15 MG tablet Take 15 mg by mouth at bedtime.     OLANZapine (ZYPREXA) 5 MG tablet Take 5 mg by mouth every morning.     prazosin (MINIPRESS) 1 MG capsule Take 1 mg by mouth at bedtime.     rizatriptan  (MAXALT ) 10 MG tablet Take 1 tablet (10 mg total) by mouth as needed for migraine. May repeat in 2 hours if needed 10 tablet 0   rosuvastatin  (CRESTOR ) 10 MG tablet Take 1 tablet (10 mg total) by mouth daily. 90 tablet 3   Suvorexant (BELSOMRA PO) Take by mouth.     VYVANSE 30 MG capsule Take 30 mg by mouth daily.     No current facility-administered medications for this visit.     Musculoskeletal: Strength & Muscle Tone: within normal limits Gait & Station: normal Patient leans: N/A  Psychiatric Specialty Exam: Review of Systems  There were no vitals taken for this visit.There is no height or weight on file to calculate BMI.  General Appearance: Casual  Eye Contact:  Good  Speech:  Clear and Coherent  Volume:  Normal  Mood:  Anxious and Depressed  Affect:  Congruent  Thought Process:  Coherent  Orientation:  Full (Time, Place, and Person)  Thought Content: Logical   Suicidal Thoughts:  No  Homicidal Thoughts:  No  Memory:  Immediate;   Fair Recent;   Fair  Judgement:  Good  Insight:  Good  Psychomotor Activity:  Normal  Concentration:  Concentration: Fair  Recall:  Fair  Fund  of Knowledge: Good  Language: Fair  Akathisia:  No  Handed:  Right  AIMS (if indicated): not done  Assets:  Communication Skills Desire for Improvement  ADL's:  Intact  Cognition: WNL  Sleep:  Fair   Screenings: AUDIT    Garment/textile technologist Visit from 09/18/2023 in [facility] at [facility] Office Visit from 05/01/2023 in [facility] at [facility]  Alcohol  Use Disorder Identification Test Final Score (AUDIT) 3  5    ECT-MADRS    Flowsheet Row ECT Treatment from 06/24/2017 in [facility] ECT Treatment from 06/03/2017 in [facility] ECT Treatment from 05/27/2017 in [facility]  MADRS Total Score 27 23 35   GAD-7    Flowsheet Row Counselor from 03/24/2024 in [facility] Counselor from 08/16/2017 in [facility] PROGRAM Counselor from 07/25/2017 in BEHAVIORAL HEALTH PARTIAL HOSPITALIZATION PROGRAM  Total GAD-7 Score 17 14 19    Mini-Mental    Flowsheet Row ECT Treatment from 06/24/2017 in [facility] ECT Treatment from 05/27/2017 in [facility]  Total Score (max 30 points ) 29 30   PHQ2-9    Flowsheet Row Counselor from 03/24/2024 in [facility] Counselor from 03/10/2024 in [facility] Counselor from 11/22/2022 in BEHAVIORAL [facility] PARTIAL HOSPITALIZATION PROGRAM Office Visit from 06/04/2022 in [facility] Primary Care at [facility] [place] Nutrition from 06/08/2021 in [facility]  - A [facility] [place]  PHQ-2 Total Score 6 5 5 3 2   PHQ-9 Total Score 23 23 22 18  --   Flowsheet Row Counselor from 03/10/2024  in [facility] UC from 11/11/2023 in [facility]) Counselor from 11/22/2022 in BEHAVIORAL HEALTH PARTIAL HOSPITALIZATION PROGRAM  C-SSRS RISK CATEGORY Moderate Risk No Risk High Risk     Assessment and Plan:  Partial hospitalization programming [facility] -Continue medications as directed - Last LI level ?  6 months lithium  level 0.4   Collaboration of Care: Collaboration of Care: Medication Management AEB currently prescribed lithium , Vyvanse, olanzapine and Minipress  Patient/Guardian was advised Release of Information must be obtained prior to any record release in order to collaborate their care with an outside provider. Patient/Guardian was advised if they have not already done so to contact the registration department to sign all necessary forms in order for us  to release information regarding their care.   Consent: Patient/Guardian gives verbal consent for treatment and assignment of benefits for services provided during this visit. Patient/Guardian expressed understanding and agreed to proceed.    Staci LOISE Kerns, NP 03/30/2024, 11:53 AM

## 2024-03-31 ENCOUNTER — Encounter (HOSPITAL_COMMUNITY): Payer: Self-pay | Admitting: Licensed Clinical Social Worker

## 2024-03-31 ENCOUNTER — Ambulatory Visit (HOSPITAL_COMMUNITY)

## 2024-03-31 ENCOUNTER — Ambulatory Visit (INDEPENDENT_AMBULATORY_CARE_PROVIDER_SITE_OTHER): Admitting: Licensed Clinical Social Worker

## 2024-03-31 DIAGNOSIS — F3130 Bipolar disorder, current episode depressed, mild or moderate severity, unspecified: Secondary | ICD-10-CM

## 2024-03-31 DIAGNOSIS — R4589 Other symptoms and signs involving emotional state: Secondary | ICD-10-CM

## 2024-03-31 NOTE — Psych (Signed)
 [facility] PHP THERAPIST PROGRESS NOTE  Alexander Harrington [id number]  Session Time: 9:00 - 10:00  Participation Level: Active  Behavioral Response: CasualAlertDepressed  Type of Therapy: Group Therapy  Treatment Goals addressed: Coping  Progress Towards Goals: Initial  Interventions: CBT, DBT, Supportive, and Reframing  Summary: Alexander Harrington is a 33 y.o. male who presents with depression symptoms.  Clinician led check-in regarding current stressors and situation, and review of patient completed daily inventory. Clinician utilized active listening and empathetic response and validated patient emotions. Clinician facilitated processing group on pertinent issues.?   Summary: Patient arrived within time allowed. Patient rates his depression at a 6 and anxiety at a 7 on a scale of 1-10 with 10 being high. Pt reports sleeping 9 hours and eating 3 times yesterday. Pt reports feeling like I can'Alexander escape despair. Pt continues to struggle with rumination on unknowns re: his upcoming court date and negative self talk. Pt reports engaging with self care activities but was unable to get out of his feelings. Cln challenged pt that he may be looking for absence of pain versus being able to move through pain and pt is resistant. Pt identifies passive SI yesterday and denies plan or intent. Pt denies current SI/HI. Pt able to process.?Pt engaged in discussion.?       Session Time: 10:00 am - 11:00 am  Participation Level: Active  Behavioral Response: CasualAlertAnxious and Depressed  Type of Therapy: Group Therapy  Treatment Goals addressed: Coping  Progress Towards Goals: Initial  Interventions: CBT, DBT,  Supportive, and Reframing  Therapist Response: Cln led discussion on healthy aggression substitutes. Cln discussed the benefits to discharging energy and adrenaline when feeling revved up in emotion and the importance of balancing that discharge with safety and lack of negative consequences.  Group brainstormed different ways to channel aggression in a healthy way and shared ways that have worked for them in the past.  Summary: Pt engaged in discussion and identifies 3 options to try.       Session Time: 11:00 am - 12:00 pm  Participation Level: Active  Behavioral Response: CasualAlertAnxious and Depressed  Type of Therapy: Group Therapy  Treatment Goals addressed: Coping  Progress Towards Goals: Initial  Interventions: CBT, DBT,  Supportive, and Reframing  Therapist Response: Cln introduced CBT and the way in which it can provide context for addressing stumbling blocks. Group discussed the problem is not the problem, the problem is how we're thinking about the problem and tried to change perspective on current struggles.   Summary: Pt engaged in discussion and is able to attempt reframing using CBT.      Session Time: 12:00 pm - 1:00 pm  Participation Level: Active  Behavioral Response: CasualAlertDepressed  Type of Therapy: Group Therapy  Treatment Goals addressed: Coping  Progress Towards Goals: Initial  Interventions: OT   Therapist Response: 12:00 pm - 12:50 pm: OT group was led by occupational therapist, Alexander Harrington.  12:50 - 1:00 pm: Clinician led check-out. Clinician assessed for immediate needs, medication compliance and efficacy, and safety concerns. Clinician requested patients engage in one activity that they enjoy that they have not done recently.  Summary: 12:00 pm - 12:50 pm: Pt participated and engaged in discussion. 12:50 - 1:00 pm: At check-out, patient contracts for safety.?Patient demonstrates progress as evidenced by continued engagement and responsiveness to treatment. Patient denies SI/HI/self-harm thoughts at the end of group and agrees to seek help should those thoughts/feelings occur.    Suicidal/Homicidal: Nowithout intent/plan  Plan: Pt  will continue in PHP while working to decrease depression symptoms, increase daily  functioning, and increase ability to manage symptoms in a healthy manner.   Collaboration of Care: Medication Management AEB Alexander Lewis, NP  Patient/Guardian was advised Release of Information must be obtained prior to any record release in order to collaborate their care with an outside provider. Patient/Guardian was advised if they have not already done so to contact the registration department to sign all necessary forms in order for us  to release information regarding their care.   Consent: Patient/Guardian gives verbal consent for treatment and assignment of benefits for services provided during this visit. Patient/Guardian expressed understanding and agreed to proceed.   Diagnosis: Bipolar I disorder, most recent episode depressed (HCC) [F31.30]    1. Bipolar I disorder, most recent episode depressed (HCC)       Randall Bastos, LCSW 03/31/2024
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
## 2024-03-31 NOTE — Psych (Signed)
 [facility] PHP THERAPIST PROGRESS NOTE  Alexander Harrington [id number]  Session Time: 9:00 - 10:00  Participation Level: Active  Behavioral Response: CasualAlertDepressed  Type of Therapy: Group Therapy  Treatment Goals addressed: Coping  Progress Towards Goals: Initial  Interventions: CBT, DBT, Supportive, and Reframing  Summary: Alexander Harrington is a 33 y.o. male who presents with depression symptoms.  Clinician led check-in regarding current stressors and situation, and review of patient completed daily inventory. Clinician utilized active listening and empathetic response and validated patient emotions. Clinician facilitated processing group on pertinent issues.?   Summary: Patient arrived within time allowed. Patient rates his depression at a 8 and anxiety at a 6 on a scale of 1-10 with 10 being high. Pt reports sleeping 10 hours and eating 3 times yesterday. Pt reports feeling pretty down yesterday and experiencing intense feelings of despair, crying spells, and fatigue. Pt reports eating, taking a bath, and trying to watch a movie as attempts at distraction with mild success. Pt struggles with attaching to feelings. Pt identifies passive SI yesterday and denies plan or intent. Pt denies current SI/HI. Pt able to process.?Pt engaged in discussion.?      Session Time: 10:00 am - 11:00 am  Participation Level: Active  Behavioral Response: CasualAlertAnxious and Depressed  Type of Therapy: Group Therapy  Treatment Goals addressed: Coping  Progress Towards Goals: Initial  Interventions: CBT, DBT,  Supportive, and Reframing  Therapist Response: Cln led discussion on control and the way it impacts our lives. Group members shared struggles and worked to identify the way in which control is contributing to the struggle. Cln utilized DBT wise mind in discussion.   Summary: Pt engaged in discussion and is able to determine ways in which control is an issue for them and brainstormed how  to address it in a healthy manner.     Session Time: 11:00 am - 12:00 pm  Participation Level: Active  Behavioral Response: CasualAlertAnxious and Depressed  Type of Therapy: Group Therapy  Treatment Goals addressed: Coping  Progress Towards Goals: Initial  Interventions: CBT, DBT,  Supportive, and Reframing  Therapist Response: Cln led discussion on extending grace and kindness to ourselves. Group discussed the messages they give themselves and how it impacts them. Cln discussed the best friend test as a way to calibrate whether we are being fair to ourselves or not and encouraged pt's to consider, would I believe this/say this about someone I cared about?    Summary: Pt engaged in discussion and shares difficulties with being kind to themself.       Session Time: 12:00 pm - 12:30 pm  Participation Level: Active  Behavioral Response: CasualAlertDepressed  Type of Therapy: Group Therapy  Treatment Goals addressed: Coping  Progress Towards Goals: Initial  Interventions: Psychologist, occupational, Supportive  Therapist Response: Reflection Group: Patients encouraged to practice skills and interpersonal techniques or work on mindfulness and relaxation techniques. The importance of self-care and making skills part of a routine to increase usage were stressed.  Summary: Patient engaged and participated appropriately.     Session Time: 12:30 pm - 1:30 pm  Participation Level: Active  Behavioral Response: CasualAlertDepressed  Type of Therapy: Group Therapy  Treatment Goals addressed: Coping  Progress Towards Goals: Initial  Interventions: Occupational Therapy  Therapist Response: Group was led by occupational therapist, Edward Hollan.   Summary: Pt engaged and participated in discussion.      Session Time: 1:30 pm - 2:00 pm  Participation Level: Active  Behavioral Response: CasualAlertDepressed  Type of Therapy: Group Therapy  Treatment Goals  addressed: Coping  Progress Towards Goals: Initial  Interventions: CBT, DBT,  Supportive, and Reframing  Therapist Response: 1:30 pm - 1:50 pm: Clinician utilized DBT mindfulness practice of paced breathing. Cln led group through diaphragmatic breathing and mindfulness practice.  1:50 - 2:00 pm: Clinician led check-out. Clinician assessed for immediate needs, medication compliance and efficacy, and safety concerns. Clinician requested patients engage in one activity that they enjoy that they have not done recently.  Summary: 1:30 pm - 1:50 pm: Pt participated in activity.  1:50 - 2:00 pm: At check-out, patient contracts for safety.?Patient demonstrates progress as evidenced by continued engagement and responsiveness to treatment. Patient denies SI/HI/self-harm thoughts at the end of group and agrees to seek help should those thoughts/feelings occur.   Suicidal/Homicidal: Nowithout intent/plan  Plan: Pt will continue in PHP while working to decrease depression symptoms, increase daily functioning, and increase ability to manage symptoms in a healthy manner.   Collaboration of Care: Medication Management AEB T Lewis, NP  Patient/Guardian was advised Release of Information must be obtained prior to any record release in order to collaborate their care with an outside provider. Patient/Guardian was advised if they have not already done so to contact the registration department to sign all necessary forms in order for us  to release information regarding their care.   Consent: Patient/Guardian gives verbal consent for treatment and assignment of benefits for services provided during this visit. Patient/Guardian expressed understanding and agreed to proceed.   Diagnosis: Bipolar I disorder, most recent episode depressed (HCC) [F31.30]    1. Bipolar I disorder, most recent episode depressed (HCC)       Randall Bastos, LCSW 03/31/2024

## 2024-03-31 NOTE — Psych (Signed)
 [facility] PHP THERAPIST PROGRESS NOTE  Chivas Notz [id number]   Session Time: 9:00 am - 10:00 am  Participation Level: Active  Behavioral Response: CasualAlert and DrowsyAnxious and Depressed  Type of Therapy: Group Therapy  Treatment Goals addressed: Coping  Progress Towards Goals: Progressing  Interventions: CBT, DBT, Solution Focused, Strength-based, Supportive, and Reframing  Therapist Response: Clinician led check-in regarding current stressors and situation, and review of patient completed daily inventory. Clinician utilized active listening and empathetic response and validated patient emotions. Clinician facilitated processing group on pertinent issues.?   Summary: Patient arrived within time allowed. Patient rates his depression at a 6 and anxiety at an 8 on a scale of 1-10 with 10 being best. Pt reports he continues to experience increased anxiety and shares that he coped with this yesterday. He states he is experience fatigue due to constant worrying and rumination. When asked about sleep and appetite, pt reports he slept 9 hours last night and ate 2 meals yesterday. Pt denied experiencing SI/SH thoughts but endorsed feelings of hopelessness since last session. Pt able to process.?Pt engaged in discussion.?      Session Time: 10:00 am - 11:00 am  Participation Level: Active  Behavioral Response: CasualAlert and DrowsyAnxious and Depressed  Type of Therapy: Group Therapy  Treatment Goals addressed: Coping  Progress Towards Goals: Progressing  Interventions: CBT, DBT, Solution Focused, Strength-based, Supportive, and Reframing  Therapist Response: Clinician led processing group for pt's current struggles. Group members shared stressors and provided support and feedback. Clinician brought in topics of mindfulness and being in the present moment to inform discussion.  Summary: Pt able to process and provide support to group.     Session Time: 11:00 am - 12:00  pm  Participation Level: Active  Behavioral Response: CasualAlert and DrowsyAnxious and Depressed  Type of Therapy: Group Therapy  Treatment Goals addressed: Coping  Progress Towards Goals: Progressing  Interventions: CBT, DBT, Solution Focused, Strength-based, Supportive, and Reframing  Therapist Response: Clinician led discussion on anxiety. Clinician began with psychoeducation explaining the amygdala and the brain's fear response. Clinician provided information about how to decrease the fear response through acceptance and exposure. Clinician then discussed cognitive defusion techniques utilizing a handout which explains different methods for thought defusion. Lastly, patients were asked to demonstrate cognitive defusion using a silly voice.   Summary: Pt engaged in discussion and participated in thought defusion practice.   Session Time: 12:00 pm - 12:30 pm  Participation Level: Active  Behavioral Response: CasualAlert and DrowsyAnxious and Depressed  Type of Therapy: Group Therapy  Treatment Goals addressed: Coping  Progress Towards Goals: Progressing  Interventions: Psychologist, occupational, Supportive  Therapist Response: Reflection Group: Patients encouraged to practice skills and interpersonal techniques or work on mindfulness and relaxation techniques. The importance of self-care and making skills part of a routine to increase usage were stressed.  Summary: Patient engaged and participated appropriately.   Session Time: 12:30 pm - 1:30 pm  Participation Level: Active  Behavioral Response: CasualAlert and DrowsyAnxious and Depressed  Type of Therapy: Group Therapy  Treatment Goals addressed: Coping  Progress Towards Goals: Progressing  Interventions: CBT, DBT, Solution Focused, Strength-based, Supportive, and Reframing  Therapist Response: Group was led by occupational therapist, Edward Hollan.   Summary: Pt engaged and participated in  discussion.   Session Time: 1:30 pm - 2:00 pm  Participation Level: Active  Behavioral Response: CasualAlert and DrowsyAnxious and Depressed  Type of Therapy: Group Therapy  Treatment Goals addressed: Coping  Progress Towards Goals:  Progressing  Interventions: CBT, DBT, Solution Focused, Strength-based, Supportive, and Reframing  Therapist Response: 1:30 pm - 1:50 pm: Clinician utilized a guided meditation video led by yoga instructor Adriene of [employer] that focuses on anxiety. Patients were invited to share their responses upon completion. 1:50 - 2:00 pm: Clinician led check-out. Clinician assessed for immediate needs, medication compliance and efficacy, and safety concerns?  Summary: 1:30 pm - 1:50 pm: Pt participated in activity 1:50 - 2:00 pm: At check-out, patient contracts for safety.?Patient demonstrates progress as evidenced by his continued engagement and by being receptive to treatment. Patient denies SI/HI/self-harm thoughts at the end of group and agrees to seek help should those thoughts/feelings occur.?    Suicidal/Homicidal: Nowithout intent/plan  Plan: ?Pt will continue in PHP and medication management while continuing to work on decreasing depression symptoms,?SI, and anxiety symptoms,?and increasing the ability to self manage symptoms.     Collaboration of Care: Other RN Hildegard Macadam  Patient/Guardian was advised Release of Information must be obtained prior to any record release in order to collaborate their care with an outside provider. Patient/Guardian was advised if they have not already done so to contact the registration department to sign all necessary forms in order for us  to release information regarding their care.   Consent: Patient/Guardian gives verbal consent for treatment and assignment of benefits for services provided during this visit. Patient/Guardian expressed understanding and agreed to proceed.   Diagnosis: Bipolar I disorder, most  recent episode depressed (HCC) [F31.30]    1. Bipolar I disorder, most recent episode depressed [facility])       Will LILLETTE Pollack, LCSW 03/31/2024

## 2024-04-01 ENCOUNTER — Ambulatory Visit (INDEPENDENT_AMBULATORY_CARE_PROVIDER_SITE_OTHER): Admitting: Licensed Clinical Social Worker

## 2024-04-01 ENCOUNTER — Ambulatory Visit (HOSPITAL_COMMUNITY)

## 2024-04-01 DIAGNOSIS — F3130 Bipolar disorder, current episode depressed, mild or moderate severity, unspecified: Secondary | ICD-10-CM

## 2024-04-02 ENCOUNTER — Ambulatory Visit (HOSPITAL_COMMUNITY)

## 2024-04-02 DIAGNOSIS — F3130 Bipolar disorder, current episode depressed, mild or moderate severity, unspecified: Secondary | ICD-10-CM | POA: Diagnosis not present

## 2024-04-03 ENCOUNTER — Ambulatory Visit (HOSPITAL_COMMUNITY)

## 2024-04-03 ENCOUNTER — Ambulatory Visit (INDEPENDENT_AMBULATORY_CARE_PROVIDER_SITE_OTHER): Admitting: Licensed Clinical Social Worker

## 2024-04-03 DIAGNOSIS — F3130 Bipolar disorder, current episode depressed, mild or moderate severity, unspecified: Secondary | ICD-10-CM

## 2024-04-03 DIAGNOSIS — R4589 Other symptoms and signs involving emotional state: Secondary | ICD-10-CM

## 2024-04-04 NOTE — Psych (Signed)
 [facility] Alexander Harrington PHP THERAPIST PROGRESS NOTE  Alexander Harrington [id number]  Session Time: 9:00 - 10:00  Participation Level: Active  Behavioral Response: CasualAlertDepressed  Type of Therapy: Group Therapy  Treatment Goals addressed: Coping  Progress Towards Goals: Progressing  Interventions: CBT, DBT, Supportive, and Reframing  Summary: Alexander Harrington is a 33 y.o. male who presents with depression symptoms.  Clinician led check-in regarding current stressors and situation, and review of patient completed daily inventory. Clinician utilized active listening and empathetic response and validated patient emotions. Clinician facilitated processing group on pertinent issues.?   Summary: Patient arrived within time allowed. Patient rates his depression at a 5 and anxiety at a 8 on a scale of 1-10 with 10 being high. Pt reports sleeping 9 hours and eating 2 times yesterday. Pt reports increased anxiety this morning due to waking up late and having nightmares in the night. Pt reports anxiety is also increased due to his court date next week. Pt states he is trying to stay present, but it is hard. Pt denies current SI/HI. Pt able to process.?Pt engaged in discussion.?      Session Time: 10:00 am - 11:00 am  Participation Level: Active  Behavioral Response: CasualAlertAnxious and Depressed  Type of Therapy: Group Therapy  Treatment Goals addressed: Coping  Progress Towards Goals: Progressing  Interventions: CBT, DBT,  Supportive, and Reframing  Therapist Response: Cln led activity on finding what makes you happy. Cln tasked group members to consider times in which they have been happy in the past, moments in which they have felt less distressed recently, and things that give them any rumblings of joy. Cln worked with pt's to process barriers and to consider one thing for now, not the bigger picture.   Summary: Pt engaged in discussion and is able to process and come up with one thing thing to do  to make herself happy.       Session Time: 11:00 am - 12:00 pm  Participation Level: Active  Behavioral Response: CasualAlertAnxious and Depressed  Type of Therapy: Group Therapy  Treatment Goals addressed: Coping  Progress Towards Goals: Progressing  Interventions: CBT, DBT,  Supportive, and Reframing  Therapist Response: Cln led discussion on how to fill unplanned time. Group members shared ways in which downtime negatively impacts mental health and often causes them to dwell on negative thinking. Group brainstormed ways to manage down time and problem solved how to handle barriers.   Summary: Pt engaged in discussion.     Session Time: 12:00 pm - 1:00 pm  Participation Level: Active  Behavioral Response: CasualAlertDepressed  Type of Therapy: Group Therapy  Treatment Goals addressed: Coping  Progress Towards Goals: Progressing  Interventions: OT   Therapist Response: 12:00 pm - 12:50 pm: OT group was led by occupational therapist, Edward Hollan.  12:50 - 1:00 pm: Clinician led check-out. Clinician assessed for immediate needs, medication compliance and efficacy, and safety concerns. Clinician requested patients engage in one activity that they enjoy that they have not done recently.  Summary: 12:00 pm - 12:50 pm: Pt participated and engaged in discussion. 12:50 - 1:00 pm: At check-out, patient contracts for safety.?Patient demonstrates progress as evidenced by continued engagement and responsiveness to treatment. Patient denies SI/HI/self-harm thoughts at the end of group and agrees to seek help should those thoughts/feelings occur.?     Suicidal/Homicidal: Nowithout intent/plan  Plan: Pt will continue in PHP while working to decrease depression symptoms, increase daily functioning, and increase ability to manage symptoms in a healthy manner.  Collaboration of Care: Medication Management AEB T Lewis, NP  Patient/Guardian was advised Release of Information  must be obtained prior to any record release in order to collaborate their care with an outside provider. Patient/Guardian was advised if they have not already done so to contact the registration department to sign all necessary forms in order for us  to release information regarding their care.   Consent: Patient/Guardian gives verbal consent for treatment and assignment of benefits for services provided during this visit. Patient/Guardian expressed understanding and agreed to proceed.   Diagnosis: Bipolar I disorder, most recent episode depressed (HCC) [F31.30]    1. Bipolar I disorder, most recent episode depressed (HCC)       Randall Bastos, LCSW 04/04/2024

## 2024-04-04 NOTE — Psych (Signed)
 [facility] PHP THERAPIST PROGRESS NOTE  Alexander Harrington [id number]  Session Time: 9:00 - 10:00  Participation Level: Active  Behavioral Response: CasualAlertDepressed  Type of Therapy: Group Therapy  Treatment Goals addressed: Coping  Progress Towards Goals: Progressing  Interventions: CBT, DBT, Supportive, and Reframing  Summary: Alexander Harrington is a 33 y.o. male who presents with depression symptoms.  Clinician led check-in regarding current stressors and situation, and review of patient completed daily inventory. Clinician utilized active listening and empathetic response and validated patient emotions. Clinician facilitated processing group on pertinent issues.?   Summary: Patient arrived within time allowed. Patient rates his depression at a 5 and anxiety at a 8 on a scale of 1-10 with 10 being high. Pt reports sleeping 9 hours and eating 2 times yesterday. Pt reports he thinks he has chronic fatigue and is disheartened that he remains low energy. Pt is repetitive in his concerns and struggles with fixation on his depression symptoms and the fact that he is not the same. Pt accepts coaching and reframing from cln and requires the same reframing daily. Pt denies current SI/HI. Pt able to process.?Pt engaged in discussion.?      Session Time: 10:00 am - 11:00 am  Participation Level: Active  Behavioral Response: CasualAlertAnxious and Depressed  Type of Therapy: Group Therapy  Treatment Goals addressed: Coping  Progress Towards Goals: Progressing  Interventions: CBT, DBT,  Supportive, and Reframing  Therapist Response: Cln led discussion on making difficult decisions. Group members discussed decisions they are struggling with. Cln utilized CBT thought challenging and DBT walking the middle path to inform discussion.   Summary: Pt engaged in discussion.           Session Time: 11:00 am - 12:00 pm   Participation Level: Active   Behavioral Response: CasualAlertAnxious  and Depressed   Type of Therapy: Group Therapy   Treatment Goals addressed: Coping   Progress Towards Goals: Progressing   Interventions: CBT, DBT,  Supportive, and Reframing   Therapist Response: Cln led discussion on DBT dialectics and balance. Cln encouraged pt's to utilize AND statements to cue their brain to hold two opposing ideas. Cln provided examples and group members created their own AND statements.   Summary:  Pt engaged in discussion and created AND statements relevant to their current situation.         Session Time: 12:00 pm - 12:30 pm  Participation Level: Active  Behavioral Response: CasualAlertAnxious and Depressed  Type of Therapy: Group Therapy  Treatment Goals addressed: Coping  Progress Towards Goals: Progressing  Interventions: Psychologist, occupational, Supportive  Therapist Response: Reflection Group: Patients encouraged to practice skills and interpersonal techniques or work on mindfulness and relaxation techniques. The importance of self-care and making skills part of a routine to increase usage were stressed.  Summary: Patient engaged and participated appropriately.     Session Time: 12:30 pm - 1:30 pm  Participation Level: Active  Behavioral Response: CasualAlertDepressed  Type of Therapy: Group Therapy  Treatment Goals addressed: Coping  Progress Towards Goals: Progressing  Interventions: Occupational Therapy  Therapist Response: Group was led by occupational therapist, Edward Hollan.   Summary: Pt engaged and participated in discussion.      Session Time: 1:30 pm - 2:00 pm  Participation Level: Active  Behavioral Response: CasualAlertDepressed  Type of Therapy: Group Therapy  Treatment Goals addressed: Coping  Progress Towards Goals: Progressing  Interventions: CBT, DBT,  Supportive, and Reframing  Therapist Response: 1:30 pm - 1:50 pm: Clinician utilized DBT  TIPP skill, Progressive Muscle Relaxation, for  relaxation and managing big feelings. Cln led group through PMR practice and discussed when the skill can be used.  1:50 - 2:00 pm: Clinician led check-out. Clinician assessed for immediate needs, medication compliance and efficacy, and safety concerns. Clinician requested patients engage in one activity that they enjoy that they have not done recently.  Summary: 1:30 pm - 1:50 pm: Pt participated in activity.  1:50 - 2:00 pm: At check-out, patient contracts for safety.?Patient demonstrates progress as evidenced by continued engagement and responsiveness to treatment. Patient denies SI/HI/self-harm thoughts at the end of group and agrees to seek help should those thoughts/feelings occur.    Suicidal/Homicidal: Nowithout intent/plan  Plan: Pt will continue in PHP while working to decrease depression symptoms, increase daily functioning, and increase ability to manage symptoms in a healthy manner.   Collaboration of Care: Medication Management AEB T Lewis, NP  Patient/Guardian was advised Release of Information must be obtained prior to any record release in order to collaborate their care with an outside provider. Patient/Guardian was advised if they have not already done so to contact the registration department to sign all necessary forms in order for us  to release information regarding their care.   Consent: Patient/Guardian gives verbal consent for treatment and assignment of benefits for services provided during this visit. Patient/Guardian expressed understanding and agreed to proceed.   Diagnosis: Bipolar I disorder, most recent episode depressed (HCC) [F31.30]    1. Bipolar I disorder, most recent episode depressed (HCC)       Randall Bastos, LCSW 04/04/2024

## 2024-04-04 NOTE — Psych (Signed)
  [facility] PHP THERAPIST PROGRESS NOTE  [name] [id number]  Session Time: 9:00 - 10:00  Participation Level: Active  Behavioral Response: CasualAlertDepressed  Type of Therapy: Group Therapy  Treatment Goals addressed: Coping  Progress Towards Goals: Progressing  Interventions: CBT, DBT, Supportive, and Reframing  Summary: Alexander Harrington is a 33 y.o. male who presents with depression symptoms.  Clinician led check-in regarding current stressors and situation, and review of patient completed daily inventory. Clinician utilized active listening and empathetic response and validated patient emotions. Clinician facilitated processing group on pertinent issues.?   Summary: Patient arrived within time allowed. Patient rates his depression at a 5 and anxiety at a 6 on a scale of 1-10 with 10 being high. Pt reports sleeping 12 hours and eating 2 times yesterday. Pt reports continued fatigue and that it is worsening as the week progresses. Pt states he had a migraine yesterday and a 4 hour nap after group.  Pt states he had had plans with a friend but slept through them. Pt expresses desire to be more present and not in his head. Pt denies current SI/HI. Pt able to process.?Pt engaged in discussion.?       Session Time: 10:00 am - 11:00 am  Participation Level: Active  Behavioral Response: CasualAlertAnxious and Depressed  Type of Therapy: Group Therapy  Treatment Goals addressed: Coping  Progress Towards Goals: Progressing  Interventions: CBT, DBT,  Supportive, and Reframing  Therapist Response: Cln led processing group for pt's current struggles. Group members shared stressors and provided support and feedback. Cln brought in topics of boundaries, healthy relationships, and unhealthy thought processes to inform discussion.   Summary: Pt able to process and provide support to group.     **Pt chose to leave group at 11 stating physical symptoms of a congested ear that are  bothering him. Pt states he will reach out to his PCP or go to urgent care if it becomes worse. Pt denies SI/HI before leaving group.     Suicidal/Homicidal: Nowithout intent/plan  Plan: Pt will continue in PHP while working to decrease depression symptoms, increase daily functioning, and increase ability to manage symptoms in a healthy manner.   Collaboration of Care: Medication Management AEB T Lewis, NP  Patient/Guardian was advised Release of Information must be obtained prior to any record release in order to collaborate their care with an outside provider. Patient/Guardian was advised if they have not already done so to contact the registration department to sign all necessary forms in order for us  to release information regarding their care.   Consent: Patient/Guardian gives verbal consent for treatment and assignment of benefits for services provided during this visit. Patient/Guardian expressed understanding and agreed to proceed.   Diagnosis: Bipolar I disorder, most recent episode depressed (HCC) [F31.30]    1. Bipolar I disorder, most recent episode depressed (HCC)       Randall Bastos, LCSW 04/04/2024

## 2024-04-05 DIAGNOSIS — F332 Major depressive disorder, recurrent severe without psychotic features: Secondary | ICD-10-CM | POA: Diagnosis not present

## 2024-04-06 ENCOUNTER — Ambulatory Visit (HOSPITAL_COMMUNITY)

## 2024-04-07 ENCOUNTER — Ambulatory Visit (HOSPITAL_COMMUNITY)

## 2024-04-08 ENCOUNTER — Ambulatory Visit (HOSPITAL_COMMUNITY)

## 2024-04-09 ENCOUNTER — Ambulatory Visit (HOSPITAL_COMMUNITY)

## 2024-04-10 ENCOUNTER — Ambulatory Visit (HOSPITAL_COMMUNITY)

## 2024-04-12 DIAGNOSIS — F332 Major depressive disorder, recurrent severe without psychotic features: Secondary | ICD-10-CM | POA: Diagnosis not present

## 2024-04-13 ENCOUNTER — Ambulatory Visit (HOSPITAL_COMMUNITY)

## 2024-04-13 ENCOUNTER — Ambulatory Visit (INDEPENDENT_AMBULATORY_CARE_PROVIDER_SITE_OTHER): Admitting: Licensed Clinical Social Worker

## 2024-04-13 DIAGNOSIS — F3130 Bipolar disorder, current episode depressed, mild or moderate severity, unspecified: Secondary | ICD-10-CM | POA: Diagnosis not present

## 2024-04-13 DIAGNOSIS — R4589 Other symptoms and signs involving emotional state: Secondary | ICD-10-CM
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
## 2024-04-14 ENCOUNTER — Ambulatory Visit (INDEPENDENT_AMBULATORY_CARE_PROVIDER_SITE_OTHER): Admitting: Licensed Clinical Social Worker

## 2024-04-14 ENCOUNTER — Ambulatory Visit (HOSPITAL_COMMUNITY)

## 2024-04-14 DIAGNOSIS — F3130 Bipolar disorder, current episode depressed, mild or moderate severity, unspecified: Secondary | ICD-10-CM

## 2024-04-14 NOTE — Psych (Signed)
  [facility] PHP THERAPIST PROGRESS NOTE  Alexander Harrington [id number]   Session Time: 9:00 am - 9:45 am  Participation Level: Active  Behavioral Response: CasualAlertAnxious and Depressed  Type of Therapy: Group Therapy  Treatment Goals addressed: Coping  Progress Towards Goals: Progressing  Interventions: CBT, DBT, Solution Focused, Strength-based, Supportive, and Reframing  Therapist Response: Clinician led check-in regarding current stressors and situation, and review of patient completed daily inventory. Clinician utilized active listening and empathetic response and validated patient emotions. Clinician facilitated processing group on pertinent issues.?   Summary: Patient arrived within time allowed. Patient rates their depression at a 4 and anxiety at a 7 on a scale of 1-10 with 10 being best. Pt reported he is feeling nauseated today and after speaking with NP, reports she suspects it is a medication side effect. He states she instructed him to taper off one of his medications and see his PCP. When asked about sleep and appetite, pt reports he slept 6 hours last night and ate 1 meal yesterday. Pt requested to discuss managing anxiety and is also requesting resources for another ERP therapist. Cln agreed to email contact info for [facility] and advised pt it is self-pay, to which pt was agreeable. Pt denied experiencing SI/SH thoughts and feelings of hopelessness since last session. Pt able to process.?Pt engaged in discussion.?      Session Time: 9:45 am - 11:00 am  Participation Level: Active  Behavioral Response: CasualAlertAnxious and Depressed  Type of Therapy: Group Therapy  Treatment Goals addressed: Coping  Progress Towards Goals: Progressing  Interventions: CBT, DBT, Solution Focused, Strength-based, Supportive, and Reframing  Therapist Response: Clinician led discussion on anxiety. Clinician began with psychoeducation explaining the amygdala and the  brain's fear response, as well as explaining the evolutionary purpose of physical symptoms. Clinician provided information about how to decrease the fear response through acceptance and exposure. Clinician also provided information about interoceptive exercises and invited patients to participate in one based on which physical symptoms they find the most distressing. After participating in the exercise, clinician acknowledged the symptoms as being present and uncomfortable, but not dangerous.   Summary: Pt engaged in discussion and participated in interoceptive exercise. Cln and pt ran in place until out of breath and with increased heart rate to simulate panic and/or heightened anxiety. Pt demonstrated understanding of the exercise and how it applies to anxiety management. Pt requested to leave at 11 am due to not feeling well, and clinician discussed BRAT diet for nausea.   Suicidal/Homicidal: Nowithout intent/plan  Plan: ?Pt will continue in PHP and medication management while continuing to work on decreasing depression symptoms,?SI, and anxiety symptoms,?and increasing the ability to self manage symptoms.     Collaboration of Care: Medication Management AEB Staci Kerns, NP  Patient/Guardian was advised Release of Information must be obtained prior to any record release in order to collaborate their care with an outside provider. Patient/Guardian was advised if they have not already done so to contact the registration department to sign all necessary forms in order for us  to release information regarding their care.   Consent: Patient/Guardian gives verbal consent for treatment and assignment of benefits for services provided during this visit. Patient/Guardian expressed understanding and agreed to proceed.   Diagnosis: Bipolar I disorder, most recent episode depressed (HCC) [F31.30]    1. Bipolar I disorder, most recent episode depressed [facility])      Will LILLETTE Pollack, LCSW 04/14/2024

## 2024-04-14 NOTE — Progress Notes (Signed)
 [facility] MD/PA/NP OP Progress Note  04/14/2024 10:57 AM Alexander Harrington  MRN:  [id number]  Chief Complaint: Weekly follow-up progress assessment  HPI: Alexander Harrington 33 year old male was seen and evaluated face-to-face by this provider.  He reports overall his mood has improved.  Patient reports reported he attended court case in [place] .  States he will be placed on probation so the outcome was  great news.  States he and his girlfriend had rekindled their relationship as they had a no contact order until the court case will resolved.  States she continues to be supportive and understanding during the situation.  He denied suicidal or homicidal ideations.  Denies auditory visual hallucinations.  Alexander Harrington related to current medication regimen, as he reports he was recently seen and evaluated by urgent care due to urinary retention and dizziness.  States was suggested that current medications may be the issue.  He reports his symptoms started roughly 2 weeks prior.  Reports some Harrington related to dizziness and ear fullness.  Patient reports  I think I may be getting a migraine.    Currently he is prescribed Remeron, olanzapine, Klonopin , prazosin and Lybalvia.  Discussed consideration for titrating prazosin 3 mg to 2 mg.  He appeared receptive to plan.  Continue to monitor symptoms.  No Harrington related to self injures behaviors does continue to endorse increased anxiety.  Will need to follow-up for lithium  level.  Support and encouragement reassurance was provided.  Visit Diagnosis:    ICD-10-CM   1. Bipolar I disorder, most recent episode depressed (HCC)  F31.30       Past Psychiatric History:   Past Medical History:  Past Medical History:  Diagnosis Date   ADHD    Anxiety    Dr. [name]   Bipolar 2 disorder (HCC)    Depression    HLD (hyperlipidemia)    Insomnia    Sleep apnea    do not use CPAP    Past Surgical History:  Procedure Laterality Date   DRUG INDUCED ENDOSCOPY N/A  08/28/2022   Procedure: DRUG INDUCED ENDOSCOPY;  Surgeon: Carlie Clark, MD;  Location: [facility];  Service: ENT;  Laterality: N/A;   OTHER SURGICAL HISTORY     sedated ect treatment   TONSILLECTOMY Bilateral 11/07/2022   Procedure: TONSILLECTOMY;  Surgeon: Carlie Clark, MD;  Location: [facility] OR;  Service: ENT;  Laterality: Bilateral;    Family Psychiatric History:   Family History:  Family History  Problem Relation Age of Onset   Arthritis Mother    Mental illness Mother    Depression Mother    Anxiety disorder Mother    Drug abuse Maternal Grandfather    Alcohol  abuse Maternal Grandfather    Drug abuse Maternal Grandmother    Alcohol  abuse Maternal Grandmother     Social History:  Social History   Socioeconomic History   Marital status: Single    Spouse name: Not on file   Number of children: 0   Years of education: Not on file   Highest education level: Bachelor's degree (e.g., BA, AB, BS)  Occupational History   Occupation: student  Tobacco Use   Smoking status: Never   Smokeless tobacco: Never   Tobacco comments:    Patient does vape occasionally.  No nicotine   Vaping Use   Vaping status: Never Used  Substance and Sexual Activity   Alcohol  use: Yes    Alcohol /week: 2.0 - 4.0 standard drinks of alcohol     Types: 2 - 4  Cans of beer per week    Comment: weekly   Drug use: Yes    Frequency: 1.0 times per week    Types: Marijuana    Comment: Last use 11/05/22   Sexual activity: Not Currently    Birth control/protection: Condom  Other Topics Concern   Not on file  Social History Narrative   Caffienated drinks-no   Seat belt use often-yes   Regular Exercise-no   Smoke alarm in the home-yes   Firearms/guns in the home-no   History of physical abuse-no      04/08/2013 AHW [name] was born in [place], [place] , and grew up in [place], [place] . He is an only child, and reports that his childhood was really good. 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 He lives off campus with roommates. He denies any legal problems. He reports that he is spiritual but acting not stick. His hobbies include watching movies, reading, and writing. His social support system consists of his father, mother, friends, and cousins. 04/08/2013 AHW            Social Drivers of Health   Financial Resource Strain: Low Risk  (09/14/2023)   Overall Financial Resource Strain (CARDIA)    Difficulty of Paying Living Expenses: Not hard at all  Food Insecurity: No Food Insecurity (09/14/2023)   Hunger Vital Sign    Worried About Running Out of Food in the Last Year: Never true    Ran Out of Food in the Last Year: Never true  Transportation Needs: Unmet Transportation Needs (09/14/2023)   PRAPARE - Transportation    Lack of Transportation (Medical): Yes    Lack of Transportation (Non-Medical): Yes  Physical Activity: Insufficiently Active (09/14/2023)   Exercise Vital Sign    Days of Exercise per Week: 3 days    Minutes of Exercise per Session: 30 min  Stress: Stress Concern Present (09/14/2023)   [employer] of Occupational Health - Occupational Stress Questionnaire    Feeling of Stress : Very much  Social Connections: Unknown (09/14/2023)   Social Connection and Isolation Panel    Frequency of Communication with Friends and Family: More than three times a week    Frequency of Social Gatherings with Friends and Family: Three times a week    Attends Religious Services: Never    Active Member of Clubs or Organizations: No    Attends Banker Meetings: Not on file    Marital Status: Patient declined    Allergies:  Allergies  Allergen Reactions   Codeine Rash    Metabolic Disorder Labs: Lab Results  Component Value Date   HGBA1C 4.9 09/18/2023   No results found for: PROLACTIN Lab Results  Component Value Date   CHOL 210 (H) 09/18/2023   TRIG 231.0 (H) 09/18/2023   HDL 40.90  09/18/2023   CHOLHDL 5 09/18/2023   VLDL 46.2 (H) 09/18/2023   LDLCALC 123 (H) 09/18/2023   LDLCALC 163 (H) 11/09/2014   Lab Results  Component Value Date   TSH 0.98 05/01/2023   TSH 1.39 06/04/2022    Therapeutic Level Labs: Lab Results  Component Value Date   LITHIUM  0.4 (L) 09/18/2023   LITHIUM  0.4 (L) 05/01/2023   No results found for: VALPROATE No results found for: CBMZ  Current Medications: Current Outpatient Medications  Medication Sig Dispense Refill   baclofen  (LIORESAL ) 10 MG tablet Take 1 tablet (10 mg total) by mouth 3 (three) times  daily. (Patient not taking: Reported on 03/31/2024) 30 each 0   clonazePAM  (KLONOPIN ) 1 MG tablet Take 1 mg by mouth 3 (three) times daily as needed for anxiety.     eszopiclone (LUNESTA) 2 MG TABS tablet Take 2 mg by mouth at bedtime as needed for sleep. Take immediately before bedtime (Patient not taking: Reported on 03/31/2024)     ibuprofen  (ADVIL ) 600 MG tablet Take 1 tablet (600 mg total) by mouth every 6 (six) hours as needed. 30 tablet 0   lithium  600 MG capsule Take 600 mg by mouth at bedtime. Taking 1200mg      LYBALVI 20-10 MG TABS Take 1 tablet by mouth at bedtime.     mirtazapine (REMERON) 15 MG tablet Take 15 mg by mouth at bedtime.     OLANZapine (ZYPREXA) 5 MG tablet Take 5 mg by mouth every morning.     prazosin (MINIPRESS) 1 MG capsule Take 1 mg by mouth at bedtime.     rizatriptan  (MAXALT ) 10 MG tablet Take 1 tablet (10 mg total) by mouth as needed for migraine. May repeat in 2 hours if needed 10 tablet 0   rosuvastatin  (CRESTOR ) 10 MG tablet Take 1 tablet (10 mg total) by mouth daily. 90 tablet 3   Suvorexant (BELSOMRA PO) Take by mouth. (Patient not taking: Reported on 03/31/2024)     VYVANSE 30 MG capsule Take 30 mg by mouth daily. (Patient not taking: Reported on 03/31/2024)     No current facility-administered medications for this visit.     Musculoskeletal: Strength & Muscle Tone: within normal limits Gait &  Station: normal Patient leans: N/A  Psychiatric Specialty Exam: Review of Systems  There were no vitals taken for this visit.There is no height or weight on file to calculate BMI.  General Appearance: Casual  Eye Contact:  Good intense staring  Speech:  Clear and Coherent  Volume:  Normal  Mood:  Anxious and Depressed  Affect:  Congruent  Thought Process:  Coherent  Orientation:  Full (Time, Place, and Person)  Thought Content: Logical   Suicidal Thoughts:  No  Homicidal Thoughts:  No  Memory:  Immediate;   Good Recent;   Good  Judgement:  Good  Insight:  Good  Psychomotor Activity:  Normal  Concentration:  Concentration: Good  Recall:  Good  Fund of Knowledge: Good  Language: Good  Akathisia:  No  Handed:  Right  AIMS (if indicated): 0  Assets:  Desire for Improvement Social Support  ADL's:  Intact  Cognition: WNL  Sleep:  Good   Screenings: AUDIT    Flowsheet Row Office Visit from 09/18/2023 in [facility] Office Visit from [facility]01/2023 [facility]  Alcohol  Use Disorder Identification Test Final Score (AUDIT) 3  5    ECT-MADRS    Flowsheet Row ECT Treatment from 06/24/2017 in [facility] ECT Treatment from 06/03/2017 in [facility] ECT Treatment from 05/27/2017 in [facility]  MADRS Total Score 27 23 35   GAD-7    Flowsheet Row Counselor from 03/30/2024 in [facility] Counselor from 03/24/2024 in [facility] Counselor from 08/16/2017 in [facility] Counselor from 07/25/2017 in [facility]  Total GAD-7 Score 18 17 14 19    Mini-Mental    Flowsheet Row ECT Treatment from 06/24/2017  in [facility] ECT Treatment from 05/27/2017  in [facility]  Total Score (max 30 points ) 29 30   PHQ2-9    Flowsheet Row Counselor from 03/31/2024 in [facility] Counselor from 03/30/2024 in [facility] Counselor from 03/24/2024 in [facility] Counselor from 03/10/2024 in [facility] Counselor from 11/22/2022 in [facility] PROGRAM  PHQ-2 Total Score 5 5 6 5 5   PHQ-9 Total Score 20 23 23 23 22    Flowsheet Row Counselor from 03/10/2024 in [facility] UC from 11/11/2023 in [facility] Counselor from 11/22/2022 in [facility] PART[facility]OSPITALIZATION PROGRAM  C-SSRS RISK CATEGORY Moderate Risk No Risk High Risk     Assessment and Plan:  Continue partial hospitalization [facility] - Decrease prazosin 3 mg to 2 mg nightly  Collaboration of Care: Collaboration of Care: Medication Management AEB continue to monitor symptoms  Patient/Guardian was advised Release of Information must be obtained prior to any record release in order to collaborate their care with an outside provider. Patient/Guardian was advised if they have not already done so to contact the registration department to sign all necessary forms in order for us  to release information regarding their care.   Consent: Patient/Guardian gives verbal consent for treatment and assignment of benefits for services provided during this visit. Patient/Guardian expressed understanding and agreed to proceed.    Staci LOISE Kerns, NP 04/14/2024, 10:57 AM

## 2024-04-15 ENCOUNTER — Ambulatory Visit (HOSPITAL_COMMUNITY)

## 2024-04-15 ENCOUNTER — Ambulatory Visit (INDEPENDENT_AMBULATORY_CARE_PROVIDER_SITE_OTHER): Admitting: Licensed Clinical Social Worker

## 2024-04-15 DIAGNOSIS — F3130 Bipolar disorder, current episode depressed, mild or moderate severity, unspecified: Secondary | ICD-10-CM | POA: Diagnosis not present

## 2024-04-15 NOTE — Progress Notes (Signed)
 Spoke with patient in person for weekly check in for PHP. States that groups are going well. Feels a lot of day time sleepiness and believes Remeron is the cause. He did not take it for the past 2 nights and has been better. Also has nausea, weakness, feels dizzy, has an urgency to urinate but can't and light headed. Believes he is coming down with some sickness and went to urgent care yesterday. They did not find anything out of the ordinary. Also has tremors in his hands that he believes is from Lithium . He is developing a headache and would like a refill of Maxalt . Message sent to NP to discuss medications. On scale 1-10 as 10 being worst he rates depression at 5 and anxiety at 7. Denies SI/HI or AVH. No other issues or complaints. He did discuss that he believes his brain is still trying to heal from his psychotic break. Sometimes he has trouble forming sentences and has to think hard before he speaks.

## 2024-04-16 ENCOUNTER — Ambulatory Visit (INDEPENDENT_AMBULATORY_CARE_PROVIDER_SITE_OTHER)

## 2024-04-16 ENCOUNTER — Ambulatory Visit (HOSPITAL_COMMUNITY)

## 2024-04-16 DIAGNOSIS — R4589 Other symptoms and signs involving emotional state: Secondary | ICD-10-CM

## 2024-04-16 DIAGNOSIS — F3130 Bipolar disorder, current episode depressed, mild or moderate severity, unspecified: Secondary | ICD-10-CM

## 2024-04-17 ENCOUNTER — Ambulatory Visit (HOSPITAL_COMMUNITY)

## 2024-04-17 ENCOUNTER — Ambulatory Visit (INDEPENDENT_AMBULATORY_CARE_PROVIDER_SITE_OTHER): Admitting: Licensed Clinical Social Worker

## 2024-04-17 DIAGNOSIS — F3130 Bipolar disorder, current episode depressed, mild or moderate severity, unspecified: Secondary | ICD-10-CM | POA: Diagnosis not present

## 2024-04-17 DIAGNOSIS — R4589 Other symptoms and signs involving emotional state: Secondary | ICD-10-CM

## 2024-04-20 ENCOUNTER — Encounter (HOSPITAL_COMMUNITY): Payer: Self-pay

## 2024-04-20 ENCOUNTER — Ambulatory Visit (INDEPENDENT_AMBULATORY_CARE_PROVIDER_SITE_OTHER): Admitting: Licensed Clinical Social Worker

## 2024-04-20 ENCOUNTER — Ambulatory Visit (HOSPITAL_COMMUNITY)

## 2024-04-20 DIAGNOSIS — F3130 Bipolar disorder, current episode depressed, mild or moderate severity, unspecified: Secondary | ICD-10-CM

## 2024-04-20 DIAGNOSIS — R4589 Other symptoms and signs involving emotional state: Secondary | ICD-10-CM

## 2024-04-20 NOTE — Therapy (Signed)
 [facility] [address] [place], [place], [address] Phone: [phone]   Fax:  [phone]  Occupational Therapy Treatment  Patient Details  Name: Alexander Harrington MRN: [id number] Date of Birth: 11/18/1990 No data recorded  Encounter Date: 03/26/2024   OT End of Session - 04/20/24 2122     Visit Number 3    Number of Visits 20    Date for Recertification  04/20/24    OT Start Time 1230    OT Stop Time 1330    OT Time Calculation (min) 60 min          Past Medical History:  Diagnosis Date   ADHD    Anxiety    Dr. [name]   Bipolar 2 disorder (HCC)    Depression    HLD (hyperlipidemia)    Insomnia    Sleep apnea    do not use CPAP    Past Surgical History:  Procedure Laterality Date   DRUG INDUCED ENDOSCOPY N/A 08/28/2022   Procedure: DRUG INDUCED ENDOSCOPY;  Surgeon: Carlie Clark, MD;  Location: [facility];  Service: ENT;  Laterality: N/A;   OTHER SURGICAL HISTORY     sedated ect treatment   TONSILLECTOMY Bilateral 11/07/2022   Procedure: TONSILLECTOMY;  Surgeon: Carlie Clark, MD;  Location: [facility] OR;  Service: ENT;  Laterality: Bilateral;    There were no vitals filed for this visit.   Subjective Assessment - 04/20/24 2121     Currently in Pain? No/denies    Pain Score 0-No pain            Group Session:  S: Doing a bit better I think.   O: The primary objective of this topic is to explore and understand the concept of occupational balance in the context of daily living. The term occupational balance is defined broadly, encompassing all activities that occupy an individual's time and energy, including self-care, leisure, and work-related tasks. The goal is to guide participants towards achieving a harmonious blend of these activities, tailored to their personal values and life circumstances. This balance is aimed at enhancing overall well-being, not by equally distributing time across activities, but by  ensuring that daily engagements are fulfilling and not draining. The content delves into identifying various barriers that individuals face in achieving occupational balance, such as overcommitment, misaligned priorities, external pressures, and lack of effective time management. The impact of these barriers on occupational performance, roles, and lifestyles is examined, highlighting issues like reduced efficiency, strained relationships, and potential health problems. Strategies for cultivating occupational balance are a key focus. These strategies include practical methods like time blocking, prioritizing tasks, establishing self-care rituals, decluttering, connecting with nature, and engaging in reflective practices. These approaches are designed to be adaptable and applicable to a wide range of life scenarios, promoting a proactive and mindful approach to daily living. The overall aim is to equip participants with the knowledge and tools to create a balanced lifestyle that supports their mental, emotional, and physical health, thereby improving their functional performance in daily life.   A:  The patient demonstrated a high level of engagement and active participation throughout the session on occupational balance. The patient frequently contributed to discussions, offering insightful reflections on personal experiences related to the barriers and strategies for achieving occupational balance. There was a clear understanding of the concept and an ability to relate it to their own life. The patient showed enthusiasm in learning and applying the strategies discussed, such as time blocking and  self-care rituals, indicating a strong motivation to improve their occupational balance. The patient's proactive approach and responsiveness to the topic suggest a high potential for implementing these strategies effectively in their daily routine.    P: Continue to attend PHP OT group sessions 5x week for 4 weeks  to promote daily structure, social engagement, and opportunities to develop and utilize adaptive strategies to maximize functional performance in preparation for safe transition and integration back into school, work, and the community. Plan to address topic of tbd in next OT group session.                    OT Education - 04/20/24 2121     Education Details Occupational Balance           OT Short Term Goals - 04/20/24 0952       OT SHORT TERM GOAL #1   Title Patient will be educated on strategies to improve psychosocial skills needed to participate fully in all daily, work, and leisure activities.    Time 4    Period Weeks    Status On-going    Target Date 08/15/17      OT SHORT TERM GOAL #2   Title Patient will be educated on a HEP and independent with implementation of HEP.    Status On-going      OT SHORT TERM GOAL #3   Title Patient will independently apply psychosocial skills and coping mechanisms to her daily activities in order to function independently.    Status On-going                   Plan - 04/20/24 2122     Cognitive Skills Attention    Psychosocial Skills Coping Strategies;Habits;Interpersonal Interaction;Routines and Behaviors          Patient will benefit from skilled therapeutic intervention in order to improve the following deficits and impairments:     Cognitive Skills: Attention Psychosocial Skills: Coping Strategies, Habits, Interpersonal Interaction, Routines and Behaviors   Visit Diagnosis: Difficulty coping    Problem List Patient Active Problem List   Diagnosis Date Noted   Migraine 01/06/2024   Suicide attempt (HCC) 11/17/2022   OSA (obstructive sleep apnea) 11/07/2022   Hyperlipidemia 06/03/2020   Bipolar I disorder, most recent episode depressed (HCC) 08/07/2017    Class: Chronic   Hoarseness of voice 10/04/2015   Tachycardia 08/30/2015   Leukocytosis 02/08/2015   Other specified hypothyroidism  02/08/2015   MDD (major depressive disorder), recurrent episode, severe (HCC) 12/01/2013   Substance induced mood disorder (HCC) 04/11/2012    Class: Acute   Severe episode of recurrent major depressive disorder (HCC) 04/09/2012    Class: Acute   Keratosis pilaris 02/21/2011    [place] KANDICE Purpura, OT 04/20/2024, 9:24 PM  [name], OT   [facility] [address] [place], [place], [address] Phone: [phone]   Fax:  [phone]  Name: Alexander Harrington MRN: [id number] Date of Birth: 1991/04/28

## 2024-04-20 NOTE — Progress Notes (Signed)
  [facility] Partial Outpatient Program[facility] Discharge Summary  Alexander Harrington [id number]  Admission date: 03/24/2024 Discharge date: 04/20/2024  Reason for admission: Per admission assessment note:  Alexander Harrington [facility] 33 year old male presents after referral by [facility] urgent care.  He states he has a history related to bipolar disorder, posttraumatic stress disorder, attention deficit disorder, major depressive disorder and obsessive-compulsive disorder.  States he is currently followed by [facility]. Amy Main.   States he is currently prescribed Olanzapine, Lybalvi, Remeron and Klonopin .   Progress in Program Toward Treatment Goals: Progressing patient attended participated with daily group session with active engaged participation.  Denying any suicidal or homicidal ideations.  Denies auditory visual hallucinations.  Discussed titrating prazosin from 3 mg to 2 mg due to reported symptoms of medications.  Currently he is prescribed Klonopin , olanzapine, Lybalvi and Klonopin  reported myriad of symptoms related to medications.  States stomach cramps, nausea and urinary symptoms which SMR resolved.  He has outpatient follow-up with his prescribing provider this week.  Denied any medication refills at discharge.  Support encouragement reassurance was provided.  Progress (rationale): Keep all outpatient follow-up appointment with previous providers states he has decided to increase his trauma-based therapist appointments to biweekly  Collaboration of Care: Medication Management AEB continue medications as directed  Patient/Guardian was advised Release of Information must be obtained prior to any record release in order to collaborate their care with an outside provider. Patient/Guardian was advised if they have not already done so to contact the registration department to sign all necessary forms in order for us  to release information regarding  their care.   Consent: Patient/Guardian gives verbal consent for treatment and assignment of benefits for services provided during this visit. Patient/Guardian expressed understanding and agreed to proceed.    Staci Kerns NP 04/20/2024

## 2024-04-20 NOTE — Therapy (Signed)
 [facility] [address] [place], [place], [address] Phone: [phone]   Fax:  [phone]  Occupational Therapy Evaluation  Patient Details  Name: Alexander Harrington MRN: [id number] Date of Birth: 1991-01-02 No data recorded  Encounter Date: 03/24/2024   OT End of Session - 04/20/24 0948     Visit Number 1    Number of Visits 20    Date for Recertification  05/20/24    OT Start Time 1230    OT Stop Time 1330    OT Time Calculation (min) 60 min    Activity Tolerance Patient tolerated treatment well    Behavior During Therapy [facility] for tasks assessed/performed          Past Medical History:  Diagnosis Date   ADHD    Anxiety    Dr. [name]   Bipolar 2 disorder (HCC)    Depression    HLD (hyperlipidemia)    Insomnia    Sleep apnea    do not use CPAP    Past Surgical History:  Procedure Laterality Date   DRUG INDUCED ENDOSCOPY N/A 08/28/2022   Procedure: DRUG INDUCED ENDOSCOPY;  Surgeon: Carlie Clark, MD;  Location: [facility];  Service: ENT;  Laterality: N/A;   OTHER SURGICAL HISTORY     sedated ect treatment   TONSILLECTOMY Bilateral 11/07/2022   Procedure: TONSILLECTOMY;  Surgeon: Carlie Clark, MD;  Location: [facility] OR;  Service: ENT;  Laterality: Bilateral;    There were no vitals filed for this visit.   Subjective Assessment - 04/20/24 0940     Subjective  I hope to work on new coping skills, ways to improve my attention and concentrtion so I can read and write again    Pertinent History MDD, GAD    Patient Stated Goals improve attention concentration to improve time spent in reading, reading comprehension    Currently in Pain? No/denies    Pain Score 0-No pain          OT Assessment   OCAIRS Mental Health Interview Summary of Client Scores:  Facilitates participation in occupation Allows participation in occupation Inhibits participation in occupation Restricts participation in occupation Comments:  Roles   X     Habits   X    Personal Causation   X    Values  X     Interests    X   Skills   X    Short-Term Goals   X    Long-term Goals   X    Interpretation of Past Experiences  X     Physical Environment  X     Social Environment   X    Readiness for Change  X       Need for Occupational Therapy:  4 Shows positive occupational participation, no need for OT.   3 Need for minimal intervention/consultative participation   2 Need for OT intervention indicated to restore/improve participation   1 Need for extensive OT intervention indicated to improve participation.  Referral for follow up services also recommended.   Assessment:  Patient demonstrates behavior that INHIBITS participation in occupation.  Patient will benefit from occupational therapy intervention in order to improve time management, financial management, stress management, job readiness skills, social skills, and health management skills in preparation to return to full time community living and to be a productive community member.    Plan:  Patient will participate in skilled occupational therapy sessions individually or in a group setting to  improve coping skills, psychosocial skills, and emotional skills required to return to prior level of function. Treatment will be 4-5 times per week for 3 weeks.     Group Session:  O: The primary objective of this topic is to explore and understand the concept of occupational balance in the context of daily living. The term occupational balance is defined broadly, encompassing all activities that occupy an individual's time and energy, including self-care, leisure, and work-related tasks. The goal is to guide participants towards achieving a harmonious blend of these activities, tailored to their personal values and life circumstances. This balance is aimed at enhancing overall well-being, not by equally distributing time across activities, but by ensuring that daily engagements are fulfilling  and not draining. The content delves into identifying various barriers that individuals face in achieving occupational balance, such as overcommitment, misaligned priorities, external pressures, and lack of effective time management. The impact of these barriers on occupational performance, roles, and lifestyles is examined, highlighting issues like reduced efficiency, strained relationships, and potential health problems. Strategies for cultivating occupational balance are a key focus. These strategies include practical methods like time blocking, prioritizing tasks, establishing self-care rituals, decluttering, connecting with nature, and engaging in reflective practices. These approaches are designed to be adaptable and applicable to a wide range of life scenarios, promoting a proactive and mindful approach to daily living. The overall aim is to equip participants with the knowledge and tools to create a balanced lifestyle that supports their mental, emotional, and physical health, thereby improving their functional performance in daily life.   A:  The patient demonstrated a high level of engagement and active participation throughout the session on occupational balance. The patient frequently contributed to discussions, offering insightful reflections on personal experiences related to the barriers and strategies for achieving occupational balance. There was a clear understanding of the concept and an ability to relate it to their own life. The patient showed enthusiasm in learning and applying the strategies discussed, such as time blocking and self-care rituals, indicating a strong motivation to improve their occupational balance. The patient's proactive approach and responsiveness to the topic suggest a high potential for implementing these strategies effectively in their daily routine.                         OT Education - 04/20/24 0947     Education Details OT Eval and  Occupational Balance    Person(s) Educated Patient    Methods Handout;Explanation    Comprehension Verbalized understanding           OT Short Term Goals - 04/20/24 0952       OT SHORT TERM GOAL #1   Title Patient will be educated on strategies to improve psychosocial skills needed to participate fully in all daily, work, and leisure activities.    Time 4    Period Weeks    Status On-going    Target Date 08/15/17      OT SHORT TERM GOAL #2   Title Patient will be educated on a HEP and independent with implementation of HEP.    Status On-going      OT SHORT TERM GOAL #3   Title Patient will independently apply psychosocial skills and coping mechanisms to her daily activities in order to function independently.    Status On-going                   Plan - 04/20/24 9050  Clinical Impression Statement pt presents w/ deficits across multiple psychosocial domains to the extent that these deficits INHIBIT participation in daily activities and overall occupational performance    OT Occupational Profile and History Problem Focused Assessment - Including review of records relating to presenting problem    Occupational performance deficits (Please refer to evaluation for details): Social Participation;IADL's;Rest and Sleep;Work;Leisure    Cognitive Skills Attention    Psychosocial Skills Coping Strategies;Habits;Interpersonal Interaction;Routines and Behaviors    Rehab Potential Good    Clinical Decision Making Limited treatment options, no task modification necessary    Comorbidities Affecting Occupational Performance: None    Modification or Assistance to Complete Evaluation  No modification of tasks or assist necessary to complete eval    OT Frequency 5x / week    OT Duration 4 weeks    OT Treatment/Interventions Psychosocial skills training;Coping strategies training    Consulted and Agree with Plan of Care Patient          Patient will benefit from skilled  therapeutic intervention in order to improve the following deficits and impairments:     Cognitive Skills: Attention Psychosocial Skills: Coping Strategies, Habits, Interpersonal Interaction, Routines and Behaviors   Visit Diagnosis: Difficulty coping  Bipolar I disorder, most recent episode depressed (HCC)    Problem List Patient Active Problem List   Diagnosis Date Noted   Migraine 01/06/2024   Suicide attempt (HCC) 11/17/2022   OSA (obstructive sleep apnea) 11/07/2022   Hyperlipidemia 06/03/2020   Bipolar I disorder, most recent episode depressed (HCC) 08/07/2017    Class: Chronic   Hoarseness of voice 10/04/2015   Tachycardia 08/30/2015   Leukocytosis 02/08/2015   Other specified hypothyroidism 02/08/2015   MDD (major depressive disorder), recurrent episode, severe (HCC) 12/01/2013   Substance induced mood disorder (HCC) 04/11/2012    Class: Acute   Severe episode of recurrent major depressive disorder (HCC) 04/09/2012    Class: Acute   Keratosis pilaris 02/21/2011    [place] KANDICE Purpura, OT 04/20/2024, 9:55 AM  [name], OT   [facility] [address] [place], [place], [address] Phone: [phone]   Fax:  [phone]  Name: Orlondo Holycross MRN: [id number] Date of Birth: [birth year]

## 2024-04-20 NOTE — Therapy (Signed)
  [facility] [address] [place], [place], [address] Phone: [phone]   Fax:  [phone]  Occupational Therapy Treatment  Patient Details  Name: Alexander Harrington MRN: [id number] Date of Birth: 03-28-1991 No data recorded  Encounter Date: 03/25/2024   OT End of Session - 04/20/24 2050     Visit Number 2    Number of Visits 20    Date for Recertification  04/20/24    OT Start Time 1200    OT Stop Time 1300    OT Time Calculation (min) 60 min    Activity Tolerance Patient tolerated treatment well          Past Medical History:  Diagnosis Date   ADHD    Anxiety    Dr. [name]   Bipolar 2 disorder (HCC)    Depression    HLD (hyperlipidemia)    Insomnia    Sleep apnea    do not use CPAP    Past Surgical History:  Procedure Laterality Date   DRUG INDUCED ENDOSCOPY N/A 08/28/2022   Procedure: DRUG INDUCED ENDOSCOPY;  Surgeon: Carlie Clark, MD;  Location: [facility];  Service: ENT;  Laterality: N/A;   OTHER SURGICAL HISTORY     sedated ect treatment   TONSILLECTOMY Bilateral 11/07/2022   Procedure: TONSILLECTOMY;  Surgeon: Carlie Clark, MD;  Location: [facility] OR;  Service: ENT;  Laterality: Bilateral;    There were no vitals filed for this visit.   Subjective Assessment - 04/20/24 2050     Currently in Pain? No/denies    Pain Score 0-No pain             Group Session:  S: Still have a lot of nervous energy today. Hard time concentrating.  O: The primary objective of this topic is to explore and understand the concept of occupational balance in the context of daily living. The term occupational balance is defined broadly, encompassing all activities that occupy an individual's time and energy, including self-care, leisure, and work-related tasks. The goal is to guide participants towards achieving a harmonious blend of these activities, tailored to their personal values and life circumstances. This balance is aimed at  enhancing overall well-being, not by equally distributing time across activities, but by ensuring that daily engagements are fulfilling and not draining. The content delves into identifying various barriers that individuals face in achieving occupational balance, such as overcommitment, misaligned priorities, external pressures, and lack of effective time management. The impact of these barriers on occupational performance, roles, and lifestyles is examined, highlighting issues like reduced efficiency, strained relationships, and potential health problems. Strategies for cultivating occupational balance are a key focus. These strategies include practical methods like time blocking, prioritizing tasks, establishing self-care rituals, decluttering, connecting with nature, and engaging in reflective practices. These approaches are designed to be adaptable and applicable to a wide range of life scenarios, promoting a proactive and mindful approach to daily living. The overall aim is to equip participants with the knowledge and tools to create a balanced lifestyle that supports their mental, emotional, and physical health, thereby improving their functional performance in daily life.   A:  The patient demonstrated a high level of engagement and active participation throughout the session on occupational balance. The patient frequently contributed to discussions, offering insightful reflections on personal experiences related to the barriers and strategies for achieving occupational balance. There was a clear understanding of the concept and an ability to relate it to their own life. The patient  showed enthusiasm in learning and applying the strategies discussed, such as time blocking and self-care rituals, indicating a strong motivation to improve their occupational balance. The patient's proactive approach and responsiveness to the topic suggest a high potential for implementing these strategies effectively in  their daily routine.   P: Continue to attend PHP OT group sessions 5x week for 4 weeks to promote daily structure, social engagement, and opportunities to develop and utilize adaptive strategies to maximize functional performance in preparation for safe transition and integration back into school, work, and the community. Plan to address topic of pt 3 in next OT group session.                   OT Education - 04/20/24 2050     Education Details Tx Occupational Balance    Person(s) Educated Patient           OT Short Term Goals - 04/20/24 [phone]       OT SHORT TERM GOAL #1   Title Patient will be educated on strategies to improve psychosocial skills needed to participate fully in all daily, work, and leisure activities.    Time 4    Period Weeks    Status On-going    Target Date 08/15/17      OT SHORT TERM GOAL #2   Title Patient will be educated on a HEP and independent with implementation of HEP.    Status On-going      OT SHORT TERM GOAL #3   Title Patient will independently apply psychosocial skills and coping mechanisms to her daily activities in order to function independently.    Status On-going                   Plan - 04/20/24 2051     Occupational performance deficits (Please refer to evaluation for details): Social Participation;IADL's;Rest and Sleep;Work;Leisure    Psychosocial Skills Coping Strategies;Habits;Interpersonal Interaction;Routines and Behaviors          Patient will benefit from skilled therapeutic intervention in order to improve the following deficits and impairments:       Psychosocial Skills: Coping Strategies, Habits, Interpersonal Interaction, Routines and Behaviors   Visit Diagnosis: Difficulty coping    Problem List Patient Active Problem List   Diagnosis Date Noted   Migraine 01/06/2024   Suicide attempt (HCC) 11/17/2022   OSA (obstructive sleep apnea) 11/07/2022   Hyperlipidemia 06/03/2020   Bipolar  I disorder, most recent episode depressed (HCC) 08/07/2017    Class: Chronic   Hoarseness of voice 10/04/2015   Tachycardia 08/30/2015   Leukocytosis 02/08/2015   Other specified hypothyroidism 02/08/2015   MDD (major depressive disorder), recurrent episode, severe (HCC) 12/01/2013   Substance induced mood disorder (HCC) 04/11/2012    Class: Acute   Severe episode of recurrent major depressive disorder (HCC) 04/09/2012    Class: Acute   Keratosis pilaris 02/21/2011    D[place] KANDICE Purpura, OT 04/20/2024, 8:52 PM [name], OT   [facility] [address] [place], [place], [address] Phone: [phone]   Fax:  [phone]  Name: Alexander Harrington MRN: [id number] Date of Birth: June 25, 1991

## 2024-04-21 ENCOUNTER — Ambulatory Visit (HOSPITAL_COMMUNITY)

## 2024-04-21 ENCOUNTER — Encounter (HOSPITAL_COMMUNITY): Payer: Self-pay

## 2024-04-21 NOTE — Psych (Signed)
 [facility] PHP THERAPIST PROGRESS NOTE  Alexander Harrington [id number]   Session Time: 9:00 am - 10:00 am  Participation Level: Active  Behavioral Response: CasualAlertAnxious and Euthymic  Type of Therapy: Group Therapy  Treatment Goals addressed: Coping  Progress Towards Goals: Progressing  Interventions: CBT, DBT, Solution Focused, Strength-based, Supportive, and Reframing  Therapist Response: Clinician led check-in regarding current stressors and situation, and review of patient completed daily inventory. Clinician utilized active listening and empathetic response and validated patient emotions. Clinician facilitated processing group on pertinent issues.?   Summary: Patient arrived within time allowed. Patient rates their depression at a 6 and anxiety at a 6 on a scale of 1-10 with 10 being best. Pt reports he is nervous about discharging from [facility], as he has benefited from the structure and support. Pt identifies ways in which he can maintain structure and progress outside of group. When asked about sleep and appetite, pt reports he slept 8 hours last night and ate 3 meals yesterday. Pt denied experiencing SI/SH thoughts and feelings of hopelessness since last session. Pt able to process.?Pt engaged in discussion.?      Session Time: 10:00 am - 11:00 am  Participation Level: Active  Behavioral Response: CasualAlertAnxious and Euthymic  Type of Therapy: Group Therapy  Treatment Goals addressed: Coping  Progress Towards Goals: Progressing  Interventions: CBT, DBT, Solution Focused, Strength-based, Supportive, and Reframing  Therapist Response: Clinician led processing group for pt's current struggles. Group members shared stressors and provided support and feedback. Clinician brought in topics of shame and acceptance to inform discussion.  Summary: Pt able to process and provide support to group. He shared his struggles with being triggered at the movie theater over the weekend and  discussed the negative self-talk that resulted and described how he coped with it.    Session Time: 11:00 am - 12:00 pm  Participation Level: Active  Behavioral Response: CasualAlertAnxious and Euthymic  Type of Therapy: Group Therapy  Treatment Goals addressed: Coping  Progress Towards Goals: Progressing  Interventions: CBT, DBT, Solution Focused, Strength-based, Supportive, and Reframing  Therapist Response: Clinician led group on Cognitive Distortions. Clinician listed different types of cognitive distortions and provided examples. Pts were asked to provide personal examples of cognitive distortions they currently experience or have previously experienced. Patients were also asked to complete a worksheet that utilizes socratic questioning to challenge cognitive distortions.  Summary: Pt engaged in discussion.   Session Time: 12:00 pm - 12:30 pm  Participation Level: Active  Behavioral Response: CasualAlertAnxious and Euthymic  Type of Therapy: Group Therapy  Treatment Goals addressed: Coping  Progress Towards Goals: Progressing  Interventions: Psychologist, occupational, Supportive  Therapist Response: Reflection Group: Patients encouraged to practice skills and interpersonal techniques or work on mindfulness and relaxation techniques. The importance of self-care and making skills part of a routine to increase usage were stressed.  Summary: Patient engaged and participated appropriately.   Session Time: 12:30 pm - 1:00 pm  Participation Level: Active  Behavioral Response: CasualAlertAnxious and Euthymic  Type of Therapy: Group Therapy  Treatment Goals addressed: Coping  Progress Towards Goals: Progressing  Interventions: CBT, DBT, Solution Focused, Strength-based, Supportive, and Reframing  Therapist Response: Clinician continued group on cognitive distortions. Pts were asked to identify a specific cognitive distortion they experience and clinician asked  questions to identify facts, beliefs, and feelings contributing to the distortions and assisted with composing reframing statements. Clinician utilized CBT principles to inform discussion.  Summary: Pt engaged and participated in discussion. He was  receptive to feedback and demonstrated good insight into the subject matter.   Session Time: 1:00 pm - 1:30 pm  Participation Level: Active  Behavioral Response: CasualAlertAnxious and Euthymic  Type of Therapy: Group Therapy  Treatment Goals addressed: Coping  Progress Towards Goals: Progressing  Interventions: CBT, DBT, Solution Focused, Strength-based, Supportive, and Reframing  Therapist Response:  Clinician utilized a guided meditation video led by yoga instructor Adriene of [employer] that focuses on controlled breathing. Patients were invited to share their responses upon completion.   Summary: Patient participated in activity. Patients were dismissed at 1:30 pm due to OT being absent. At check-out, patient contracts for safety.?Patient demonstrates progress as evidenced by her continued engagement and by being receptive to treatment. Patient denies SI/HI/self-harm thoughts at the end of group and agrees to seek help should those thoughts/feelings occur.?   Suicidal/Homicidal: Nowithout intent/plan  Plan: ?Pt will discharge from PHP due to meeting treatment goals of decreased depression symptoms, increased emotion regulation, and increased ability to manage symptoms in a healthy manner. Pt will follow up with his established therapist for therapy as well with other therapists recommended per his request, and his established provider for medication management on. Pt and provider are aligned with discharge plan. Pt denies SI/HI at time of discharge.    Collaboration of Care: Medication Management AEB Staci Kerns, NP  Patient/Guardian was advised Release of Information must be obtained prior to any record release in order to  collaborate their care with an outside provider. Patient/Guardian was advised if they have not already done so to contact the registration department to sign all necessary forms in order for us  to release information regarding their care.   Consent: Patient/Guardian gives verbal consent for treatment and assignment of benefits for services provided during this visit. Patient/Guardian expressed understanding and agreed to proceed.   Diagnosis: Bipolar I disorder, most recent episode depressed (HCC) [F31.30]    1. Bipolar I disorder, most recent episode depressed (HCC)   2. Difficulty coping       Will LILLETTE Pollack, LCSW 04/21/2024

## 2024-04-21 NOTE — Therapy (Signed)
 [facility] [address] [place], [place], [address] Phone: [phone]   Fax:  [phone]  Occupational Therapy Treatment  Patient Details  Name: Alexander Harrington MRN: [id number] Date of Birth: 1991/07/02 No data recorded  Encounter Date: 03/27/2024   OT End of Session - 04/21/24 1528     Visit Number 4    Number of Visits 20    Date for Recertification  04/20/24    OT Start Time 1200    OT Stop Time 1250    OT Time Calculation (min) 50 min          Past Medical History:  Diagnosis Date   ADHD    Anxiety    Dr. [name]   Bipolar 2 disorder (HCC)    Depression    HLD (hyperlipidemia)    Insomnia    Sleep apnea    do not use CPAP    Past Surgical History:  Procedure Laterality Date   DRUG INDUCED ENDOSCOPY N/A 08/28/2022   Procedure: DRUG INDUCED ENDOSCOPY;  Surgeon: Carlie Clark, MD;  Location: [facility];  Service: ENT;  Laterality: N/A;   OTHER SURGICAL HISTORY     sedated ect treatment   TONSILLECTOMY Bilateral 11/07/2022   Procedure: TONSILLECTOMY;  Surgeon: Carlie Clark, MD;  Location: [facility] OR;  Service: ENT;  Laterality: Bilateral;    There were no vitals filed for this visit.   Subjective Assessment - 04/21/24 1528     Currently in Pain? No/denies    Pain Score 0-No pain            Group Session:  S: Feeling a little better today I think. Slept better.  O: The primary objective of this topic is to explore and understand the concept of occupational balance in the context of daily living. The term occupational balance is defined broadly, encompassing all activities that occupy an individual's time and energy, including self-care, leisure, and work-related tasks. The goal is to guide participants towards achieving a harmonious blend of these activities, tailored to their personal values and life circumstances. This balance is aimed at enhancing overall well-being, not by equally distributing time across  activities, but by ensuring that daily engagements are fulfilling and not draining. The content delves into identifying various barriers that individuals face in achieving occupational balance, such as overcommitment, misaligned priorities, external pressures, and lack of effective time management. The impact of these barriers on occupational performance, roles, and lifestyles is examined, highlighting issues like reduced efficiency, strained relationships, and potential health problems. Strategies for cultivating occupational balance are a key focus. These strategies include practical methods like time blocking, prioritizing tasks, establishing self-care rituals, decluttering, connecting with nature, and engaging in reflective practices. These approaches are designed to be adaptable and applicable to a wide range of life scenarios, promoting a proactive and mindful approach to daily living. The overall aim is to equip participants with the knowledge and tools to create a balanced lifestyle that supports their mental, emotional, and physical health, thereby improving their functional performance in daily life.   A:  The patient demonstrated a high level of engagement and active participation throughout the session on occupational balance. The patient frequently contributed to discussions, offering insightful reflections on personal experiences related to the barriers and strategies for achieving occupational balance. There was a clear understanding of the concept and an ability to relate it to their own life. The patient showed enthusiasm in learning and applying the strategies discussed, such as time  blocking and self-care rituals, indicating a strong motivation to improve their occupational balance. The patient's proactive approach and responsiveness to the topic suggest a high potential for implementing these strategies effectively in their daily routine.    P: Continue to attend PHP OT group sessions  5x week for 4 weeks to promote daily structure, social engagement, and opportunities to develop and utilize adaptive strategies to maximize functional performance in preparation for safe transition and integration back into school, work, and the community. Plan to address topic of SMART Goals in next OT group session.                    OT Education - 04/21/24 1528     Education Details Occupational Balance           OT Short Term Goals - 04/20/24 0952       OT SHORT TERM GOAL #1   Title Patient will be educated on strategies to improve psychosocial skills needed to participate fully in all daily, work, and leisure activities.    Time 4    Period Weeks    Status On-going    Target Date 08/15/17      OT SHORT TERM GOAL #2   Title Patient will be educated on a HEP and independent with implementation of HEP.    Status On-going      OT SHORT TERM GOAL #3   Title Patient will independently apply psychosocial skills and coping mechanisms to her daily activities in order to function independently.    Status On-going                   Plan - 04/21/24 1528     Cognitive Skills Attention    Psychosocial Skills Coping Strategies;Habits;Interpersonal Interaction;Routines and Behaviors          Patient will benefit from skilled therapeutic intervention in order to improve the following deficits and impairments:     Cognitive Skills: Attention Psychosocial Skills: Coping Strategies, Habits, Interpersonal Interaction, Routines and Behaviors   Visit Diagnosis: Difficulty coping    Problem List Patient Active Problem List   Diagnosis Date Noted   Migraine 01/06/2024   Suicide attempt (HCC) 11/17/2022   OSA (obstructive sleep apnea) 11/07/2022   Hyperlipidemia 06/03/2020   Bipolar I disorder, most recent episode depressed (HCC) 08/07/2017    Class: Chronic   Hoarseness of voice 10/04/2015   Tachycardia 08/30/2015   Leukocytosis 02/08/2015   Other  specified hypothyroidism 02/08/2015   MDD (major depressive disorder), recurrent episode, severe (HCC) 12/01/2013   Substance induced mood disorder (HCC) 04/11/2012    Class: Acute   Severe episode of recurrent major depressive disorder (HCC) 04/09/2012    Class: Acute   Keratosis pilaris 02/21/2011    [place] KANDICE Purpura, OT 04/21/2024, 3:29 PM  [name], OT   [address] [facility] [address] [place], [place], [address] Phone: [phone]   Fax:  [phone]  Name: Nabor Thomann MRN: [id number] Date of Birth: June 03, 1991
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
## 2024-04-21 NOTE — Progress Notes (Deleted)
  Healthcare at [facility] [address] [place], [place] [address] [phone]  Date:  04/23/2024   Name:  Alexander Harrington   DOB:  1991/02/15   MRN:  [id number]  PCP:  Watt Harlene BROCKS, MD    Chief Complaint: No chief complaint on file.   History of Present Illness:  Alexander Harrington is a 33 y.o. very pleasant male patient who presents with the following:  Patient seen today to discuss headaches and a concern about his bladder I saw him most recently about 2 months ago regarding a recent mental health crisis, please see note from that day for more details  -Flu shot -Recommend COVID booster this fall Discussed the use of AI scribe software for clinical note transcription with the patient, who gave verbal consent to proceed.  History of Present Illness     Patient Active Problem List   Diagnosis Date Noted   Migraine 01/06/2024   Suicide attempt (HCC) 11/17/2022   OSA (obstructive sleep apnea) 11/07/2022   Hyperlipidemia 06/03/2020   Bipolar I disorder, most recent episode depressed (HCC) 08/07/2017    Class: Chronic   Hoarseness of voice 10/04/2015   Tachycardia 08/30/2015   Leukocytosis 02/08/2015   Other specified hypothyroidism 02/08/2015   MDD (major depressive disorder), recurrent episode, severe (HCC) 12/01/2013   Substance induced mood disorder (HCC) 04/11/2012    Class: Acute   Severe episode of recurrent major depressive disorder (HCC) 04/09/2012    Class: Acute   Keratosis pilaris 02/21/2011    Past Medical History:  Diagnosis Date   ADHD    Anxiety    Dr. [name]   Bipolar 2 disorder (HCC)    Depression    HLD (hyperlipidemia)    Insomnia    Sleep apnea    do not use CPAP    Past Surgical History:  Procedure Laterality Date   DRUG INDUCED ENDOSCOPY N/A 08/28/2022   Procedure: DRUG INDUCED ENDOSCOPY;  Surgeon: Carlie Clark, MD;  Location: [facility];  Service: ENT;  Laterality: N/A;    OTHER SURGICAL HISTORY     sedated ect treatment   TONSILLECTOMY Bilateral 11/07/2022   Procedure: TONSILLECTOMY;  Surgeon: Carlie Clark, MD;  Location: [facility] OR;  Service: ENT;  Laterality: Bilateral;    Social History   Tobacco Use   Smoking status: Never   Smokeless tobacco: Never   Tobacco comments:    Patient does vape occasionally.  No nicotine   Vaping Use   Vaping status: Never Used  Substance Use Topics   Alcohol  use: Yes    Alcohol /week: 2.0 - 4.0 standard drinks of alcohol     Types: 2 - 4 Cans of beer per week    Comment: weekly   Drug use: Yes    Frequency: 1.0 times per week    Types: Marijuana    Comment: Last use 11/05/22    Family History  Problem Relation Age of Onset   Arthritis Mother    Mental illness Mother    Depression Mother    Anxiety disorder Mother    Drug abuse Maternal Grandfather    Alcohol  abuse Maternal Grandfather    Drug abuse Maternal Grandmother    Alcohol  abuse Maternal Grandmother     Allergies  Allergen Reactions   Codeine Rash    Medication list has been reviewed and updated.  Current Outpatient Medications on File Prior to Visit  Medication Sig Dispense Refill   baclofen  (LIORESAL ) 10  MG tablet Take 1 tablet (10 mg total) by mouth 3 (three) times daily. (Patient not taking: Reported on 04/15/2024) 30 each 0   clonazePAM  (KLONOPIN ) 1 MG tablet Take 1 mg by mouth 3 (three) times daily as needed for anxiety.     eszopiclone (LUNESTA) 2 MG TABS tablet Take 2 mg by mouth at bedtime as needed for sleep. Take immediately before bedtime (Patient not taking: Reported on 04/15/2024)     ibuprofen  (ADVIL ) 600 MG tablet Take 1 tablet (600 mg total) by mouth every 6 (six) hours as needed. 30 tablet 0   lithium  600 MG capsule Take 600 mg by mouth at bedtime. Taking 1200mg      LYBALVI 20-10 MG TABS Take 1 tablet by mouth at bedtime.     mirtazapine (REMERON) 15 MG tablet Take 15 mg by mouth at bedtime.     OLANZapine (ZYPREXA) 5 MG tablet  Take 5 mg by mouth every morning.     prazosin (MINIPRESS) 1 MG capsule Take 1 mg by mouth at bedtime.     rizatriptan  (MAXALT ) 10 MG tablet Take 1 tablet (10 mg total) by mouth as needed for migraine. May repeat in 2 hours if needed 10 tablet 0   rosuvastatin  (CRESTOR ) 10 MG tablet Take 1 tablet (10 mg total) by mouth daily. 90 tablet 3   Suvorexant (BELSOMRA PO) Take by mouth. (Patient not taking: Reported on 04/15/2024)     VYVANSE 30 MG capsule Take 30 mg by mouth daily. (Patient not taking: Reported on 04/15/2024)     No current facility-administered medications on file prior to visit.    Review of Systems:  As per HPI- otherwise negative.   Physical Examination: There were no vitals filed for this visit. There were no vitals filed for this visit. There is no height or weight on file to calculate BMI. Ideal Body Weight:    GEN: no acute distress. HEENT: Atraumatic, Normocephalic.  Ears and Nose: No external deformity. CV: RRR, No M/G/R. No JVD. No thrill. No extra heart sounds. PULM: CTA B, no wheezes, crackles, rhonchi. No retractions. No resp. distress. No accessory muscle use. ABD: S, NT, ND, +BS. No rebound. No HSM. EXTR: No c/c/e PSYCH: Normally interactive. Conversant.    Assessment and Plan: No diagnosis found.  Assessment & Plan   Signed Harlene Schroeder, MD

## 2024-04-21 NOTE — Psych (Signed)
 [facility] PHP THERAPIST PROGRESS NOTE  Alexander Harrington [id number]  Session Time: 9:00 - 10:00  Participation Level: Active  Behavioral Response: CasualAlertDepressed  Type of Therapy: Group Therapy  Treatment Goals addressed: Coping  Progress Towards Goals: Progressing  Interventions: CBT, DBT, Supportive, and Reframing  Summary: Alexander Harrington is a 33 y.o. male who presents with depression symptoms.  Clinician led check-in regarding current stressors and situation, and review of patient completed daily inventory. Clinician utilized active listening and empathetic response and validated patient emotions. Clinician facilitated processing group on pertinent issues.?   Summary: Patient arrived within time allowed. Patient rates his depression at a 6 and anxiety at a 6 on a scale of 1-10 with 10 being high. Pt reports sleeping 9 hours and eating 3 times yesterday. Pt reports yesterday was difficult for him and he struggled with ruminating and fixating throughout the day. Pt states he had intense trauma reactions and self-hatred and self-judgment. Pt struggles to provide elaboration on these experiences.Pt states struggle with taking baby steps and allowing himself progress. Pt reports having his girlfriend staying with him currently is helpful and he has someone to support him and physically be present with him. Pt denies current SI/HI. Pt able to process.?Pt engaged in discussion.?       Session Time: 10:00 am - 11:00 am  Participation Level: Active  Behavioral Response: CasualAlertAnxious and Depressed  Type of Therapy: Group Therapy  Treatment Goals addressed: Coping  Progress Towards Goals: Progressing  Interventions: CBT, DBT,  Supportive, and Reframing  Therapist Response: Cln led discussion on support groups and the role they can provide in recovery and ongoing treatment. Group discussed success and barriers they have had with past support groups or they have with considering  support groups. Cln provided resources for local support groups.    Summary: Pt able to process and engage in discussion.            Session Time: 11:00 am - 12:00 pm   Participation Level: Active   Behavioral Response: CasualAlertAnxious   Type of Therapy: Group Therapy   Treatment Goals addressed: Coping   Progress Towards Goals: Initial   Interventions: CBT, DBT, Solution Focused, Strength-based, Supportive, and Reframing   Therapist Response: Cln continued topic of DBT distress tolerance skills and the ACCEPTS distraction skill. Group reviewed E-T-S skills and discussed how they can practice them in their every day life.    Summary: Pt engaged in discussion and is able to state ways to apply these skills.              Session Time: 12:00 pm - 1:00 pm   Participation Level: Active   Behavioral Response: CasualAlertAnxious   Type of Therapy: Group Therapy   Treatment Goals addressed: Coping   Progress Towards Goals: Initial   Interventions: CBT, DBT, Solution Focused, Strength-based, Supportive, and Reframing   Therapist Response: 12:00 - 12:50 Group was led by occupational therapist, [name].  12:50 - 1:00 pm: Clinician led check-out. Clinician assessed for immediate needs, medication compliance and efficacy, and safety concerns?   Summary: 12:00 pm - 12:50 pm: Pt  engaged in discussion.  12:50 - 1:00 pm: At check-out, patient reports no immediate concerns. Patient demonstrates progress as evidenced by continued engagement and responsiveness to treatment. Patient denies SI/HI/self-harm thoughts at the end of group.     Suicidal/Homicidal: Nowithout intent/plan  Plan: Pt will continue in PHP while working to decrease depression symptoms, increase daily functioning, and increase ability to manage symptoms  in a healthy manner.   Collaboration of Care: Medication Management AEB T Lewis, NP  Patient/Guardian was advised Release of Information must be  obtained prior to any record release in order to collaborate their care with an outside provider. Patient/Guardian was advised if they have not already done so to contact the registration department to sign all necessary forms in order for us  to release information regarding their care.   Consent: Patient/Guardian gives verbal consent for treatment and assignment of benefits for services provided during this visit. Patient/Guardian expressed understanding and agreed to proceed.   Diagnosis: Bipolar I disorder, most recent episode depressed (HCC) [F31.30]    1. Bipolar I disorder, most recent episode depressed (HCC)       Randall Bastos, LCSW 04/21/2024

## 2024-04-21 NOTE — Therapy (Signed)
 [facility] [address] [place], [place], [address] Phone: [phone]   Fax:  [phone]  Occupational Therapy Treatment  Patient Details  Name: Alexander Harrington MRN: [id number] Date of Birth: September 18, 1990 No data recorded  Encounter Date: 03/31/2024   OT End of Session - 04/21/24 1725     Visit Number 6    Number of Visits 20    Date for Recertification  04/20/24    OT Start Time 1200    OT Stop Time 1300    OT Time Calculation (min) 60 min          Past Medical History:  Diagnosis Date   ADHD    Anxiety    Dr. [name]   Bipolar 2 disorder (HCC)    Depression    HLD (hyperlipidemia)    Insomnia    Sleep apnea    do not use CPAP    Past Surgical History:  Procedure Laterality Date   DRUG INDUCED ENDOSCOPY N/A 08/28/2022   Procedure: DRUG INDUCED ENDOSCOPY;  Surgeon: Carlie Clark, MD;  Location: [facility];  Service: ENT;  Laterality: N/A;   OTHER SURGICAL HISTORY     sedated ect treatment   TONSILLECTOMY Bilateral 11/07/2022   Procedure: TONSILLECTOMY;  Surgeon: Carlie Clark, MD;  Location: [facility] OR;  Service: ENT;  Laterality: Bilateral;    There were no vitals filed for this visit.   Subjective Assessment - 04/21/24 1724     Currently in Pain? No/denies    Pain Score 0-No pain               Group Session:  S: Still dealing with anxiety and nervousness but I think it's a bit better.   O: During today's OT group session, the patient participated in an educational segment about the importance of goal-setting and the application of the SMART framework to enhance daily life, particularly focusing on ADLs and iADLs. The session began with five open-ended pre-session questions that facilitated group discussion and introspection about their current relationship with goals. Following the introduction and educational segment, participants engaged in brainstorming and group discussions to devise hypothetical SMART  goals. The session concluded with five post-session questions to reinforce understanding and facilitate reflection. Throughout the session, there was a range of engagement levels noted among the participants.   A:  Patient demonstrated a high level of engagement throughout the session. They actively participated in discussions, sharing personal experiences related to goal setting and challenges faced. Patient was able to clearly articulate an understanding of the SMART framework and proposed personal SMART goals related to their own ADLs with minimal assistance. They expressed enthusiasm about applying what they learned to their daily routine and appeared motivated to make changes.   P: Continue to attend PHP OT group sessions 5x week for 4 weeks to promote daily structure, social engagement, and opportunities to develop and utilize adaptive strategies to maximize functional performance in preparation for safe transition and integration back into school, work, and the community. Plan to address topic of SMART Goals cont'd in next OT group session.                  OT Education - 04/21/24 1724     Education Details SMART Goals    Person(s) Educated Patient    Methods Handout;Explanation    Comprehension Verbalized understanding           OT Short Term Goals - 04/20/24 9047  OT SHORT TERM GOAL #1   Title Patient will be educated on strategies to improve psychosocial skills needed to participate fully in all daily, work, and leisure activities.    Time 4    Period Weeks    Status On-going    Target Date 08/15/17      OT SHORT TERM GOAL #2   Title Patient will be educated on a HEP and independent with implementation of HEP.    Status On-going      OT SHORT TERM GOAL #3   Title Patient will independently apply psychosocial skills and coping mechanisms to her daily activities in order to function independently.    Status On-going                   Plan  - 04/21/24 1725     Cognitive Skills Attention    Psychosocial Skills Coping Strategies;Habits;Interpersonal Interaction;Routines and Behaviors          Patient will benefit from skilled therapeutic intervention in order to improve the following deficits and impairments:     Cognitive Skills: Attention Psychosocial Skills: Coping Strategies, Habits, Interpersonal Interaction, Routines and Behaviors   Visit Diagnosis: Difficulty coping    Problem List Patient Active Problem List   Diagnosis Date Noted   Migraine 01/06/2024   Suicide attempt (HCC) 11/17/2022   OSA (obstructive sleep apnea) 11/07/2022   Hyperlipidemia 06/03/2020   Bipolar I disorder, most recent episode depressed (HCC) 08/07/2017    Class: Chronic   Hoarseness of voice 10/04/2015   Tachycardia 08/30/2015   Leukocytosis 02/08/2015   Other specified hypothyroidism 02/08/2015   MDD (major depressive disorder), recurrent episode, severe (HCC) 12/01/2013   Substance induced mood disorder (HCC) 04/11/2012    Class: Acute   Severe episode of recurrent major depressive disorder (HCC) 04/09/2012    Class: Acute   Keratosis pilaris 02/21/2011    [place] KANDICE Purpura, OT 04/21/2024, 5:26 PM  [name], OT   [facility] [address] [place], [place], [address] Phone: [phone]   Fax:  [phone]  Name: Alexander Harrington MRN: [id number] Date of Birth: Mar 11, 1991

## 2024-04-21 NOTE — Therapy (Signed)
 [facility] [address] [place], [place], [address] Phone: [phone]   Fax:  [phone]  Occupational Therapy Treatment  Patient Details  Name: Alexander Harrington MRN: [id number] Date of Birth: 12-14-1990 No data recorded  Encounter Date: 03/30/2024   OT End of Session - 04/21/24 1635     Visit Number 5    Number of Visits 20    Date for Recertification  04/20/24    OT Start Time 1200    OT Stop Time 1300    OT Time Calculation (min) 60 min          Past Medical History:  Diagnosis Date   ADHD    Anxiety    Dr. [name]   Bipolar 2 disorder (HCC)    Depression    HLD (hyperlipidemia)    Insomnia    Sleep apnea    do not use CPAP    Past Surgical History:  Procedure Laterality Date   DRUG INDUCED ENDOSCOPY N/A 08/28/2022   Procedure: DRUG INDUCED ENDOSCOPY;  Surgeon: Carlie Clark, MD;  Location: [facility];  Service: ENT;  Laterality: N/A;   OTHER SURGICAL HISTORY     sedated ect treatment   TONSILLECTOMY Bilateral 11/07/2022   Procedure: TONSILLECTOMY;  Surgeon: Carlie Clark, MD;  Location: [facility] OR;  Service: ENT;  Laterality: Bilateral;    There were no vitals filed for this visit.   Subjective Assessment - 04/21/24 1634     Currently in Pain? No/denies    Pain Score 0-No pain              Group Session:  S: Doing a little bit better today I think.   O: During today's OT group session, the patient participated in an educational segment about the importance of goal-setting and the application of the SMART framework to enhance daily life, particularly focusing on ADLs and iADLs. The session began with five open-ended pre-session questions that facilitated group discussion and introspection about their current relationship with goals. Following the introduction and educational segment, participants engaged in brainstorming and group discussions to devise hypothetical SMART goals. The session concluded with five  post-session questions to reinforce understanding and facilitate reflection. Throughout the session, there was a range of engagement levels noted among the participants.   A:  Patient demonstrated a high level of engagement throughout the session. They actively participated in discussions, sharing personal experiences related to goal setting and challenges faced. Patient was able to clearly articulate an understanding of the SMART framework and proposed personal SMART goals related to their own ADLs with minimal assistance. They expressed enthusiasm about applying what they learned to their daily routine and appeared motivated to make changes.    P: Continue to attend PHP OT group sessions 5x week for 4 weeks to promote daily structure, social engagement, and opportunities to develop and utilize adaptive strategies to maximize functional performance in preparation for safe transition and integration back into school, work, and the community. Plan to address topic of pt 2 in next OT group session.                  OT Education - 04/21/24 1634     Education Details SMART Goals           OT Short Term Goals - 04/20/24 0952       OT SHORT TERM GOAL #1   Title Patient will be educated on strategies to improve psychosocial skills needed to participate fully  in all daily, work, and leisure activities.    Time 4    Period Weeks    Status On-going    Target Date 08/15/17      OT SHORT TERM GOAL #2   Title Patient will be educated on a HEP and independent with implementation of HEP.    Status On-going      OT SHORT TERM GOAL #3   Title Patient will independently apply psychosocial skills and coping mechanisms to her daily activities in order to function independently.    Status On-going                   Plan - 04/21/24 1635     Cognitive Skills Attention    Psychosocial Skills Coping Strategies;Habits;Interpersonal Interaction;Routines and Behaviors           Patient will benefit from skilled therapeutic intervention in order to improve the following deficits and impairments:     Cognitive Skills: Attention Psychosocial Skills: Coping Strategies, Habits, Interpersonal Interaction, Routines and Behaviors   Visit Diagnosis: Difficulty coping    Problem List Patient Active Problem List   Diagnosis Date Noted   Migraine 01/06/2024   Suicide attempt (HCC) 11/17/2022   OSA (obstructive sleep apnea) 11/07/2022   Hyperlipidemia 06/03/2020   Bipolar I disorder, most recent episode depressed (HCC) 08/07/2017    Class: Chronic   Hoarseness of voice 10/04/2015   Tachycardia 08/30/2015   Leukocytosis 02/08/2015   Other specified hypothyroidism 02/08/2015   MDD (major depressive disorder), recurrent episode, severe (HCC) 12/01/2013   Substance induced mood disorder (HCC) 04/11/2012    Class: Acute   Severe episode of recurrent major depressive disorder (HCC) 04/09/2012    Class: Acute   Keratosis pilaris 02/21/2011    D[place] KANDICE Purpura, OT 04/21/2024, 4:35 PM  [name], OT    [facility] [address] [place], [place], [address] Phone: [phone]   Fax:  [phone]  Name: Alexander Harrington MRN: [id number] Date of Birth: Oct 10, 1990

## 2024-04-21 NOTE — Psych (Signed)
 [facility] PHP THERAPIST PROGRESS NOTE  Kyreese Chio [id number]  Session Time: 9:00 - 10:00  Participation Level: Active  Behavioral Response: CasualAlertDepressed  Type of Therapy: Group Therapy  Treatment Goals addressed: Coping  Progress Towards Goals: Progressing  Interventions: CBT, DBT, Supportive, and Reframing  Summary: Johnny Gorter is a 33 y.o. male who presents with depression symptoms.  Clinician led check-in regarding current stressors and situation, and review of patient completed daily inventory. Clinician utilized active listening and empathetic response and validated patient emotions. Clinician facilitated processing group on pertinent issues.?   Summary: Patient arrived within time allowed. Patient rates his depression at a 6 and anxiety at a 7 on a scale of 1-10 with 10 being high. Pt reports sleeping 7 hours and eating 2 times yesterday. Pt reports feeling an intense sense of doom and is unable to articulate more on that. Pt states he thinks his immune system is currently weakened from stress and that he has continued to feel physically off for the past few days. Pt reports an increase in taking his PRN anxiety medicine over the last week, and states he is still taking within what is prescribed. Pt states he feels his anxiety and depression have switched in intensity and he is more depressed now.  Pt denies current SI/HI. Pt able to process.?Pt engaged in discussion.?      Session Time: 10:00 am - 11:00 am  Participation Level: Active  Behavioral Response: CasualAlertAnxious and Depressed  Type of Therapy: Group Therapy  Treatment Goals addressed: Coping  Progress Towards Goals: Progressing  Interventions: CBT, DBT,  Supportive, and Reframing  Therapist Response: Clinician led discussion on time and the role it plays in recovery and healing. Cln drew on connection to physical health and the time it would take to heal a broken bone or from the flu. Group  discusses ways in which the time element of recovery is difficult for them and can lead to increased anxiety and self-doubt.   Summary: Pt able to process and make connections in discussion.       Session Time: 11:00 am - 12:00 pm  Participation Level: Active  Behavioral Response: CasualAlertAnxious  Type of Therapy: Group Therapy  Treatment Goals addressed: Coping  Progress Towards Goals: Progressing  Interventions: CBT, DBT, Solution Focused, Strength-based, Supportive, and Reframing  Therapist Response: Cln introduced topic of DBT distress tolerance skills. Cln provided context for distress tolerance skills and how to practice them. Cln introduced the ACCEPTS distraction skills and group discusses ways to utilize A C and C activities.   Summary: Pt engaged in discussion and is able to state ways to apply this skill.  **Pt chose to leave group at 11:45 stating he does not feel physically well enough to finish the group session. Pt reports he is safe to drive home and that he will be in group tomorrow. Pt denies SI/HI before leaving.   ?    Suicidal/Homicidal: Nowithout intent/plan  Plan: Pt will continue in PHP while working to decrease depression symptoms, increase daily functioning, and increase ability to manage symptoms in a healthy manner.   Collaboration of Care: Medication Management AEB T Lewis, NP  Patient/Guardian was advised Release of Information must be obtained prior to any record release in order to collaborate their care with an outside provider. Patient/Guardian was advised if they have not already done so to contact the registration department to sign all necessary forms in order for us  to release information regarding their care.   Consent:  Patient/Guardian gives verbal consent for treatment and assignment of benefits for services provided during this visit. Patient/Guardian expressed understanding and agreed to proceed.   Diagnosis: Bipolar I  disorder, most recent episode depressed (HCC) [F31.30]    1. Bipolar I disorder, most recent episode depressed (HCC)       Randall Bastos, LCSW 04/21/2024

## 2024-04-22 ENCOUNTER — Ambulatory Visit (HOSPITAL_COMMUNITY)

## 2024-04-22 ENCOUNTER — Encounter (HOSPITAL_COMMUNITY): Payer: Self-pay

## 2024-04-22 NOTE — Therapy (Signed)
 [facility] [address] [place], [place], [address] Phone: [phone]   Fax:  [phone]  Occupational Therapy Treatment  Patient Details  Name: Alexander Harrington MRN: [id number] Date of Birth: Sep 19, 1990 No data recorded  Encounter Date: 04/13/2024   OT End of Session - 04/22/24 1114     Visit Number 8    Number of Visits 20    Date for Recertification  04/20/24    OT Start Time 1200    OT Stop Time 1300    OT Time Calculation (min) 60 min    Activity Tolerance Patient tolerated treatment well    Behavior During Therapy [facility] for tasks assessed/performed          Past Medical History:  Diagnosis Date   ADHD    Anxiety    Dr. [name]   Bipolar 2 disorder (HCC)    Depression    HLD (hyperlipidemia)    Insomnia    Sleep apnea    do not use CPAP    Past Surgical History:  Procedure Laterality Date   DRUG INDUCED ENDOSCOPY N/A 08/28/2022   Procedure: DRUG INDUCED ENDOSCOPY;  Surgeon: Carlie Clark, MD;  [facility];  Service: ENT;  Laterality: N/A;   OTHER SURGICAL HISTORY     sedated ect treatment   TONSILLECTOMY Bilateral 11/07/2022   Procedure: TONSILLECTOMY;  Surgeon: Carlie Clark, MD;  Location: [facility] OR;  Service: ENT;  Laterality: Bilateral;    There were no vitals filed for this visit.   Subjective Assessment - 04/22/24 1114     Currently in Pain? No/denies    Pain Score 0-No pain             Group Session:  S: Doing better now. Less anxiety since getting back from my trip  O: Patient participated in Meaningful Leisure and Role Building exercise. Activity involved selecting and planning a preferred leisure or role task, practicing task steps, and reflecting on barriers and successes. Goal was to improve initiation, sustained attention, sequencing, problem solving, and role performance to support daily functioning and occupational balance.   A: Patient independently initiated and sustained  participation in Meaningful Leisure activity for the duration of session. Followed multi-step directions, contributed ideas, and demonstrated appropriate affect.   P: Continue to attend PHP OT group sessions 5x week for 4 weeks to promote daily structure, social engagement, and opportunities to develop and utilize adaptive strategies to maximize functional performance in preparation for safe transition and integration back into school, work, and the community. Plan to address topic of Leisure and Role building cont'd in next OT group session.                   OT Education - 04/22/24 1114     Education Details Meaningful Leisure and Role Building    Person(s) Educated Patient    Methods Handout;Explanation    Comprehension Verbalized understanding           OT Short Term Goals - 04/20/24 0952       OT SHORT TERM GOAL #1   Title Patient will be educated on strategies to improve psychosocial skills needed to participate fully in all daily, work, and leisure activities.    Time 4    Period Weeks    Status On-going    Target Date 08/15/17      OT SHORT TERM GOAL #2   Title Patient will be educated on a HEP and independent with implementation  of HEP.    Status On-going      OT SHORT TERM GOAL #3   Title Patient will independently apply psychosocial skills and coping mechanisms to her daily activities in order to function independently.    Status On-going                   Plan - 04/22/24 1115     Occupational performance deficits (Please refer to evaluation for details): Social Participation;IADL's;Rest and Sleep;Work;Leisure    Cognitive Skills Attention    Psychosocial Skills Coping Strategies;Habits;Interpersonal Interaction;Routines and Behaviors          Patient will benefit from skilled therapeutic intervention in order to improve the following deficits and impairments:     Cognitive Skills: Attention Psychosocial Skills: Coping Strategies,  Habits, Interpersonal Interaction, Routines and Behaviors   Visit Diagnosis: Difficulty coping    Problem List Patient Active Problem List   Diagnosis Date Noted   Migraine 01/06/2024   Suicide attempt (HCC) 11/17/2022   OSA (obstructive sleep apnea) 11/07/2022   Hyperlipidemia 06/03/2020   Bipolar I disorder, most recent episode depressed (HCC) 08/07/2017    Class: Chronic   Hoarseness of voice 10/04/2015   Tachycardia 08/30/2015   Leukocytosis 02/08/2015   Other specified hypothyroidism 02/08/2015   MDD (major depressive disorder), recurrent episode, severe (HCC) 12/01/2013   Substance induced mood disorder (HCC) 04/11/2012    Class: Acute   Severe episode of recurrent major depressive disorder (HCC) 04/09/2012    Class: Acute   Keratosis pilaris 02/21/2011    D[place] KANDICE Purpura, OT 04/22/2024, 11:15 AM  [name], OT   [facility] [address] [place], [place], [address] Phone: [phone]   Fax:  [phone]  Name: Alexander Harrington MRN: [id number] Date of Birth: Oct 16, 1990

## 2024-04-22 NOTE — Therapy (Signed)
 [facility] [address] [place], [place], [address] Phone: [phone]   Fax:  [phone]  Occupational Therapy Treatment  Patient Details  Name: Alexander [facility] MRN: [id number] Date of Birth: 1990-11-03 No data recorded  Encounter Date: 04/03/2024   OT End of Session - 04/22/24 0936     Visit Number 7    Number of Visits 20    Date for Recertification  04/20/24    OT Start Time 1200    OT Stop Time 1250    OT Time Calculation (min) 50 min    Activity Tolerance Patient tolerated treatment well    Behavior During Therapy [facility] for tasks assessed/performed          Past Medical History:  Diagnosis Date   ADHD    Anxiety    Dr. [name]   Bipolar 2 disorder (HCC)    Depression    HLD (hyperlipidemia)    Insomnia    Sleep apnea    do not use CPAP    Past Surgical History:  Procedure Laterality Date   DRUG INDUCED ENDOSCOPY N/A 08/28/2022   Procedure: DRUG INDUCED ENDOSCOPY;  Surgeon: Carlie Clark, MD;  [facility]: [facility];  Service: ENT;  Laterality: N/A;   OTHER SURGICAL HISTORY     sedated ect treatment   TONSILLECTOMY Bilateral 11/07/2022   Procedure: TONSILLECTOMY;  Surgeon: Carlie Clark, MD;  [facility];  Service: ENT;  Laterality: Bilateral;    There were no vitals filed for this visit.   Subjective Assessment - 04/22/24 0935     Currently in Pain? No/denies    Pain Score 0-No pain            Group Session:  S: Doing a little better today. Nervous about next week.   O: During today's OT group session, the patient participated in an educational segment about the importance of goal-setting and the application of the SMART framework to enhance daily life, particularly focusing on ADLs and iADLs. The session began with five open-ended pre-session questions that facilitated group discussion and introspection about their current relationship with goals. Following the introduction and educational segment,  participants engaged in brainstorming and group discussions to devise hypothetical SMART goals. The session concluded with five post-session questions to reinforce understanding and facilitate reflection. Throughout the session, there was a range of engagement levels noted among the participants.   A:  Patient demonstrated a high level of engagement throughout the session. They actively participated in discussions, sharing personal experiences related to goal setting and challenges faced. Patient was able to clearly articulate an understanding of the SMART framework and proposed personal SMART goals related to their own ADLs with minimal assistance. They expressed enthusiasm about applying what they learned to their daily routine and appeared motivated to make changes.    P: Continue to attend PHP OT group sessions 5x week for 4 weeks to promote daily structure, social engagement, and opportunities to develop and utilize adaptive strategies to maximize functional performance in preparation for safe transition and integration back into school, work, and the community. Plan to address topic of TBD in next OT group session.                    OT Education - 04/22/24 0935     Education Details SMART Goals           OT Short Term Goals - 04/20/24 0952       OT SHORT TERM  GOAL #1   Title Patient will be educated on strategies to improve psychosocial skills needed to participate fully in all daily, work, and leisure activities.    Time 4    Period Weeks    Status On-going    Target Date 08/15/17      OT SHORT TERM GOAL #2   Title Patient will be educated on a HEP and independent with implementation of HEP.    Status On-going      OT SHORT TERM GOAL #3   Title Patient will independently apply psychosocial skills and coping mechanisms to her daily activities in order to function independently.    Status On-going                   Plan - 04/22/24 9062      Cognitive Skills Attention    Psychosocial Skills Coping Strategies;Habits;Interpersonal Interaction;Routines and Behaviors          Patient will benefit from skilled therapeutic intervention in order to improve the following deficits and impairments:     Cognitive Skills: Attention Psychosocial Skills: Coping Strategies, Habits, Interpersonal Interaction, Routines and Behaviors   Visit Diagnosis: Difficulty coping    Problem List Patient Active Problem List   Diagnosis Date Noted   Migraine 01/06/2024   Suicide attempt (HCC) 11/17/2022   OSA (obstructive sleep apnea) 11/07/2022   Hyperlipidemia 06/03/2020   Bipolar I disorder, most recent episode depressed (HCC) 08/07/2017    Class: Chronic   Hoarseness of voice 10/04/2015   Tachycardia 08/30/2015   Leukocytosis 02/08/2015   Other specified hypothyroidism 02/08/2015   MDD (major depressive disorder), recurrent episode, severe (HCC) 12/01/2013   Substance induced mood disorder (HCC) 04/11/2012    Class: Acute   Severe episode of recurrent major depressive disorder (HCC) 04/09/2012    Class: Acute   Keratosis pilaris 02/21/2011    [facility], OT 04/22/2024, 9:38 AM  [facility], OT   [facility] [address] [place], [place], [address] Phone: [phone]   Fax:  [phone]  Name: Alexander [facility]rington MRN: [id number] Date of Birth: Dec 26, 1990

## 2024-04-23 ENCOUNTER — Encounter (HOSPITAL_COMMUNITY): Payer: Self-pay

## 2024-04-23 ENCOUNTER — Ambulatory Visit (HOSPITAL_COMMUNITY)

## 2024-04-23 ENCOUNTER — Ambulatory Visit: Admitting: Family Medicine

## 2024-04-23 NOTE — Therapy (Signed)
 [facility] [address] [place], [place], [address] Phone: [phone]   Fax:  [phone]  Occupational Therapy Treatment  Patient Details  Name: Alexander [facility] MRN: [id number] Date of Birth: 1991-05-28 No data recorded  Encounter Date: 04/16/2024   OT End of Session - 04/23/24 2154     Visit Number 9    Number of Visits 20    Date for Recertification  04/20/24    OT Start Time 1230    OT Stop Time 1330    OT Time Calculation (min) 60 min    Activity Tolerance Patient tolerated treatment well    Behavior During Therapy [facility] for tasks assessed/performed          Past Medical History:  Diagnosis Date   ADHD    Anxiety    Dr. [name]   Bipolar 2 disorder (HCC)    Depression    HLD (hyperlipidemia)    Insomnia    Sleep apnea    do not use CPAP    Past Surgical History:  Procedure Laterality Date   DRUG INDUCED ENDOSCOPY N/A 08/28/2022   Procedure: DRUG INDUCED ENDOSCOPY;  Surgeon: Carlie Clark, MD;  Location: [facility];  Service: ENT;  Laterality: N/A;   OTHER SURGICAL HISTORY     sedated ect treatment   TONSILLECTOMY Bilateral 11/07/2022   Procedure: TONSILLECTOMY;  Surgeon: Carlie Clark, MD;  [facility];  Service: ENT;  Laterality: Bilateral;    There were no vitals filed for this visit.   Subjective Assessment - 04/23/24 2153     Currently in Pain? No/denies    Pain Score 0-No pain               Group Session:  S: Feeling better today.   O: The objective of today's group is to provide a comprehensive understanding of the concept of motivation and its role in human behavior and well-being. The content covers various theories of motivation, including intrinsic and extrinsic motivators, and explores the psychological mechanisms that drive individuals to achieve goals, overcome obstacles, and make decisions. By diving into real-world applications, the group aims to offer actionable strategies for  enhancing motivation in different life domains, such as work, relationships, and personal growth.  Utilizing a multi-disciplinary approach, this group integrates insights from psychology, neuroscience, and behavioral economics to present a holistic view of motivation. The objective is not only to educate the audience about the complexities and driving forces behind motivation but also to equip them with practical tools and techniques to improve their own motivation levels. By the end of this multi-day group, patient's should have a well-rounded understanding of what motivates human actions and how to harness this knowledge for personal and professional betterment.   A: The patient demonstrates a high level of engagement during the session, actively participating in discussions about motivation theories and their applicability to their own life. They show keen interest in learning new strategies to improve their motivation and even offer examples from their own experiences that align with the theories presented. Their level of self-awareness and willingness to invest in self-improvement suggest that they are well-positioned to benefit from the practical tools and techniques discussed. The patient's ability to articulate their goals and challenges further supports the likelihood of successfully implementing the strategies presented.    P: Continue to attend PHP OT group sessions 5x week for 4 weeks to promote daily structure, social engagement, and opportunities to develop and utilize adaptive strategies to maximize  functional performance in preparation for safe transition and integration back into school, work, and the community. Plan to address topic of tbd in next OT group session.                 OT Education - 04/23/24 2154     Education Details Motivation    Person(s) Educated Patient    Methods Handout;Explanation    Comprehension Verbalized understanding           OT Short  Term Goals - 04/20/24 0952       OT SHORT TERM GOAL #1   Title Patient will be educated on strategies to improve psychosocial skills needed to participate fully in all daily, work, and leisure activities.    Time 4    Period Weeks    Status On-going    Target Date 08/15/17      OT SHORT TERM GOAL #2   Title Patient will be educated on a HEP and independent with implementation of HEP.    Status On-going      OT SHORT TERM GOAL #3   Title Patient will independently apply psychosocial skills and coping mechanisms to her daily activities in order to function independently.    Status On-going                   Plan - 04/23/24 2155     Cognitive Skills Attention    Psychosocial Skills Coping Strategies;Habits;Interpersonal Interaction;Routines and Behaviors          Patient will benefit from skilled therapeutic intervention in order to improve the following deficits and impairments:     Cognitive Skills: Attention Psychosocial Skills: Coping Strategies, Habits, Interpersonal Interaction, Routines and Behaviors   Visit Diagnosis: Difficulty coping  Bipolar I disorder, most recent episode depressed [facility])    Problem List Patient Active Problem List   Diagnosis Date Noted   Migraine 01/06/2024   Suicide attempt (HCC) 11/17/2022   OSA (obstructive sleep apnea) 11/07/2022   Hyperlipidemia 06/03/2020   Bipolar I disorder, most recent episode depressed (HCC) 08/07/2017    Class: Chronic   Hoarseness of voice 10/04/2015   Tachycardia 08/30/2015   Leukocytosis 02/08/2015   Other specified hypothyroidism 02/08/2015   MDD (major depressive disorder), recurrent episode, severe (HCC) 12/01/2013   Substance induced mood disorder (HCC) 04/11/2012    Class: Acute   Severe episode of recurrent major depressive disorder (HCC) 04/09/2012    Class: Acute   Keratosis pilaris 02/21/2011    [facility], OT 04/23/2024, 9:56 PM  [facility], OT   [facility] [address] [place], [place], [address] Phone: [phone]   Fax:  [phone]  Name: Alexander [facility] MRN: [id number] Date of Birth: 1991/05/19

## 2024-04-24 ENCOUNTER — Ambulatory Visit (HOSPITAL_COMMUNITY)

## 2024-04-24 ENCOUNTER — Encounter (HOSPITAL_COMMUNITY): Payer: Self-pay

## 2024-04-24 NOTE — Therapy (Signed)
 [facility] [address] [place], [place], [address] Phone: [phone]   Fax:  [phone]  Occupational Therapy Treatment  Patient Details  Name: [facility] MRN: [id number] Date of Birth: [birth year] No data recorded  Encounter Date: 04/17/2024   OT End of Session - 04/24/24 1027     Visit Number 10    Number of Visits 20    Date for Recertification  04/20/24    OT Start Time 1200    OT Stop Time 1250    OT Time Calculation (min) 50 min          Past Medical History:  Diagnosis Date   ADHD    Anxiety    Dr. [name]   Bipolar 2 disorder (HCC)    Depression    HLD (hyperlipidemia)    Insomnia    Sleep apnea    do not use CPAP    Past Surgical History:  Procedure Laterality Date   DRUG INDUCED ENDOSCOPY N/A 08/28/2022   Procedure: DRUG INDUCED ENDOSCOPY;  Surgeon: Carlie Clark, MD;  Location: [facility];  Service: ENT;  Laterality: N/A;   OTHER SURGICAL HISTORY     sedated ect treatment   TONSILLECTOMY Bilateral 11/07/2022   Procedure: TONSILLECTOMY;  Surgeon: Carlie Clark, MD;  Location: [facility] OR;  Service: ENT;  Laterality: Bilateral;    There were no vitals filed for this visit.   Subjective Assessment - 04/24/24 1027     Currently in Pain? No/denies    Pain Score 0-No pain              Group Session:  S: Feeling better. I'm looking forward to plans I have this weekend.   O: The objective of today's group is to provide a comprehensive understanding of the concept of motivation and its role in human behavior and well-being. The content covers various theories of motivation, including intrinsic and extrinsic motivators, and explores the psychological mechanisms that drive individuals to achieve goals, overcome obstacles, and make decisions. By diving into real-world applications, the group aims to offer actionable strategies for enhancing motivation in different life domains, such as work,  relationships, and personal growth.  Utilizing a multi-disciplinary approach, this group integrates insights from psychology, neuroscience, and behavioral economics to present a holistic view of motivation. The objective is not only to educate the audience about the complexities and driving forces behind motivation but also to equip them with practical tools and techniques to improve their own motivation levels. By the end of this multi-day group, patient's should have a well-rounded understanding of what motivates human actions and how to harness this knowledge for personal and professional betterment.   A: The patient demonstrates a high level of engagement during the session, actively participating in discussions about motivation theories and their applicability to their own life. They show keen interest in learning new strategies to improve their motivation and even offer examples from their own experiences that align with the theories presented. Their level of self-awareness and willingness to invest in self-improvement suggest that they are well-positioned to benefit from the practical tools and techniques discussed. The patient's ability to articulate their goals and challenges further supports the likelihood of successfully implementing the strategies presented.   P: Continue to attend PHP OT group sessions 5x week for 4 weeks to promote daily structure, social engagement, and opportunities to develop and utilize adaptive strategies to maximize functional performance in preparation for safe transition and integration back into school, work,  and the community. Plan to address topic of tbd in next OT group session.                  OT Education - 04/24/24 1027     Education Details Motivation    Person(s) Educated Patient    Methods Handout;Explanation    Comprehension Verbalized understanding           OT Short Term Goals - 04/20/24 0952       OT SHORT TERM GOAL #1    Title Patient will be educated on strategies to improve psychosocial skills needed to participate fully in all daily, work, and leisure activities.    Time 4    Period Weeks    Status On-going    Target Date 08/15/17      OT SHORT TERM GOAL #2   Title Patient will be educated on a HEP and independent with implementation of HEP.    Status On-going      OT SHORT TERM GOAL #3   Title Patient will independently apply psychosocial skills and coping mechanisms to her daily activities in order to function independently.    Status On-going                   Plan - 04/24/24 1027     Occupational performance deficits (Please refer to evaluation for details): Social Participation;IADL's;Rest and Sleep;Work;Leisure    Cognitive Skills Attention    Psychosocial Skills Coping Strategies;Habits;Interpersonal Interaction;Routines and Behaviors          Patient will benefit from skilled therapeutic intervention in order to improve the following deficits and impairments:     Cognitive Skills: Attention Psychosocial Skills: Coping Strategies, Habits, Interpersonal Interaction, Routines and Behaviors   Visit Diagnosis: Difficulty coping    Problem List Patient Active Problem List   Diagnosis Date Noted   Migraine 01/06/2024   Suicide attempt (HCC) 11/17/2022   OSA (obstructive sleep apnea) 11/07/2022   Hyperlipidemia 06/03/2020   Bipolar I disorder, most recent episode depressed (HCC) 08/07/2017    Class: Chronic   Hoarseness of voice 10/04/2015   Tachycardia 08/30/2015   Leukocytosis 02/08/2015   Other specified hypothyroidism 02/08/2015   MDD (major depressive disorder), recurrent episode, severe (HCC) 12/01/2013   Substance induced mood disorder (HCC) 04/11/2012    Class: Acute   Severe episode of recurrent major depressive disorder (HCC) 04/09/2012    Class: Acute   Keratosis pilaris 02/21/2011    [place] KANDICE Purpura, OT 04/24/2024, 10:28 AM  [name],  OT   [facility] [address] [place], [place], [address] Phone: [phone]   Fax:  [phone]  Name: [facility] MRN: [id number] Date of Birth: May 16, 1991

## 2024-05-05 ENCOUNTER — Encounter: Payer: Self-pay | Admitting: Physician Assistant

## 2024-05-05 ENCOUNTER — Ambulatory Visit: Payer: MEDICAID | Admitting: Physician Assistant

## 2024-05-05 VITALS — BP 124/82 | HR 69

## 2024-05-05 DIAGNOSIS — L720 Epidermal cyst: Secondary | ICD-10-CM

## 2024-05-05 DIAGNOSIS — L905 Scar conditions and fibrosis of skin: Secondary | ICD-10-CM

## 2024-05-05 NOTE — Patient Instructions (Signed)

## 2024-05-05 NOTE — Progress Notes (Signed)
   New Patient Visit   Subjective  [facility] is a 33 y.o. male NEW PATIENT who presents for the following: Scar on face and cyst on neck  Patient states he  has scar located at the face  and cyst of the neck that he  would like to have examined. Patient reports the areas have been there for 1 year for both areas. He reports the areas are not bothersome. He states that the areas have not spread. Patient reports he  has not previously been treated for these areas. Patient denies Hx of bx. Patient denies family history of cyst.    The following portions of the chart were reviewed this encounter and updated as appropriate: medications, allergies, medical history  Review of Systems:  No other skin or systemic complaints except as noted in HPI or Assessment and Plan.  Objective  Well appearing patient in no apparent distress; mood and affect are within normal limits.  A focused examination was performed of the following areas: Face and neck   Relevant exam findings are noted in the Assessment and Plan.           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  I, Doyce Pan, CMA, am acting as scribe for Jhonathan Desroches K, PA-C.   Documentation: I have reviewed the above documentation for accuracy and completeness, and I agree with the above.  Tymeka Privette K, PA-C

## 2024-06-03 ENCOUNTER — Institutional Professional Consult (permissible substitution): Payer: MEDICAID

## 2024-06-08 ENCOUNTER — Institutional Professional Consult (permissible substitution): Payer: MEDICAID

## 2024-06-11 ENCOUNTER — Institutional Professional Consult (permissible substitution): Payer: MEDICAID

## 2024-07-28 NOTE — Progress Notes (Signed)
 [facility] [address] [place], [place] [address] [phone]  Date:  07/30/2024   Name:  Alexander Harrington   DOB:  [birth year]   MRN:  [id number]  PCP:  Watt Harlene BROCKS, MD    Chief Complaint: Migraine (Patient has been having frequent migraines. UC said he had an ear infection and vertigo. Patient also experiences dizziness/lightheadedness and off-balance.), Emesis (Patient has been experiencing vomiting for a while, caused by medication), and Fatigue (Patient's energy levels have decreased recently and feels very tired and fatigued)   History of Present Illness:  Alexander Harrington is a 34 y.o. very pleasant male patient who presents with the following:  Patient seen today for concern of migraines and vertigo.  I saw him most recently in August to follow-up from a recent mental health crisis.  He was visiting a friend in [place]  when he had a break in his mental health leading to a violent incident, and then he was arrested His mother died about 2 years ago which has been really hard Accompanied today by his father  Discussed the use of AI scribe software for clinical note transcription with the patient, who gave verbal consent to proceed.  History of Present Illness Alexander Harrington is a 34 year old male with migraines and sleep apnea who presents with recurrent migraines and associated symptoms.  He experiences recurrent migraines that have become more persistent. An urgent care visit before Christmas led to a diagnosis of an ear infection, for which he was prescribed amoxicillin . He has been experiencing severe vertigo, for which he was prescribed meclizine . He also has nausea, a bad taste in his mouth, and vomiting, particularly in the evenings, regardless of food intake.  He reports that during his ENT visit on New Year's Eve, he was told his ear was okay. The migraines are debilitating, affecting his ability to  drive, perform errands, and engage in cognitive tasks. He experiences confusion and uses ice packs to alleviate symptoms. He has tried sumatriptan  and rizatriptan  in the past, which were somewhat effective.  He has a history of sleep apnea and had his tonsils removed, which reduced his snoring. Sedating medications exacerbate his snoring. He experiences fatigue and depression, which limit his mobility. Increased activity, such as driving or shopping, triggers migraine symptoms. He experiences light sensitivity and describes the headaches as more than just severe headaches, aligning with migraine symptoms.  He has been experiencing gastrointestinal issues, including nausea, vomiting, cramps, and diarrhea for the past couple of months. He has been using Imodium and Pepto Bismol to manage these symptoms. Zofran  helps with nausea and vomiting.  He has a history of low vitamin B12 and vitamin D  levels. He is not currently taking B12 supplements and does not follow a vegetarian diet. He is concerned about his vitamin levels due to his exhaustion.     Patient Active Problem List   Diagnosis Date Noted   Migraine 01/06/2024   Suicide attempt (HCC) 11/17/2022   OSA (obstructive sleep apnea) 11/07/2022   Hyperlipidemia 06/03/2020   Bipolar I disorder, most recent episode depressed (HCC) 08/07/2017    Class: Chronic   Hoarseness of voice 10/04/2015   Tachycardia 08/30/2015   Leukocytosis 02/08/2015   Other specified hypothyroidism 02/08/2015   MDD (major depressive disorder), recurrent episode, severe (HCC) 12/01/2013   Substance induced mood disorder (HCC) 04/11/2012    Class: Acute   Severe episode of recurrent major depressive  disorder (HCC) 04/09/2012    Class: Acute   Keratosis pilaris 02/21/2011    Past Medical History:  Diagnosis Date   ADHD    Anxiety    Dr. [name]   Bipolar 2 disorder (HCC)    Depression    HLD (hyperlipidemia)    Insomnia    Sleep apnea    do not use CPAP     Past Surgical History:  Procedure Laterality Date   DRUG INDUCED ENDOSCOPY N/A 08/28/2022   Procedure: DRUG INDUCED ENDOSCOPY;  Surgeon: Carlie Clark, MD;  Location: [facility];  Service: ENT;  Laterality: N/A;   OTHER SURGICAL HISTORY     sedated ect treatment   TONSILLECTOMY Bilateral 11/07/2022   Procedure: TONSILLECTOMY;  Surgeon: Carlie Clark, MD;  Location: [facility] OR;  Service: ENT;  Laterality: Bilateral;    Social History[1]  Family History  Problem Relation Age of Onset   Arthritis Mother    Mental illness Mother    Depression Mother    Anxiety disorder Mother    Drug abuse Maternal Grandfather    Alcohol  abuse Maternal Grandfather    Drug abuse Maternal Grandmother    Alcohol  abuse Maternal Grandmother     Allergies[2]  Medication list has been reviewed and updated.  Medications Ordered Prior to Encounter[3]  Review of Systems:  As per HPI- otherwise negative.   Physical Examination: Vitals:   07/30/24 1112  BP: 128/82  Pulse: 89  Resp: 16  Temp: (!) 97.5 F (36.4 C)  SpO2: 97%   Vitals:   07/30/24 1112  Weight: 234 lb 12.8 oz (106.5 kg)  Height: 5' 10 (1.778 m)   Body mass index is 33.69 kg/m. Ideal Body Weight: Weight in (lb) to have BMI = 25: 173.9  GEN: no acute distress.  Mild obesity, looks well HEENT: Atraumatic, Normocephalic.  Despite tonsillectomy he still has a small posterior oropharynx which may predispose to snoring Ears and Nose: No external deformity. CV: RRR, No M/G/R. No JVD. No thrill. No extra heart sounds. PULM: CTA B, no wheezes, crackles, rhonchi. No retractions. No resp. distress. No accessory muscle use. ABD: S, NT, ND, +BS. No rebound. No HSM. EXTR: No c/c/e PSYCH: Normally interactive. Conversant.  Normal strength, sensation, DTR of all extremities  Assessment and Plan: Chronic vomiting - Plan: CBC, Comprehensive metabolic panel with GFR, ondansetron  (ZOFRAN -ODT) 8 MG disintegrating tablet,  Ambulatory referral to Gastroenterology  Other migraine without status migrainosus, not intractable - Plan: Rimegepant Sulfate (NURTEC) 75 MG TBDP, Ambulatory referral to Neurology  Screening for diabetes mellitus - Plan: Hemoglobin A1c  Screening, lipid - Plan: Lipid panel  Chronic fatigue - Plan: TSH, Ferritin  B12 deficiency - Plan: Vitamin B12  Vitamin D  deficiency - Plan: VITAMIN D  25 Hydroxy (Vit-D Deficiency, Fractures)  Assessment & Plan Migraine with associated vertigo and chronic vomiting Recurrent migraines with vertigo and chronic vomiting. Previous treatments with sumatriptan  and rizatriptan  were somewhat effective but did not completely control headaches. ENT evaluation suggested migraine-related issues. Neurology referral warranted. Nurtec considered as new treatment option, insurance coverage may be a barrier. - Prescribed Nurtec every other day for a month as preventative. - Referred to neurology for further evaluation and management. - Refilled Zofran  for nausea and vomiting. - Referred to gastroenterology for evaluation of chronic vomiting.  Obstructive sleep apnea Previously diagnosed. Tonsillectomy has improved symptoms. Sleep apnea may be exacerbated by sedating medications. Further evaluation needed-Dr. Carlie has already ordered a repeat sleep study  Chronic fatigue Possibly related  to obstructive sleep apnea and mental health issues. Previous sleep studies conducted before tonsillectomy. Further evaluation needed. - Ordered routine blood work including blood counts, metabolic panel, blood sugar, cholesterol, thyroid , vitamin D , vitamin B12, and iron levels.  Vitamin B12 deficiency Noted in the past. Currently not taking B12 supplements. - Ordered vitamin B12 level as part of routine blood work.  Vitamin D  deficiency Noted in the past. - Ordered vitamin D  level as part of routine blood work.  General health maintenance Flu vaccination is due. -  Administered flu shot.  Signed Harlene Schroeder, MD  Received labs, message to patient  Results for orders placed or performed in visit on 07/30/24  VITAMIN D  25 Hydroxy (Vit-D Deficiency, Fractures)   Collection Time: 07/30/24 12:02 PM  Result Value Ref Range   VITD 17.08 (L) 30.00 - 100.00 ng/mL  Vitamin B12   Collection Time: 07/30/24 12:02 PM  Result Value Ref Range   Vitamin B-12 256 211 - 911 pg/mL  CBC   Collection Time: 07/30/24 12:02 PM  Result Value Ref Range   WBC 9.9 4.0 - 10.5 K/uL   RBC 5.17 4.22 - 5.81 Mil/uL   Platelets 338.0 150.0 - 400.0 K/uL   Hemoglobin 14.6 13.0 - 17.0 g/dL   HCT 56.5 60.9 - 47.9 %   MCV 83.8 78.0 - 100.0 fl   MCHC 33.6 30.0 - 36.0 g/dL   RDW 86.8 88.4 - 84.4 %  Comprehensive metabolic panel with GFR   Collection Time: 07/30/24 12:02 PM  Result Value Ref Range   Sodium 138 135 - 145 mEq/L   Potassium 4.1 3.5 - 5.1 mEq/L   Chloride 104 96 - 112 mEq/L   CO2 28 19 - 32 mEq/L   Glucose, Bld 94 70 - 99 mg/dL   BUN 10 6 - 23 mg/dL   Creatinine, Ser 9.28 0.40 - 1.50 mg/dL   Total Bilirubin 0.4 0.2 - 1.2 mg/dL   Alkaline Phosphatase 36 (L) 39 - 117 U/L   AST 19 5 - 37 U/L   ALT 32 3 - 53 U/L   Total Protein 7.1 6.0 - 8.3 g/dL   Albumin 4.6 3.5 - 5.2 g/dL   GFR 879.54 >39.99 mL/min   Calcium  9.7 8.4 - 10.5 mg/dL  TSH   Collection Time: 07/30/24 12:02 PM  Result Value Ref Range   TSH 1.04 0.35 - 5.50 uIU/mL  Hemoglobin A1c   Collection Time: 07/30/24 12:02 PM  Result Value Ref Range   Hgb A1c MFr Bld 5.1 4.6 - 6.5 %  Lipid panel   Collection Time: 07/30/24 12:02 PM  Result Value Ref Range   Cholesterol 185 28 - 200 mg/dL   Triglycerides 613.9 (H) 10.0 - 149.0 mg/dL   HDL 59.09 >60.99 mg/dL   VLDL 22.7 (H) 0.0 - 59.9 mg/dL   LDL Cholesterol 67 10 - 99 mg/dL   Total CHOL/HDL Ratio 5    NonHDL 144.44   Ferritin   Collection Time: 07/30/24 12:02 PM  Result Value Ref Range   Ferritin 79.7 22.0 - 322.0 ng/mL        [1]   Social History Tobacco Use   Smoking status: Never   Smokeless tobacco: Never   Tobacco comments:    Patient does vape occasionally.  No nicotine   Vaping Use   Vaping status: Never Used  Substance Use Topics   Alcohol  use: Yes    Alcohol /week: 2.0 - 4.0 standard drinks of alcohol     Types: 2 -  4 Cans of beer per week    Comment: weekly   Drug use: Yes    Frequency: 1.0 times per week    Types: Marijuana    Comment: Last use 11/05/22  [2]  Allergies Allergen Reactions   Codeine Rash  [3]  Current Outpatient Medications on File Prior to Visit  Medication Sig Dispense Refill   clonazePAM  (KLONOPIN ) 1 MG tablet Take 1 mg by mouth 3 (three) times daily as needed for anxiety.     ibuprofen  (ADVIL ) 600 MG tablet Take 1 tablet (600 mg total) by mouth every 6 (six) hours as needed. 30 tablet 0   lithium  carbonate (LITHOBID ) 300 MG ER tablet Take 1,200 mg by mouth daily.     LYBALVI 20-10 MG TABS Take 1 tablet by mouth at bedtime.     prazosin (MINIPRESS) 1 MG capsule Take 1 mg by mouth at bedtime.     propranolol  (INDERAL ) 10 MG tablet Take 10 mg by mouth 2 (two) times daily as needed.     QUEtiapine Fumarate (SEROQUEL XR) 150 MG 24 hr tablet Take 200 mg by mouth.     rizatriptan  (MAXALT ) 10 MG tablet Take 1 tablet (10 mg total) by mouth as needed for migraine. May repeat in 2 hours if needed 10 tablet 0   rosuvastatin  (CRESTOR ) 10 MG tablet Take 1 tablet (10 mg total) by mouth daily. 90 tablet 3   VYVANSE 30 MG capsule Take 30 mg by mouth daily.     zolpidem  (AMBIEN ) 10 MG tablet Take 10 mg by mouth at bedtime.     baclofen  (LIORESAL ) 10 MG tablet Take 1 tablet (10 mg total) by mouth 3 (three) times daily. (Patient not taking: Reported on 07/30/2024) 30 each 0   eszopiclone (LUNESTA) 2 MG TABS tablet Take 2 mg by mouth at bedtime as needed for sleep. Take immediately before bedtime (Patient not taking: Reported on 07/30/2024)     OLANZapine (ZYPREXA) 5 MG tablet Take 5 mg by mouth every  morning. (Patient not taking: Reported on 07/30/2024)     Suvorexant (BELSOMRA PO) Take by mouth. (Patient not taking: Reported on 07/30/2024)     No current facility-administered medications on file prior to visit.   "

## 2024-07-30 ENCOUNTER — Ambulatory Visit (INDEPENDENT_AMBULATORY_CARE_PROVIDER_SITE_OTHER): Payer: MEDICAID | Admitting: Family Medicine

## 2024-07-30 ENCOUNTER — Encounter: Payer: Self-pay | Admitting: Family Medicine

## 2024-07-30 VITALS — BP 128/82 | HR 89 | Temp 97.5°F | Resp 16 | Ht 70.0 in | Wt 234.8 lb

## 2024-07-30 DIAGNOSIS — Z13220 Encounter for screening for lipoid disorders: Secondary | ICD-10-CM | POA: Diagnosis not present

## 2024-07-30 DIAGNOSIS — R5382 Chronic fatigue, unspecified: Secondary | ICD-10-CM

## 2024-07-30 DIAGNOSIS — E559 Vitamin D deficiency, unspecified: Secondary | ICD-10-CM | POA: Diagnosis not present

## 2024-07-30 DIAGNOSIS — Z131 Encounter for screening for diabetes mellitus: Secondary | ICD-10-CM

## 2024-07-30 DIAGNOSIS — R1110 Vomiting, unspecified: Secondary | ICD-10-CM

## 2024-07-30 DIAGNOSIS — G43809 Other migraine, not intractable, without status migrainosus: Secondary | ICD-10-CM

## 2024-07-30 DIAGNOSIS — E538 Deficiency of other specified B group vitamins: Secondary | ICD-10-CM | POA: Diagnosis not present

## 2024-07-30 LAB — VITAMIN D 25 HYDROXY (VIT D DEFICIENCY, FRACTURES): VITD: 17.08 ng/mL — ABNORMAL LOW (ref 30.00–100.00)

## 2024-07-30 LAB — CBC
HCT: 43.4 % (ref 39.0–52.0)
Hemoglobin: 14.6 g/dL (ref 13.0–17.0)
MCHC: 33.6 g/dL (ref 30.0–36.0)
MCV: 83.8 fl (ref 78.0–100.0)
Platelets: 338 K/uL (ref 150.0–400.0)
RBC: 5.17 Mil/uL (ref 4.22–5.81)
RDW: 13.1 % (ref 11.5–15.5)
WBC: 9.9 K/uL (ref 4.0–10.5)

## 2024-07-30 LAB — COMPREHENSIVE METABOLIC PANEL WITH GFR
ALT: 32 U/L (ref 3–53)
AST: 19 U/L (ref 5–37)
Albumin: 4.6 g/dL (ref 3.5–5.2)
Alkaline Phosphatase: 36 U/L — ABNORMAL LOW (ref 39–117)
BUN: 10 mg/dL (ref 6–23)
CO2: 28 meq/L (ref 19–32)
Calcium: 9.7 mg/dL (ref 8.4–10.5)
Chloride: 104 meq/L (ref 96–112)
Creatinine, Ser: 0.71 mg/dL (ref 0.40–1.50)
GFR: 120.45 mL/min
Glucose, Bld: 94 mg/dL (ref 70–99)
Potassium: 4.1 meq/L (ref 3.5–5.1)
Sodium: 138 meq/L (ref 135–145)
Total Bilirubin: 0.4 mg/dL (ref 0.2–1.2)
Total Protein: 7.1 g/dL (ref 6.0–8.3)

## 2024-07-30 LAB — LIPID PANEL
Cholesterol: 185 mg/dL (ref 28–200)
HDL: 40.9 mg/dL
LDL Cholesterol: 67 mg/dL (ref 10–99)
NonHDL: 144.44
Total CHOL/HDL Ratio: 5
Triglycerides: 386 mg/dL — ABNORMAL HIGH (ref 10.0–149.0)
VLDL: 77.2 mg/dL — ABNORMAL HIGH (ref 0.0–40.0)

## 2024-07-30 LAB — FERRITIN: Ferritin: 79.7 ng/mL (ref 22.0–322.0)

## 2024-07-30 LAB — HEMOGLOBIN A1C: Hgb A1c MFr Bld: 5.1 % (ref 4.6–6.5)

## 2024-07-30 LAB — VITAMIN B12: Vitamin B-12: 256 pg/mL (ref 211–911)

## 2024-07-30 LAB — TSH: TSH: 1.04 u[IU]/mL (ref 0.35–5.50)

## 2024-07-30 MED ORDER — NURTEC 75 MG PO TBDP: ORAL_TABLET | ORAL | 1 refills | Status: AC

## 2024-07-30 MED ORDER — MECLIZINE HCL 25 MG PO TABS: 25.0000 mg | ORAL_TABLET | Freq: Three times a day (TID) | ORAL | 1 refills | Status: AC | PRN

## 2024-07-30 MED ORDER — ONDANSETRON 8 MG PO TBDP: 8.0000 mg | ORAL_TABLET | Freq: Every day | ORAL | 2 refills | Status: AC | PRN

## 2024-07-30 NOTE — Patient Instructions (Signed)
 I will be in touch with your labs asap Referral to  Neurology Gastroenterology  Try the nurtec every other day for headache prevention- let me know how this works out for you

## 2024-11-16 ENCOUNTER — Ambulatory Visit: Admitting: Neurology
